# Patient Record
Sex: Female | Born: 1935 | Race: Black or African American | Hispanic: No | Smoking: Never smoker
Health system: Southern US, Community
[De-identification: ages and names within clinical notes are randomized; demographics above are authoritative.]

## PROBLEM LIST (undated history)

## (undated) DIAGNOSIS — H0011 Chalazion right upper eyelid: Secondary | ICD-10-CM

## (undated) DIAGNOSIS — I509 Heart failure, unspecified: Secondary | ICD-10-CM

## (undated) DIAGNOSIS — I2510 Atherosclerotic heart disease of native coronary artery without angina pectoris: Secondary | ICD-10-CM

## (undated) DIAGNOSIS — K3189 Other diseases of stomach and duodenum: Secondary | ICD-10-CM

## (undated) DIAGNOSIS — I10 Essential (primary) hypertension: Secondary | ICD-10-CM

## (undated) DIAGNOSIS — M1990 Unspecified osteoarthritis, unspecified site: Secondary | ICD-10-CM

## (undated) DIAGNOSIS — R5383 Other fatigue: Secondary | ICD-10-CM

## (undated) DIAGNOSIS — K219 Gastro-esophageal reflux disease without esophagitis: Secondary | ICD-10-CM

## (undated) DIAGNOSIS — E119 Type 2 diabetes mellitus without complications: Secondary | ICD-10-CM

## (undated) DIAGNOSIS — E785 Hyperlipidemia, unspecified: Secondary | ICD-10-CM

## (undated) DIAGNOSIS — D649 Anemia, unspecified: Secondary | ICD-10-CM

## (undated) DIAGNOSIS — E079 Disorder of thyroid, unspecified: Secondary | ICD-10-CM

## (undated) DIAGNOSIS — I350 Nonrheumatic aortic (valve) stenosis: Secondary | ICD-10-CM

## (undated) DIAGNOSIS — F419 Anxiety disorder, unspecified: Secondary | ICD-10-CM

## (undated) DIAGNOSIS — K469 Unspecified abdominal hernia without obstruction or gangrene: Secondary | ICD-10-CM

## (undated) DIAGNOSIS — F0390 Unspecified dementia without behavioral disturbance: Secondary | ICD-10-CM

## (undated) DIAGNOSIS — IMO0001 Reserved for inherently not codable concepts without codable children: Secondary | ICD-10-CM

## (undated) DIAGNOSIS — R0600 Dyspnea, unspecified: Secondary | ICD-10-CM

## (undated) DIAGNOSIS — R011 Cardiac murmur, unspecified: Secondary | ICD-10-CM

## (undated) HISTORY — PX: KNEE SURGERY: SHX244

## (undated) HISTORY — DX: Essential (primary) hypertension: I10

## (undated) HISTORY — DX: Gastro-esophageal reflux disease without esophagitis: K21.9

## (undated) HISTORY — DX: Unspecified osteoarthritis, unspecified site: M19.90

## (undated) HISTORY — PX: OTHER SURGICAL HISTORY: SHX169

## (undated) HISTORY — DX: Atherosclerotic heart disease of native coronary artery without angina pectoris: I25.10

## (undated) HISTORY — DX: Disorder of thyroid, unspecified: E07.9

## (undated) HISTORY — DX: Hyperlipidemia, unspecified: E78.5

## (undated) HISTORY — DX: Other diseases of stomach and duodenum: K31.89

## (undated) HISTORY — DX: Type 2 diabetes mellitus without complications: E11.9

## (undated) HISTORY — DX: Unspecified abdominal hernia without obstruction or gangrene: K46.9

## (undated) HISTORY — DX: Anxiety disorder, unspecified: F41.9

## (undated) HISTORY — PX: AORTIC VALVE SURGERY: SHX549

## (undated) HISTORY — PX: CATARACT EXTRACTION: SUR2

## (undated) HISTORY — PX: TUBAL LIGATION: SHX77

## (undated) HISTORY — DX: Nonrheumatic aortic (valve) stenosis: I35.0

## (undated) HISTORY — DX: Anemia, unspecified: D64.9

## (undated) HISTORY — PX: CORONARY ARTERY BYPASS GRAFT: SHX141

---

## 1992-01-29 ENCOUNTER — Encounter: Payer: Self-pay | Admitting: Internal Medicine

## 1992-02-28 ENCOUNTER — Encounter: Payer: Self-pay | Admitting: Internal Medicine

## 1995-12-26 ENCOUNTER — Encounter: Payer: Self-pay | Admitting: Internal Medicine

## 1996-03-15 ENCOUNTER — Encounter: Payer: Self-pay | Admitting: Internal Medicine

## 1996-05-31 ENCOUNTER — Encounter: Payer: Self-pay | Admitting: Internal Medicine

## 1996-08-15 ENCOUNTER — Encounter: Payer: Self-pay | Admitting: Internal Medicine

## 1998-07-09 ENCOUNTER — Other Ambulatory Visit: Admission: RE | Admit: 1998-07-09 | Discharge: 1998-07-09 | Payer: Self-pay | Admitting: *Deleted

## 1998-08-04 ENCOUNTER — Encounter: Payer: Self-pay | Admitting: Internal Medicine

## 1999-10-18 ENCOUNTER — Encounter: Payer: Self-pay | Admitting: Internal Medicine

## 1999-10-22 ENCOUNTER — Encounter: Payer: Self-pay | Admitting: Internal Medicine

## 2000-07-06 ENCOUNTER — Other Ambulatory Visit: Admission: RE | Admit: 2000-07-06 | Discharge: 2000-07-06 | Payer: Self-pay | Admitting: Obstetrics and Gynecology

## 2001-09-10 ENCOUNTER — Encounter: Payer: Self-pay | Admitting: Family Medicine

## 2001-09-10 ENCOUNTER — Ambulatory Visit (HOSPITAL_COMMUNITY): Admission: RE | Admit: 2001-09-10 | Discharge: 2001-09-10 | Payer: Self-pay | Admitting: Family Medicine

## 2001-10-17 ENCOUNTER — Ambulatory Visit (HOSPITAL_COMMUNITY): Admission: RE | Admit: 2001-10-17 | Discharge: 2001-10-17 | Payer: Self-pay | Admitting: Family Medicine

## 2001-10-17 ENCOUNTER — Encounter: Payer: Self-pay | Admitting: Family Medicine

## 2002-05-27 ENCOUNTER — Encounter: Payer: Self-pay | Admitting: Cardiothoracic Surgery

## 2002-05-29 ENCOUNTER — Encounter: Payer: Self-pay | Admitting: Cardiothoracic Surgery

## 2002-05-29 ENCOUNTER — Inpatient Hospital Stay (HOSPITAL_COMMUNITY): Admission: RE | Admit: 2002-05-29 | Discharge: 2002-06-05 | Payer: Self-pay | Admitting: Cardiothoracic Surgery

## 2002-05-30 ENCOUNTER — Encounter: Payer: Self-pay | Admitting: Cardiothoracic Surgery

## 2002-05-31 ENCOUNTER — Encounter: Payer: Self-pay | Admitting: Cardiothoracic Surgery

## 2002-06-20 ENCOUNTER — Encounter: Payer: Self-pay | Admitting: Cardiothoracic Surgery

## 2002-06-20 ENCOUNTER — Encounter: Admission: RE | Admit: 2002-06-20 | Discharge: 2002-06-20 | Payer: Self-pay | Admitting: Cardiothoracic Surgery

## 2002-08-13 ENCOUNTER — Encounter (HOSPITAL_COMMUNITY): Admission: RE | Admit: 2002-08-13 | Discharge: 2002-09-12 | Payer: Self-pay | Admitting: *Deleted

## 2002-09-16 ENCOUNTER — Encounter (HOSPITAL_COMMUNITY): Admission: RE | Admit: 2002-09-16 | Discharge: 2002-10-16 | Payer: Self-pay | Admitting: *Deleted

## 2002-10-21 ENCOUNTER — Encounter (HOSPITAL_COMMUNITY): Admission: RE | Admit: 2002-10-21 | Discharge: 2002-11-20 | Payer: Self-pay | Admitting: *Deleted

## 2002-11-07 ENCOUNTER — Ambulatory Visit (HOSPITAL_COMMUNITY): Admission: RE | Admit: 2002-11-07 | Discharge: 2002-11-07 | Payer: Self-pay | Admitting: Family Medicine

## 2002-11-07 ENCOUNTER — Encounter: Payer: Self-pay | Admitting: Family Medicine

## 2002-11-22 ENCOUNTER — Encounter (HOSPITAL_COMMUNITY): Admission: RE | Admit: 2002-11-22 | Discharge: 2002-12-22 | Payer: Self-pay | Admitting: *Deleted

## 2003-12-01 ENCOUNTER — Encounter: Payer: Self-pay | Admitting: Internal Medicine

## 2004-01-02 ENCOUNTER — Encounter: Payer: Self-pay | Admitting: Internal Medicine

## 2004-01-14 ENCOUNTER — Encounter (INDEPENDENT_AMBULATORY_CARE_PROVIDER_SITE_OTHER): Payer: Self-pay | Admitting: *Deleted

## 2004-01-14 ENCOUNTER — Encounter: Payer: Self-pay | Admitting: Internal Medicine

## 2004-02-04 ENCOUNTER — Encounter (INDEPENDENT_AMBULATORY_CARE_PROVIDER_SITE_OTHER): Payer: Self-pay | Admitting: *Deleted

## 2004-02-04 ENCOUNTER — Ambulatory Visit (HOSPITAL_COMMUNITY): Admission: RE | Admit: 2004-02-04 | Discharge: 2004-02-04 | Payer: Self-pay | Admitting: Internal Medicine

## 2004-03-26 ENCOUNTER — Other Ambulatory Visit: Admission: RE | Admit: 2004-03-26 | Discharge: 2004-03-26 | Payer: Self-pay | Admitting: Obstetrics and Gynecology

## 2004-05-05 ENCOUNTER — Encounter (INDEPENDENT_AMBULATORY_CARE_PROVIDER_SITE_OTHER): Payer: Self-pay | Admitting: *Deleted

## 2004-05-05 ENCOUNTER — Ambulatory Visit (HOSPITAL_COMMUNITY): Admission: RE | Admit: 2004-05-05 | Discharge: 2004-05-05 | Payer: Self-pay | Admitting: Internal Medicine

## 2004-09-14 ENCOUNTER — Emergency Department (HOSPITAL_COMMUNITY): Admission: EM | Admit: 2004-09-14 | Discharge: 2004-09-14 | Payer: Self-pay | Admitting: Emergency Medicine

## 2005-04-21 ENCOUNTER — Ambulatory Visit (HOSPITAL_COMMUNITY): Admission: RE | Admit: 2005-04-21 | Discharge: 2005-04-21 | Payer: Self-pay | Admitting: Family Medicine

## 2006-06-28 ENCOUNTER — Encounter: Payer: Self-pay | Admitting: Internal Medicine

## 2006-06-28 ENCOUNTER — Ambulatory Visit: Payer: Self-pay | Admitting: Internal Medicine

## 2006-12-10 ENCOUNTER — Emergency Department (HOSPITAL_COMMUNITY): Admission: EM | Admit: 2006-12-10 | Discharge: 2006-12-10 | Payer: Self-pay | Admitting: Emergency Medicine

## 2007-02-14 ENCOUNTER — Emergency Department (HOSPITAL_COMMUNITY): Admission: EM | Admit: 2007-02-14 | Discharge: 2007-02-14 | Payer: Self-pay | Admitting: Emergency Medicine

## 2007-05-15 ENCOUNTER — Emergency Department (HOSPITAL_COMMUNITY): Admission: EM | Admit: 2007-05-15 | Discharge: 2007-05-16 | Payer: Self-pay | Admitting: Emergency Medicine

## 2007-05-24 ENCOUNTER — Emergency Department (HOSPITAL_COMMUNITY): Admission: EM | Admit: 2007-05-24 | Discharge: 2007-05-24 | Payer: Self-pay | Admitting: Emergency Medicine

## 2007-05-26 ENCOUNTER — Ambulatory Visit (HOSPITAL_COMMUNITY): Admission: RE | Admit: 2007-05-26 | Discharge: 2007-05-26 | Payer: Self-pay | Admitting: Emergency Medicine

## 2007-11-15 ENCOUNTER — Encounter: Admission: RE | Admit: 2007-11-15 | Discharge: 2007-12-05 | Payer: Self-pay | Admitting: Neurology

## 2007-12-11 ENCOUNTER — Encounter: Admission: RE | Admit: 2007-12-11 | Discharge: 2007-12-27 | Payer: Self-pay | Admitting: Neurology

## 2007-12-11 ENCOUNTER — Ambulatory Visit (HOSPITAL_COMMUNITY): Admission: RE | Admit: 2007-12-11 | Discharge: 2007-12-11 | Payer: Self-pay | Admitting: Family Medicine

## 2008-07-14 ENCOUNTER — Ambulatory Visit (HOSPITAL_COMMUNITY): Admission: RE | Admit: 2008-07-14 | Discharge: 2008-07-14 | Payer: Self-pay | Admitting: *Deleted

## 2009-06-20 ENCOUNTER — Emergency Department (HOSPITAL_COMMUNITY): Admission: EM | Admit: 2009-06-20 | Discharge: 2009-06-20 | Payer: Self-pay | Admitting: Emergency Medicine

## 2009-12-22 ENCOUNTER — Encounter (INDEPENDENT_AMBULATORY_CARE_PROVIDER_SITE_OTHER): Payer: Self-pay | Admitting: *Deleted

## 2009-12-22 ENCOUNTER — Ambulatory Visit (HOSPITAL_COMMUNITY): Admission: RE | Admit: 2009-12-22 | Discharge: 2009-12-22 | Payer: Self-pay | Admitting: Cardiology

## 2010-01-08 ENCOUNTER — Encounter (INDEPENDENT_AMBULATORY_CARE_PROVIDER_SITE_OTHER): Payer: Self-pay | Admitting: *Deleted

## 2010-02-25 DIAGNOSIS — K449 Diaphragmatic hernia without obstruction or gangrene: Secondary | ICD-10-CM | POA: Insufficient documentation

## 2010-02-25 DIAGNOSIS — E049 Nontoxic goiter, unspecified: Secondary | ICD-10-CM | POA: Insufficient documentation

## 2010-02-25 DIAGNOSIS — D649 Anemia, unspecified: Secondary | ICD-10-CM

## 2010-02-25 DIAGNOSIS — I2510 Atherosclerotic heart disease of native coronary artery without angina pectoris: Secondary | ICD-10-CM

## 2010-02-25 DIAGNOSIS — M1990 Unspecified osteoarthritis, unspecified site: Secondary | ICD-10-CM | POA: Insufficient documentation

## 2010-02-25 DIAGNOSIS — E785 Hyperlipidemia, unspecified: Secondary | ICD-10-CM | POA: Insufficient documentation

## 2010-02-25 DIAGNOSIS — F411 Generalized anxiety disorder: Secondary | ICD-10-CM

## 2010-02-25 DIAGNOSIS — Z8711 Personal history of peptic ulcer disease: Secondary | ICD-10-CM

## 2010-02-25 DIAGNOSIS — I10 Essential (primary) hypertension: Secondary | ICD-10-CM | POA: Insufficient documentation

## 2010-02-25 DIAGNOSIS — E1165 Type 2 diabetes mellitus with hyperglycemia: Secondary | ICD-10-CM

## 2010-02-25 DIAGNOSIS — K210 Gastro-esophageal reflux disease with esophagitis: Secondary | ICD-10-CM

## 2010-03-04 ENCOUNTER — Ambulatory Visit: Payer: Self-pay | Admitting: Internal Medicine

## 2010-03-04 DIAGNOSIS — I509 Heart failure, unspecified: Secondary | ICD-10-CM

## 2010-03-04 DIAGNOSIS — Z860100 Personal history of colon polyps, unspecified: Secondary | ICD-10-CM | POA: Insufficient documentation

## 2010-03-04 HISTORY — DX: Heart failure, unspecified: I50.9

## 2010-03-11 ENCOUNTER — Ambulatory Visit: Payer: Self-pay | Admitting: Internal Medicine

## 2010-03-15 ENCOUNTER — Encounter: Payer: Self-pay | Admitting: Internal Medicine

## 2010-04-06 ENCOUNTER — Ambulatory Visit (HOSPITAL_COMMUNITY): Admission: RE | Admit: 2010-04-06 | Discharge: 2010-04-06 | Payer: Self-pay | Admitting: Cardiology

## 2010-04-19 ENCOUNTER — Ambulatory Visit: Payer: Self-pay | Admitting: Internal Medicine

## 2010-04-20 ENCOUNTER — Other Ambulatory Visit: Admission: RE | Admit: 2010-04-20 | Discharge: 2010-04-20 | Payer: Self-pay | Admitting: Interventional Radiology

## 2010-04-20 ENCOUNTER — Encounter: Admission: RE | Admit: 2010-04-20 | Discharge: 2010-04-20 | Payer: Self-pay | Admitting: Family Medicine

## 2010-09-06 ENCOUNTER — Encounter: Payer: Self-pay | Admitting: Internal Medicine

## 2011-01-06 NOTE — Letter (Signed)
Summary: Diabetic Instructions  [facility]  [place], [place] [address]   Phone: [phone]  Fax: [phone]    Katherine [name] November 23, 1928 MRN: [id number]   _x  _   ORAL DIABETIC MEDICATION INSTRUCTIONS  The day before your procedure:   Take your diabetic pill as you do normally  The day of your procedure:   Do not take your diabetic pill (metformin)   We will check your blood sugar levels during the admission process and again in Recovery before discharging you home  ________________________________________________________________________  _ x _   INSULIN (LONG ACTING) MEDICATION INSTRUCTIONS (Lantus, NPH, 70/30, Humulin, Novolin-N)   The day before your procedure:   Take  your regular evening dose    The day of your procedure:   Do not take your morning dose

## 2011-01-06 NOTE — Op Note (Signed)
Summary: EGD/COLON (Dr Laural Golden)   NAME:  Katherine [name], Katherine [name]                   ACCOUNT NO.:  [id number]   MEDICAL RECORD NO.:  [id number]                   PATIENT TYPE:  AMB   LOCATION:  DAY                                  FACILITY:  [facility]   PHYSICIAN:  Hildred Laser, M.D.                 DATE OF BIRTH:  [birth year]   DATE OF PROCEDURE:  02/04/2004  DATE OF DISCHARGE:                                 OPERATIVE REPORT   PROCEDURE:  Esophagogastroduodenoscopy followed by total colonoscopy.   INDICATIONS:  Katherine [name] is a 77 year old African American female who  recently developed melena.  Her hemoglobin was 9.3.  She had been __________  which was discontinued.  It is suspected she may have peptic ulcer disease  and, therefore, undergoing diagnostic EGD.  She is also undergoing  colonoscopy primarily for screening purposes.  The procedure and risks were  reviewed with the patient and informed consent was obtained.   PREMEDICATION:  Demerol 50 mg IV,  Versed 6 mg IV.   FINDINGS:  The procedures were performed in the endoscopy suite.  The  patient's vital signs and O2 saturation were monitored during the procedure  and remained stable.   ESOPHAGOGASTRODUODENOSCOPY:  The patient was placed in the left lateral  recumbent position and Olympus videoscope was passed d through the  oropharynx without any difficulty into the esophagus.   Esophagus:  Mucosa of the esophagus was normal throughout.  The GE junction  was unremarkable.  There was a 3 cm size sliding hiatal hernia.   Stomach:  It was empty and distended very well on insufflation.  Folds of  the proximal stomach were normal.  Examination of the mucosa revealed patchy  erythema.  In the antrum there was a deep prepyloric/antral ulcer along the  anterior wall measuring 6-7 mm.  It had a clean base.  Mucosa around it was  very swollen.  Biopsy was taken from its margin although approach was very  difficult.  Pyloric channel was  patent.  Angularis, fundus and cardia were  examined by retroflexing the scope.  There was a round 3 cm size submucosal  lesion at the fundus.  Mucosa, however, was normal.  Endoscopically this  suspicious for a leiomyoma.  I could not retrieve the pictures from August  1999.  My visual impression was this was slightly increased in size.   Duodenum:  Examination of the bulb and second part of the duodenum was  normal.   The endoscope was withdrawn and the patient was prepared for procedure #2.       TOTAL COLONOSCOPY:  Rectal examination performed.  No abnormality noted on  the external or digital exam.  Olympus videoscope was placed in the rectum  and advanced under vision into the sigmoid colon and beyond.  Preparation  was satisfactory.  Scope was passed into the cecum which was identified by  the appendiceal orifice and ileocecal valve.  Pictures were taken for the  record.  As the scope was withdrawn, colonic mucosa was once again carefully  examined and was normal throughout.  Rectal mucosa similarly was normal.  Scope was retroflexed, examined.  The anorectal junction was unremarkable.  Endoscope was straightened and withdrawn.  The patient tolerated the  procedure well.   FINAL DIAGNOSIS:  1. Gastric ulcer at antrum.  It was felt to be the source of recent     gastrointestinal bleed.  Biopsy taken from ulcer margin.  2. A 3 cm size submucosal lesion at gastric fundus consistent with a     leiomyoma.  Size may have slightly increased since previous EGD of August     1999.  Pictures/records have been requested.  3. Small sliding hiatal hernia.  4. Normal colonoscopy.   RECOMMENDATIONS:  1. She will resume her ferrous sulfate at 325 mg p.o. b.i.d.  2. Helicobacter pylori serology and hemoglobin and hematocrit will be     checked today.  She will continue Prevacid.  She can stay on her low-dose     aspirin but should not take any NSAIDs, at least for now.  3. She will  return for followup EGD in about 10 weeks, presuming biopsy is     benign.      ___________________________________________                                            Hildred Laser, M.D.   NR/MEDQ  D:  02/04/2004  T:  02/04/2004  Job:  [id number]   cc:   Angus G. Everette Rank, M.D.  [address]  [place]  [place] [address]  Fax: [phone]  This report was created from the original endoscopy report, which was reviewed and signed by the above listed endoscopist.   Appended Document: EGD/COLON (Dr Laural Golden) Although the prior note indicates that there was a biopsy taken from the ulcer margin, NO PATHOLOGY WAS ABLE TO BE LOCATED BY [facility] AS OF 02/25/10

## 2011-01-06 NOTE — Procedures (Signed)
Summary: EGD/[facility]  EGD/[facility]   Imported By: Phillis Knack 03/03/2010 10:43:42  _____________________________________________________________________  External Attachment:    Type:   Image     Comment:   External Document

## 2011-01-06 NOTE — Procedures (Signed)
Summary: Flexible Sigmoidoscopy/[facility]  Flexible Sigmoidoscopy/[facility]   Imported By: Phillis Knack 03/03/2010 10:40:14  _____________________________________________________________________  External Attachment:    Type:   Image     Comment:   External Document

## 2011-01-06 NOTE — Letter (Signed)
Summary: Rogene Houston MD  Rogene Houston MD   Imported By: Phillis Knack 03/03/2010 10:48:11  _____________________________________________________________________  External Attachment:    Type:   Image     Comment:   External Document

## 2011-01-06 NOTE — Letter (Signed)
Summary: Patient [facility] Biopsy Results  [facility]  [place], [place] [address]   Phone: [phone]  Fax: [phone]        March 15, 2010 MRN: [id number]    MILANA [address]IL[address] [place], [place]  [address]    Dear Ms. Tieszen,  I am pleased to inform you that the biopsies taken during your recent endoscopic examination did not show any evidence of cancer upon pathologic examination.The biopsies show chronic inflammation.  Additional information/recommendations:  __No further action is needed at this time.  Please follow-up with      your primary care physician for your other healthcare needs.  __ Please call [phone] to schedule a return visit to review      your condition.  _x_ Continue with the treatment plan as outlined on the day of your      exam.  __   Please call us if you are having persistent problems or have questions about your condition that have not been fully answered at this time.  Sincerely,  Lafayette Dragon MD  This letter has been electronically signed by your physician.  Appended Document: Patient Notice-Endo Biopsy Results letter mailed 04.13.11

## 2011-01-06 NOTE — Op Note (Signed)
Summary: EGD (Dr Laural Golden)   NAME:  Katherine [name], Katherine [name]                   ACCOUNT NO.:  [id number]   MEDICAL RECORD NO.:  [id number]                   PATIENT TYPE:  AMB   LOCATION:  DAY                                  FACILITY:  [facility]   PHYSICIAN:  Hildred Laser, M.D.                 DATE OF BIRTH:  January 05, 1928   DATE OF PROCEDURE:  05/05/2004  DATE OF DISCHARGE:                                 OPERATIVE REPORT   PROCEDURE PERFORMED:  Esophagogastroduodenoscopy.   INDICATIONS FOR PROCEDURE:  Katherine [name] is a 77 year old African-American  female who presented with gastrointestinal bleeding three months ago and was  found to have a gastric ulcer.  She has been treated for Helicobacter pylori  infection.  She is back on a low dose ASA and is on Prevacid.  She is  undergoing EGD to document complete healing of this ulcer.  She is presently  asymptomatic.  The procedure was reviewed with the patient and informed  consent was obtained.   PREOP MEDICATIONS:  Cetacaine spray for pharyngeal topical anesthesia.  Demerol 25 mg IV, Versed 4 mg in divided dose.   DESCRIPTION OF PROCEDURE:  Procedure performed in endoscopy suite.  Patient's vital signs and oxygen saturations were monitored during the  procedure and remained stable.  The patient was placed in left lateral  position and Olympus video scope was passed via oropharynx without any  difficulty into the esophagus.   Esophagus:  Mucosa of the esophagus normal.  Squamocolumnar junction was  unremarkable.  There was a 3 cm size sliding hiatal hernia.   Stomach:  It was empty and distended very well with insufflation.  There was  a large, i.e. 3 cm submucosal mass in the proximal gastric body which was  also seen on the previous study.  It had smooth mucosa over it.  Endoscopically this was felt to be a leiomyoma but may be a lipoma.  Examination of the antrum revealed some patchy erythema with two scars.  Ulcer was felt to be  completely healed.  Pyloric exam was patent.  Angularis, fundus and cardia were examined by retroflexion of scope and were  normal.   Duodenum:  Examination of the bulb and postbulbar mucosa was normal.  Endoscope was withdrawn.  The patient tolerated the procedure well.   FINAL DIAGNOSIS:  Gastric ulcer has completely healed.  Patchy antral  erythema.   A large submucosal lesion at the proximal gastric body which is unchanged  since the previous study of three months ago but was initially seen in 1999.  This is possibly a leiomyoma but could be a lipoma.   A small sliding hiatal hernia.   RECOMMENDATIONS:  She should continue proton pump inhibitors as long as she  is on ASA.  The patient is advised not to take any over-the-counter NSAIDs.      ___________________________________________  Hildred Laser, M.D.   NR/MEDQ  D:  05/05/2004  T:  05/06/2004  Job:  [id number]   cc:   Angus G. Everette Rank, M.D.  [address]  [place]  [place] [address]  Fax: [phone]

## 2011-01-06 NOTE — Letter (Signed)
Summary: New Patient letter  [name]  [address] [place], [place] [address]   Phone: [phone]  Fax: [phone]       01/08/2010 MRN: [id number]  Katherine [name] [place], [place]  [address]  Dear Katherine [name],  Welcome to the Gastroenterology Division at [facility].    You are scheduled to see Dr.  Olevia Perches on 03/04/2010 at 10:30AM on the 3rd floor at [facility], [place] [employer].  We ask that you try to arrive at our office 15 minutes prior to your appointment time to allow for check-in.  We would like you to complete the enclosed self-administered evaluation form prior to your visit and bring it with you on the day of your appointment.  We will review it with you.  Also, please bring a complete list of all your medications or, if you prefer, bring the medication bottles and we will list them.  Please bring your insurance card so that we may make a copy of it.  If your insurance requires a referral to see a specialist, please bring your referral form from your primary care physician.  Co-payments are due at the time of your visit and may be paid by cash, check or credit card.     Your office visit will consist of a consult with your physician (includes a physical exam), any laboratory testing he/she may order, scheduling of any necessary diagnostic testing (e.g. x-ray, ultrasound, CT-scan), and scheduling of a procedure (e.g. Endoscopy, Colonoscopy) if required.  Please allow enough time on your schedule to allow for any/all of these possibilities.    If you cannot keep your appointment, please call [phone] to cancel or reschedule prior to your appointment date.  This allows [place] the opportunity to schedule an appointment for another patient in need of care.  If you do not cancel or reschedule by 5 p.m. the business day prior to your appointment date, you will be charged a $50.00 late cancellation/no-show fee.    Thank you for  choosing [facility] for your medical needs.  We appreciate the opportunity to care for you.  Please visit [place] at our website  to learn more about our practice.                     Sincerely,                                                             The Gastroenterology Division

## 2011-01-06 NOTE — Procedures (Signed)
Summary: Upper Endoscopy  Patient: [name] Note: All result statuses are Final unless otherwise noted.  Tests: (1) Upper Endoscopy (EGD)   EGD Upper Endoscopy       [facility] [employer]     [place], [place]  [address]           ENDOSCOPY PROCEDURE REPORT           PATIENT:  Katherine [name], Katherine [name]  MR#:  [id number]     BIRTHDATE:  1928/05/16, 82 yrs. old  GENDER:  female           ENDOSCOPIST:  Lowella Bandy. Olevia Perches, MD     Referred by:  [name], M.D.           PROCEDURE DATE:  03/11/2010     PROCEDURE:  EGD with biopsy     ASA CLASS:  Class II     INDICATIONS:  abdominal pain mass in the stomach on CT scan, hx of     a large leiomyoma on EGD 1993, has grown in size fom 25mm to     3.1x2.9x3.2 cm.     hx of antral ulcer on EGD 2005, bleeding           MEDICATIONS:   Versed 5 mg, Fentanyl 50 mcg     TOPICAL ANESTHETIC:  Exactacain Spray           DESCRIPTION OF PROCEDURE:   After the risks benefits and     alternatives of the procedure were thoroughly explained, informed     consent was obtained.  The LB GIF-H180 [id number] endoscope was     introduced through the mouth and advanced to the second portion of     the duodenum, without limitations.  The instrument was slowly     withdrawn as the mucosa was fully examined.     <<PROCEDUREIMAGES>>           A mass was found. lrge submucosal mass 5 cm distal to the g-e     junction, c/w leiomyoma, about 3 cm in diam With standard forceps,     a biopsy was obtained and sent to pathology (see image2, image7,     image8, image9, and image10).  Moderate gastritis was found.     several antral erosions With standard forceps, a biopsy was     obtained and sent to pathology (see image5 and image3).  A hiatal     hernia was found (see image1 and image2). 2 cm slidinf hernia  The     duodenal bulb was normal in appearance, as was the postbulbar     duodenum (see image4).    Retroflexed views revealed no    abnormalities.    The scope was then withdrawn from the patient     and the procedure completed.           COMPLICATIONS:  None           ENDOSCOPIC IMPRESSION:     1) Mass     2) Moderate gastritis     3) Hiatal hernia     4) Normal duodenum     suspected leiomyoma of the proximal stomach is not caising     abdominal pain, although it may cotribute to GERD     RECOMMENDATIONS:     1) Await pathology results     continue Pantoprazole 40 mg qd     at her age, would  be hesitant to resect the mass unless it     bleeds acutely           REPEAT EXAM:  In 0 year(s) for.           ______________________________     Lowella Bandy. Olevia Perches, MD           CC:           n.     eSIGNED:   Lowella Bandy. Brodie at 03/11/2010 02:18 PM           Page 2 of 3   Shyra, [place], [id number]  Note: An exclamation mark (!) indicates a result that was not dispersed into the flowsheet. Document Creation Date: 03/11/2010 2:19 PM _______________________________________________________________________  (1) Order result status: Final Collection or observation date-time: 03/11/2010 13:53 Requested date-time:  Receipt date-time:  Reported date-time:  Referring Physician:   Ordering Physician: [name] [phone]) Specimen Source:  Source: Tawanna Cooler Order Number: [id number] Lab site:

## 2011-01-06 NOTE — Procedures (Signed)
Summary: EGD/[facility]  EGD/[facility]   Imported By: Phillis Knack 03/03/2010 10:55:05  _____________________________________________________________________  External Attachment:    Type:   Image     Comment:   External Document

## 2011-01-06 NOTE — Letter (Signed)
Summary: [facility] Instructions  [facility]  [place], [place] [address]   Phone: [phone]  Fax: [phone]       Katherine [name]    06-Jul-1928    MRN: [id number]       Procedure Day /Date: 03/11/10 Thursday     Arrival Time: 12:30 pm     Procedure Time: 1:30 pm     Location of Procedure:                    _x _  [facility] (4th Floor)  [place]  Starting 5 days prior to your procedure (03/06/10) do not eat nuts, seeds, popcorn, corn, beans, peas,  salads, or any raw vegetables.  Do not take any fiber supplements (e.g. Metamucil, Citrucel, and Benefiber). ____________________________________________________________________________________________________   THE DAY BEFORE YOUR PROCEDURE         DATE:  03/10/10 DAY: Wednesday  1   Drink clear liquids the entire day-NO SOLID FOOD  2   Do not drink anything colored red or purple.  Avoid juices with pulp.  No orange juice.  3   Drink at least 64 oz. (8 glasses) of fluid/clear liquids during the day to prevent dehydration and help the prep work efficiently.  CLEAR LIQUIDS INCLUDE: Water Jello Ice Popsicles Tea (sugar ok, no milk/cream) Powdered fruit flavored drinks Coffee (sugar ok, no milk/cream) Gatorade Juice: apple, white grape, white cranberry  Lemonade Clear bullion, consomm, broth Carbonated beverages (any kind) Strained chicken noodle soup Hard Candy  4   Mix the entire bottle of Miralax with 64 oz. of Gatorade/Powerade in the morning and put in the refrigerator to chill.  5   At 3:00 pm take 2 Dulcolax/Bisacodyl tablets.  6   At 4:30 pm take one Reglan/Metoclopramide tablet.  7  Starting at 5:00 pm drink one 8 oz glass of the Miralax mixture every 15-20 minutes until you have finished drinking the entire 64 oz.  You should finish drinking prep around 7:30 or 8:00 pm.  8   If you are nauseated, you may take the 2nd  Reglan/Metoclopramide tablet at 6:30 pm.        9    At 8:00 pm take 2 more DULCOLAX/Bisacodyl tablets.         THE DAY OF YOUR PROCEDURE      DATE:  03/11/10 DAY: Thursday  You may drink clear liquids until 11:30 am  (2 HOURS BEFORE PROCEDURE).   MEDICATION INSTRUCTIONS  Unless otherwise instructed, you should take regular prescription medications with a small sip of water as early as possible the morning of your procedure.  Diabetic patients - see separate instructions.         OTHER INSTRUCTIONS  You will need a responsible adult at least 75 years of age to accompany you and drive you home.   This person must remain in the waiting room during your procedure.  Wear loose fitting clothing that is easily removed.  Leave jewelry and other valuables at home.  However, you may wish to bring a book to read or an iPod/MP3 player to listen to music as you wait for your procedure to start.  Remove all body piercing jewelry and leave at home.  Total time from sign-in until discharge is approximately 2-3 hours.  You should go home directly after your procedure and rest.  You can resume normal activities the day after your procedure.  The day of your  procedure you should not:   Drive   Make legal decisions   Operate machinery   Drink alcohol   Return to work  You will receive specific instructions about eating, activities and medications before you leave.   The above instructions have been reviewed and explained to me by [place] [name])  March 04, 2010 11:36 AM     I fully understand and can verbalize these instructions _____________________________ Date 03/03/10

## 2011-01-06 NOTE — Assessment & Plan Note (Signed)
Summary: stomach mass...as.   History of Present Illness Visit Type: Follow-up Visit Primary GI MD: [name] MD Requesting Provider: n/a Chief Complaint: Stomach mass, acid reflux, heartburn, chest pain, nausea and vomiting, constipation and diarrhea   History of Present Illness:   This is an 77 year old white female with a submucosal lesion in the proximal stomach found in 1993 on an upper endoscopy byDr Rehman. She followed up over the years until 2005. Patient is here for another opinion because her daughter lives in [place] and Dr Laural Golden moved to [place], [place]. She is complaining of epigastric pain as well as left lower quadrant abdominal pain. She has diarrhea as well as constipation. An upper endoscopy in 1993 and again in 1997 showed a small sliding hiatal hernia and a 5 mm submucosal lesion at the lesser curvature of the stomach. A repeat endoscopy in 1999 showed lesion to be increased in size to 15-20 mm. Biopsies were negative for malignancy. Her lesion was consistent with leiomyoma. A barium swallow at that time confirmed the presence of submucosal lesions. She presented with an upper GI bleed in 2005 and was found to have a 6-7 mm antral ulcer. At that time, her submucosal lesion in the stomach was described as being 3 centimeters in diameter. A CT angiogram completed last week describes a larger lesion in the proximal stomach measuring 3.1x2.9 cm and 3.2 cm in length and patient has been on Protonix 40 mg daily. She is a diabetic on metformin which may explain her diarrhea. A colonoscopy in March 2005 was unremarkable. A flexible sigmoidoscopy in 1993 and again in 1997 were both negative.   GI Review of Systems    Reports acid reflux, chest pain, heartburn, nausea, and  vomiting.      Denies abdominal pain, belching, bloating, dysphagia with liquids, dysphagia with solids, loss of appetite, vomiting blood, weight loss, and  weight gain.      Reports change in bowel habits, constipation,  diarrhea, fecal incontinence, and  hemorrhoids.     Denies anal fissure, black tarry stools, diverticulosis, heme positive stool, irritable bowel syndrome, jaundice, light color stool, liver problems, rectal bleeding, and  rectal pain.    Current Medications (verified): 1)  Aspir-Low 81 Mg Tbec (Aspirin) .... One Tablet By Mouth Once Daily 2)  Levemir Flexpen 100 Unit/ml Soln (Insulin Detemir) .... 60 Units in The Morning 3)  Enalapril Maleate 20 Mg Tabs (Enalapril Maleate) .... One Tablet By Mouth Once Daily 4)  Potassium Chloride 20 Meq Pack (Potassium Chloride) .... One Tablet By Mouth Once Daily 5)  Amlodipine Besylate 5 Mg Tabs (Amlodipine Besylate) .... One Tablet By Mouth Once Daily 6)  Metformin Hcl 500 Mg Tabs (Metformin Hcl) .... One Tablet By Mouth Two Times A Day 7)  Metoprolol Tartrate 50 Mg Tabs (Metoprolol Tartrate) .... 1/2 Tablet By Mouth Once Daily 8)  Triamterene-Hctz 37.5-25 Mg Tabs (Triamterene-Hctz) .... One Tablet By Mouth Once Daily 9)  Tylenol Extra Strength 500 Mg Tabs (Acetaminophen) .... One Tablet By Mouth in The Morning and One Tablet By Mouth At Bedtime 10)  Vytorin 10-10 Mg Tabs (Ezetimibe-Simvastatin) .... One Tablet By Mouth Once Daily 11)  Meclizine Hcl 12.5 Mg Tabs (Meclizine Hcl) .... One Tablet By Mouth As Needed 12)  Promethazine Hcl 25 Mg Tabs (Promethazine Hcl) .... As Needed 13)  Pantoprazole Sodium 40 Mg Tbec (Pantoprazole Sodium) .... One Tablet By Mouth Once Daily 14)  Combivent 18-103 Mcg/act Aero (Ipratropium-Albuterol) .... As Directed  Allergies (verified): No Known  Drug Allergies  Past History:  Past Medical History: Reviewed history from 02/25/2010 and no changes required. Current Problems:  * GASTRIC MASS, UNKNOWN BEHAVIOR HIATAL HERNIA (ICD-553.3) GASTRIC ULCER, HX OF (ICD-V12.71) THYROID MASS (ICD-240.9) Hx of REFLUX ESOPHAGITIS (ICD-530.11) ANXIETY (ICD-300.00) CORONARY ARTERY DISEASE (ICD-414.00) OSTEOARTHRITIS  (ICD-715.90) Hx of UNSPECIFIED ANEMIA (ICD-285.9) HYPERTENSION (ICD-401.9) HYPERLIPIDEMIA (ICD-272.4) DIABETES MELLITUS, TYPE II (ICD-250.00)    Past Surgical History: Reviewed history from 02/25/2010 and no changes required. CABG x 4 in 2003 Tubal ligation Knee surgery Uterine polypectomy Internal hemorrhoidectomy  Family History: No FH of Colon Cancer: Family History of Diabetes: Mother, Father, 2 children  Social History: Occupation: Retired WIdowed 5 childern Alcohol Use - no Illicit Drug Use - no  Review of Systems       The patient complains of arthritis/joint pain, change in vision, fatigue, hearing problems, swelling of feet/legs, urine leakage, and vision changes.  The patient denies allergy/sinus, anemia, anxiety-new, back pain, blood in urine, breast changes/lumps, confusion, cough, coughing up blood, depression-new, fainting, fever, headaches-new, heart murmur, heart rhythm changes, itching, menstrual pain, muscle pains/cramps, night sweats, nosebleeds, pregnancy symptoms, shortness of breath, skin rash, sleeping problems, sore throat, swollen lymph glands, thirst - excessive , urination - excessive , urination changes/pain, and voice change.         Pertinent positive and negative review of systems were noted in the above HPI. All other ROS was otherwise negative.   Vital Signs:  Patient profile:   77 year old female Height:      62 inches Weight:      230 pounds BMI:     42.22 BSA:     2.03 Pulse rate:   76 / minute Pulse rhythm:   regular BP sitting:   132 / 84  (left arm) Cuff size:   regular  Vitals Entered By: Hope Pigeon CMA (March 04, 2010 10:48 AM)  Physical Exam  General:  alert, oriented, obese, moves very slowly due to arthritis. Eyes:  PERRLA, no icterus. Nose:  No deformity, discharge,  or lesions. Mouth:  No deformity or lesions, dentition normal. Neck:  Supple; no masses or thyromegaly. Lungs:  Clear throughout to auscultation. Heart:   Regular rate and rhythm; no murmurs, rubs,  or bruits. Abdomen:  protuberant and obese abdomen was soft bowel sounds and tenderness in the suprapubic area with a large fatty fold in the lower abdomen. No ascites. Epigastrium mildly uncomfortable. No palpable mass. Rectal:  Rectal exam showed soft Hemoccult negative. Skin:  Intact without significant lesions or rashes. Psych:  Alert and cooperative. Normal mood and affect.   Impression & Recommendations:  Problem # 1:  * GASTRIC MASS, UNKNOWN BEHAVIOR Patient has had a submucosal gastric mass since 1993 which has grown from her original size  of 0.5 mm on UGI  to a current 3.1 cm on her last CT scan. I still feel this is most likely a benign lesion but because of patient's symptoms and the fact that she had gastric ulcers in the past, we will go ahead with an upper endoscopy. Her last exam was completed 6 years ago. Orders: Colon/Endo (Colon/Endo)  Problem # 2:  COLONIC POLYPS, HX OF (ICD-V12.72) Patient was told that she had colon polyps in the past but my review of records from [facility] do not indicate any polyps. Orders: Colon/Endo (Colon/Endo)  Problem # 3:  GASTRIC ULCER, HX OF (ICD-V12.71) Patient has abdominal pain predominantly in the lower abdomen. She is Hemoccult-negative. She is due for a  colonoscopy at this time. Therefore, we will proceed with an upper and lower endoscopy. She continues on Protonix 40 mg daily. Orders: Colon/Endo (Colon/Endo)  Patient Instructions: 1)  upper endoscopy and biopsies for assessment of submucosal gastric mass. 2)  Consider endoscopic ultrasound to access the submucosal mass. 3)  Colonoscopy. 4)  Protonix 40 mg daily. 5)  Depending on the findings of the above, patient may have to decrease her metformin in orderto avoid concomitant diarrhea. 6)  Copy sent to :Dr Everette Rank  7)  The medication list was reviewed and reconciled.  All changed / newly prescribed medications were explained.  A complete  medication list was provided to the patient / caregiver. Prescriptions: DULCOLAX 5 MG  TBEC (BISACODYL) Day before procedure take 2 at 3pm and 2 at 8pm.  #4 x 0   Entered by:   Awilda Bill CMA ([place])   Authorized by:   Lafayette Dragon MD   Signed by:   Awilda Bill CMA ([place]) on 03/04/2010   Method used:   Electronically to        [facility]. [phone]* (retail)       [address], [place]  [address]       Ph: [phone]       Fax: [phone]   RxID:   [id number] REGLAN 10 MG  TABS (METOCLOPRAMIDE HCL) As per prep instructions.  #2 x 0   Entered by:   Awilda Bill CMA (AAMA)   Authorized by:   Lafayette Dragon MD   Signed by:   Awilda Bill CMA ([place]) on 03/04/2010   Method used:   Electronically to        [facility]. [phone]* (retail)       [address], [place]  [address]       Ph: [phone]       Fax: [phone]   RxID:   [id number] MIRALAX   POWD (POLYETHYLENE GLYCOL 3350) As per prep  instructions.  #255gm x 0   Entered by:   Awilda Bill CMA (AAMA)   Authorized by:   Lafayette Dragon MD   Signed by:   Awilda Bill CMA ([place]) on 03/04/2010   Method used:   Electronically to        [facility]. [phone]* (retail)       [address]   [address]       Ph: [phone]       Fax: [phone]   RxID:   [id number]

## 2011-01-06 NOTE — Letter (Signed)
Summary: Rogene Houston MD  Rogene Houston MD   Imported By: Phillis Knack 03/03/2010 10:46:54  _____________________________________________________________________  External Attachment:    Type:   Image     Comment:   External Document

## 2011-01-06 NOTE — Assessment & Plan Note (Signed)
Summary: follow up procedure/lk   History of Present Illness Visit Type: Follow-up Visit Primary GI MD: Katherine Edis MD Requesting Provider: n/a Chief Complaint: follow up after EGD and colonoscopy.  Pt states she is doing well, she will still have some left sided pain, that will go away after taking Levsin History of Present Illness:   This is an 77 year old African American female with abdominal pain. She is status post upper endoscopy and colonoscopy 4 weeks ago with findings of a 2 cm hiatal hernia and small submucosal gastric mass most likely asymptomatic which has been there for many years and appeared to be somewhat larger. She was put on Nexium 40 mg daily and Levsin sublingually with complete resolution of her symptoms. She was found to have internal hemorrhoids on a colonoscopy. There were no polyps. Patient is satisfied with her progress.   GI Review of Systems    Reports abdominal pain.     Location of  Abdominal pain: left side.    Denies acid reflux, belching, bloating, chest pain, dysphagia with liquids, dysphagia with solids, heartburn, loss of appetite, nausea, vomiting, vomiting blood, weight loss, and  weight gain.        Denies anal fissure, black tarry stools, change in bowel habit, constipation, diarrhea, diverticulosis, fecal incontinence, heme positive stool, hemorrhoids, irritable bowel syndrome, jaundice, light color stool, liver problems, rectal bleeding, and  rectal pain. Preventive Screening-Counseling & Management  Alcohol-Tobacco     Smoking Status: never    Current Medications (verified): 1)  Aspir-Low 81 Mg Tbec (Aspirin) .... One Tablet By Mouth Once Daily 2)  Levemir Flexpen 100 Unit/ml Soln (Insulin Detemir) .... 60 Units in The Morning 3)  Enalapril Maleate 20 Mg Tabs (Enalapril Maleate) .... One Tablet By Mouth Once Daily 4)  Potassium Chloride 20 Meq Pack (Potassium Chloride) .... One Tablet By Mouth Once Daily 5)  Amlodipine Besylate 5 Mg Tabs  (Amlodipine Besylate) .... One Tablet By Mouth Once Daily 6)  Metformin Hcl 500 Mg Tabs (Metformin Hcl) .... One Tablet By Mouth Two Times A Day 7)  Metoprolol Tartrate 50 Mg Tabs (Metoprolol Tartrate) .... 1/2 Tablet By Mouth Once Daily 8)  Triamterene-Hctz 37.5-25 Mg Tabs (Triamterene-Hctz) .... One Tablet By Mouth Once Daily 9)  Tylenol Extra Strength 500 Mg Tabs (Acetaminophen) .... One Tablet By Mouth in The Morning and One Tablet By Mouth At Bedtime 10)  Vytorin 10-10 Mg Tabs (Ezetimibe-Simvastatin) .... One Tablet By Mouth Once Daily 11)  Meclizine Hcl 12.5 Mg Tabs (Meclizine Hcl) .... One Tablet By Mouth As Needed 12)  Promethazine Hcl 25 Mg Tabs (Promethazine Hcl) .... As Needed 13)  Pantoprazole Sodium 40 Mg Tbec (Pantoprazole Sodium) .... One Tablet By Mouth Once Daily 14)  Combivent 18-103 Mcg/act Aero (Ipratropium-Albuterol) .... As Directed 15)  Levsin 0.125 Mg  Tabs (Hyoscyamine Sulfate) .Marland Kitchen.. 1 Sl Three Times A Day Prn  Allergies (verified): No Known Drug Allergies  Past History:  Past Medical History: Reviewed history from 02/25/2010 and no changes required. Current Problems:  * GASTRIC MASS, UNKNOWN BEHAVIOR HIATAL HERNIA (ICD-553.3) GASTRIC ULCER, HX OF (ICD-V12.71) THYROID MASS (ICD-240.9) Hx of REFLUX ESOPHAGITIS (ICD-530.11) ANXIETY (ICD-300.00) CORONARY ARTERY DISEASE (ICD-414.00) OSTEOARTHRITIS (ICD-715.90) Hx of UNSPECIFIED ANEMIA (ICD-285.9) HYPERTENSION (ICD-401.9) HYPERLIPIDEMIA (ICD-272.4) DIABETES MELLITUS, TYPE II (ICD-250.00)    Past Surgical History: Reviewed history from 02/25/2010 and no changes required. CABG x 4 in 2003 Tubal ligation Knee surgery Uterine polypectomy Internal hemorrhoidectomy  Family History: Reviewed history from 03/04/2010 and no changes required.  No FH of Colon Cancer: Family History of Diabetes: Mother, Father, 2 children  Social History: Occupation: Retired WIdowed 5 childern Alcohol Use - no Illicit Drug  Use - no Patient has never smoked.  Smoking Status:  never  Review of Systems  The patient denies allergy/sinus, anemia, anxiety-new, arthritis/joint pain, back pain, blood in urine, breast changes/lumps, confusion, cough, coughing up blood, depression-new, fainting, fatigue, fever, headaches-new, hearing problems, heart murmur, heart rhythm changes, itching, menstrual pain, muscle pains/cramps, night sweats, nosebleeds, pregnancy symptoms, shortness of breath, skin rash, sleeping problems, sore throat, swelling of feet/legs, swollen lymph glands, thirst - excessive, urination - excessive, urination changes/pain, urine leakage, vision changes, and voice change.         Pertinent positive and negative review of systems were noted in the above HPI. All other ROS was otherwise negative.   Vital Signs:  Patient profile:   77 year old female Height:      62 inches Weight:      226 pounds BMI:     41.49 Pulse rate:   80 / minute Pulse rhythm:   regular BP sitting:   130 / 74  (left arm) Cuff size:   large  Vitals Entered By: Abelino Derrick CMA [name]) (Apr 19, 2010 2:10 PM)  Physical Exam  General:  very pleasant, alert and oriented, obese, moves very slowly, difficult to up sit on the examining table. Eyes:  PERRLA, no icterus. Mouth:  No deformity or lesions, dentition normal. Neck:  Supple; no masses or thyromegaly. Lungs:  Clear throughout to auscultation. Heart:  Regular rate and rhythm; no murmurs, rubs,  or bruits. Abdomen:  obese abdomen soft nontender. Normoactive bowel sounds. No ascites no tympany. Extremities:  1+ edema. Skin:  Intact without significant lesions or rashes. Psych:  Alert and cooperative. Normal mood and affect.   Impression & Recommendations:  Problem # 1:  COLONIC POLYPS, HX OF (ICD-V12.72) There were no recurrent polyps on a recent colonoscopy in April 2011. There is no recall colonoscopy planned due to age.  Problem # 2:  * GASTRIC MASS, UNKNOWN  BEHAVIOR Patient has a benign-appearing submucosal antral mass, likely asymptomatic. There is no need for further evaluation. We will consider a followup CT scan of the abdomen in 2 years.  Problem # 3:  GASTRIC ULCER, HX OF (ICD-V12.71) Patient is status post recent upper endoscopy with findings of submucosal nausea with no evidence of gastric ulcer. Patient is symptomatically improved on Nexium 40 mg daily.  Patient Instructions: 1)  continue Nexium 40 mg daily. 2)  Continue Levsin sublingually 0.125 mg p.r.n. abdominal pain. 3)  Return office visit in 6 months. 4)  Copy sent to : Dr Everette Rank 5)  The medication list was reviewed and reconciled.  All changed / newly prescribed medications were explained.  A complete medication list was provided to the patient / caregiver.

## 2011-01-06 NOTE — Procedures (Signed)
Summary: EGD/[facility]  EGD/[facility]   Imported By: Phillis Knack 03/03/2010 10:41:13  _____________________________________________________________________  External Attachment:    Type:   Image     Comment:   External Document

## 2011-01-06 NOTE — Letter (Signed)
Summary: [facility]  [facility]   Imported By: Phillis Knack 03/03/2010 10:45:02  _____________________________________________________________________  External Attachment:    Type:   Image     Comment:   External Document

## 2011-01-06 NOTE — Consult Note (Signed)
Summary: Melena    NAME:  Katherine [name], Katherine [name] NO.:  [id number]   MEDICAL RECORD NO.:  [id number]                  PATIENT TYPE:   LOCATION:                                       FACILITY:   PHYSICIAN:  Hildred Laser, M.D.                 DATE OF BIRTH:   DATE OF CONSULTATION:  01/14/2004  DATE OF DISCHARGE:                                   CONSULTATION   REQUESTING PHYSICIAN:  Angus G. Everette Rank, M.D.   CHIEF COMPLAINT:  Melena.   HISTORY OF PRESENT ILLNESS:  Katherine [name] is a 77 year old  African-American female who presents to our office with a three-day episode  of large volume melena and diarrhea approximately 3 weeks ago.  She states  that she was very sick, nauseated, and went to the bathroom with the above  symptoms.  She denies any emesis.  She has had intermittent nausea off and  on since her bypass in 2003 for which she takes Phenergan on an as-needed  basis.  She also complains of occasional dyspepsia including heartburn.  She  is also today complaining of left upper quadrant pain as well.  Per notes  from Dr. Everette Rank' office she has returned hemoccult cards the end of January  and 1/3 were positive.  She also has normocytic anemia with hemoglobin of  9.3 and hematocrit of 27.1 on January 02, 2004.  She has been started on  [facility] since this episode.   CURRENT MEDICATIONS:  1. Actos 30 mg daily.  2. Lipitor 20 mg daily.  3. Novolin insulin 70/30 35 units in the morning.  4. Metamucil daily.  5. Ketac 400 mg daily.  6. Feosol b.i.d.  7. Aspirin 81 mg daily.  8. Tylenol daily.  9. Ativan 1 mg daily.  10.      Expect HC one q.12h.  11.      Ativan 1 mg q.6-8h.  12.      Promethazine 25 mg q.4h.  13.      Hydrochlorothiazide 37/25 mg b.i.d.  14.      Metoprolol 25 mg b.i.d.  15.      Enalapril 20 mg b.i.d.  16.      Potassium chloride 20 mEq daily.   ALLERGIES:  No known drug allergies.   PAST MEDICAL HISTORY:  1.  Reflux esophagitis as well as a stable 15-20 mm submucosal lesion found     at the gastric fundus and/or proximal gastric body with endoscopic     features of leiomyoma on last EGD from August 04, 1998.  She also     underwent flexible sigmoidoscopy on May 31, 1996 which revealed an     inflammatory rectal polyp.  2. Osteoarthritis.  3. Anxiety.  4. Diabetes mellitus, on insulin.  5. Anemia.  6. Hypertension.  7. Hyperlipidemia.  8. Edema.  9. Coronary artery disease.   PAST SURGICAL HISTORY:  1. CABG  in 2003.  2. Tubal ligation.  3. Knee surgery.  4. Uterine polyp.  5. Internal hemorrhoidectomy.   FAMILY HISTORY:  No known family history of colorectal carcinoma.  Both  mother (89) and father (70) are deceased with diabetes mellitus and CVA  respectively.  There is a strong family history of diabetes, hypertension,  and coronary artery disease.   SOCIAL HISTORY:  Katherine [name] is currently a widow.  She has five grown  children, two with diabetes as well.  She is retired from [employer].  She denies any tobacco, alcohol, or drug use.   REVIEW OF SYSTEMS:  CONSTITUTIONAL:  Weight is steadily increasing.  She is  complaining of some edema and fluid retention.  She does complain of  fatigue.  Denies any fever or chills.  CARDIOVASCULAR:  Occasional  palpitations.  Denies any chest pain.  PULMONARY:  Denies any shortness of  breath, dyspnea, or cough.  EXTREMITIES:  She is complaining of some  swelling to bilateral extremities.  ENDOCRINE:  Does have a history of  hyperthyroidism which was treated with iodine, she believes, when she was in  her 47s.  She has not had any problems with this since then.  She is  currently on insulin for about the last 27 years.   PHYSICAL EXAMINATION:  VITAL SIGNS:  Weight 253 pounds, up 26 pounds since  2000.  Height 62 inches.  Temperature 97.2, blood pressure 130/78, pulse 70.  GENERAL:  Katherine [name] is a 77 year old obese  African-American  female who is alert, oriented, pleasant, and cooperative.  HEENT:  Sclerae clear, nonicteric.  Conjunctivae pink.  Oropharynx pink and  moist without any lesions.  NECK:  Supple without any mass or thyromegaly.  CHEST:  Heart regular rate and rhythm with 2/6 murmur.  Lungs clear to  auscultation bilaterally.  ABDOMEN:  Positive bowel sounds x4, soft, nontender, nondistended.  She does  have multiple 1 to 1.5 cm round, firm nodules which may represent lipomas  versus lymph nodes.  No tenderness.  No discrete mass or hepatosplenomegaly.  RECTAL:  Deferred.  SKIN:  Brown, warm, and dry without any rash or jaundice.  EXTREMITIES:  She does have significant amount of upper and lower extremity  edema.  Bilateral lower extremities 2+.   LABORATORY STUDIES:  January 02, 2004:  Wbc is 9.6, hemoglobin 9.3,  hematocrit 27.1, MCV 85.9, platelets 372.  Liver function tests from  December 01, 2003:  Total bilirubin 0.7, direct 0.1, indirect 0.6, alkaline  phosphatase 76, SGOT 14, SGPT 13, total protein 7.9, albumin 4.6.   ASSESSMENT:  Katherine [name] is a 77 year old African-American female with a 3-  day episode of melena approximately 3 weeks ago along with anemia and  hemoccult positive stool.  It is questionable as to whether she has had a  recent gastroenteritis which may have possibly caused Mallory-Weiss tear.  However, she also has significant history of GERD and gastritis and may have  developed peptic ulcer disease as well which needs to be ruled out given  significant drop in hemoglobin.  Also would be concerned of diverticular  bleeding as well.  She does have a 20 mm submucosal lesion with is felt to  be a leiomyoma which was initially picked up on upper GI series in 1997 and  this may be source of bleeding as well.   RECOMMENDATIONS:  1. Will schedule EGD as well as colonoscopy with Dr. Laural Golden as soon as     possible.  I have discussed this procedure including risks  and benefits    which include but are not limited to bleeding, perforation, infection,     and drug reaction.  She agrees with this plan.  Consent will be obtained.     I have asked her to decrease her insulin to 22 units the day prior to and     of the procedure.  Also, take half of her normal dose of oral     hypoglycemics.  She is to hold her iron for a week and her aspirin for 3     days prior to the procedure.  SBE prophylaxis will be given, given the     fact that she has a murmur.  2. She is to follow up with her cardiologist, Dr. Tami Ribas, regarding the     edema.  Will also check TSH today.  3. H&H today.  4. Recommend PPI therapy.  Will give her samples of Prevacid 30 mg for 2     weeks, as well a Prevacid prescription 30 mg to be taken 30 minutes     before breakfast, #30 with two refills.  5. Pending colonoscopy and EGD, need to reevaluate left upper quadrant and     may need CT scan for further evaluation.  6. Further recommendations pending procedure.     ________________________________________  ___________________________________________  Les Pou, N.P.                  Hildred Laser, M.D.   KC/MEDQ  D:  01/14/2004  T:  01/14/2004  Job:  [id number]   cc:   Angus G. Everette Rank, M.D.  [address]  [place]  [place] [address]  Fax: [phone]

## 2011-01-06 NOTE — Letter (Signed)
Summary: [facility]  [facility]   Imported By: Phillis Knack 09/27/2010 15:29:34  _____________________________________________________________________  External Attachment:    Type:   Image     Comment:   External Document

## 2011-01-06 NOTE — Procedures (Signed)
Summary: Colonoscopy  Patient: [name] Note: All result statuses are Final unless otherwise noted.  Tests: (1) Colonoscopy (COL)   COL Colonoscopy           [facility] [employer].     [place], [place]  [id number]           COLONOSCOPY PROCEDURE REPORT           PATIENT:  [name], Katherine  MR#:  [id number]     BIRTHDATE:  06/14/1928, 82 yrs. old  GENDER:  female     ENDOSCOPIST:  Lowella Bandy. Olevia Perches, MD     REF. BY:  [name], M.D.     PROCEDURE DATE:  03/11/2010     PROCEDURE:  Colonoscopy [id number]     ASA CLASS:  Class II     INDICATIONS:  abdominal pain pt reports hx of colon polyps, last     colon 02/2004     MEDICATIONS:   none           DESCRIPTION OF PROCEDURE:   After the risks benefits and     alternatives of the procedure were thoroughly explained, informed     consent was obtained.  Digital rectal exam was performed and     revealed no rectal masses.   The LB [id number]-Q180AL [id number] endoscope     was introduced through the anus and advanced to the cecum, which     was identified by both the appendix and ileocecal valve, without     limitations.  The quality of the prep was good, using MoviPrep.     The instrument was then slowly withdrawn as the colon was fully     examined.     <<PROCEDUREIMAGES>>           FINDINGS:  Internal hemorrhoids were found (see image6 and     image7).  This was otherwise a normal examination of the colon     (see image5, image4, image3, image2, and image1). normal appering     sigmoid colon no spasm or thickening   Retroflexed views in the     rectum revealed no abnormalities.    The scope was then withdrawn     from the patient and the procedure completed.           COMPLICATIONS:  None     ENDOSCOPIC IMPRESSION:     1) Internal hemorrhoids     2) Otherwise normal examination     suspect IBS causing intermitteny LLQ abd. pain     RECOMMENDATIONS:     1) high fiber diet     LevsinSL.125mg , #30, 1 sl  tid prn, 2 refills     REPEAT EXAM:  In 0 year(s) for.  no recall due to age           ______________________________     Lowella Bandy. Olevia Perches, MD           CC:           n.     eSIGNED:   Lowella Bandy. Wyley Hack at 03/11/2010 02:24 PM           [name], [name], [id number]  Note: An exclamation mark (!) indicates a result that was not dispersed into the flowsheet. Document Creation Date: 03/11/2010 2:25 PM _______________________________________________________________________  (1) Order result status: Final Collection or observation date-time: 03/11/2010 14:05 Requested date-time:  Receipt date-time:  Reported date-time:  Referring Physician:  Ordering Physician: [name] [phone]) Specimen Source:  Source: Tawanna Cooler Order Number: [id number] Lab site:

## 2011-01-06 NOTE — Miscellaneous (Signed)
Summary: Levsin Rx/Recovery Room  Clinical Lists Changes  Medications: Added new medication of LEVSIN 0.125 MG  TABS (HYOSCYAMINE SULFATE) 1 sl three times a day prn - Signed Rx of LEVSIN 0.125 MG  TABS (HYOSCYAMINE SULFATE) 1 sl three times a day prn;  #30 x 2;  Signed;  Entered by: Thurston Pounds RN II;  Authorized by: Lafayette Dragon MD;  Method used: Electronically to [facility]. [phone]*, [address] [place], [place]  [address], Ph: [phone], Fax: [phone] Observations: Added new observation of ALLERGY REV: Done (03/11/2010 14:30) Added new observation of NKA: T (03/11/2010 14:30)    Prescriptions: LEVSIN 0.125 MG  TABS (HYOSCYAMINE SULFATE) 1 sl three times a day prn  #30 x 2   Entered by:   Thurston Pounds RN II   Authorized by:   Lafayette Dragon MD   Signed by:   Thurston Pounds RN II on 03/11/2010   Method used:   Electronically to        [facility]. [phone]* (retail)       [address], [place]  [address]       Ph: [phone]       Fax: [phone]   RxID:   [id number]

## 2011-03-13 LAB — POCT I-STAT, CHEM 8
BUN: 19 mg/dL (ref 6–23)
Creatinine, Ser: 1.1 mg/dL (ref 0.4–1.2)
Hemoglobin: 13.6 g/dL (ref 12.0–15.0)
Potassium: 3.8 mEq/L (ref 3.5–5.1)
TCO2: 23 mmol/L (ref 0–100)

## 2011-04-22 NOTE — Op Note (Signed)
[place]  Patient:    Katherine [name], Katherine [name] Visit Number: [id number] MRN: [id number]          Service Type: SUR Location: [id number] Attending Physician:  Montine Circle Dictated by:   Lilia Argue Servando Snare, M.D. Proc. Date: 05/29/02 Admit Date:  05/29/2002   CC:         Alla German, M.D.   Operative Report  PREOPERATIVE DIAGNOSIS: Coronary occlusive disease with progressive angina.  POSTOPERATIVE DIAGNOSIS: Same.  SURGICAL PROCEDURE: Coronary artery bypass grafting times four with left internal mammary artery to the left anterior descending coronary artery, reverse saphenous vein graft to the diagonal coronary artery, reverse saphenous vein graft to the circumflex coronary artery, reverse saphenous vein graft to the posterior descending artery coronary artery with endovein harvesting from the right thigh.  SURGEON:  Lilia Argue. Servando Snare, M.D.  FIRST ASSISTANT:  Suzzanne Cloud, PA.  BRIEF HISTORY:  The patient is a 77 year old female who is markedly overweight who presents with progressive anginal symptoms to the point where she is having episodes of angina with very minimal exertion.  Because of these symptoms, she had undergone cardiac catheterization at [facility] by Dr. Tami Ribas.  Review of these films revealed severe right coronary artery disease proximally of 90% and distally. In addition, she had an 80% stenosis of the left anterior descending, diagonal and circumflex.  Because of the patients significant three vessel coronary artery disease and progressive anginal symptoms, coronary artery bypass grafting was recommended in spite of the patients increased risk of surgery because of morbid obesity.  She agreed to surgery and signed informed consent.  DESCRIPTION OF PROCEDURE: With Swan-Ganz and arterial line monitors in place, the patient underwent general endotracheal anesthesia without incidence.   The skin of the chest and legs was prepped with Betadine and draped in the usual sterile manner.  Using a Guidant endovein harvesting system, vein was harvested from the right thigh and vein was harvested successfully and was of adequate quality and caliber.  The incision was carried down onto the lower leg to obtain sufficient length of vein for the number of bypasses needed.  A median sternotomy was performed and the left internal mammary artery was dissected down as a pedicle graft.  This vessel was of good quality and caliber.  The pericardium was opened and overall ventricular function appeared preserved.  The patient was systemically heparinized.  The ascending aorta and the right atrium were cannulated and the cardioplegia needle was introduced into the ascending aorta.  The patient was placed on cardiopulmonary bypass 2.4 liters per minute per meter square.  The sites of anastomosis were selected and dissected at the epicardium.  The patients body temperature was cooled to 30 degrees and the aortic cross clamp was applied.  Five hundred cc of cold blood potassium cardioplegia was administered with rapid diastolic arrest of the heart. Attention was turned first to the posterior descending artery which was opened and admitted a 1.5 mm probe.  Using a running 7-0 Prolene distal anastomosis was performed. Attention was then turned to the distal circumflex and this vessel was diffusely diseased. It was opened and admitted a 1.5 mm probe.  Using running 7-0 Prolene distal anastomosis was performed with a segment of reverse saphenous vein graft.  The diagonal coronary artery jeopardized this vessel and was opened and admitted a 1 mm probe.  Using a running 7-0 Prolene, the distal anastomosis was performed. Attention was then turned to the left  anterior descending coronary artery which was opened mid to distal portion of the vessel.  The left anterior descending was a small vessel and  admitted a 1 mm probe distally and a 1.5 mm probe proximally.  Using a running 8-0 Prolene, the left internal mammary artery was anastomosed the left anterior descending coronary artery.  With release of the bulldog on the mammary artery there was appropriate rise in myocardial septal temperature.  The aortic cross clamp was removed.  Total cross clamp time was 59 minutes.  The patient required electrical defibrillation to return to a sinus rhythm.  The partial occluding clamp was placed on the ascending aorta.  Three punch aortotomies were performed and each of the three vein grafts were anastomosed to the ascending aorta.  Air was evacuated from the grafts and the partial occluding clamp was removed. The sites of the anastomoses were inspected for any bleeding.  The patient was then ventilated and weaned from cardiopulmonary bypass without difficulty. She remained hemodynamically stable and was decannulated in the usual fashion. Protamine and sulfate were administered with the operative field hemostatic. Two atrial tubes and ventricular pacing wires were applied and left in place. A left pleural tube and two mediastinal tubes were left in place.  The sternum was closed with #6 stainless steel wires.  The fascia was closed with interrupted 0 Vicryl and running 3-0 Vicryl to the subcutaneous tissue and 4-0 subcuticular stitch in the skin edges.  Dry dressings were applied.  Sponge and needle counts were reported as correct at the completion of the procedure. The patient tolerated the procedure without obvious complications and was transferred to the surgical intensive care unit for further postoperative care. Dictated by:   Lilia Argue Servando Snare, M.D. Attending Physician:  Montine Circle DD:  05/30/02 TD:  06/02/02 Job: [id number]

## 2011-04-22 NOTE — Op Note (Signed)
Katherine [name], Katherine [name]                   ACCOUNT NO.:  [id number]   MEDICAL RECORD NO.:  [id number]                   PATIENT TYPE:  AMB   LOCATION:  DAY                                  FACILITY:  [facility]   PHYSICIAN:  Hildred Laser, M.D.                 DATE OF BIRTH:  10-04-1928   DATE OF PROCEDURE:  05/05/2004  DATE OF DISCHARGE:                                 OPERATIVE REPORT   PROCEDURE PERFORMED:  Esophagogastroduodenoscopy.   INDICATIONS FOR PROCEDURE:  [name] is a 77 year old African-American  female who presented with gastrointestinal bleeding three months ago and was  found to have a gastric ulcer.  She has been treated for Helicobacter pylori  infection.  She is back on a low dose ASA and is on Prevacid.  She is  undergoing EGD to document complete healing of this ulcer.  She is presently  asymptomatic.  The procedure was reviewed with the patient and informed  consent was obtained.   PREOP MEDICATIONS:  Cetacaine spray for pharyngeal topical anesthesia.  Demerol 25 mg IV, Versed 4 mg in divided dose.   DESCRIPTION OF PROCEDURE:  Procedure performed in endoscopy suite.  Patient's vital signs and oxygen saturations were monitored during the  procedure and remained stable.  The patient was placed in left lateral  position and Olympus video scope was passed via oropharynx without any  difficulty into the esophagus.   Esophagus:  Mucosa of the esophagus normal.  Squamocolumnar junction was  unremarkable.  There was a 3 cm size sliding hiatal hernia.   Stomach:  It was empty and distended very well with insufflation.  There was  a large, i.e. 3 cm submucosal mass in the proximal gastric body which was  also seen on the previous study.  It had smooth mucosa over it.  Endoscopically this was felt to be a leiomyoma but may be a lipoma.  Examination of the antrum revealed some patchy erythema with two scars.  Ulcer was felt to be completely healed.  Pyloric exam was  patent.  Angularis, fundus and cardia were examined by retroflexion of scope and were  normal.   Duodenum:  Examination of the bulb and postbulbar mucosa was normal.  Endoscope was withdrawn.  The patient tolerated the procedure well.   FINAL DIAGNOSIS:  Gastric ulcer has completely healed.  Patchy antral  erythema.   A large submucosal lesion at the proximal gastric body which is unchanged  since the previous study of three months ago but was initially seen in 1999.  This is possibly a leiomyoma but could be a lipoma.   A small sliding hiatal hernia.   RECOMMENDATIONS:  She should continue proton pump inhibitors as long as she  is on ASA.  The patient is advised not to take any over-the-counter NSAIDs.      ___________________________________________  Hildred Laser, M.D.   NR/MEDQ  D:  05/05/2004  T:  05/06/2004  Job:  [id number]   cc:   Angus G. Everette Rank, M.D.  [address]  [place]  [place] [address]  Fax: [phone]

## 2011-04-22 NOTE — Discharge Summary (Signed)
[place]  Patient:    Katherine [name], Katherine [name] Visit Number: [id number] MRN: [id number]          Service Type: SUR Location: [id number] Attending Physician:  Montine Circle Dictated by:   Rosealee Albee Collins, P.A.-C. Admit Date:  05/29/2002 Discharge Date: 06/05/2002   CC:         Alla German, M.D.  [name], M.D.   Discharge Summary  PRIMARY ADMITTING DIAGNOSIS:  Severe three-vessel coronary artery disease.  ADDITIONAL DIAGNOSES: 1. Type 2 insulin-dependent diabetes mellitus. 2. Hypertension. 3. Hyperlipidemia. 4. Obesity. 5. Postoperative anemia.  PROCEDURES PERFORMED: 1. Coronary artery bypass grafting x4 (left internal mammary artery to the    left anterior descending, saphenous vein graft to the diagonal, saphenous    vein graft to the circumflex coronary artery, saphenous vein graft to the    posterior descending artery). 2. Endoscopic vein harvest, right thigh, to mid-calf.  HISTORY:  The patient is a 77 year old black female who is regularly followed by Dr. Alla German for a history of tachycardia as well as for her hypertension.  She relates a history of intermittent chest pain recently which has increased in frequency and occurs mostly with exertion.  She had a Cardiolite stress test which was positive for ischemia.  She underwent cardiac catheterization on May 08, 2002 and was found to have significant three-vessel coronary artery disease.  It was recommended that she pursue surgical revascularization and she was seen in consultation as an outpatient by Dr. Percell Miller B. Gerhardt.  It was agreed that she should proceed with surgery at this time.  HOSPITAL COURSE:  She was admitted on May 29, 2002 and taken to the operating room where she underwent CABG x4 with the above-noted grafts.  She tolerated the procedure well and was transferred to the [facility] in stable condition.  She was extubated shortly after  surgery.  She was hemodynamically stable on postop day 1 and at the time was deemed ready for transfer to the step-down unit. She was noted to be somewhat tachycardic in the immediate postoperative period, therefore, she was started on beta blocker therapy and her dose was gradually titrated up until better rate control was achieved.  Her chest tubes were removed and she was mobilized.  By postop day 3, she was ready for transfer to the floor.  She was noted to be anemic with a hemoglobin of 7.3 and hematocrit of 21.1 and was symptomatic from this problem, therefore she was transfused one unit of packed red blood cells.  Following her transfusion, her hemoglobin and hematocrit rose to 8.8 and 25.9, respectively.  At the present, she has remained afebrile and all vital signs have been stable.  She is maintaining normal sinus rhythm.  Her wounds are healing well.  Her blood sugars have been fairly well-controlled on her home medication regimen, running from between 100 to 200.  She has been ambulating with cardiac rehab phase 1 and using a rolling walker.  She is tolerating a regular diet and having normal bowel and bladder function.  It is felt that if she continues to remain stable, she will be ready for discharge home on June 05, 2002.  DISCHARGE MEDICATIONS: 1. Lopressor 25 mg b.i.d. 2. Actos 30 mg q.d. 3. Novolin 70/30 -- 35 units q.d. 4. Lipitor 20 mg q.d. 5. Tylox one to two q.4h. p.r.n. for pain. 6. She has an intolerance to aspirin which causes significant tachycardia,    therefore, she  has not been started on aspirin, however, a decision will be    made prior to discharge about whether to send her home on Plavix 75 mg    q.d.; this will be indicated on the pink discharge instruction sheet.  ACTIVITY:  She is to refrain from driving, heavy lifting or strenuous activity.  DIET:  She may continue a low-fat, low-sodium diet.  WOUND CARE:  She is asked to shower daily and clean  incisions with soap and water.  Home health nursing will be arranged to remove her staples in one week.  DISCHARGE FOLLOWUP:  She is asked to make an appointment to see Dr. Tami Ribas in two weeks.  She has an appointment scheduled to see Dr. Servando Snare on June 20, 2002 at 12 p.m.  She will have a chest x-ray at [facility] one hour prior to this appointment and bring her films to the [facility] office with her. Dictated by:   Rosealee Albee Collins, P.A.-C. Attending Physician:  Montine Circle DD:  06/04/02 TD:  06/06/02 Job: [id number]

## 2011-04-22 NOTE — Consult Note (Signed)
NAME:  Katherine, [name] NO.:  [id number]   MEDICAL RECORD NO.:  [id number]                  PATIENT TYPE:   LOCATION:                                       FACILITY:   PHYSICIAN:  Katherine [name], M.D.                 DATE OF BIRTH:   DATE OF CONSULTATION:  01/14/2004  DATE OF DISCHARGE:                                   CONSULTATION   REQUESTING PHYSICIAN:  Katherine [name], M.D.   CHIEF COMPLAINT:  Melena.   HISTORY OF PRESENT ILLNESS:  [name] is a 77 year old  African-American female who presents to our office with a three-day episode  of large volume melena and diarrhea approximately 3 weeks ago.  She states  that she was very sick, nauseated, and went to the bathroom with the above  symptoms.  She denies any emesis.  She has had intermittent nausea off and  on since her bypass in 2003 for which she takes Phenergan on an as-needed  basis.  She also complains of occasional dyspepsia including heartburn.  She  is also today complaining of left upper quadrant pain as well.  Per notes  from Dr. Everette [name]' office she has returned hemoccult cards the end of January  and 1/3 were positive.  She also has normocytic anemia with hemoglobin of  9.3 and hematocrit of 27.1 on January 02, 2004.  She has been started on  [facility] since this episode.   CURRENT MEDICATIONS:  1. Actos 30 mg daily.  2. Lipitor 20 mg daily.  3. Novolin insulin 70/30 35 units in the morning.  4. Metamucil daily.  5. Ketac 400 mg daily.  6. Feosol b.i.d.  7. Aspirin 81 mg daily.  8. Tylenol daily.  9. Ativan 1 mg daily.  10.      Expect HC one q.12h.  11.      Ativan 1 mg q.6-8h.  12.      Promethazine 25 mg q.4h.  13.      Hydrochlorothiazide 37/25 mg b.i.d.  14.      Metoprolol 25 mg b.i.d.  15.      Enalapril 20 mg b.i.d.  16.      Potassium chloride 20 mEq daily.   ALLERGIES:  No known drug allergies.   PAST MEDICAL HISTORY:  1. Reflux esophagitis as  well as a stable 15-20 mm submucosal lesion found     at the gastric fundus and/or proximal gastric body with endoscopic     features of leiomyoma on last EGD from August 04, 1998.  She also     underwent flexible sigmoidoscopy on May 31, 1996 which revealed an     inflammatory rectal polyp.  2. Osteoarthritis.  3. Anxiety.  4. Diabetes mellitus, on insulin.  5. Anemia.  6. Hypertension.  7. Hyperlipidemia.  8. Edema.  9. Coronary artery disease.   PAST SURGICAL HISTORY:  1. CABG in 2003.  2. Tubal  ligation.  3. Knee surgery.  4. Uterine polyp.  5. Internal hemorrhoidectomy.   FAMILY HISTORY:  No known family history of colorectal carcinoma.  Both  mother (36) and father (24) are deceased with diabetes mellitus and CVA  respectively.  There is a strong family history of diabetes, hypertension,  and coronary artery disease.   SOCIAL HISTORY:  Katherine [name] is currently a widow.  She has five grown  children, two with diabetes as well.  She is retired from [employer].  She denies any tobacco, alcohol, or drug use.   REVIEW OF SYSTEMS:  CONSTITUTIONAL:  Weight is steadily increasing.  She is  complaining of some edema and fluid retention.  She does complain of  fatigue.  Denies any fever or chills.  CARDIOVASCULAR:  Occasional  palpitations.  Denies any chest pain.  PULMONARY:  Denies any shortness of  breath, dyspnea, or cough.  EXTREMITIES:  She is complaining of some  swelling to bilateral extremities.  ENDOCRINE:  Does have a history of  hyperthyroidism which was treated with iodine, she believes, when she was in  her 84s.  She has not had any problems with this since then.  She is  currently on insulin for about the last 27 years.   PHYSICAL EXAMINATION:  VITAL SIGNS:  Weight 253 pounds, up 26 pounds since  2000.  Height 62 inches.  Temperature 97.2, blood pressure 130/78, pulse 70.  GENERAL:  [name] is a 77 year old obese African-American  female  who is alert, oriented, pleasant, and cooperative.  HEENT:  Sclerae clear, nonicteric.  Conjunctivae pink.  Oropharynx pink and  moist without any lesions.  NECK:  Supple without any mass or thyromegaly.  CHEST:  Heart regular rate and rhythm with 2/6 murmur.  Lungs clear to  auscultation bilaterally.  ABDOMEN:  Positive bowel sounds x4, soft, nontender, nondistended.  She does  have multiple 1 to 1.5 cm round, firm nodules which may represent lipomas  versus lymph nodes.  No tenderness.  No discrete mass or hepatosplenomegaly.  RECTAL:  Deferred.  SKIN:  Brown, warm, and dry without any rash or jaundice.  EXTREMITIES:  She does have significant amount of upper and lower extremity  edema.  Bilateral lower extremities 2+.   LABORATORY STUDIES:  January 02, 2004:  Wbc is 9.6, hemoglobin 9.3,  hematocrit 27.1, MCV 85.9, platelets 372.  Liver function tests from  December 01, 2003:  Total bilirubin 0.7, direct 0.1, indirect 0.6, alkaline  phosphatase 76, SGOT 14, SGPT 13, total protein 7.9, albumin 4.6.   ASSESSMENT:  [name] is a 77 year old African-American female with a 3-  day episode of melena approximately 3 weeks ago along with anemia and  hemoccult positive stool.  It is questionable as to whether she has had a  recent gastroenteritis which may have possibly caused Mallory-Weiss tear.  However, she also has significant history of GERD and gastritis and may have  developed peptic ulcer disease as well which needs to be ruled out given  significant drop in hemoglobin.  Also would be concerned of diverticular  bleeding as well.  She does have a 20 mm submucosal lesion with is felt to  be a leiomyoma which was initially picked up on upper GI series in 1997 and  this may be source of bleeding as well.   RECOMMENDATIONS:  1. Will schedule EGD as well as colonoscopy with Dr. Laural [name] as soon as     possible.  I have discussed this  procedure including risks and benefits    which  include but are not limited to bleeding, perforation, infection,     and drug reaction.  She agrees with this plan.  Consent will be obtained.     I have asked her to decrease her insulin to 22 units the day prior to and     of the procedure.  Also, take half of her normal dose of oral     hypoglycemics.  She is to hold her iron for a week and her aspirin for 3     days prior to the procedure.  SBE prophylaxis will be given, given the     fact that she has a murmur.  2. She is to follow up with her cardiologist, Dr. Tami Ribas, regarding the     edema.  Will also check TSH today.  3. H&H today.  4. Recommend PPI therapy.  Will give her samples of Prevacid 30 mg for 2     weeks, as well a Prevacid prescription 30 mg to be taken 30 minutes     before breakfast, #30 with two refills.  5. Pending colonoscopy and EGD, need to reevaluate left upper quadrant and     may need CT scan for further evaluation.  6. Further recommendations pending procedure.     ________________________________________  ___________________________________________  Les Pou, N.P.                  Katherine [name], M.D.   KC/MEDQ  D:  01/14/2004  T:  01/14/2004  Job:  [id number]   cc:   Katherine [name], M.D.  [address]  [place]  [place] [address]  Fax: [phone]

## 2011-04-22 NOTE — Op Note (Signed)
NAME:  Katherine [name], Katherine [name]                   ACCOUNT NO.:  [id number]   MEDICAL RECORD NO.:  [id number]                   PATIENT TYPE:  AMB   LOCATION:  DAY                                  FACILITY:  [facility]   PHYSICIAN:  Hildred Laser, M.D.                 DATE OF BIRTH:  [birth year]   DATE OF PROCEDURE:  02/04/2004  DATE OF DISCHARGE:                                 OPERATIVE REPORT   PROCEDURE:  Esophagogastroduodenoscopy followed by total colonoscopy.   INDICATIONS:  Mrs. Werking is a 77 year old African American female who  recently developed melena.  Her hemoglobin was 9.3.  She had been __________  which was discontinued.  It is suspected she may have peptic ulcer disease  and, therefore, undergoing diagnostic EGD.  She is also undergoing  colonoscopy primarily for screening purposes.  The procedure and risks were  reviewed with the patient and informed consent was obtained.   PREMEDICATION:  Demerol 50 mg IV,  Versed 6 mg IV.   FINDINGS:  The procedures were performed in the endoscopy suite.  The  patient's vital signs and O2 saturation were monitored during the procedure  and remained stable.   ESOPHAGOGASTRODUODENOSCOPY:  The patient was placed in the left lateral  recumbent position and Olympus videoscope was passed d through the  oropharynx without any difficulty into the esophagus.   Esophagus:  Mucosa of the esophagus was normal throughout.  The GE junction  was unremarkable.  There was a 3 cm size sliding hiatal hernia.   Stomach:  It was empty and distended very well on insufflation.  Folds of  the proximal stomach were normal.  Examination of the mucosa revealed patchy  erythema.  In the antrum there was a deep prepyloric/antral ulcer along the  anterior wall measuring 6-7 mm.  It had a clean base.  Mucosa around it was  very swollen.  Biopsy was taken from its margin although approach was very  difficult.  Pyloric channel was patent.  Angularis, fundus and  cardia were  examined by retroflexing the scope.  There was a round 3 cm size submucosal  lesion at the fundus.  Mucosa, however, was normal.  Endoscopically this  suspicious for a leiomyoma.  I could not retrieve the pictures from August  1999.  My visual impression was this was slightly increased in size.   Duodenum:  Examination of the bulb and second part of the duodenum was  normal.   The endoscope was withdrawn and the patient was prepared for procedure #2.   TOTAL COLONOSCOPY:  Rectal examination performed.  No abnormality noted on  the external or digital exam.  Olympus videoscope was placed in the rectum  and advanced under vision into the sigmoid colon and beyond.  Preparation  was satisfactory.  Scope was passed into the cecum which was identified by  the appendiceal orifice and ileocecal valve.  Pictures were taken for the  record.  As  the scope was withdrawn, colonic mucosa was once again carefully  examined and was normal throughout.  Rectal mucosa similarly was normal.  Scope was retroflexed, examined.  The anorectal junction was unremarkable.  Endoscope was straightened and withdrawn.  The patient tolerated the  procedure well.   FINAL DIAGNOSIS:  1. Gastric ulcer at antrum.  It was felt to be the source of recent     gastrointestinal bleed.  Biopsy taken from ulcer margin.  2. A 3 cm size submucosal lesion at gastric fundus consistent with a     leiomyoma.  Size may have slightly increased since previous EGD of August     1999.  Pictures/records have been requested.  3. Small sliding hiatal hernia.  4. Normal colonoscopy.   RECOMMENDATIONS:  1. She will resume her ferrous sulfate at 325 mg p.o. b.i.d.  2. Helicobacter pylori serology and hemoglobin and hematocrit will be     checked today.  She will continue Prevacid.  She can stay on her low-dose     aspirin but should not take any NSAIDs, at least for now.  3. She will return for followup EGD in about 10 weeks,  presuming biopsy is     benign.      ___________________________________________                                            Hildred Laser, M.D.   NR/MEDQ  D:  02/04/2004  T:  02/04/2004  Job:  [id number]   cc:   Angus G. Everette Rank, M.D.  [address]  [place]  [place] [address]  Fax: [phone]

## 2011-08-17 ENCOUNTER — Telehealth: Payer: Self-pay | Admitting: Internal Medicine

## 2011-08-17 ENCOUNTER — Encounter: Payer: Self-pay | Admitting: *Deleted

## 2011-08-17 NOTE — Telephone Encounter (Signed)
Scheduled patient with Nicoletta Ba, PA on 08/18/11 at 10:00 AM. Vinnie Level to fax records on patient.

## 2011-08-18 ENCOUNTER — Encounter: Payer: Self-pay | Admitting: Physician Assistant

## 2011-08-18 ENCOUNTER — Encounter: Payer: Self-pay | Admitting: *Deleted

## 2011-08-18 ENCOUNTER — Ambulatory Visit (INDEPENDENT_AMBULATORY_CARE_PROVIDER_SITE_OTHER): Payer: Medicare Other | Admitting: Physician Assistant

## 2011-08-18 VITALS — BP 144/60 | HR 72 | Ht 62.0 in | Wt 223.0 lb

## 2011-08-18 DIAGNOSIS — E119 Type 2 diabetes mellitus without complications: Secondary | ICD-10-CM | POA: Insufficient documentation

## 2011-08-18 DIAGNOSIS — R195 Other fecal abnormalities: Secondary | ICD-10-CM

## 2011-08-18 DIAGNOSIS — K3189 Other diseases of stomach and duodenum: Secondary | ICD-10-CM | POA: Insufficient documentation

## 2011-08-18 DIAGNOSIS — K319 Disease of stomach and duodenum, unspecified: Secondary | ICD-10-CM

## 2011-08-18 MED ORDER — ESOMEPRAZOLE MAGNESIUM 40 MG PO CPDR: DELAYED_RELEASE_CAPSULE | ORAL | Status: DC

## 2011-08-18 NOTE — Progress Notes (Signed)
Agree with initial assessment and plans for PPI and EGD

## 2011-08-18 NOTE — Patient Instructions (Signed)
We have scheduled the Endoscopy with Dr. [name]. Directions and brochure provided. We have given you samples of Nexium, take 1 capsule 30 min before breakfast. You will not take the Metformin or the Levemir pen the morning before the procedure.

## 2011-08-18 NOTE — Progress Notes (Signed)
Subjective:    Patient ID: Katherine [name], female    DOB: 1928/07/05, 77 y.o.   MRN: [id number]  HPI Katherine [name] is a very nice 77 year old African American female known to Dr. [name]  who was last seen about a year ago. She does have history of colon polyps there were no polyps found on most recent colonoscopy in April of 2011. She did have a submucosal mass lesion found on endoscopy in May of 2011 as well as moderate gastritis. This lesion was felt to be about 3 cm in diameter and was biopsied. Biopsies came back showing just chronic gastritis. Clinically it was felt to be consistent with a leiomyoma.  At this time she is a referred back through Dr. [name] and complains of dark stools over the past week. Patient has not been on any iron supplements and denies any Pepto-Bismol use. She says she's been having 1-2 dark stools per day. She also has been having some intermittent burning type of abdominal pain which he describes as just undergoing". She is also having some intermittent epigastric pain. She has occasional nausea without vomiting and her appetite is fair. She has had a gradual weight loss from 260-223 over the past year or so and attributes this to decrease in appetite. She has not been on any NSAIDs, had been taking one baby aspirin per day which was stopped yesterday.  Her stool is documented Hemoccult-positive yesterday per Dr. [name] and CBC showed a hemoglobin above 15, hematocrit of 40.    Review of Systems  Constitutional: Positive for unexpected weight change.  HENT: Negative.   Eyes: Negative.   Respiratory: Negative.   Cardiovascular: Negative.   Gastrointestinal: Positive for nausea, abdominal pain and blood in stool.  Genitourinary: Negative.   Musculoskeletal: Negative.   Skin: Negative.   Neurological: Negative.   Hematological: Negative.   Psychiatric/Behavioral: Negative.    Outpatient Prescriptions Prior to Visit  Medication Sig Dispense  Refill  . albuterol-ipratropium (COMBIVENT) 18-103 MCG/ACT inhaler Inhale 2 puffs into the lungs every 6 (six) hours as needed.        Marland Kitchen amLODipine (NORVASC) 5 MG tablet Take 10 mg by mouth daily.       Marland Kitchen aspirin 81 MG tablet Take 81 mg by mouth daily.            No Known Allergies Patient Active Problem List  Diagnoses  . THYROID MASS  . DIABETES MELLITUS, TYPE II  . HYPERLIPIDEMIA  . UNSPECIFIED ANEMIA  . ANXIETY  . HYPERTENSION  . CORONARY ARTERY DISEASE  . CHF  . REFLUX ESOPHAGITIS  . HIATAL HERNIA  . OSTEOARTHRITIS  . GASTRIC ULCER, HX OF  . COLONIC POLYPS, HX OF  . Diabetes mellitus, type 2  . Gastric mass     Objective:   Physical Exam Well-developed elderly African American female in no acute distress, pleasant, ambulating with a cane accompanied by her daughter ,HEENT; nonicteric normocephalic EOMI PERRLA, sclera anicteric, Neck; supple no JVD, Cardiovascular ;regular rate and rhythm with S1 and S2 soft systolic murmur, Pulmonary; clear bilaterally, Abdomen ;soft nondistended bowel sounds active, no palpable mass or hepatosplenomegaly, she is very mildly tender in the epigastrium no guarding no rebound, Rectal; not repeated,, Hemoccult positive yesterday. Extremities; trace pedal edema skin warm and dry benign Psych; mood and affect normal an appropriate.        Assessment & Plan:  #72 77 year old female with previously documented small submucosal gastric mass, felt clinically consistent with a leiomyoma  however biopsies were negative in May 2011. She now presents with dark stools documented Hemoccult-positive x1 week. Rule out low-grade upper GI bleeding secondary to enlargement and/or ulceration of the submucosal lesion.  Plan; Schedule for upper endoscopy with Dr. [name] as soon as possible Patient advised to stay off of aspirin Start Nexium 40 mg by mouth every morning empirically until endoscopy is done, samples given Outpatient and daughter advised to call  should she have any sense of increase in dark stools and/or bleeding in the interim.  #2 Remote history of colon polyps on negative colonoscopy 2011, no followup plan do to age.  #3 Adult-onset diabetes mellitus, insulin-dependent #4 Coronary artery disease, and history of CHF.

## 2011-08-23 ENCOUNTER — Encounter: Payer: Self-pay | Admitting: Internal Medicine

## 2011-08-23 ENCOUNTER — Ambulatory Visit (AMBULATORY_SURGERY_CENTER): Payer: Medicare Other | Admitting: Internal Medicine

## 2011-08-23 VITALS — BP 186/82 | HR 74 | Temp 98.0°F | Resp 18 | Ht 62.0 in | Wt 223.0 lb

## 2011-08-23 DIAGNOSIS — D375 Neoplasm of uncertain behavior of rectum: Secondary | ICD-10-CM

## 2011-08-23 DIAGNOSIS — D371 Neoplasm of uncertain behavior of stomach: Secondary | ICD-10-CM

## 2011-08-23 DIAGNOSIS — R195 Other fecal abnormalities: Secondary | ICD-10-CM

## 2011-08-23 LAB — GLUCOSE, CAPILLARY: Glucose-Capillary: 164 mg/dL — ABNORMAL HIGH (ref 70–99)

## 2011-08-23 MED ORDER — SODIUM CHLORIDE 0.9 % IV SOLN: 500.0000 mL | INTRAVENOUS | Status: DC

## 2011-08-24 ENCOUNTER — Telehealth: Payer: Self-pay

## 2011-08-24 ENCOUNTER — Encounter: Payer: Self-pay | Admitting: Internal Medicine

## 2011-08-24 NOTE — Progress Notes (Signed)
From 8:10 until 11:00 [employer] computer system wide down, after 11:00 wireless network still not working. Downtime procedure policy followed.  Information entered by Joylene John RN, Procedure room nurse was Everlean Patterson RN and Recovery room nurse was Salome Arnt RN

## 2011-08-24 NOTE — Telephone Encounter (Signed)
Number does not take unidentified calls.

## 2011-09-01 ENCOUNTER — Other Ambulatory Visit: Payer: Medicare Other | Admitting: Internal Medicine

## 2011-09-21 LAB — I-STAT 8, (EC8 V) (CONVERTED LAB)
Acid-Base Excess: 3 — ABNORMAL HIGH
Bicarbonate: 27.9 — ABNORMAL HIGH
Glucose, Bld: 234 — ABNORMAL HIGH
HCT: 44
Hemoglobin: 15
Operator id: 279831
Potassium: 3.9
Sodium: 133 — ABNORMAL LOW
TCO2: 29

## 2011-09-21 LAB — BASIC METABOLIC PANEL WITH GFR
BUN: 28 — ABNORMAL HIGH
CO2: 27
GFR calc non Af Amer: 36 — ABNORMAL LOW
Glucose, Bld: 227 — ABNORMAL HIGH
Potassium: 3.7
Sodium: 135

## 2011-09-21 LAB — BASIC METABOLIC PANEL
Calcium: 10.2
Chloride: 95 — ABNORMAL LOW
Creatinine, Ser: 1.4 — ABNORMAL HIGH
GFR calc Af Amer: 44 — ABNORMAL LOW

## 2011-09-21 LAB — URINALYSIS, ROUTINE W REFLEX MICROSCOPIC
Bilirubin Urine: NEGATIVE
Glucose, UA: NEGATIVE
Hgb urine dipstick: NEGATIVE
Ketones, ur: NEGATIVE
Nitrite: NEGATIVE
Protein, ur: NEGATIVE
Specific Gravity, Urine: 1.008
Urobilinogen, UA: 0.2
pH: 5.5

## 2011-09-21 LAB — URINE MICROSCOPIC-ADD ON

## 2011-09-21 LAB — POCT I-STAT CREATININE: Operator id: 279831

## 2011-09-22 LAB — URINALYSIS, ROUTINE W REFLEX MICROSCOPIC
Glucose, UA: NEGATIVE
Nitrite: NEGATIVE
Protein, ur: NEGATIVE
Urobilinogen, UA: 1

## 2011-09-22 LAB — DIFFERENTIAL
Basophils Absolute: 0
Eosinophils Absolute: 0
Eosinophils Relative: 0

## 2011-09-22 LAB — CBC
HCT: 35.3 — ABNORMAL LOW
MCHC: 33.7
MCV: 85.2
Platelets: 257
RDW: 15 — ABNORMAL HIGH

## 2011-09-22 LAB — I-STAT 8, (EC8 V) (CONVERTED LAB)
BUN: 24 — ABNORMAL HIGH
Glucose, Bld: 178 — ABNORMAL HIGH
HCT: 36
Hemoglobin: 12.2
Potassium: 3.7
Sodium: 140
TCO2: 27

## 2011-09-22 LAB — URINE MICROSCOPIC-ADD ON

## 2011-09-22 LAB — POCT CARDIAC MARKERS: Operator id: 270651

## 2011-11-04 ENCOUNTER — Other Ambulatory Visit (HOSPITAL_COMMUNITY): Payer: Self-pay | Admitting: Family Medicine

## 2011-11-04 ENCOUNTER — Ambulatory Visit (HOSPITAL_COMMUNITY)
Admission: RE | Admit: 2011-11-04 | Discharge: 2011-11-04 | Disposition: A | Discharge status: Home / Self Care | Payer: Medicare Other | Source: Ambulatory Visit | Attending: Family Medicine | Admitting: Family Medicine

## 2011-11-04 DIAGNOSIS — R52 Pain, unspecified: Secondary | ICD-10-CM

## 2011-11-04 DIAGNOSIS — M545 Low back pain, unspecified: Secondary | ICD-10-CM | POA: Insufficient documentation

## 2011-11-04 DIAGNOSIS — M47817 Spondylosis without myelopathy or radiculopathy, lumbosacral region: Secondary | ICD-10-CM | POA: Insufficient documentation

## 2011-12-19 DIAGNOSIS — M533 Sacrococcygeal disorders, not elsewhere classified: Secondary | ICD-10-CM | POA: Diagnosis not present

## 2011-12-27 ENCOUNTER — Ambulatory Visit: Payer: Medicare Other | Admitting: Physical Therapy

## 2011-12-27 ENCOUNTER — Ambulatory Visit: Payer: Medicare Other | Attending: Physical Medicine and Rehabilitation | Admitting: Physical Therapy

## 2011-12-27 DIAGNOSIS — R293 Abnormal posture: Secondary | ICD-10-CM | POA: Insufficient documentation

## 2011-12-27 DIAGNOSIS — IMO0001 Reserved for inherently not codable concepts without codable children: Secondary | ICD-10-CM | POA: Diagnosis not present

## 2011-12-27 DIAGNOSIS — M25559 Pain in unspecified hip: Secondary | ICD-10-CM | POA: Insufficient documentation

## 2011-12-27 DIAGNOSIS — M25659 Stiffness of unspecified hip, not elsewhere classified: Secondary | ICD-10-CM | POA: Insufficient documentation

## 2011-12-29 ENCOUNTER — Ambulatory Visit: Payer: Medicare Other | Admitting: Physical Therapy

## 2012-01-04 ENCOUNTER — Ambulatory Visit: Payer: Medicare Other | Admitting: Physical Therapy

## 2012-01-05 ENCOUNTER — Ambulatory Visit: Payer: Medicare Other | Admitting: Physical Therapy

## 2012-01-09 ENCOUNTER — Ambulatory Visit: Payer: Medicare Other | Attending: Physical Medicine and Rehabilitation | Admitting: Physical Therapy

## 2012-01-09 DIAGNOSIS — M25659 Stiffness of unspecified hip, not elsewhere classified: Secondary | ICD-10-CM | POA: Insufficient documentation

## 2012-01-09 DIAGNOSIS — M25559 Pain in unspecified hip: Secondary | ICD-10-CM | POA: Insufficient documentation

## 2012-01-09 DIAGNOSIS — IMO0001 Reserved for inherently not codable concepts without codable children: Secondary | ICD-10-CM | POA: Diagnosis not present

## 2012-01-09 DIAGNOSIS — R293 Abnormal posture: Secondary | ICD-10-CM | POA: Insufficient documentation

## 2012-01-11 ENCOUNTER — Ambulatory Visit: Payer: Medicare Other | Admitting: Physical Therapy

## 2012-01-16 ENCOUNTER — Encounter: Payer: Medicare Other | Admitting: Physical Therapy

## 2012-01-17 ENCOUNTER — Ambulatory Visit: Payer: Medicare Other | Admitting: Rehabilitation

## 2012-01-18 ENCOUNTER — Ambulatory Visit: Payer: Medicare Other | Admitting: Physical Therapy

## 2012-01-25 ENCOUNTER — Ambulatory Visit: Payer: Medicare Other | Admitting: Physical Therapy

## 2012-01-30 DIAGNOSIS — M533 Sacrococcygeal disorders, not elsewhere classified: Secondary | ICD-10-CM | POA: Diagnosis not present

## 2012-02-01 ENCOUNTER — Ambulatory Visit: Payer: Medicare Other | Admitting: Physical Therapy

## 2012-02-02 ENCOUNTER — Ambulatory Visit: Payer: Medicare Other | Admitting: Physical Therapy

## 2012-03-13 DIAGNOSIS — Z961 Presence of intraocular lens: Secondary | ICD-10-CM | POA: Diagnosis not present

## 2012-03-13 DIAGNOSIS — H25099 Other age-related incipient cataract, unspecified eye: Secondary | ICD-10-CM | POA: Diagnosis not present

## 2012-03-15 DIAGNOSIS — M76899 Other specified enthesopathies of unspecified lower limb, excluding foot: Secondary | ICD-10-CM | POA: Diagnosis not present

## 2012-04-17 ENCOUNTER — Emergency Department (HOSPITAL_COMMUNITY): Payer: Medicare Other

## 2012-04-17 ENCOUNTER — Encounter (HOSPITAL_COMMUNITY): Payer: Self-pay | Admitting: *Deleted

## 2012-04-17 ENCOUNTER — Emergency Department (HOSPITAL_COMMUNITY)
Admission: EM | Admit: 2012-04-17 | Discharge: 2012-04-17 | Disposition: A | Discharge status: Home / Self Care | Payer: Medicare Other | Attending: Emergency Medicine | Admitting: Emergency Medicine

## 2012-04-17 DIAGNOSIS — I10 Essential (primary) hypertension: Secondary | ICD-10-CM | POA: Insufficient documentation

## 2012-04-17 DIAGNOSIS — K5900 Constipation, unspecified: Secondary | ICD-10-CM | POA: Insufficient documentation

## 2012-04-17 DIAGNOSIS — M549 Dorsalgia, unspecified: Secondary | ICD-10-CM | POA: Insufficient documentation

## 2012-04-17 DIAGNOSIS — I2510 Atherosclerotic heart disease of native coronary artery without angina pectoris: Secondary | ICD-10-CM | POA: Insufficient documentation

## 2012-04-17 DIAGNOSIS — Z794 Long term (current) use of insulin: Secondary | ICD-10-CM | POA: Diagnosis not present

## 2012-04-17 DIAGNOSIS — E119 Type 2 diabetes mellitus without complications: Secondary | ICD-10-CM | POA: Diagnosis not present

## 2012-04-17 DIAGNOSIS — E785 Hyperlipidemia, unspecified: Secondary | ICD-10-CM | POA: Insufficient documentation

## 2012-04-17 DIAGNOSIS — R109 Unspecified abdominal pain: Secondary | ICD-10-CM | POA: Diagnosis not present

## 2012-04-17 NOTE — ED Provider Notes (Signed)
History  Scribed for Veryl Speak, MD, the patient was seen in room APA06/APA06. This chart was scribed by Truddie Coco. The patient's care started at 6:43 PM    CSN: [id number]  Arrival date & time 04/17/12  1634   First MD Initiated Contact with Patient 04/17/12 1837      Chief Complaint  Patient presents with  . Constipation    The history is provided by the patient.   Katherine [name] is a 78 y.o. female who presents to the Emergency Department complaining of constipation that started about five days ago.  She is also experiencing nausea and back pain.  Pt has had these sx before.  She has taken mag citrate with no relief.  Pt has h/o open heart surgery.    Past Medical History  Diagnosis Date  . Gastric mass   . Hernia of unspecified site of abdominal cavity without mention of obstruction or gangrene     hiatal  . Gastric ulcer   . Thyroid mass   . GERD (gastroesophageal reflux disease)   . Anxiety   . Coronary artery disease   . Osteoarthritis   . Anemia   . Hypertension   . Hyperlipidemia   . Diabetes mellitus, type 2     Past Surgical History  Procedure Date  . Coronary artery bypass graft     x4 in 2003  . Tubal ligation   . Knee surgery   . Uterine polypectomy   . Internal hemorrhoidectomy     Family History  Problem Relation Age of Onset  . Colon cancer Neg Hx   . Diabetes Mother   . Diabetes Father   . Heart disease Mother   . Stroke Father     History  Substance Use Topics  . Smoking status: Never Smoker   . Smokeless tobacco: Not on file  . Alcohol Use: No    OB History    Grav Para Term Preterm Abortions TAB SAB Ect Mult Living                  Review of Systems  Gastrointestinal: Positive for nausea, constipation and blood in stool. Negative for vomiting and abdominal pain.  Musculoskeletal: Positive for back pain.  All other systems reviewed and are negative.    Allergies  Review of patient's allergies indicates no  known allergies.  Home Medications   Current Outpatient Rx  Name Route Sig Dispense Refill  . ACETAMINOPHEN 500 MG PO TABS Oral Take 500 mg by mouth. Take one by mouth in morning and one at bedtime     . IPRATROPIUM-ALBUTEROL 18-103 MCG/ACT IN AERO Inhalation Inhale 2 puffs into the lungs every 6 (six) hours as needed.      Marland Kitchen AMLODIPINE BESYLATE 5 MG PO TABS Oral Take 10 mg by mouth daily.     . ASPIRIN 81 MG PO TABS Oral Take 81 mg by mouth daily.      . ENALAPRIL MALEATE 20 MG PO TABS Oral Take 20 mg by mouth daily.      Marland Kitchen ESOMEPRAZOLE MAGNESIUM 40 MG PO CPDR  Take 1 capsule 30 min before breakfast. Lot # [id number] Exp date: 05/2014  30 capsule 0  . EZETIMIBE-SIMVASTATIN 10-10 MG PO TABS Oral Take 1 tablet by mouth at bedtime.      Marland Kitchen HYOSCYAMINE SULFATE 0.125 MG PO TABS Oral Take 0.125 mg by mouth. Take 1 sublingual three times a day prn     . INSULIN DETEMIR 100  UNIT/ML [place] SOLN Subcutaneous Inject 60 Units into the skin daily.      Marland Kitchen MECLIZINE HCL 12.5 MG PO TABS Oral Take 12.5 mg by mouth. One tablet by mouth as needed     . METFORMIN HCL 500 MG PO TABS Oral Take 500 mg by mouth 2 (two) times daily with a meal.      . METOPROLOL SUCCINATE ER 50 MG PO TB24 Oral Take 50 mg by mouth daily. Take 1/2 tab daily     . PANTOPRAZOLE SODIUM 40 MG PO TBEC Oral Take 40 mg by mouth daily.      Marland Kitchen POTASSIUM CHLORIDE 20 MEQ PO PACK Oral Take 20 mEq by mouth daily.      Marland Kitchen PROMETHAZINE HCL 25 MG PO TABS Oral Take 25 mg by mouth every 6 (six) hours as needed.      Marland Kitchen ROSUVASTATIN CALCIUM 20 MG PO TABS Oral Take 20 mg by mouth daily.      . TRIAMTERENE-HCTZ 37.5-25 MG PO CAPS Oral Take 1 capsule by mouth every morning.        BP 120/53  Pulse 73  Temp(Src) 97.6 F (36.4 C) (Oral)  Resp 20  Ht 5\' 2"  (1.575 m)  Wt 219 lb (99.338 kg)  BMI 40.06 kg/m2  SpO2 98%  Physical Exam  Nursing note and vitals reviewed. Constitutional: She is oriented to person, place, and time. She appears well-developed and  well-nourished. No distress.  HENT:  Head: Normocephalic and atraumatic.  Eyes: Conjunctivae are normal. Pupils are equal, round, and reactive to light.  Neck: Normal range of motion. Neck supple.  Cardiovascular:       3/6 ejection murmur hear best at the sternal border.   Pulmonary/Chest: Effort normal.  Abdominal: Soft. There is no tenderness.  Musculoskeletal: Normal range of motion. She exhibits no edema.  Neurological: She is alert and oriented to person, place, and time.  Skin: Skin is warm and dry. She is not diaphoretic.  Psychiatric: She has a normal mood and affect. Her behavior is normal.    ED Course  Procedures  DIAGNOSTIC STUDIES: Oxygen Saturation is 98% on room air, normal by my interpretation.    COORDINATION OF CARE:  7:00PM Ordered: DG Abd 1 View  8:29 PM Discussed image results and course of care with pt.    Labs Reviewed - No data to display Dg Abd 1 View  04/17/2012  *RADIOLOGY REPORT*  Clinical Data: Abdominal pain, constipation  ABDOMEN - 1 VIEW  Comparison: [facility] abdomen pelvis dated 11/13/2008  Findings: Nonobstructive bowel gas pattern.  Moderate stool in the distal colon/rectum.  Calcified uterine fibroids.  Degenerative changes of the lumbar spine.  Stable compression deformity at L1.  IMPRESSION: No evidence of bowel obstruction.  Moderate stool in the distal colon/rectum.  Original Report Authenticated By: Julian Hy, M.D.     No diagnosis found.    MDM  The xrays show stool in the colon but no evidence for obstruction.  She will be discharged and recommended to try enemas at home.  Return prn if worsens.   I personally performed the services described in this documentation, which was scribed in my presence. The recorded information has been reviewed and considered.          Veryl Speak, MD 04/17/12 2033

## 2012-04-17 NOTE — Discharge Instructions (Signed)
  Fleets enema as per package directions. Metamucil:  One heaping teaspoon in a glass of water three times daily. Colace 100 mg twice daily while taking your pain medications.    Constipation in Adults Constipation is having fewer than 2 bowel movements per week. Usually, the stools are hard. As we grow older, constipation is more common. If you try to fix constipation with laxatives, the problem may get worse. This is because laxatives taken over a long period of time make the colon muscles weaker. A low-fiber diet, not taking in enough fluids, and taking some medicines may make these problems worse. MEDICATIONS THAT MAY CAUSE CONSTIPATION  Water pills (diuretics).   Calcium channel blockers (used to control blood pressure and for the heart).   Certain pain medicines (narcotics).   Anticholinergics.   Anti-inflammatory agents.   Antacids that contain aluminum.  DISEASES THAT CONTRIBUTE TO CONSTIPATION  Diabetes.   Parkinson's disease.   Dementia.   Stroke.   Depression.   Illnesses that cause problems with salt and water metabolism.  HOME CARE INSTRUCTIONS   Constipation is usually best cared for without medicines. Increasing dietary fiber and eating more fruits and vegetables is the best way to manage constipation.   Slowly increase fiber intake to 25 to 38 grams per day. Whole grains, fruits, vegetables, and legumes are good sources of fiber. A dietitian can further help you incorporate high-fiber foods into your diet.   Drink enough water and fluids to keep your urine clear or pale yellow.   A fiber supplement may be added to your diet if you cannot get enough fiber from foods.   Increasing your activities also helps improve regularity.   Suppositories, as suggested by your caregiver, will also help. If you are using antacids, such as aluminum or calcium containing products, it will be helpful to switch to products containing magnesium if your caregiver says it is  okay.   If you have been given a liquid injection (enema) today, this is only a temporary measure. It should not be relied on for treatment of longstanding (chronic) constipation.   Stronger measures, such as magnesium sulfate, should be avoided if possible. This may cause uncontrollable diarrhea. Using magnesium sulfate may not allow you time to make it to the bathroom.  SEEK IMMEDIATE MEDICAL CARE IF:   There is bright red blood in the stool.   The constipation stays for more than 4 days.   There is belly (abdominal) or rectal pain.   You do not seem to be getting better.   You have any questions or concerns.  MAKE SURE YOU:   Understand these instructions.   Will watch your condition.   Will get help right away if you are not doing well or get worse.  Document Released: 08/19/2004 Document Revised: 11/10/2011 Document Reviewed: 10/25/2011 [facility] Patient Information 2012 [place], [place].

## 2012-04-17 NOTE — ED Notes (Signed)
Constipation since Friday, took laxative,without relief.  Nausea.  Back pain

## 2012-04-26 DIAGNOSIS — E782 Mixed hyperlipidemia: Secondary | ICD-10-CM | POA: Diagnosis not present

## 2012-04-26 DIAGNOSIS — M1990 Unspecified osteoarthritis, unspecified site: Secondary | ICD-10-CM | POA: Diagnosis not present

## 2012-04-26 DIAGNOSIS — E119 Type 2 diabetes mellitus without complications: Secondary | ICD-10-CM | POA: Diagnosis not present

## 2012-04-26 DIAGNOSIS — I359 Nonrheumatic aortic valve disorder, unspecified: Secondary | ICD-10-CM | POA: Diagnosis not present

## 2012-04-26 DIAGNOSIS — I10 Essential (primary) hypertension: Secondary | ICD-10-CM | POA: Diagnosis not present

## 2012-05-09 DIAGNOSIS — M19079 Primary osteoarthritis, unspecified ankle and foot: Secondary | ICD-10-CM | POA: Diagnosis not present

## 2012-05-09 DIAGNOSIS — B353 Tinea pedis: Secondary | ICD-10-CM | POA: Diagnosis not present

## 2012-05-09 DIAGNOSIS — R609 Edema, unspecified: Secondary | ICD-10-CM | POA: Diagnosis not present

## 2012-05-09 DIAGNOSIS — E119 Type 2 diabetes mellitus without complications: Secondary | ICD-10-CM | POA: Diagnosis not present

## 2012-07-09 DIAGNOSIS — M533 Sacrococcygeal disorders, not elsewhere classified: Secondary | ICD-10-CM | POA: Diagnosis not present

## 2012-07-09 DIAGNOSIS — M47817 Spondylosis without myelopathy or radiculopathy, lumbosacral region: Secondary | ICD-10-CM | POA: Diagnosis not present

## 2012-07-09 DIAGNOSIS — M76899 Other specified enthesopathies of unspecified lower limb, excluding foot: Secondary | ICD-10-CM | POA: Diagnosis not present

## 2012-07-11 DIAGNOSIS — R32 Unspecified urinary incontinence: Secondary | ICD-10-CM | POA: Diagnosis not present

## 2012-07-11 DIAGNOSIS — N390 Urinary tract infection, site not specified: Secondary | ICD-10-CM | POA: Diagnosis not present

## 2012-07-11 DIAGNOSIS — F411 Generalized anxiety disorder: Secondary | ICD-10-CM | POA: Diagnosis not present

## 2012-07-30 DIAGNOSIS — E119 Type 2 diabetes mellitus without complications: Secondary | ICD-10-CM | POA: Diagnosis not present

## 2012-07-30 DIAGNOSIS — H1045 Other chronic allergic conjunctivitis: Secondary | ICD-10-CM | POA: Diagnosis not present

## 2012-07-30 DIAGNOSIS — I10 Essential (primary) hypertension: Secondary | ICD-10-CM | POA: Diagnosis not present

## 2012-07-30 DIAGNOSIS — E782 Mixed hyperlipidemia: Secondary | ICD-10-CM | POA: Diagnosis not present

## 2012-07-30 DIAGNOSIS — E785 Hyperlipidemia, unspecified: Secondary | ICD-10-CM | POA: Diagnosis not present

## 2012-08-31 DIAGNOSIS — I10 Essential (primary) hypertension: Secondary | ICD-10-CM | POA: Diagnosis not present

## 2012-08-31 DIAGNOSIS — Z23 Encounter for immunization: Secondary | ICD-10-CM | POA: Diagnosis not present

## 2012-08-31 DIAGNOSIS — R252 Cramp and spasm: Secondary | ICD-10-CM | POA: Diagnosis not present

## 2012-09-04 DIAGNOSIS — M47817 Spondylosis without myelopathy or radiculopathy, lumbosacral region: Secondary | ICD-10-CM | POA: Diagnosis not present

## 2012-09-04 DIAGNOSIS — M533 Sacrococcygeal disorders, not elsewhere classified: Secondary | ICD-10-CM | POA: Diagnosis not present

## 2012-09-21 DIAGNOSIS — I10 Essential (primary) hypertension: Secondary | ICD-10-CM | POA: Diagnosis not present

## 2012-09-21 DIAGNOSIS — R252 Cramp and spasm: Secondary | ICD-10-CM | POA: Diagnosis not present

## 2012-10-16 DIAGNOSIS — J069 Acute upper respiratory infection, unspecified: Secondary | ICD-10-CM | POA: Diagnosis not present

## 2012-10-22 ENCOUNTER — Other Ambulatory Visit (HOSPITAL_COMMUNITY): Payer: Self-pay | Admitting: Internal Medicine

## 2012-10-22 ENCOUNTER — Ambulatory Visit (HOSPITAL_COMMUNITY)
Admission: RE | Admit: 2012-10-22 | Discharge: 2012-10-22 | Disposition: A | Discharge status: Home / Self Care | Payer: Medicare Other | Source: Ambulatory Visit | Attending: Internal Medicine | Admitting: Internal Medicine

## 2012-10-22 DIAGNOSIS — I359 Nonrheumatic aortic valve disorder, unspecified: Secondary | ICD-10-CM

## 2012-10-22 DIAGNOSIS — I2510 Atherosclerotic heart disease of native coronary artery without angina pectoris: Secondary | ICD-10-CM | POA: Diagnosis not present

## 2012-10-22 NOTE — Progress Notes (Signed)
2D Echo Performed 10/22/2012     Marygrace Drought, RCS

## 2012-10-25 DIAGNOSIS — I359 Nonrheumatic aortic valve disorder, unspecified: Secondary | ICD-10-CM | POA: Diagnosis not present

## 2012-10-25 DIAGNOSIS — E669 Obesity, unspecified: Secondary | ICD-10-CM | POA: Diagnosis not present

## 2012-10-25 DIAGNOSIS — I10 Essential (primary) hypertension: Secondary | ICD-10-CM | POA: Diagnosis not present

## 2012-10-25 DIAGNOSIS — E119 Type 2 diabetes mellitus without complications: Secondary | ICD-10-CM | POA: Diagnosis not present

## 2012-11-12 DIAGNOSIS — M47817 Spondylosis without myelopathy or radiculopathy, lumbosacral region: Secondary | ICD-10-CM | POA: Diagnosis not present

## 2012-11-12 DIAGNOSIS — M533 Sacrococcygeal disorders, not elsewhere classified: Secondary | ICD-10-CM | POA: Diagnosis not present

## 2012-11-12 DIAGNOSIS — M76899 Other specified enthesopathies of unspecified lower limb, excluding foot: Secondary | ICD-10-CM | POA: Diagnosis not present

## 2012-11-16 DIAGNOSIS — I10 Essential (primary) hypertension: Secondary | ICD-10-CM | POA: Diagnosis not present

## 2012-11-16 DIAGNOSIS — E119 Type 2 diabetes mellitus without complications: Secondary | ICD-10-CM | POA: Diagnosis not present

## 2012-11-16 DIAGNOSIS — K5900 Constipation, unspecified: Secondary | ICD-10-CM | POA: Diagnosis not present

## 2012-12-24 DIAGNOSIS — K2970 Gastritis, unspecified, without bleeding: Secondary | ICD-10-CM | POA: Diagnosis not present

## 2012-12-24 DIAGNOSIS — B353 Tinea pedis: Secondary | ICD-10-CM | POA: Diagnosis not present

## 2012-12-24 DIAGNOSIS — K2990 Gastroduodenitis, unspecified, without bleeding: Secondary | ICD-10-CM | POA: Diagnosis not present

## 2013-02-12 DIAGNOSIS — I10 Essential (primary) hypertension: Secondary | ICD-10-CM | POA: Diagnosis not present

## 2013-03-25 DIAGNOSIS — M533 Sacrococcygeal disorders, not elsewhere classified: Secondary | ICD-10-CM | POA: Diagnosis not present

## 2013-03-25 DIAGNOSIS — M47817 Spondylosis without myelopathy or radiculopathy, lumbosacral region: Secondary | ICD-10-CM | POA: Diagnosis not present

## 2013-04-12 ENCOUNTER — Encounter: Payer: Self-pay | Admitting: Internal Medicine

## 2013-04-22 ENCOUNTER — Encounter: Payer: Self-pay | Admitting: Internal Medicine

## 2013-04-22 ENCOUNTER — Ambulatory Visit (INDEPENDENT_AMBULATORY_CARE_PROVIDER_SITE_OTHER): Payer: Medicare Other | Admitting: Internal Medicine

## 2013-04-22 VITALS — BP 122/68 | HR 62 | Ht 62.0 in | Wt 228.0 lb

## 2013-04-22 DIAGNOSIS — I509 Heart failure, unspecified: Secondary | ICD-10-CM | POA: Diagnosis not present

## 2013-04-22 DIAGNOSIS — E119 Type 2 diabetes mellitus without complications: Secondary | ICD-10-CM

## 2013-04-22 DIAGNOSIS — I350 Nonrheumatic aortic (valve) stenosis: Secondary | ICD-10-CM | POA: Insufficient documentation

## 2013-04-22 DIAGNOSIS — Z951 Presence of aortocoronary bypass graft: Secondary | ICD-10-CM | POA: Diagnosis not present

## 2013-04-22 DIAGNOSIS — I359 Nonrheumatic aortic valve disorder, unspecified: Secondary | ICD-10-CM

## 2013-04-22 DIAGNOSIS — Z8711 Personal history of peptic ulcer disease: Secondary | ICD-10-CM | POA: Diagnosis not present

## 2013-04-22 DIAGNOSIS — I2510 Atherosclerotic heart disease of native coronary artery without angina pectoris: Secondary | ICD-10-CM | POA: Diagnosis not present

## 2013-04-22 NOTE — Patient Instructions (Addendum)
Your physician wants you to follow-up in: 6 months You will receive a reminder letter in the mail two months in advance. If you don't receive a letter, please call our office to schedule the follow-up appointment. Your physician has requested that you have an echocardiogram. Echocardiography is a painless test that uses sound waves to create images of your heart. It provides your doctor with information about the size and shape of your heart and how well your heart's chambers and valves are working. This procedure takes approximately one hour. There are no restrictions for this procedure. This should be done in 6 month

## 2013-04-22 NOTE — Progress Notes (Signed)
[facility]          OFFICE NOTE   Chief Complaint:  Routine follow-up visit  Primary Care Physician: Lanette Hampshire, MD  HPI:  Katherine [name] is an 79 year old female with a history of coronary disease status post CABG in 2003, morbid obesity, hypertension, dyslipidemia, and diabetes type 2. She had valvular aortic stenosis with a sign of increased gradient on her last echo in November. We repeated that echo on October 22, 2012 which showed actually no significant change in her aortic valve gradient with a valve area of about 1.2 cm, and peak and mean gradients of 45 and 24 mmHg. This is similar to findings in 2012. I suspect that we may have had an incomplete envelop and that her true gradients are closer to 50 and 30 mmHg. However, this would still yield moderate to severe aortic stenosis. Overall though she is asymptomatic at this point and there is no clear recommendation for surgery or procedures at this time.  She has had a few pounds of weight gain over the winter, but is generally asymptomatic. No complaints of chest pain, dyspnea, presyncope or syncope.  PMHx:  Past Medical History  Diagnosis Date  . Gastric mass   . Hernia of unspecified site of abdominal cavity without mention of obstruction or gangrene     hiatal  . Gastric ulcer   . Thyroid mass   . GERD (gastroesophageal reflux disease)   . Anxiety   . Coronary artery disease   . Osteoarthritis   . Anemia   . Hypertension   . Hyperlipidemia   . Diabetes mellitus, type 2   . Aortic valvular stenosis     moderate to severe  EF on 2D Echo 10/22/2012 65-70%  valve area of 1.2cm, and peak and mean gradients of 45 and 83mmHg    Past Surgical History  Procedure Laterality Date  . Coronary artery bypass graft      x4 in 2003  . Tubal ligation    . Knee surgery    . Uterine polypectomy    . Internal hemorrhoidectomy      FAMHx:  Family History  Problem Relation Age of Onset   . Colon cancer Neg Hx   . Diabetes Mother   . Diabetes Father   . Heart disease Mother   . Stroke Father   . Stroke Mother   . Heart attack Brother     SOCHx:   reports that she has never smoked. She does not have any smokeless tobacco history on file. She reports that she does not drink alcohol or use illicit drugs.  ALLERGIES:  No Known Allergies  ROS: A comprehensive review of systems was negative except for: Constitutional: positive for weight gain  HOME MEDS: Current Outpatient Prescriptions  Medication Sig Dispense Refill  . acetaminophen (TYLENOL) 500 MG tablet Take 500 mg by mouth. Take one by mouth in morning and one at bedtime       . albuterol-ipratropium (COMBIVENT) 18-103 MCG/ACT inhaler Inhale 2 puffs into the lungs every 6 (six) hours as needed. Shortness of breath      . amLODipine (NORVASC) 10 MG tablet Take 10 mg by mouth daily.      Marland Kitchen BYSTOLIC 5 MG tablet Take 5 mg by mouth daily.      . Calcium Carb-Cholecalciferol (CALTRATE 600+D) 600-800 MG-UNIT TABS Take 1 tablet by mouth 2 (two) times daily.      . enalapril (VASOTEC) 20 MG  tablet Take 20 mg by mouth daily.        Marland Kitchen esomeprazole (NEXIUM) 40 MG capsule Take 1 capsule 30 min before breakfast. Lot # [id number] Exp date: 05/2014   30 capsule  0  . fluocinonide cream (LIDEX) AB-123456789 % Apply 1 application topically 2 (two) times daily.      Marland Kitchen HYDROcodone-acetaminophen (NORCO) 5-325 MG per tablet Take 1 tablet by mouth every 8 (eight) hours as needed. pain      . insulin detemir (LEVEMIR FLEXPEN) 100 UNIT/ML injection Inject 60 Units into the skin daily.        . metFORMIN (GLUCOPHAGE) 500 MG tablet Take 500 mg by mouth daily.       . metoprolol (TOPROL-XL) 50 MG 24 hr tablet Take 25 mg by mouth 2 (two) times daily. Take 1/2 tab daily      . Omega-3 Fatty Acids (FISH OIL) 1000 MG CAPS Take 1 capsule by mouth daily.      . potassium chloride SA (K-DUR,KLOR-CON) 20 MEQ tablet Take 20 mEq by mouth daily.      .  rosuvastatin (CRESTOR) 20 MG tablet Take 20 mg by mouth daily.        Marland Kitchen triamterene-hydrochlorothiazide (DYAZIDE) 37.5-25 MG per capsule Take 1 capsule by mouth every morning.        Marland Kitchen VOLTAREN 1 % GEL Apply 1 application topically once.       Current Facility-Administered Medications  Medication Dose Route Frequency Provider Last Rate Last Dose  . 0.9 %  sodium chloride infusion  500 mL Intravenous Continuous Lafayette Dragon, MD        LABS/IMAGING: No results found for this or any previous visit (from the past 48 hour(s)). No results found.  VITALS: BP 122/68  Pulse 62  Ht 5\' 2"  (1.575 m)  Wt 228 lb (103.42 kg)  BMI 41.69 kg/m2  EXAM: General appearance: alert and no distress Neck: no adenopathy, no carotid bruit, no JVD, supple, symmetrical, trachea midline and thyroid not enlarged, symmetric, no tenderness/mass/nodules Lungs: clear to auscultation bilaterally Heart: regular rate and rhythm, S1, S2 normal and systolic murmur: late systolic 3/6, crescendo at 2nd right intercostal space Abdomen: soft, non-tender; bowel sounds normal; no masses,  no organomegaly Extremities: extremities normal, atraumatic, no cyanosis or edema Pulses: 2+ and symmetric Skin: Skin color, texture, turgor normal. No rashes or lesions Neurologic: Grossly normal  EKG: Normal sinus rhythm with sinus arrythmia at 62  ASSESSMENT: 1. CAD s/p CABG in 2003 (4 vessel) 2. Moderate aortic stenosis 3. Morbid obesity 4. History of GI ulcer/bleeding - not on aspirin due to this 5. HTN - controlled 6. DM2  PLAN: 1.   [name] continues to do fairly well, however, she is gaining weight. I have asked her to watch her diet and get more exercise. There are no signs of heart failure. Her aortic stenosis appears stable, however, annual echocardiograms are in order as the valve is on the severe-end of moderate (30 mmHg mean gradient).  Plan follow-up in 6 months with a repeat echocardiogram which I will review  with her.  [name], MD, [facility] Attending Cardiologist [facility] 04/22/2013, 4:03 PM

## 2013-05-06 DIAGNOSIS — R197 Diarrhea, unspecified: Secondary | ICD-10-CM | POA: Diagnosis not present

## 2013-06-11 DIAGNOSIS — I10 Essential (primary) hypertension: Secondary | ICD-10-CM | POA: Diagnosis not present

## 2013-06-11 DIAGNOSIS — E782 Mixed hyperlipidemia: Secondary | ICD-10-CM | POA: Diagnosis not present

## 2013-06-11 DIAGNOSIS — E119 Type 2 diabetes mellitus without complications: Secondary | ICD-10-CM | POA: Diagnosis not present

## 2013-06-18 DIAGNOSIS — M25569 Pain in unspecified knee: Secondary | ICD-10-CM | POA: Diagnosis not present

## 2013-06-18 DIAGNOSIS — E782 Mixed hyperlipidemia: Secondary | ICD-10-CM | POA: Diagnosis not present

## 2013-06-18 DIAGNOSIS — I10 Essential (primary) hypertension: Secondary | ICD-10-CM | POA: Diagnosis not present

## 2013-07-16 DIAGNOSIS — E119 Type 2 diabetes mellitus without complications: Secondary | ICD-10-CM | POA: Diagnosis not present

## 2013-07-16 DIAGNOSIS — I10 Essential (primary) hypertension: Secondary | ICD-10-CM | POA: Diagnosis not present

## 2013-08-08 DIAGNOSIS — M47817 Spondylosis without myelopathy or radiculopathy, lumbosacral region: Secondary | ICD-10-CM | POA: Diagnosis not present

## 2013-08-08 DIAGNOSIS — M533 Sacrococcygeal disorders, not elsewhere classified: Secondary | ICD-10-CM | POA: Diagnosis not present

## 2013-08-29 DIAGNOSIS — K5900 Constipation, unspecified: Secondary | ICD-10-CM | POA: Diagnosis not present

## 2013-09-27 DIAGNOSIS — E782 Mixed hyperlipidemia: Secondary | ICD-10-CM | POA: Diagnosis not present

## 2013-09-27 DIAGNOSIS — E119 Type 2 diabetes mellitus without complications: Secondary | ICD-10-CM | POA: Diagnosis not present

## 2013-09-27 DIAGNOSIS — I10 Essential (primary) hypertension: Secondary | ICD-10-CM | POA: Diagnosis not present

## 2013-10-01 DIAGNOSIS — E782 Mixed hyperlipidemia: Secondary | ICD-10-CM | POA: Diagnosis not present

## 2013-10-01 DIAGNOSIS — F411 Generalized anxiety disorder: Secondary | ICD-10-CM | POA: Diagnosis not present

## 2013-10-01 DIAGNOSIS — I10 Essential (primary) hypertension: Secondary | ICD-10-CM | POA: Diagnosis not present

## 2013-10-14 ENCOUNTER — Ambulatory Visit: Payer: Medicare Other | Admitting: Internal Medicine

## 2013-10-15 DIAGNOSIS — M25569 Pain in unspecified knee: Secondary | ICD-10-CM | POA: Diagnosis not present

## 2013-10-15 DIAGNOSIS — M6281 Muscle weakness (generalized): Secondary | ICD-10-CM | POA: Diagnosis not present

## 2013-10-15 DIAGNOSIS — R32 Unspecified urinary incontinence: Secondary | ICD-10-CM | POA: Diagnosis not present

## 2013-10-15 DIAGNOSIS — G3184 Mild cognitive impairment, so stated: Secondary | ICD-10-CM | POA: Diagnosis not present

## 2013-10-16 ENCOUNTER — Other Ambulatory Visit (HOSPITAL_COMMUNITY): Payer: Self-pay | Admitting: Internal Medicine

## 2013-10-16 DIAGNOSIS — R29898 Other symptoms and signs involving the musculoskeletal system: Secondary | ICD-10-CM

## 2013-10-16 DIAGNOSIS — G3184 Mild cognitive impairment, so stated: Secondary | ICD-10-CM

## 2013-10-17 ENCOUNTER — Ambulatory Visit: Payer: Medicare Other | Admitting: Internal Medicine

## 2013-10-17 DIAGNOSIS — R32 Unspecified urinary incontinence: Secondary | ICD-10-CM | POA: Diagnosis not present

## 2013-10-18 ENCOUNTER — Ambulatory Visit (HOSPITAL_COMMUNITY)
Admission: RE | Admit: 2013-10-18 | Discharge: 2013-10-18 | Disposition: A | Discharge status: Home / Self Care | Payer: Medicare Other | Source: Ambulatory Visit | Attending: Internal Medicine | Admitting: Internal Medicine

## 2013-10-18 DIAGNOSIS — G3184 Mild cognitive impairment, so stated: Secondary | ICD-10-CM | POA: Insufficient documentation

## 2013-10-18 DIAGNOSIS — E119 Type 2 diabetes mellitus without complications: Secondary | ICD-10-CM | POA: Insufficient documentation

## 2013-10-18 DIAGNOSIS — G319 Degenerative disease of nervous system, unspecified: Secondary | ICD-10-CM | POA: Insufficient documentation

## 2013-10-18 DIAGNOSIS — E785 Hyperlipidemia, unspecified: Secondary | ICD-10-CM | POA: Insufficient documentation

## 2013-10-18 DIAGNOSIS — R030 Elevated blood-pressure reading, without diagnosis of hypertension: Secondary | ICD-10-CM | POA: Diagnosis not present

## 2013-10-18 DIAGNOSIS — R29818 Other symptoms and signs involving the nervous system: Secondary | ICD-10-CM | POA: Diagnosis not present

## 2013-10-18 DIAGNOSIS — R29898 Other symptoms and signs involving the musculoskeletal system: Secondary | ICD-10-CM | POA: Insufficient documentation

## 2013-10-24 ENCOUNTER — Ambulatory Visit (INDEPENDENT_AMBULATORY_CARE_PROVIDER_SITE_OTHER): Payer: Medicare Other | Admitting: Internal Medicine

## 2013-10-24 ENCOUNTER — Encounter: Payer: Self-pay | Admitting: Internal Medicine

## 2013-10-24 VITALS — BP 162/80 | HR 68 | Ht 62.0 in | Wt 221.2 lb

## 2013-10-24 DIAGNOSIS — E119 Type 2 diabetes mellitus without complications: Secondary | ICD-10-CM

## 2013-10-24 DIAGNOSIS — I509 Heart failure, unspecified: Secondary | ICD-10-CM

## 2013-10-24 DIAGNOSIS — I2510 Atherosclerotic heart disease of native coronary artery without angina pectoris: Secondary | ICD-10-CM

## 2013-10-24 DIAGNOSIS — I350 Nonrheumatic aortic (valve) stenosis: Secondary | ICD-10-CM

## 2013-10-24 DIAGNOSIS — I10 Essential (primary) hypertension: Secondary | ICD-10-CM

## 2013-10-24 DIAGNOSIS — Z951 Presence of aortocoronary bypass graft: Secondary | ICD-10-CM | POA: Diagnosis not present

## 2013-10-24 DIAGNOSIS — I359 Nonrheumatic aortic valve disorder, unspecified: Secondary | ICD-10-CM | POA: Diagnosis not present

## 2013-10-24 NOTE — Progress Notes (Signed)
OFFICE NOTE  Chief Complaint:  Routine follow-up, recent UTI, confusion  Primary Care Physician: Delphina Cahill, MD  HPI:  Katherine [name] is an 79 year old female with a history of coronary disease status post CABG in 2003, morbid obesity, hypertension, dyslipidemia, and diabetes type 2. She had valvular aortic stenosis with a sign of increased gradient on her last echo in November. We repeated that echo on October 22, 2012 which showed actually no significant change in her aortic valve gradient with a valve area of about 1.2 cm, and peak and mean gradients of 45 and 24 mmHg. This is similar to findings in 2012. I suspect that we may have had an incomplete envelop and that her true gradients are closer to 50 and 30 mmHg. However, this would still yield moderate to severe aortic stenosis. Overall though she is asymptomatic at this point and there is no clear recommendation for surgery or procedures at this time. He reports that recently she had a urinary tract infection which is associated with some confusion. Ultimately this is treated and she rebounded quite quickly. She denies any chest pain or worsening shortness of breath. She has had no syncopal episodes. She is stated in the past but she is not interested in a repeat open heart surgical procedure, nor do I think she is a good candidate for that.  PMHx:  Past Medical History  Diagnosis Date  . Gastric mass   . Hernia of unspecified site of abdominal cavity without mention of obstruction or gangrene     hiatal  . Gastric ulcer   . Thyroid mass   . GERD (gastroesophageal reflux disease)   . Anxiety   . Coronary artery disease   . Osteoarthritis   . Anemia   . Hypertension   . Hyperlipidemia   . Diabetes mellitus, type 2   . Aortic valvular stenosis     moderate to severe  EF on 2D Echo 10/22/2012 65-70%  valve area of 1.2cm, and peak and mean gradients of 45 and 46mmHg    Past Surgical History  Procedure Laterality Date    . Coronary artery bypass graft      x4 in 2003  . Tubal ligation    . Knee surgery    . Uterine polypectomy    . Internal hemorrhoidectomy      FAMHx:  Family History  Problem Relation Age of Onset  . Colon cancer Neg Hx   . Diabetes Mother   . Diabetes Father   . Heart disease Mother   . Stroke Father   . Stroke Mother   . Heart attack Brother     SOCHx:   reports that she has never smoked. She has never used smokeless tobacco. She reports that she does not drink alcohol or use illicit drugs.  ALLERGIES:  No Known Allergies  ROS: A comprehensive review of systems was negative except for: Constitutional: positive for confusion Respiratory: positive for dyspnea on exertion Cardiovascular: positive for lower extremity edema Genitourinary: positive for recent UTI  HOME MEDS: Current Outpatient Prescriptions  Medication Sig Dispense Refill  . acetaminophen (TYLENOL) 500 MG tablet Take 500 mg by mouth. Take one by mouth in morning and one at bedtime       . albuterol-ipratropium (COMBIVENT) 18-103 MCG/ACT inhaler Inhale 2 puffs into the lungs every 6 (six) hours as needed. Shortness of breath      . ALPRAZolam (XANAX) 0.25 MG tablet Take 0.5-1 tablets by mouth 2 (two) times daily  as needed.      Marland Kitchen BYSTOLIC 5 MG tablet Take 10 mg by mouth daily.       . Calcium Carb-Cholecalciferol (CALTRATE 600+D) 600-800 MG-UNIT TABS Take 1 tablet by mouth 2 (two) times daily.      Marland Kitchen docusate sodium (COLACE) 100 MG capsule Take 100 mg by mouth daily as needed for mild constipation.      Marland Kitchen esomeprazole (NEXIUM) 20 MG capsule Take 20 mg by mouth daily at 12 noon.      . fluocinonide cream (LIDEX) [id number] % Apply 1 application topically 2 (two) times daily.      Marland Kitchen HYDROcodone-acetaminophen (NORCO) 5-325 MG per tablet Take 1 tablet by mouth every 8 (eight) hours as needed. pain      . insulin detemir (LEVEMIR FLEXPEN) 100 UNIT/ML injection Inject 40 Units into the skin 2 (two) times daily.       .  meloxicam (MOBIC) 7.5 MG tablet Take 1 tablet by mouth daily.      . metFORMIN (GLUCOPHAGE) 500 MG tablet Take 500 mg by mouth 2 (two) times daily with a meal.       . Multiple Vitamins-Minerals (MULTIVITAL) CHEW Chew 1 each by mouth daily.      . nitrofurantoin (MACRODANTIN) 100 MG capsule Take 1 capsule by mouth 2 (two) times daily.      . Omega-3 Fatty Acids (FISH OIL) 1000 MG CAPS Take 1 capsule by mouth daily.      . potassium chloride SA (K-DUR,KLOR-CON) 20 MEQ tablet Take 20 mEq by mouth daily.      . rosuvastatin (CRESTOR) 20 MG tablet Take 20 mg by mouth daily.        Marland Kitchen triamterene-hydrochlorothiazide (DYAZIDE) 37.5-25 MG per capsule Take 1 capsule by mouth every morning.        Marland Kitchen VOLTAREN 1 % GEL Apply 1 application topically once.       Current Facility-Administered Medications  Medication Dose Route Frequency Provider Last Rate Last Dose  . 0.9 %  sodium chloride infusion  500 mL Intravenous Continuous Lafayette Dragon, MD        LABS/IMAGING: No results found for this or any previous visit (from the past 48 hour(s)). No results found.  VITALS: BP 201/92  Pulse 68  Ht 5\' 2"  (1.575 m)  Wt 221 lb 3.2 oz (100.336 kg)  BMI 40.45 kg/m2  EXAM: General appearance: alert and no distress Neck: no carotid bruit and no JVD Lungs: clear to auscultation bilaterally Heart: regular rate and rhythm, S1, soft S2, late-peaking 3/6  Musical SEM at the RUSB, radiates to neck base Abdomen: soft, non-tender; bowel sounds normal; no masses,  no organomegaly and morbidly obese Extremities: edema trace bilateral Pulses: 2+ and symmetric Skin: Skin color, texture, turgor normal. No rashes or lesions Neurologic: Grossly normal Psych: Normal mood, affect  EKG: Normal sinus rhythm at 68  ASSESSMENT: 1. Moderate to more likely severe aortic stenosis 2. Morbid obesity 3. History of coronary artery disease status post CABG in 2003 4. Hypertension - repeat BP was  162/80 5. Dyslipidemia 6. Diabetes type 2  PLAN: 1.   Katherine [name] has a worsening aortic murmur concerning for now severe aortic stenosis. Her last echocardiogram was in 2013 which showed peak and mean gradients that were in the moderate range. I would recommend repeating that study as I suspect she is now in the severe range. She apparently remains asymptomatic, however she does not do much exertion, so we may need  to see if she is provokable. Nevertheless, she is not interested in any further open heart surgery procedures. I briefly discussed TAVR with her today and her daughter, and they may consider this alternative if she were candidate.  I plan to see her back in 6 months for close followup and will contact her with the results of her echocardiogram.  [name], MD, [facility] Attending Cardiologist [facility]  HILTY,Kenneth C 10/24/2013, 4:30 PM

## 2013-10-24 NOTE — Patient Instructions (Addendum)
Please schedule an echocardiogram.  Echocardiography is a painless test that uses sound waves to create images of your heart. It provides your doctor with information about the size and shape of your heart and how well your heart's chambers and valves are working. This procedure takes approximately one hour. There are no restrictions for this procedure.  We will call you with the results. Call [facility]) with results.  Your physician wants you to follow-up in: 6 months. You will receive a reminder letter in the mail two months in advance. If you don't receive a letter, please call our office to schedule the follow-up appointment.

## 2013-10-28 ENCOUNTER — Ambulatory Visit (HOSPITAL_COMMUNITY)
Admission: RE | Admit: 2013-10-28 | Discharge: 2013-10-28 | Disposition: A | Discharge status: Home / Self Care | Payer: Medicare Other | Source: Ambulatory Visit | Attending: Internal Medicine | Admitting: Internal Medicine

## 2013-10-28 DIAGNOSIS — R262 Difficulty in walking, not elsewhere classified: Secondary | ICD-10-CM

## 2013-10-28 DIAGNOSIS — M6281 Muscle weakness (generalized): Secondary | ICD-10-CM | POA: Diagnosis not present

## 2013-10-28 DIAGNOSIS — IMO0001 Reserved for inherently not codable concepts without codable children: Secondary | ICD-10-CM | POA: Diagnosis not present

## 2013-10-28 DIAGNOSIS — G3184 Mild cognitive impairment, so stated: Secondary | ICD-10-CM | POA: Diagnosis not present

## 2013-10-28 DIAGNOSIS — R269 Unspecified abnormalities of gait and mobility: Secondary | ICD-10-CM | POA: Diagnosis not present

## 2013-10-28 NOTE — Evaluation (Signed)
Physical Therapy Evaluation  Patient Details  Name: Katherine [name] MRN: [id number] Date of Birth: 11/07/1928  Today's Date: 10/28/2013 Time: [id number] PT Time Calculation (min): 60 min Charges: 1 evaluation             Visit#: 1 of 8  Re-eval: 11/27/13 Assessment Diagnosis: BLE generalized weakness Next MD Visit: Dr. Nevada Crane - unscheduled   Authorization: Medicare    Authorization Time Period:    Authorization Visit#: 1 of 10   Past Medical History:  Past Medical History  Diagnosis Date  . Gastric mass   . Hernia of unspecified site of abdominal cavity without mention of obstruction or gangrene     hiatal  . Gastric ulcer   . Thyroid mass   . GERD (gastroesophageal reflux disease)   . Anxiety   . Coronary artery disease   . Osteoarthritis   . Anemia   . Hypertension   . Hyperlipidemia   . Diabetes mellitus, type 2   . Aortic valvular stenosis     moderate to severe  EF on 2D Echo 10/22/2012 65-70%  valve area of 1.2cm, and peak and mean gradients of 45 and 67mmHg   Past Surgical History:  Past Surgical History  Procedure Laterality Date  . Coronary artery bypass graft      x4 in 2003  . Tubal ligation    . Knee surgery    . Uterine polypectomy    . Internal hemorrhoidectomy     Subjective Symptoms/Limitations Symptoms: Pt is an 79 year old female referred to PT for BLE generalized weakness and is present today with her daughter (her primary caregiver, Minette Brine).  Minette Brine reports most of history secondary to mild cognitive impairments. She reports they typically live in [place], [place]. Around November 8th they were down in [place] visiting her sister (pt's daughter) and she was able to walk into the house and after awhile she became very weak and was not able to walk out with her cane and required significant assistance.  Since then pt and her daughter have been living in [place] and were able to f/u with Dr. Delphina Cahill in [place], about her current condition  and has been treating her for a kidney infection.  Minette Brine reports she has become a little stronger over the past week and she was able to actually able to walk without her RW.  At this time they have decided to stay in [place] due to pt requires 24/7 supervision and assistance and both daughters work at the same job in [place].  At this time would like information about receiving therapy closer to the current locations in order to decrease the time of both caretakers missing work.  Precautions/Restrictions  Precautions Precautions: Fall  Balance Screening Balance Screen Has the patient fallen in the past 6 months: Yes How many times?:  ("slides down") Has the patient had a decrease in activity level because of a fear of falling? : Yes Is the patient reluctant to leave their home because of a fear of falling? : No  Prior [place] expects to be discharged to:: Private residence Prior Function Level of Independence: Needs assistance with gait;Requires assistive device for independence;Needs assistance with homemaking  Able to Take Stairs?: Yes Driving: No Comments: Enjoys being with his family.   Cognition/Observation Observation/Other Assessments Observations: Significant pitting edema to RLE  Sensation/Coordination/Flexibility/Functional Tests Functional Tests Functional Tests: FOTO: Status: 28%/Limiations: 72%  Assessment RLE Strength RLE Overall Strength Comments: taken in seated position Right  Hip Flexion: 3+/5 Right Hip Extension: 2/5 Right Hip ABduction: 3+/5 Right Hip ADduction: 3+/5 Right Knee Flexion: 3+/5 Right Knee Extension: 3+/5 LLE Strength LLE Overall Strength Comments: taken in seated position Left Hip Flexion: 3+/5 Left Hip Extension: 2/5 Left Hip ABduction: 3+/5 Left Hip ADduction: 3+/5 Left Knee Flexion: 3+/5 Left Knee Extension: 3+/5  Mobility/Balance  Transfers Transfers: Sit to Stand;Stand to Sit Sit  to Stand: 3: Mod assist;With upper extremity assist;From chair/3-in-1 Sit to Stand Details (indicate cue type and reason): cueing for technique Stand to Sit: 3: Mod assist;With upper extremity assist;To chair/3-in-1 Stand to Sit Details: cueing for handplacement Ambulation/Gait Ambulation/Gait: Yes Assistive device: Rolling walker Gait Pattern: Trunk flexed;Shuffle (picking up RW inconsitently, especially with turns) Gait velocity: 0.63 ft/sec Static Standing Balance Static Standing - Level of Assistance: 4: Min assist Single Leg Stance - Right Leg: 0 Single Leg Stance - Left Leg: 0 Tandem Stance - Right Leg: 0 Tandem Stance - Left Leg: 0 Rhomberg - Eyes Opened: 60 Rhomberg - Eyes Closed: 60 Berg Balance Test Sit to Stand: Needs moderate or maximal assist to stand Standing Unsupported: Able to stand 2 minutes with supervision Sitting with Back Unsupported but Feet Supported on Floor or Stool: Able to sit safely and securely 2 minutes Stand to Sit: Needs assistance to sit Transfers: Needs one person to assist Standing Unsupported with Eyes Closed: Able to stand 10 seconds with supervision Standing Ubsupported with Feet Together: Needs help to attain position but able to stand for 30 seconds with feet together From Standing, Reach Forward with Outstretched Arm: Reaches forward but needs supervision From Standing Position, Pick up Object from Floor: Unable to pick up and needs supervision From Standing Position, Turn to Look Behind Over each Shoulder: Needs supervision when turning Turn 360 Degrees: Needs assistance while turning Standing Unsupported, Alternately Place Feet on Step/Stool: Needs assistance to keep from falling or unable to try Standing Unsupported, One Foot in Front: Loses balance while stepping or standing Standing on One Leg: Unable to try or needs assist to prevent fall Total Score: 15 Timed Up and Go Test TUG: Normal TUG Normal TUG (seconds): 65 (w/RW and mod A)      Physical Therapy Assessment and Plan PT Assessment and Plan Clinical Impression Statement: Pt is an 79 year old female referred to PT for BLE weakness with impairments listed below.  At this time pt is being treated by Dr. Delphina Cahill in [place] for UTI and has improvement.  She has a significant hx of balance disorders and cognitive impairments and has been cleared for stroke.  Provided pt with an HEP that her daugthers are able to assit her with at home. Discussed in detail about looking into an assisted living facility due to pt's rapid decline, cognitive impairments and need for mod A for most mobility and patients daugthers work and are unable to quit work to provided supervision.  Pt daugther Meriel Pica) reports they may be able to have a caretaker come in soon.   At this time pt wishes to transfer to [place] location due to distance from her current living situation in [place] with hopes to return to independent living in [place] will call to schedul appointments.  Pt will benefit from skilled therapeutic intervention in order to improve on the following deficits: Abnormal gait;Decreased activity tolerance;Cardiopulmonary status limiting activity;Decreased balance;Decreased strength;Impaired perceived functional ability;Decreased mobility;Difficulty walking;Pain Rehab Potential: Fair Clinical Impairments Affecting Rehab Potential: due to cognitive impairments PT Frequency: Min  3X/week (2-3x/week) PT Duration: 6 weeks PT Treatment/Interventions: DME instruction;Gait training;Stair training;Functional mobility training;Therapeutic activities;Therapeutic exercise;Balance training;Neuromuscular re-education;Patient/family education PT Plan: Continue with general LE strengthening activities (LAQ's, seated hip iso abd and add, standing mini squats, heel/toe raises, hip extension and abduction) Progress towards balance activities as able to reach functional goals. Please  call Geoffery Lyons, MPT, ATC at [phone] or email at [email or web address]  at Outpatient Rehab at [facility] if you have any questions.     Goals Home Exercise Program Pt/caregiver will Perform Home Exercise Program: With Supervision, verbal cues required/provided PT Goal: Perform Home Exercise Program - Progress: Goal set today PT Short Term Goals Time to Complete Short Term Goals: 3 weeks PT Short Term Goal 1: Pt will improve her BLE strength by 1 muscle grade to go from sit <> stand with min A and BUE support.  PT Short Term Goal 2: Pt will improve her activity tolerance and ambulate mod I with RW x5 minutes for greater ease with walking in her home.  PT Short Term Goal 3: Pt will improve her gait speed to 1.0 ft/sec with RW and mod I for greater safety when beginning to ambulate in the community.  PT Long Term Goals Time to Complete Long Term Goals:  (6 weeks) PT Long Term Goal 1: Pt will improve her BLE strength to [facility] in order to go from sit to stand w/mod I in order to progress towards an independent lifestyle at her home in [place].  PT Long Term Goal 2: Pt will improve her balance and improve her berg balance score to 35/56 in order to decrease risk of falls.  Long Term Goal 3: Pt will improve her TUG time to less than 30 seconds with LRAD  mod I for improved safetly when walking in the community.  Long Term Goal 4: Pt will imporve her FOTO to status greater than 54% and limaitions less than 46% for improved percieved functional ability.   Problem List Patient Active Problem List   Diagnosis Date Noted  . Difficulty walking 10/28/2013  . Muscle weakness (generalized) 10/28/2013  . Morbid obesity 10/24/2013  . Aortic stenosis, moderate 04/22/2013  . S/P CABG x 4 04/22/2013  . Gastric mass 08/18/2011  . Diabetes mellitus, type 2   . CHF 03/04/2010  . COLONIC POLYPS, HX OF 03/04/2010  . THYROID MASS 02/25/2010  . DIABETES MELLITUS, TYPE II 02/25/2010  . HYPERLIPIDEMIA  02/25/2010  . UNSPECIFIED ANEMIA 02/25/2010  . ANXIETY 02/25/2010  . HYPERTENSION 02/25/2010  . CORONARY ARTERY DISEASE 02/25/2010  . REFLUX ESOPHAGITIS 02/25/2010  . HIATAL HERNIA 02/25/2010  . OSTEOARTHRITIS 02/25/2010  . GASTRIC ULCER, HX OF 02/25/2010    PT - End of Session Equipment Utilized During Treatment: Gait belt Activity Tolerance: Patient limited by fatigue General Behavior During Therapy: [facility] for tasks assessed/performed PT Plan of Care PT Home Exercise Plan: given PT Patient Instructions: discussed assitsted living, importance of HEP, PT role and POC at [facility] location.  P Consulted and Agree with Plan of Care: Patient;Family member/caregiver Family Member Consulted: daugther Minette Brine)  GP Functional Assessment Tool Used: FOTO: Status: 28%, Limiations: 72% Functional Limitation: Other PT primary Other PT Primary Current Status [id number]): At least 60 percent but less than 80 percent impaired, limited or restricted Other PT Primary Goal Status [id number]): At least 40 percent but less than 60 percent impaired, limited or restricted  Santrice Muzio, MPT, ATC 10/28/2013, 2:17 PM  Physician Documentation Your signature is required to indicate approval of  the treatment plan as stated above.  Please sign and either send electronically or make a copy of this report for your files and return this physician signed original.   Please mark one 1.__approve of plan  2. ___approve of plan with the following conditions.   ______________________________                                                          _____________________ Physician Signature                                                                                                             Date

## 2013-11-04 ENCOUNTER — Ambulatory Visit: Payer: Medicare Other | Attending: Internal Medicine | Admitting: Physical Therapy

## 2013-11-04 DIAGNOSIS — M25559 Pain in unspecified hip: Secondary | ICD-10-CM | POA: Insufficient documentation

## 2013-11-04 DIAGNOSIS — R293 Abnormal posture: Secondary | ICD-10-CM | POA: Diagnosis not present

## 2013-11-04 DIAGNOSIS — IMO0001 Reserved for inherently not codable concepts without codable children: Secondary | ICD-10-CM | POA: Diagnosis not present

## 2013-11-04 DIAGNOSIS — M25659 Stiffness of unspecified hip, not elsewhere classified: Secondary | ICD-10-CM | POA: Diagnosis not present

## 2013-11-07 ENCOUNTER — Ambulatory Visit: Payer: Medicare Other | Admitting: Physical Therapy

## 2013-11-11 ENCOUNTER — Ambulatory Visit: Payer: Medicare Other | Admitting: Physical Therapy

## 2013-11-12 ENCOUNTER — Ambulatory Visit (HOSPITAL_COMMUNITY)
Admission: RE | Admit: 2013-11-12 | Discharge: 2013-11-12 | Disposition: A | Discharge status: Home / Self Care | Payer: Medicare Other | Source: Ambulatory Visit | Attending: Cardiovascular Disease | Admitting: Cardiovascular Disease

## 2013-11-12 DIAGNOSIS — I509 Heart failure, unspecified: Secondary | ICD-10-CM | POA: Insufficient documentation

## 2013-11-12 DIAGNOSIS — I10 Essential (primary) hypertension: Secondary | ICD-10-CM | POA: Insufficient documentation

## 2013-11-12 DIAGNOSIS — E119 Type 2 diabetes mellitus without complications: Secondary | ICD-10-CM | POA: Insufficient documentation

## 2013-11-12 DIAGNOSIS — I350 Nonrheumatic aortic (valve) stenosis: Secondary | ICD-10-CM

## 2013-11-12 DIAGNOSIS — E785 Hyperlipidemia, unspecified: Secondary | ICD-10-CM | POA: Insufficient documentation

## 2013-11-12 DIAGNOSIS — I2510 Atherosclerotic heart disease of native coronary artery without angina pectoris: Secondary | ICD-10-CM | POA: Diagnosis not present

## 2013-11-12 DIAGNOSIS — I359 Nonrheumatic aortic valve disorder, unspecified: Secondary | ICD-10-CM | POA: Insufficient documentation

## 2013-11-12 DIAGNOSIS — Z951 Presence of aortocoronary bypass graft: Secondary | ICD-10-CM

## 2013-11-12 NOTE — Progress Notes (Signed)
2D Echo Performed 11/12/2013    Marygrace Drought, RCS

## 2013-11-13 ENCOUNTER — Ambulatory Visit: Payer: Medicare Other | Admitting: Physical Therapy

## 2013-11-14 ENCOUNTER — Encounter: Payer: Self-pay | Admitting: *Deleted

## 2013-11-15 NOTE — Progress Notes (Signed)
Attempted to call patient at home number - voicemail box is full

## 2013-11-18 DIAGNOSIS — N390 Urinary tract infection, site not specified: Secondary | ICD-10-CM | POA: Diagnosis not present

## 2013-11-18 DIAGNOSIS — R42 Dizziness and giddiness: Secondary | ICD-10-CM | POA: Diagnosis not present

## 2013-11-18 NOTE — Progress Notes (Signed)
LMTCB

## 2013-11-19 ENCOUNTER — Ambulatory Visit: Payer: Medicare Other | Admitting: Physical Therapy

## 2013-11-21 ENCOUNTER — Ambulatory Visit: Payer: Medicare Other | Admitting: Physical Therapy

## 2013-11-25 NOTE — Progress Notes (Signed)
LMTCB

## 2013-11-26 ENCOUNTER — Ambulatory Visit: Payer: Medicare Other | Admitting: Physical Therapy

## 2013-11-26 DIAGNOSIS — M533 Sacrococcygeal disorders, not elsewhere classified: Secondary | ICD-10-CM | POA: Diagnosis not present

## 2013-11-26 DIAGNOSIS — M47817 Spondylosis without myelopathy or radiculopathy, lumbosacral region: Secondary | ICD-10-CM | POA: Diagnosis not present

## 2013-11-27 ENCOUNTER — Encounter: Payer: Self-pay | Admitting: *Deleted

## 2013-12-03 ENCOUNTER — Ambulatory Visit: Payer: Medicare Other | Admitting: Physical Therapy

## 2013-12-04 ENCOUNTER — Ambulatory Visit (INDEPENDENT_AMBULATORY_CARE_PROVIDER_SITE_OTHER): Payer: Medicare Other | Admitting: Internal Medicine

## 2013-12-04 ENCOUNTER — Encounter: Payer: Self-pay | Admitting: Internal Medicine

## 2013-12-04 VITALS — BP 160/80 | HR 73 | Ht 62.0 in | Wt 219.4 lb

## 2013-12-04 DIAGNOSIS — Z951 Presence of aortocoronary bypass graft: Secondary | ICD-10-CM

## 2013-12-04 DIAGNOSIS — R079 Chest pain, unspecified: Secondary | ICD-10-CM | POA: Diagnosis not present

## 2013-12-04 DIAGNOSIS — I2510 Atherosclerotic heart disease of native coronary artery without angina pectoris: Secondary | ICD-10-CM

## 2013-12-04 DIAGNOSIS — I359 Nonrheumatic aortic valve disorder, unspecified: Secondary | ICD-10-CM

## 2013-12-04 DIAGNOSIS — I10 Essential (primary) hypertension: Secondary | ICD-10-CM

## 2013-12-04 DIAGNOSIS — K3189 Other diseases of stomach and duodenum: Secondary | ICD-10-CM

## 2013-12-04 DIAGNOSIS — K319 Disease of stomach and duodenum, unspecified: Secondary | ICD-10-CM | POA: Diagnosis not present

## 2013-12-04 DIAGNOSIS — I350 Nonrheumatic aortic (valve) stenosis: Secondary | ICD-10-CM

## 2013-12-04 DIAGNOSIS — E785 Hyperlipidemia, unspecified: Secondary | ICD-10-CM

## 2013-12-04 NOTE — Progress Notes (Signed)
OFFICE NOTE  Chief Complaint:  Routine follow-up, recent UTI, confusion  Primary Care Physician: Delphina Cahill, MD  HPI:  Katherine [name] is an 79 year old female with a history of coronary disease status post CABG in 2003, morbid obesity, hypertension, dyslipidemia, and diabetes type 2. She had valvular aortic stenosis with a sign of increased gradient on her last echo in November. We repeated that echo on October 22, 2012 which showed actually no significant change in her aortic valve gradient with a valve area of about 1.2 cm, and peak and mean gradients of 45 and 24 mmHg. This is similar to findings in 2012. I suspect that we may have had an incomplete envelop and that her true gradients are closer to 50 and 30 mmHg. However, this would still yield moderate to severe aortic stenosis. Overall though she is asymptomatic at this point and there is no clear recommendation for surgery or procedures at this time. He reports that recently she had a urinary tract infection which is associated with some confusion. Ultimately this is treated and she rebounded quite quickly. She denies any chest pain or worsening shortness of breath. She has had no syncopal episodes. She is stated in the past but she is not interested in a repeat open heart surgical procedure, nor do I think she is a good candidate for that.  She returns today for followup with a repeat echo. This shows fairly stable and gradients and again moderate to severe aortic stenosis with a valve area of about 1-1.1 cm. She has reported a slight increase in lower extremity swelling but also for the past 2 weeks has awakened in the morning with chest pain. The chest discomfort is located over the left anterior chest wall and is somewhat worse with movement and position. It is not necessarily associated with exertion, however she no longer has to walk up stairs as she moved her washing machine and dryer up to the first floor. Her last stress  test was in 2011 and was nonischemic.  PMHx:  Past Medical History  Diagnosis Date  . Gastric mass   . Hernia of unspecified site of abdominal cavity without mention of obstruction or gangrene     hiatal  . Gastric ulcer   . Thyroid mass   . GERD (gastroesophageal reflux disease)   . Anxiety   . Coronary artery disease   . Osteoarthritis   . Anemia   . Hypertension   . Hyperlipidemia   . Diabetes mellitus, type 2   . Aortic valvular stenosis     moderate to severe  EF on 2D Echo 10/22/2012 65-70%  valve area of 1.2cm, and peak and mean gradients of 45 and 72mmHg    Past Surgical History  Procedure Laterality Date  . Coronary artery bypass graft      x4 in 2003  . Tubal ligation    . Knee surgery    . Uterine polypectomy    . Internal hemorrhoidectomy      FAMHx:  Family History  Problem Relation Age of Onset  . Colon cancer Neg Hx   . Diabetes Mother   . Diabetes Father   . Heart disease Mother   . Stroke Father   . Stroke Mother   . Heart attack Brother     SOCHx:   reports that she has never smoked. She has never used smokeless tobacco. She reports that she does not drink alcohol or use illicit drugs.  ALLERGIES:  No  Known Allergies  ROS: A comprehensive review of systems was negative except for: Constitutional: positive for weight gain Respiratory: positive for dyspnea on exertion Cardiovascular: positive for chest pain and lower extremity edema Genitourinary: positive for recent UTI  HOME MEDS: Current Outpatient Prescriptions  Medication Sig Dispense Refill  . acetaminophen (TYLENOL) 500 MG tablet Take 500 mg by mouth. Take one by mouth in morning and one at bedtime       . albuterol-ipratropium (COMBIVENT) 18-103 MCG/ACT inhaler Inhale 2 puffs into the lungs every 6 (six) hours as needed. Shortness of breath      . ALPRAZolam (XANAX) 0.25 MG tablet Take 0.5-1 tablets by mouth 2 (two) times daily as needed.      Marland Kitchen BYSTOLIC 5 MG tablet Take 10 mg by  mouth daily.       . Calcium Carb-Cholecalciferol (CALTRATE 600+D) 600-800 MG-UNIT TABS Take 1 tablet by mouth 2 (two) times daily.      Marland Kitchen docusate sodium (COLACE) 100 MG capsule Take 100 mg by mouth daily as needed for mild constipation.      Marland Kitchen esomeprazole (NEXIUM) 20 MG capsule Take 20 mg by mouth daily at 12 noon.      . fluocinonide cream (LIDEX) [id number] % Apply 1 application topically 2 (two) times daily.      Marland Kitchen HYDROcodone-acetaminophen (NORCO) 5-325 MG per tablet Take 1 tablet by mouth every 8 (eight) hours as needed. pain      . insulin detemir (LEVEMIR FLEXPEN) 100 UNIT/ML injection Inject 40 Units into the skin 2 (two) times daily.       . meloxicam (MOBIC) 7.5 MG tablet Take 1 tablet by mouth daily.      . metFORMIN (GLUCOPHAGE) 500 MG tablet Take 500 mg by mouth 2 (two) times daily with a meal.       . Multiple Vitamins-Minerals (MULTIVITAL) CHEW Chew 1 each by mouth daily.      . Omega-3 Fatty Acids (FISH OIL) 1000 MG CAPS Take 1 capsule by mouth daily.      . potassium chloride SA (K-DUR,KLOR-CON) 20 MEQ tablet Take 20 mEq by mouth daily.      . rosuvastatin (CRESTOR) 20 MG tablet Take 20 mg by mouth daily.        Marland Kitchen triamterene-hydrochlorothiazide (DYAZIDE) 37.5-25 MG per capsule Take 1 capsule by mouth every morning.        Marland Kitchen VOLTAREN 1 % GEL Apply 1 application topically once.       Current Facility-Administered Medications  Medication Dose Route Frequency Provider Last Rate Last Dose  . 0.9 %  sodium chloride infusion  500 mL Intravenous Continuous Lafayette Dragon, MD        LABS/IMAGING: No results found for this or any previous visit (from the past 48 hour(s)). No results found.  VITALS: BP 160/80  Pulse 73  Ht 5\' 2"  (1.575 m)  Wt 219 lb 6.4 oz (99.519 kg)  BMI 40.12 kg/m2  EXAM: General appearance: alert and no distress Neck: no carotid bruit and no JVD Lungs: clear to auscultation bilaterally Heart: regular rate and rhythm, S1, soft S2, late-peaking 3/6  Musical  SEM at the RUSB, radiates to neck base Abdomen: soft, non-tender; bowel sounds normal; no masses,  no organomegaly and morbidly obese Extremities: edema trace bilateral Pulses: 2+ and symmetric Skin: Skin color, texture, turgor normal. No rashes or lesions Neurologic: Grossly normal Psych: Normal mood, affect  EKG: Normal sinus rhythm at 73, PAC's  ASSESSMENT: 1. Chest pain  2. Moderate to more likely severe aortic stenosis - Mean gradient about 30 mmHg 3. Morbid obesity 4. History of coronary artery disease status post CABG in 2003 5. Hypertension - repeat BP was 162/80 6. Dyslipidemia 7. Diabetes type 2  PLAN: 1.   Ms. Gaba has moderate to severe aortic stenosis, but I do not think this is yet contributing to her symptoms. She is describing chest pain which she's been having almost every morning she wakes up. This could possibly be due to hypertension, arthritis, or potentially ischemia given her fairly old bypass grafts. Her last stress test was in 2011 and I would like to repeat a nuclear stress test to evaluate for ischemia. If there is an abnormality, we need to consider catheterization and our approach should also include potential procedures to deal with her aortic stenosis. As previously mentioned, she may be a better TAVR candidate.  Plan to see her back in a few weeks to discuss results of her stress test.  [name], MD, [facility] Attending Cardiologist [facility]  HILTY,Kenneth C 12/04/2013, 12:20 PM

## 2013-12-04 NOTE — Patient Instructions (Signed)
Your physician has requested that you have a lexiscan myoview. For further information please visit [email or web address]. Please follow instruction sheet, as given.  Please schedule a follow up visit after your test to review the results with Dr. Debara Pickett - 2-3 weeks

## 2013-12-05 DIAGNOSIS — IMO0001 Reserved for inherently not codable concepts without codable children: Secondary | ICD-10-CM

## 2013-12-05 HISTORY — DX: Reserved for inherently not codable concepts without codable children: IMO0001

## 2013-12-06 ENCOUNTER — Encounter: Payer: Self-pay | Admitting: Internal Medicine

## 2013-12-10 ENCOUNTER — Ambulatory Visit: Payer: Medicare Other | Attending: Internal Medicine | Admitting: Physical Therapy

## 2013-12-10 DIAGNOSIS — M25659 Stiffness of unspecified hip, not elsewhere classified: Secondary | ICD-10-CM | POA: Insufficient documentation

## 2013-12-10 DIAGNOSIS — R293 Abnormal posture: Secondary | ICD-10-CM | POA: Insufficient documentation

## 2013-12-10 DIAGNOSIS — IMO0001 Reserved for inherently not codable concepts without codable children: Secondary | ICD-10-CM | POA: Insufficient documentation

## 2013-12-10 DIAGNOSIS — M25559 Pain in unspecified hip: Secondary | ICD-10-CM | POA: Diagnosis not present

## 2013-12-11 ENCOUNTER — Encounter (HOSPITAL_COMMUNITY): Payer: Medicare Other

## 2013-12-12 ENCOUNTER — Ambulatory Visit: Payer: Medicare Other | Admitting: Physical Therapy

## 2013-12-13 ENCOUNTER — Ambulatory Visit: Payer: Medicare Other | Admitting: Physical Therapy

## 2013-12-16 ENCOUNTER — Telehealth (HOSPITAL_COMMUNITY): Payer: Self-pay | Admitting: *Deleted

## 2013-12-17 ENCOUNTER — Ambulatory Visit: Payer: Medicare Other | Admitting: Physical Therapy

## 2013-12-19 ENCOUNTER — Ambulatory Visit: Payer: Medicare Other | Admitting: Physical Therapy

## 2013-12-20 ENCOUNTER — Ambulatory Visit: Payer: Medicare Other | Admitting: Internal Medicine

## 2013-12-20 ENCOUNTER — Encounter (HOSPITAL_COMMUNITY): Payer: Self-pay | Admitting: *Deleted

## 2013-12-20 ENCOUNTER — Telehealth (HOSPITAL_COMMUNITY): Payer: Self-pay | Admitting: *Deleted

## 2013-12-23 DIAGNOSIS — N390 Urinary tract infection, site not specified: Secondary | ICD-10-CM | POA: Diagnosis not present

## 2013-12-23 DIAGNOSIS — R300 Dysuria: Secondary | ICD-10-CM | POA: Diagnosis not present

## 2013-12-23 DIAGNOSIS — G3184 Mild cognitive impairment, so stated: Secondary | ICD-10-CM | POA: Diagnosis not present

## 2013-12-24 ENCOUNTER — Ambulatory Visit: Payer: Medicare Other | Admitting: Physical Therapy

## 2013-12-26 ENCOUNTER — Ambulatory Visit: Payer: Medicare Other | Admitting: Physical Therapy

## 2013-12-27 ENCOUNTER — Ambulatory Visit (HOSPITAL_COMMUNITY)
Admission: RE | Admit: 2013-12-27 | Discharge: 2013-12-27 | Disposition: A | Discharge status: Home / Self Care | Payer: Medicare Other | Source: Ambulatory Visit | Attending: Internal Medicine | Admitting: Internal Medicine

## 2013-12-27 DIAGNOSIS — R079 Chest pain, unspecified: Secondary | ICD-10-CM | POA: Diagnosis not present

## 2013-12-27 DIAGNOSIS — I2510 Atherosclerotic heart disease of native coronary artery without angina pectoris: Secondary | ICD-10-CM

## 2013-12-27 MED ORDER — REGADENOSON 0.4 MG/5ML IV SOLN
0.4000 mg | Freq: Once | INTRAVENOUS | Status: AC
  Administered 2013-12-27: 0.4 mg via INTRAVENOUS

## 2013-12-27 MED ORDER — TECHNETIUM TC 99M SESTAMIBI GENERIC - CARDIOLITE
10.0000 | Freq: Once | INTRAVENOUS | Status: AC | PRN
  Administered 2013-12-27: 10 via INTRAVENOUS

## 2013-12-27 MED ORDER — TECHNETIUM TC 99M SESTAMIBI GENERIC - CARDIOLITE
30.0000 | Freq: Once | INTRAVENOUS | Status: AC | PRN
  Administered 2013-12-27: 30 via INTRAVENOUS

## 2013-12-27 NOTE — Procedures (Addendum)
[place] [address] [place] [address] [phone]  Cardiology Nuclear Med Study  Katherine [name] is a 80 y.o. female     MRN : [id number]     DOB: 22-Feb-1928  Procedure Date: 12/27/2013  Nuclear Med Background Indication for Stress Test:  Evaluation for Ischemia and Graft Patency History:  CHF Cardiac Risk Factors: Family History - CAD, Hypertension, IDDM Type 2, Lipids and Obesity  Symptoms:  Chest Pain, Dizziness, Fatigue, Light-Headedness, Palpitations and SOB   Nuclear Pre-Procedure Caffeine/Decaff Intake:  7:00pm NPO After: 5:00am   IV Site: R Antecubital  IV 0.9% NS with Angio Cath:  22g  Chest Size (in):  n/a IV Started by: Otho Perl, CNMT  Height: 5\' 2"  (1.575 m)  Cup Size: 46D  BMI:  Body mass index is 40.05 kg/(m^2). Weight:  219 lb (99.338 kg)   Tech Comments:  n/a    Nuclear Med Study 1 or 2 day study: 1 day  Stress Test Type:  [place]  Order Authorizing Provider:  Lyman Bishop, MD   Resting Radionuclide: Technetium 69m Sestamibi  Resting Radionuclide Dose: 10.8 mCi   Stress Radionuclide:  Technetium 108m Sestamibi  Stress Radionuclide Dose: 30.1 mCi           Stress Protocol Rest HR: 67 Stress HR: 84  Rest BP: 146/105 Stress BP: 176/95  Exercise Time (min): n/a METS: n/a   Predicted Max HR: 137 bpm % Max HR: 66.42 bpm Rate Pressure Product: 16471  Dose of Adenosine (mg):  n/a Dose of Lexiscan: 0.4 mg  Dose of Atropine (mg): n/a Dose of Dobutamine: n/a mcg/kg/min (at max HR)  Stress Test Technologist: Leane Para, CCT Nuclear Technologist: Otho Perl, CNMT   Rest Procedure:  Myocardial perfusion imaging was performed at rest 45 minutes following the intravenous administration of Technetium 75m Sestamibi. Stress Procedure:  The patient received IV Lexiscan 0.4 mg over 15-seconds.  Technetium 57m Sestamibi injected at 30-seconds.  There were no significant changes with  Lexiscan.  Quantitative spect images were obtained after a 45 minute delay.  Transient Ischemic Dilatation (Normal <1.22):  0.78 Lung/Heart Ratio (Normal <0.45):  0.26 QGS EDV:  n/a ml QGS ESV:  n/a ml LV Ejection Fraction: not gated  Rest ECG: NSR - Normal EKG  Stress ECG: No significant change from baseline ECG  QPS Raw Data Images:  Normal; no motion artifact; normal heart/lung ratio. Stress Images:  Normal homogeneous uptake in all areas of the myocardium. Rest Images:  Normal homogeneous uptake in all areas of the myocardium. Subtraction (SDS):  Normal  Impression Exercise Capacity:  Lexiscan with no exercise. BP Response:  Normal blood pressure response. Clinical Symptoms:  No significant symptoms noted. ECG Impression:  No significant ECG changes with Lexiscan. Comparison with Prior Nuclear Study: No significant change from previous study  Overall Impression:  Normal stress nuclear study.  LV Wall Motion:  Non-gated due to ectopy.  [name], MD, [facility] Board Certified in Nuclear Cardiology Attending Cardiologist [place], MD  12/27/2013 12:27 PM

## 2013-12-31 ENCOUNTER — Ambulatory Visit: Payer: Medicare Other | Admitting: Physical Therapy

## 2014-01-02 ENCOUNTER — Ambulatory Visit: Payer: Medicare Other | Admitting: Physical Therapy

## 2014-01-07 ENCOUNTER — Ambulatory Visit: Payer: Medicare Other | Attending: Internal Medicine | Admitting: Physical Therapy

## 2014-01-07 DIAGNOSIS — M25559 Pain in unspecified hip: Secondary | ICD-10-CM | POA: Insufficient documentation

## 2014-01-07 DIAGNOSIS — M25659 Stiffness of unspecified hip, not elsewhere classified: Secondary | ICD-10-CM | POA: Diagnosis not present

## 2014-01-07 DIAGNOSIS — R293 Abnormal posture: Secondary | ICD-10-CM | POA: Insufficient documentation

## 2014-01-07 DIAGNOSIS — IMO0001 Reserved for inherently not codable concepts without codable children: Secondary | ICD-10-CM | POA: Insufficient documentation

## 2014-01-08 DIAGNOSIS — E119 Type 2 diabetes mellitus without complications: Secondary | ICD-10-CM | POA: Diagnosis not present

## 2014-01-08 DIAGNOSIS — K5900 Constipation, unspecified: Secondary | ICD-10-CM | POA: Diagnosis not present

## 2014-01-08 DIAGNOSIS — R109 Unspecified abdominal pain: Secondary | ICD-10-CM | POA: Diagnosis not present

## 2014-01-08 DIAGNOSIS — I10 Essential (primary) hypertension: Secondary | ICD-10-CM | POA: Diagnosis not present

## 2014-01-08 DIAGNOSIS — E785 Hyperlipidemia, unspecified: Secondary | ICD-10-CM | POA: Diagnosis not present

## 2014-01-08 DIAGNOSIS — E782 Mixed hyperlipidemia: Secondary | ICD-10-CM | POA: Diagnosis not present

## 2014-01-09 ENCOUNTER — Ambulatory Visit: Payer: Medicare Other | Admitting: Physical Therapy

## 2014-01-10 ENCOUNTER — Ambulatory Visit: Payer: Medicare Other | Admitting: Internal Medicine

## 2014-01-13 ENCOUNTER — Encounter: Payer: Self-pay | Admitting: Internal Medicine

## 2014-01-13 ENCOUNTER — Ambulatory Visit (INDEPENDENT_AMBULATORY_CARE_PROVIDER_SITE_OTHER): Payer: Medicare Other | Admitting: Internal Medicine

## 2014-01-13 VITALS — BP 141/76 | HR 83 | Ht 62.0 in | Wt 215.8 lb

## 2014-01-13 DIAGNOSIS — I2510 Atherosclerotic heart disease of native coronary artery without angina pectoris: Secondary | ICD-10-CM

## 2014-01-13 DIAGNOSIS — R079 Chest pain, unspecified: Secondary | ICD-10-CM | POA: Diagnosis not present

## 2014-01-13 DIAGNOSIS — Z951 Presence of aortocoronary bypass graft: Secondary | ICD-10-CM | POA: Diagnosis not present

## 2014-01-13 DIAGNOSIS — E119 Type 2 diabetes mellitus without complications: Secondary | ICD-10-CM | POA: Diagnosis not present

## 2014-01-13 NOTE — Progress Notes (Signed)
OFFICE NOTE  Chief Complaint:  Routine follow-up, recent UTI, confusion  Primary Care Physician: Delphina Cahill, MD  HPI:  Katherine [name] is an 80 year old female with a history of coronary disease status post CABG in 2003, morbid obesity, hypertension, dyslipidemia, and diabetes type 2. She had valvular aortic stenosis with a sign of increased gradient on her last echo in November. We repeated that echo on October 22, 2012 which showed actually no significant change in her aortic valve gradient with a valve area of about 1.2 cm, and peak and mean gradients of 45 and 24 mmHg. This is similar to findings in 2012. I suspect that we may have had an incomplete envelop and that her true gradients are closer to 50 and 30 mmHg. However, this would still yield moderate to severe aortic stenosis. Overall though she is asymptomatic at this point and there is no clear recommendation for surgery or procedures at this time. He reports that recently she had a urinary tract infection which is associated with some confusion. Ultimately this is treated and she rebounded quite quickly. She denies any chest pain or worsening shortness of breath. She has had no syncopal episodes. She is stated in the past but she is not interested in a repeat open heart surgical procedure, nor do I think she is a good candidate for that.  She returns today for followup with a repeat echo. This shows fairly stable and gradients and again moderate to severe aortic stenosis with a valve area of about 1-1.1 cm. She has reported a slight increase in lower extremity swelling but also for the past 2 weeks has awakened in the morning with chest pain. The chest discomfort is located over the left anterior chest wall and is somewhat worse with movement and position. It is not necessarily associated with exertion, however she no longer has to walk up stairs as she moved her washing machine and dryer up to the first floor. Her last stress  test was in 2011 and was nonischemic.  Katherine [name] returns today for followup of her stress test. This was nonischemic, but not gated secondary to ectopy.  She reports her morning chest pain has resolved in fact is improved sometimes with ginger ale, suggesting it is of GI etiology.  PMHx:  Past Medical History  Diagnosis Date  . Gastric mass   . Hernia of unspecified site of abdominal cavity without mention of obstruction or gangrene     hiatal  . Gastric ulcer   . Thyroid mass   . GERD (gastroesophageal reflux disease)   . Anxiety   . Coronary artery disease   . Osteoarthritis   . Anemia   . Hypertension   . Hyperlipidemia   . Diabetes mellitus, type 2   . Aortic valvular stenosis     moderate to severe  EF on 2D Echo 10/22/2012 65-70%  valve area of 1.2cm, and peak and mean gradients of 45 and 10mmHg    Past Surgical History  Procedure Laterality Date  . Coronary artery bypass graft      x4 in 2003  . Tubal ligation    . Knee surgery    . Uterine polypectomy    . Internal hemorrhoidectomy      FAMHx:  Family History  Problem Relation Age of Onset  . Colon cancer Neg Hx   . Diabetes Mother   . Diabetes Father   . Heart disease Mother   . Stroke Father   .  Stroke Mother   . Heart attack Brother     SOCHx:   reports that she has never smoked. She has never used smokeless tobacco. She reports that she does not drink alcohol or use illicit drugs.  ALLERGIES:  No Known Allergies  ROS: A comprehensive review of systems was negative.  HOME MEDS: Current Outpatient Prescriptions  Medication Sig Dispense Refill  . acetaminophen (TYLENOL) 500 MG tablet Take 500 mg by mouth. Take one by mouth in morning and one at bedtime       . albuterol-ipratropium (COMBIVENT) 18-103 MCG/ACT inhaler Inhale 2 puffs into the lungs every 6 (six) hours as needed. Shortness of breath      . ALPRAZolam (XANAX) 0.25 MG tablet Take 0.5-1 tablets by mouth 2 (two) times daily as needed.       Marland Kitchen BYSTOLIC 5 MG tablet Take 10 mg by mouth daily.       . Calcium Carb-Cholecalciferol (CALTRATE 600+D) 600-800 MG-UNIT TABS Take 1 tablet by mouth 2 (two) times daily.      Marland Kitchen docusate sodium (COLACE) 100 MG capsule Take 100 mg by mouth daily as needed for mild constipation.      Marland Kitchen esomeprazole (NEXIUM) 20 MG capsule Take 20 mg by mouth daily at 12 noon.      . fluocinonide cream (LIDEX) [id number] % Apply 1 application topically 2 (two) times daily.      Marland Kitchen HYDROcodone-acetaminophen (NORCO) 5-325 MG per tablet Take 1 tablet by mouth every 8 (eight) hours as needed. pain      . insulin detemir (LEVEMIR FLEXPEN) 100 UNIT/ML injection Inject 40 Units into the skin 2 (two) times daily.       . meloxicam (MOBIC) 7.5 MG tablet Take 1 tablet by mouth daily.      . memantine (NAMENDA TITRATION PACK) tablet pack Take by mouth See admin instructions. 5 mg/day for =1 week; 5 mg twice daily for =1 week; 15 mg/day given in 5 mg and 10 mg separated doses for =1 week; then 10 mg twice daily      . metFORMIN (GLUCOPHAGE) 500 MG tablet Take 500 mg by mouth 2 (two) times daily with a meal.       . Multiple Vitamins-Minerals (MULTIVITAL) CHEW Chew 1 each by mouth daily.      . Omega-3 Fatty Acids (FISH OIL) 1000 MG CAPS Take 1 capsule by mouth daily.      . potassium chloride SA (K-DUR,KLOR-CON) 20 MEQ tablet Take 20 mEq by mouth daily.      . rosuvastatin (CRESTOR) 20 MG tablet Take 20 mg by mouth daily.        Marland Kitchen triamterene-hydrochlorothiazide (DYAZIDE) 37.5-25 MG per capsule Take 1 capsule by mouth every morning.        Marland Kitchen VOLTAREN 1 % GEL Apply 1 application topically once.       Current Facility-Administered Medications  Medication Dose Route Frequency Provider Last Rate Last Dose  . 0.9 %  sodium chloride infusion  500 mL Intravenous Continuous Lafayette Dragon, MD        LABS/IMAGING: No results found for this or any previous visit (from the past 48 hour(s)). No results found.  VITALS: BP 141/76  Pulse 83   Ht 5\' 2"  (1.575 m)  Wt 215 lb 12.8 oz (97.886 kg)  BMI 39.46 kg/m2  EXAM: deferred  EKG: deferred  ASSESSMENT: 1. Chest pain - resolved, low risk NST 01/2014 2. Moderate to more likely severe aortic stenosis - Mean  gradient about 30 mmHg 3. Morbid obesity 4. History of coronary artery disease status post CABG in 2003 5. Hypertension - repeat BP was 162/80 6. Dyslipidemia 7. Diabetes type 2  PLAN: 1.   Ms. Lean had a low risk nuclear stress test. Her chest pain is resolved and improved with drinking ginger ale in the morning which I suspect suggest that she has reflux. She still has at least moderate aortic stenosis and this will be to be followed closely. We'll plan to see her back in 6 months or sooner as necessary.  [name], MD, [facility] Attending Cardiologist [facility]  Mandalyn Pasqua C 01/13/2014, 10:55 AM

## 2014-01-13 NOTE — Patient Instructions (Signed)
Your physician wants you to follow-up in: 6 months with Dr. Hilty. You will receive a reminder letter in the mail two months in advance. If you don't receive a letter, please call our office to schedule the follow-up appointment.    

## 2014-01-14 ENCOUNTER — Ambulatory Visit: Payer: Medicare Other | Admitting: Physical Therapy

## 2014-01-16 ENCOUNTER — Ambulatory Visit: Payer: Medicare Other | Admitting: Physical Therapy

## 2014-01-21 ENCOUNTER — Ambulatory Visit: Payer: Medicare Other | Admitting: Physical Therapy

## 2014-01-23 ENCOUNTER — Ambulatory Visit: Payer: Medicare Other | Admitting: Physical Therapy

## 2014-01-28 ENCOUNTER — Ambulatory Visit: Payer: Medicare Other | Admitting: Physical Therapy

## 2014-01-30 ENCOUNTER — Ambulatory Visit: Payer: Medicare Other | Admitting: Physical Therapy

## 2014-02-04 ENCOUNTER — Ambulatory Visit: Payer: Medicare Other | Attending: Internal Medicine | Admitting: Physical Therapy

## 2014-02-04 DIAGNOSIS — M25659 Stiffness of unspecified hip, not elsewhere classified: Secondary | ICD-10-CM | POA: Insufficient documentation

## 2014-02-04 DIAGNOSIS — IMO0001 Reserved for inherently not codable concepts without codable children: Secondary | ICD-10-CM | POA: Insufficient documentation

## 2014-02-04 DIAGNOSIS — R293 Abnormal posture: Secondary | ICD-10-CM | POA: Insufficient documentation

## 2014-02-04 DIAGNOSIS — M25559 Pain in unspecified hip: Secondary | ICD-10-CM | POA: Insufficient documentation

## 2014-02-05 DIAGNOSIS — F068 Other specified mental disorders due to known physiological condition: Secondary | ICD-10-CM | POA: Diagnosis not present

## 2014-02-05 DIAGNOSIS — IMO0001 Reserved for inherently not codable concepts without codable children: Secondary | ICD-10-CM | POA: Diagnosis not present

## 2014-02-06 ENCOUNTER — Ambulatory Visit: Payer: Medicare Other | Admitting: Physical Therapy

## 2014-02-06 DIAGNOSIS — M25659 Stiffness of unspecified hip, not elsewhere classified: Secondary | ICD-10-CM | POA: Diagnosis not present

## 2014-02-06 DIAGNOSIS — M25559 Pain in unspecified hip: Secondary | ICD-10-CM | POA: Diagnosis not present

## 2014-02-06 DIAGNOSIS — R293 Abnormal posture: Secondary | ICD-10-CM | POA: Diagnosis not present

## 2014-02-06 DIAGNOSIS — IMO0001 Reserved for inherently not codable concepts without codable children: Secondary | ICD-10-CM | POA: Diagnosis not present

## 2014-02-11 ENCOUNTER — Ambulatory Visit: Payer: Medicare Other | Admitting: Physical Therapy

## 2014-02-11 DIAGNOSIS — M25559 Pain in unspecified hip: Secondary | ICD-10-CM | POA: Diagnosis not present

## 2014-02-11 DIAGNOSIS — IMO0001 Reserved for inherently not codable concepts without codable children: Secondary | ICD-10-CM | POA: Diagnosis not present

## 2014-02-11 DIAGNOSIS — M25659 Stiffness of unspecified hip, not elsewhere classified: Secondary | ICD-10-CM | POA: Diagnosis not present

## 2014-02-11 DIAGNOSIS — R293 Abnormal posture: Secondary | ICD-10-CM | POA: Diagnosis not present

## 2014-02-13 ENCOUNTER — Ambulatory Visit: Payer: Medicare Other | Admitting: Physical Therapy

## 2014-02-13 DIAGNOSIS — M25559 Pain in unspecified hip: Secondary | ICD-10-CM | POA: Diagnosis not present

## 2014-02-13 DIAGNOSIS — IMO0001 Reserved for inherently not codable concepts without codable children: Secondary | ICD-10-CM | POA: Diagnosis not present

## 2014-02-13 DIAGNOSIS — M25659 Stiffness of unspecified hip, not elsewhere classified: Secondary | ICD-10-CM | POA: Diagnosis not present

## 2014-02-13 DIAGNOSIS — R293 Abnormal posture: Secondary | ICD-10-CM | POA: Diagnosis not present

## 2014-02-15 ENCOUNTER — Encounter (HOSPITAL_COMMUNITY): Payer: Self-pay | Admitting: Emergency Medicine

## 2014-02-15 ENCOUNTER — Emergency Department (HOSPITAL_COMMUNITY)
Admission: EM | Admit: 2014-02-15 | Discharge: 2014-02-15 | Disposition: A | Discharge status: Home / Self Care | Payer: Medicare Other | Attending: Emergency Medicine | Admitting: Emergency Medicine

## 2014-02-15 DIAGNOSIS — Z951 Presence of aortocoronary bypass graft: Secondary | ICD-10-CM | POA: Insufficient documentation

## 2014-02-15 DIAGNOSIS — R739 Hyperglycemia, unspecified: Secondary | ICD-10-CM

## 2014-02-15 DIAGNOSIS — M1990 Unspecified osteoarthritis, unspecified site: Secondary | ICD-10-CM | POA: Insufficient documentation

## 2014-02-15 DIAGNOSIS — E785 Hyperlipidemia, unspecified: Secondary | ICD-10-CM | POA: Diagnosis not present

## 2014-02-15 DIAGNOSIS — E119 Type 2 diabetes mellitus without complications: Secondary | ICD-10-CM | POA: Insufficient documentation

## 2014-02-15 DIAGNOSIS — Z8711 Personal history of peptic ulcer disease: Secondary | ICD-10-CM | POA: Diagnosis not present

## 2014-02-15 DIAGNOSIS — I10 Essential (primary) hypertension: Secondary | ICD-10-CM | POA: Diagnosis not present

## 2014-02-15 DIAGNOSIS — Z794 Long term (current) use of insulin: Secondary | ICD-10-CM | POA: Insufficient documentation

## 2014-02-15 DIAGNOSIS — Z791 Long term (current) use of non-steroidal anti-inflammatories (NSAID): Secondary | ICD-10-CM | POA: Insufficient documentation

## 2014-02-15 DIAGNOSIS — Z862 Personal history of diseases of the blood and blood-forming organs and certain disorders involving the immune mechanism: Secondary | ICD-10-CM | POA: Insufficient documentation

## 2014-02-15 DIAGNOSIS — R358 Other polyuria: Secondary | ICD-10-CM | POA: Diagnosis not present

## 2014-02-15 DIAGNOSIS — K219 Gastro-esophageal reflux disease without esophagitis: Secondary | ICD-10-CM | POA: Diagnosis not present

## 2014-02-15 DIAGNOSIS — I2510 Atherosclerotic heart disease of native coronary artery without angina pectoris: Secondary | ICD-10-CM | POA: Diagnosis not present

## 2014-02-15 DIAGNOSIS — F411 Generalized anxiety disorder: Secondary | ICD-10-CM | POA: Diagnosis not present

## 2014-02-15 DIAGNOSIS — R3589 Other polyuria: Secondary | ICD-10-CM | POA: Diagnosis not present

## 2014-02-15 DIAGNOSIS — Z79899 Other long term (current) drug therapy: Secondary | ICD-10-CM | POA: Insufficient documentation

## 2014-02-15 LAB — COMPREHENSIVE METABOLIC PANEL
ALK PHOS: 73 U/L (ref 39–117)
ALT: 18 U/L (ref 0–35)
AST: 22 U/L (ref 0–37)
Albumin: 3.4 g/dL — ABNORMAL LOW (ref 3.5–5.2)
BUN: 21 mg/dL (ref 6–23)
CO2: 27 meq/L (ref 19–32)
Calcium: 10.5 mg/dL (ref 8.4–10.5)
Chloride: 94 mEq/L — ABNORMAL LOW (ref 96–112)
Creatinine, Ser: 0.76 mg/dL (ref 0.50–1.10)
GFR calc non Af Amer: 75 mL/min — ABNORMAL LOW (ref >90)
GFR, EST AFRICAN AMERICAN: 87 mL/min — AB (ref >90)
GLUCOSE: 231 mg/dL — AB (ref 70–99)
Potassium: 3.8 mEq/L (ref 3.7–5.3)
SODIUM: 135 meq/L — AB (ref 137–147)
Total Bilirubin: <0.2 mg/dL — ABNORMAL LOW (ref 0.3–1.2)
Total Protein: 6.9 g/dL (ref 6.0–8.3)

## 2014-02-15 LAB — CBC WITH DIFFERENTIAL/PLATELET
Basophils Absolute: 0 10*3/uL (ref 0.0–0.1)
Basophils Relative: 0 % (ref 0–1)
Eosinophils Absolute: 0.1 10*3/uL (ref 0.0–0.7)
Eosinophils Relative: 1 % (ref 0–5)
HCT: 37.5 % (ref 36.0–46.0)
Hemoglobin: 14.1 g/dL (ref 12.0–15.0)
Lymphocytes Relative: 34 % (ref 12–46)
Lymphs Abs: 2.4 10*3/uL (ref 0.7–4.0)
MCH: 30 pg (ref 26.0–34.0)
MCHC: 37.6 g/dL — ABNORMAL HIGH (ref 30.0–36.0)
MCV: 79.8 fL (ref 78.0–100.0)
Monocytes Absolute: 0.7 10*3/uL (ref 0.1–1.0)
Monocytes Relative: 10 % (ref 3–12)
NEUTROS ABS: 3.8 10*3/uL (ref 1.7–7.7)
Neutrophils Relative %: 54 % (ref 43–77)
PLATELETS: 314 10*3/uL (ref 150–400)
RBC: 4.7 MIL/uL (ref 3.87–5.11)
RDW: 13.4 % (ref 11.5–15.5)
WBC: 7 10*3/uL (ref 4.0–10.5)

## 2014-02-15 LAB — URINE MICROSCOPIC-ADD ON

## 2014-02-15 LAB — URINALYSIS, ROUTINE W REFLEX MICROSCOPIC
Bilirubin Urine: NEGATIVE
Glucose, UA: >1000 mg/dL — AB
Hgb urine dipstick: NEGATIVE
Ketones, ur: NEGATIVE mg/dL
Leukocytes, UA: NEGATIVE
Nitrite: NEGATIVE
Protein, ur: NEGATIVE mg/dL
SPECIFIC GRAVITY, URINE: 1.018 (ref 1.005–1.030)
UROBILINOGEN UA: 0.2 mg/dL (ref 0.0–1.0)
pH: 5.5 (ref 5.0–8.0)

## 2014-02-15 LAB — CBG MONITORING, ED: GLUCOSE-CAPILLARY: 235 mg/dL — AB (ref 70–99)

## 2014-02-15 NOTE — Discharge Instructions (Signed)
I suspect the reason for your frequent urination is because of your hyperglycemia (high blood sugar). This will likely improve if we can get your blood sugars in a better range. Medication adjustments need to be discussed with your PCP though and you need to make an appointment.  Hyperglycemia Hyperglycemia occurs when the glucose (sugar) in your blood is too high. Hyperglycemia can happen for many reasons, but it most often happens to people who do not know they have diabetes or are not managing their diabetes properly.  CAUSES  Whether you have diabetes or not, there are other causes of hyperglycemia. Hyperglycemia can occur when you have diabetes, but it can also occur in other situations that you might not be as aware of, such as: Diabetes  If you have diabetes and are having problems controlling your blood glucose, hyperglycemia could occur because of some of the following reasons:  Not following your meal plan.  Not taking your diabetes medications or not taking it properly.  Exercising less or doing less activity than you normally do.  Being sick. Pre-diabetes  This cannot be ignored. Before people develop Type 2 diabetes, they almost always have "pre-diabetes." This is when your blood glucose levels are higher than normal, but not yet high enough to be diagnosed as diabetes. Research has shown that some long-term damage to the body, especially the heart and circulatory system, may already be occurring during pre-diabetes. If you take action to manage your blood glucose when you have pre-diabetes, you may delay or prevent Type 2 diabetes from developing. Stress  If you have diabetes, you may be "diet" controlled or on oral medications or insulin to control your diabetes. However, you may find that your blood glucose is higher than usual in the hospital whether you have diabetes or not. This is often referred to as "stress hyperglycemia." Stress can elevate your blood glucose. This happens  because of hormones put out by the body during times of stress. If stress has been the cause of your high blood glucose, it can be followed regularly by your caregiver. That way he/she can make sure your hyperglycemia does not continue to get worse or progress to diabetes. Steroids  Steroids are medications that act on the infection fighting system (immune system) to block inflammation or infection. One side effect can be a rise in blood glucose. Most people can produce enough extra insulin to allow for this rise, but for those who cannot, steroids make blood glucose levels go even higher. It is not unusual for steroid treatments to "uncover" diabetes that is developing. It is not always possible to determine if the hyperglycemia will go away after the steroids are stopped. A special blood test called an A1c is sometimes done to determine if your blood glucose was elevated before the steroids were started. SYMPTOMS  Thirsty.  Frequent urination.  Dry mouth.  Blurred vision.  Tired or fatigue.  Weakness.  Sleepy.  Tingling in feet or leg. DIAGNOSIS  Diagnosis is made by monitoring blood glucose in one or all of the following ways:  A1c test. This is a chemical found in your blood.  Fingerstick blood glucose monitoring.  Laboratory results. TREATMENT  First, knowing the cause of the hyperglycemia is important before the hyperglycemia can be treated. Treatment may include, but is not be limited to:  Education.  Change or adjustment in medications.  Change or adjustment in meal plan.  Treatment for an illness, infection, etc.  More frequent blood glucose monitoring.  Change in exercise plan.  Decreasing or stopping steroids.  Lifestyle changes. HOME CARE INSTRUCTIONS   Test your blood glucose as directed.  Exercise regularly. Your caregiver will give you instructions about exercise. Pre-diabetes or diabetes which comes on with stress is helped by exercising.  Eat  wholesome, balanced meals. Eat often and at regular, fixed times. Your caregiver or nutritionist will give you a meal plan to guide your sugar intake.  Being at an ideal weight is important. If needed, losing as little as 10 to 15 pounds may help improve blood glucose levels. SEEK MEDICAL CARE IF:   You have questions about medicine, activity, or diet.  You continue to have symptoms (problems such as increased thirst, urination, or weight gain). SEEK IMMEDIATE MEDICAL CARE IF:   You are vomiting or have diarrhea.  Your breath smells fruity.  You are breathing faster or slower.  You are very sleepy or incoherent.  You have numbness, tingling, or pain in your feet or hands.  You have chest pain.  Your symptoms get worse even though you have been following your caregiver's orders.  If you have any other questions or concerns. Document Released: 05/17/2001 Document Revised: 02/13/2012 Document Reviewed: 03/19/2012 [facility] Patient Information 2014 [place], [place].  Urinary Frequency The number of times a normal person urinates depends upon how much liquid they take in and how much liquid they are losing. If the temperature is hot and there is high humidity then the person will sweat more and usually breathe a little more frequently. These factors decrease the amount of frequency of urination that would be considered normal. The amount you drink is easily determined, but the amount of fluid lost is sometimes more difficult to calculate.  Fluid is lost in two ways:  Sensible fluid loss is usually measured by the amount of urine that you get rid of. Losses of fluid can also occur with diarrhea.  Insensible fluid loss is more difficult to measure. It is caused by evaporation. Insensible loss of fluid occurs through breathing and sweating. It usually ranges from a little less than a quart to a little more than a quart of fluid a day. In normal temperatures and activity levels the average  person may urinate 4 to 7 times in a 24-hour period. Needing to urinate more often than that could indicate a problem. If one urinates 4 to 7 times in 24 hours and has large volumes each time, that could indicate a different problem from one who urinates 4 to 7 times a day and has small volumes. The time of urinating is also an important. Most urinating should be done during the waking hours. Getting up at night to urinate frequently can indicate some problems. CAUSES  The bladder is the organ in your lower abdomen that holds urine. Like a balloon, it swells some as it fills up. Your nerves sense this and tell you it is time to head for the bathroom. There are a number of reasons that you might feel the need to urinate more often than usual. They include:  Urinary tract infection. This is usually associated with other signs such as burning when you urinate.  In men, problems with the prostate (a walnut-size gland that is located near the tube that carries urine out of your body). There are two reasons why the prostate can cause an increased frequency of urination:  An enlarged prostate that does not let the bladder empty well. If the bladder only half empties when you  urinate then it only has half the capacity to fill before you have to urinate again.  The nerves in the bladder become more hypersensitive with an increased size of the prostate even if the bladder empties completely.  Pregnancy.  Obesity. Excess weight is more likely to cause a problem for women more than for men.  Bladder stones or other bladder problems.  Caffeine.  Alcohol.  Medications. For example, drugs that help the body get rid of extra fluid (diuretics) increase urine production. Some other medicines must be taken with lots of fluids.  Muscle or nerve weakness. This might be the result of a spinal cord injury, a stroke, multiple sclerosis or Parkinson's disease.  Long-standing diabetes can decrease the sensation of  the bladder. This loss of sensation makes it harder to sense the bladder needs to be emptied. Over a period of years the bladder is stretched out by constant overfilling. This weakens the bladder muscles so that the bladder does not empty well and has less capacity to fill with new urine.  Interstitial cystitis (also called painful bladder syndrome). This condition develops because the tissues that line the insider of the bladder are inflamed (inflammation is the body's way of reacting to injury or infection). It causes pain and frequent urination. It occurs in women more often than in men. DIAGNOSIS   To decide what might be causing your urinary frequency, your healthcare provider will probably:  Ask about symptoms you have noticed.  Ask about your overall health. This will include questions about any medications you are taking.  Do a physical examination.  Order some tests. These might include:  A blood test to check for diabetes or other health issues that could be contributing to the problem.  Urine testing. This could measure the flow of urine and the pressure on the bladder.  A test of your neurological system (the brain, spinal cord and nerves). This is the system that senses the need to urinate.  A bladder test to check whether it is emptying completely when you urinate.  Cytoscopy. This test uses a thin tube with a tiny camera on it. It offers a look inside your urethra and bladder to see if there are problems.  Imaging tests. You might be given a contrast dye and then asked to urinate. X-rays are taken to see how your bladder is working. TREATMENT  It is important for you to be evaluated to determine if the amount or frequency that you have is unusual or abnormal. If it is found to be abnormal the cause should be determined and this can usually be found out easily. Depending upon the cause treatment could include medication, stimulation of the nerves, or surgery. There are not  too many things that you can do as an individual to change your urinary frequency. It is important that you balance the amount of fluid intake needed to compensate for your activity and the temperature. Medical problems will be diagnosed and taken care of by your physician. There is no particular bladder training such as Kegel's exercises that you can do to help urinary frequency. This is an exercise this is usually done for people who have leaking of urine when they laugh cough or sneeze. HOME CARE INSTRUCTIONS   Take any medications your healthcare provider prescribed or suggested. Follow the directions carefully.  Practice any lifestyle changes that are recommended. These might include:  Drinking less fluid or drinking at different times of the day. If you need to urinate  often during the night, for example, you may need to stop drinking fluids early in the evening.  Cutting down on caffeine or alcohol. They both can make you need to urinate more often than normal. Caffeine is found in coffee, tea and sodas.  Losing weight, if that is recommended.  Keep a journal or a log. You might be asked to record how much you drink and when and when you feel the need to urinate. This will also help evaluate how well the treatment provided by your physician is working. SEEK MEDICAL CARE IF:   Your need to urinate often gets worse.  You feel increased pain or irritation when you urinate.  You notice blood in your urine.  You have questions about any medications that your healthcare provider recommended.  You notice blood, pus or swelling at the site of any test or treatment procedure.  You develop a fever of more than 100.5 F (38.1 C). SEEK IMMEDIATE MEDICAL CARE IF:  You develop a fever of more than 102.0 F (38.9 C). Document Released: 09/17/2009 Document Revised: 02/13/2012 Document Reviewed: 09/17/2009 [facility] Patient Information 2014 [place].

## 2014-02-15 NOTE — ED Notes (Signed)
Pt presents to department for evaluation of urinary frequency and incontinence. Ongoing x1 day. Denies abdominal pain. Pt is alert and oriented x4. No signs of distress noted.

## 2014-02-15 NOTE — ED Notes (Signed)
Assisted [employer] Nursing Student Domenic Moras with In And Out Cath order. Urine returned.

## 2014-02-15 NOTE — ED Provider Notes (Signed)
CSN: [id number]     Arrival date & time 02/15/14  1307 History   First MD Initiated Contact with Patient 02/15/14 1323     Chief Complaint  Patient presents with  . Urinary Frequency     (Consider location/radiation/quality/duration/timing/severity/associated sxs/prior Treatment) HPI Comments: Patient is an 80 year old female with history of diabetes, coronary artery disease, hypertension. She presents today with complaints of urinary frequency. She's been going to the bathroom much more often and in larger quantities than normal. She denies any changes in her diuretic medication. She has not taken her sugar in several days and is unsure if it is high or low. She otherwise has no other complaints. She denies any abdominal pain, burning with urination, fever, vomiting.  Patient is a 80 y.o. female presenting with frequency. The history is provided by the patient.  Urinary Frequency This is a new problem. The current episode started yesterday. The problem occurs constantly. The problem has been gradually worsening. Pertinent negatives include no abdominal pain. Nothing aggravates the symptoms. Nothing relieves the symptoms. She has tried nothing for the symptoms. The treatment provided no relief.    Past Medical History  Diagnosis Date  . Gastric mass   . Hernia of unspecified site of abdominal cavity without mention of obstruction or gangrene     hiatal  . Gastric ulcer   . Thyroid mass   . GERD (gastroesophageal reflux disease)   . Anxiety   . Coronary artery disease   . Osteoarthritis   . Anemia   . Hypertension   . Hyperlipidemia   . Diabetes mellitus, type 2   . Aortic valvular stenosis     moderate to severe  EF on 2D Echo 10/22/2012 65-70%  valve area of 1.2cm, and peak and mean gradients of 45 and 32mmHg   Past Surgical History  Procedure Laterality Date  . Coronary artery bypass graft      x4 in 2003  . Tubal ligation    . Knee surgery    . Uterine polypectomy    .  Internal hemorrhoidectomy     Family History  Problem Relation Age of Onset  . Colon cancer Neg Hx   . Diabetes Mother   . Diabetes Father   . Heart disease Mother   . Stroke Father   . Stroke Mother   . Heart attack Brother    History  Substance Use Topics  . Smoking status: Never Smoker   . Smokeless tobacco: Never Used  . Alcohol Use: No   OB History   Grav Para Term Preterm Abortions TAB SAB Ect Mult Living                 Review of Systems  Gastrointestinal: Negative for abdominal pain.  Genitourinary: Positive for frequency.  All other systems reviewed and are negative.      Allergies  Review of patient's allergies indicates no known allergies.  Home Medications   Current Outpatient Rx  Name  Route  Sig  Dispense  Refill  . acetaminophen (TYLENOL) 500 MG tablet   Oral   Take 500 mg by mouth. Take one by mouth in morning and one at bedtime          . albuterol-ipratropium (COMBIVENT) 18-103 MCG/ACT inhaler   Inhalation   Inhale 2 puffs into the lungs every 6 (six) hours as needed. Shortness of breath         . ALPRAZolam (XANAX) 0.25 MG tablet   Oral   Take  0.5-1 tablets by mouth 2 (two) times daily as needed.         Marland Kitchen BYSTOLIC 5 MG tablet   Oral   Take 10 mg by mouth daily.          . Calcium Carb-Cholecalciferol (CALTRATE 600+D) 600-800 MG-UNIT TABS   Oral   Take 1 tablet by mouth 2 (two) times daily.         Marland Kitchen docusate sodium (COLACE) 100 MG capsule   Oral   Take 100 mg by mouth daily as needed for mild constipation.         Marland Kitchen esomeprazole (NEXIUM) 20 MG capsule   Oral   Take 20 mg by mouth daily at 12 noon.         . fluocinonide cream (LIDEX) 0.05 %   Topical   Apply 1 application topically 2 (two) times daily.         Marland Kitchen HYDROcodone-acetaminophen (NORCO) 5-325 MG per tablet   Oral   Take 1 tablet by mouth every 8 (eight) hours as needed. pain         . insulin detemir (LEVEMIR FLEXPEN) 100 UNIT/ML injection    Subcutaneous   Inject 40 Units into the skin 2 (two) times daily.          . meloxicam (MOBIC) 7.5 MG tablet   Oral   Take 1 tablet by mouth daily.         . memantine (NAMENDA TITRATION PACK) tablet pack   Oral   Take by mouth See admin instructions. 5 mg/day for =1 week; 5 mg twice daily for =1 week; 15 mg/day given in 5 mg and 10 mg separated doses for =1 week; then 10 mg twice daily         . metFORMIN (GLUCOPHAGE) 500 MG tablet   Oral   Take 500 mg by mouth 2 (two) times daily with a meal.          . Multiple Vitamins-Minerals (MULTIVITAL) CHEW   Oral   Chew 1 each by mouth daily.         . Omega-3 Fatty Acids (FISH OIL) 1000 MG CAPS   Oral   Take 1 capsule by mouth daily.         . potassium chloride SA (K-DUR,KLOR-CON) 20 MEQ tablet   Oral   Take 20 mEq by mouth daily.         . rosuvastatin (CRESTOR) 20 MG tablet   Oral   Take 20 mg by mouth daily.           Marland Kitchen triamterene-hydrochlorothiazide (DYAZIDE) 37.5-25 MG per capsule   Oral   Take 1 capsule by mouth every morning.           Marland Kitchen VOLTAREN 1 % GEL   Topical   Apply 1 application topically once.          BP 136/83  Pulse 76  Temp(Src) 98.1 F (36.7 C) (Oral)  Resp 16  Ht 5\' 2"  (1.575 m)  Wt 225 lb (102.059 kg)  BMI 41.14 kg/m2  SpO2 99% Physical Exam  Nursing note and vitals reviewed. Constitutional: She is oriented to person, place, and time. She appears well-developed and well-nourished. No distress.  HENT:  Head: Normocephalic and atraumatic.  Neck: Normal range of motion. Neck supple.  Cardiovascular: Normal rate and regular rhythm.  Exam reveals no gallop and no friction rub.   No murmur heard. Pulmonary/Chest: Effort normal and breath sounds normal. No respiratory  distress. She has no wheezes.  Abdominal: Soft. Bowel sounds are normal. She exhibits no distension. There is no tenderness.  Musculoskeletal: Normal range of motion.  Neurological: She is alert and oriented to  person, place, and time.  Skin: Skin is warm and dry. She is not diaphoretic.    ED Course  Procedures (including critical care time) Labs Review Labs Reviewed  URINALYSIS, ROUTINE W REFLEX MICROSCOPIC  CBC WITH DIFFERENTIAL  COMPREHENSIVE METABOLIC PANEL   Imaging Review No results found.   EKG Interpretation None      MDM   Final diagnoses:  None    Patient is an 80 year old female who presents with complaints of urinary frequency and incontinence that has been going on for the past 24 hours. She is not having any dysuria, abdominal pain, fever, or other complaints. Her sugars were high last week and her insulin dose was increased. Urinalysis today reveals no evidence for infection however there is glucose in the urine. Her fingerstick is 235 and laboratory tests to evaluate renal function and electrolytes are pending. The nurses are having some difficulty obtaining large. At this point care will be signed out to the oncoming bidirectional change.    Veryl Speak, MD 02/15/14 [phone]

## 2014-02-18 ENCOUNTER — Ambulatory Visit: Payer: Medicare Other | Admitting: Physical Therapy

## 2014-02-20 ENCOUNTER — Ambulatory Visit: Payer: Medicare Other | Admitting: Physical Therapy

## 2014-03-06 DIAGNOSIS — E119 Type 2 diabetes mellitus without complications: Secondary | ICD-10-CM | POA: Diagnosis not present

## 2014-03-15 DIAGNOSIS — N182 Chronic kidney disease, stage 2 (mild): Secondary | ICD-10-CM | POA: Diagnosis not present

## 2014-03-15 DIAGNOSIS — R809 Proteinuria, unspecified: Secondary | ICD-10-CM | POA: Diagnosis not present

## 2014-03-15 DIAGNOSIS — E1129 Type 2 diabetes mellitus with other diabetic kidney complication: Secondary | ICD-10-CM | POA: Diagnosis not present

## 2014-03-17 ENCOUNTER — Other Ambulatory Visit (HOSPITAL_COMMUNITY): Payer: Self-pay | Admitting: Nephrology

## 2014-03-17 DIAGNOSIS — N289 Disorder of kidney and ureter, unspecified: Secondary | ICD-10-CM

## 2014-03-26 DIAGNOSIS — E119 Type 2 diabetes mellitus without complications: Secondary | ICD-10-CM | POA: Diagnosis not present

## 2014-03-26 DIAGNOSIS — H2510 Age-related nuclear cataract, unspecified eye: Secondary | ICD-10-CM | POA: Diagnosis not present

## 2014-03-31 ENCOUNTER — Ambulatory Visit (HOSPITAL_COMMUNITY)
Admission: RE | Admit: 2014-03-31 | Discharge: 2014-03-31 | Disposition: A | Discharge status: Home / Self Care | Payer: Medicare Other | Source: Ambulatory Visit | Attending: Nephrology | Admitting: Nephrology

## 2014-03-31 DIAGNOSIS — I509 Heart failure, unspecified: Secondary | ICD-10-CM | POA: Diagnosis not present

## 2014-03-31 DIAGNOSIS — E559 Vitamin D deficiency, unspecified: Secondary | ICD-10-CM | POA: Diagnosis not present

## 2014-03-31 DIAGNOSIS — N281 Cyst of kidney, acquired: Secondary | ICD-10-CM | POA: Diagnosis not present

## 2014-03-31 DIAGNOSIS — R809 Proteinuria, unspecified: Secondary | ICD-10-CM | POA: Diagnosis not present

## 2014-03-31 DIAGNOSIS — N289 Disorder of kidney and ureter, unspecified: Secondary | ICD-10-CM

## 2014-03-31 DIAGNOSIS — D649 Anemia, unspecified: Secondary | ICD-10-CM | POA: Diagnosis not present

## 2014-03-31 DIAGNOSIS — Z79899 Other long term (current) drug therapy: Secondary | ICD-10-CM | POA: Diagnosis not present

## 2014-03-31 DIAGNOSIS — N189 Chronic kidney disease, unspecified: Secondary | ICD-10-CM | POA: Diagnosis not present

## 2014-04-02 DIAGNOSIS — E11329 Type 2 diabetes mellitus with mild nonproliferative diabetic retinopathy without macular edema: Secondary | ICD-10-CM | POA: Diagnosis not present

## 2014-04-02 DIAGNOSIS — E1139 Type 2 diabetes mellitus with other diabetic ophthalmic complication: Secondary | ICD-10-CM | POA: Diagnosis not present

## 2014-04-02 DIAGNOSIS — H26499 Other secondary cataract, unspecified eye: Secondary | ICD-10-CM | POA: Diagnosis not present

## 2014-04-07 DIAGNOSIS — H2510 Age-related nuclear cataract, unspecified eye: Secondary | ICD-10-CM | POA: Diagnosis not present

## 2014-04-09 DIAGNOSIS — H2510 Age-related nuclear cataract, unspecified eye: Secondary | ICD-10-CM | POA: Diagnosis not present

## 2014-04-09 DIAGNOSIS — E11329 Type 2 diabetes mellitus with mild nonproliferative diabetic retinopathy without macular edema: Secondary | ICD-10-CM | POA: Diagnosis not present

## 2014-04-09 DIAGNOSIS — E1139 Type 2 diabetes mellitus with other diabetic ophthalmic complication: Secondary | ICD-10-CM | POA: Diagnosis not present

## 2014-04-09 DIAGNOSIS — H43819 Vitreous degeneration, unspecified eye: Secondary | ICD-10-CM | POA: Diagnosis not present

## 2014-04-15 DIAGNOSIS — I10 Essential (primary) hypertension: Secondary | ICD-10-CM | POA: Diagnosis not present

## 2014-04-15 DIAGNOSIS — IMO0001 Reserved for inherently not codable concepts without codable children: Secondary | ICD-10-CM | POA: Diagnosis not present

## 2014-04-15 DIAGNOSIS — E785 Hyperlipidemia, unspecified: Secondary | ICD-10-CM | POA: Diagnosis not present

## 2014-04-15 DIAGNOSIS — E782 Mixed hyperlipidemia: Secondary | ICD-10-CM | POA: Diagnosis not present

## 2014-04-16 DIAGNOSIS — H269 Unspecified cataract: Secondary | ICD-10-CM | POA: Diagnosis not present

## 2014-04-16 DIAGNOSIS — H2589 Other age-related cataract: Secondary | ICD-10-CM | POA: Diagnosis not present

## 2014-04-16 DIAGNOSIS — H2510 Age-related nuclear cataract, unspecified eye: Secondary | ICD-10-CM | POA: Diagnosis not present

## 2014-04-19 DIAGNOSIS — E871 Hypo-osmolality and hyponatremia: Secondary | ICD-10-CM | POA: Diagnosis not present

## 2014-04-19 DIAGNOSIS — N182 Chronic kidney disease, stage 2 (mild): Secondary | ICD-10-CM | POA: Diagnosis not present

## 2014-04-19 DIAGNOSIS — R809 Proteinuria, unspecified: Secondary | ICD-10-CM | POA: Diagnosis not present

## 2014-04-25 ENCOUNTER — Ambulatory Visit (INDEPENDENT_AMBULATORY_CARE_PROVIDER_SITE_OTHER): Payer: Medicare Other

## 2014-04-25 VITALS — BP 138/70 | HR 64 | Resp 18 | Ht 62.0 in | Wt 225.0 lb

## 2014-04-25 DIAGNOSIS — I2510 Atherosclerotic heart disease of native coronary artery without angina pectoris: Secondary | ICD-10-CM | POA: Diagnosis not present

## 2014-04-25 DIAGNOSIS — M79609 Pain in unspecified limb: Secondary | ICD-10-CM | POA: Diagnosis not present

## 2014-04-25 DIAGNOSIS — L03039 Cellulitis of unspecified toe: Secondary | ICD-10-CM | POA: Diagnosis not present

## 2014-04-25 DIAGNOSIS — E1142 Type 2 diabetes mellitus with diabetic polyneuropathy: Secondary | ICD-10-CM

## 2014-04-25 DIAGNOSIS — E1140 Type 2 diabetes mellitus with diabetic neuropathy, unspecified: Secondary | ICD-10-CM

## 2014-04-25 DIAGNOSIS — E1149 Type 2 diabetes mellitus with other diabetic neurological complication: Secondary | ICD-10-CM

## 2014-04-25 DIAGNOSIS — B351 Tinea unguium: Secondary | ICD-10-CM | POA: Diagnosis not present

## 2014-04-25 MED ORDER — CEPHALEXIN 500 MG PO CAPS: 500.0000 mg | ORAL_CAPSULE | Freq: Three times a day (TID) | ORAL | Status: DC

## 2014-04-25 MED ORDER — SILVER SULFADIAZINE 1 % EX CREA: 1.0000 "application " | TOPICAL_CREAM | Freq: Every day | CUTANEOUS | Status: DC

## 2014-04-25 NOTE — Progress Notes (Signed)
   Subjective:    Patient ID: Katherine [name], female    DOB: 1928/11/07, 80 y.o.   MRN: [id number]  HPI Comments: "I have a loose toenail"  Patient c/o thick, discolored 1st toenail left. The nail is lifted away from the nail bed. She said she just noticed it a few days ago. She can't trim them well. The toe is sore sometimes.     Review of Systems  Constitutional: Positive for unexpected weight change.  HENT: Positive for hearing loss.   Respiratory: Positive for apnea.   Cardiovascular: Positive for leg swelling.  All other systems reviewed and are negative.      Objective:   Physical Exam 80 year old African American female shifts at this time well-developed well-nourished appears to be oriented x3 does have some distress with discomfort and discoloration of her left great toenail is anxious a slight malodor she may drop something on it recent past causing an injury although than unclear about exactly what happened. Or chain objective findings as follows vascular status is intact DP pulses +2/4 PT plus one over 4 bilateral capillary refill time 3 seconds all digits skin temperature is warm turgor normal no edema rubor pallor or varicosities noted. Neurologically epicritic and progressive sensations intact although decreased on Sem[facility] Weinstein testing to forefoot digits and plantar arch is normal plantar response DTRs not listed dermatologic the skin color pigment normal hair growth absent nails thick brittle crumbly friable dystrophic 1 through 5 bilateral however the first left shows separation of the nailbed discoloration and some subungual did debride discharge drainage and malodor. Upon debridement of the left hallux nail a subungual ulceration slight small granulomatous area is identified cleansed and debrided and Silvadene gauze dressing is then applied. Remaining nails also show thick yellowing discoloration and friability consistent with onychomycosis likely initiate topical  antifungal therapy some point in the future on posse after followup visit once the infection has been cleared on the left hallux. Orthopedic exam reveals rectus foot type mild semirigid digital contractures noted patient wearing shoes without socks it did recommend socks and shoes at all times no barefoot or flimsy shoes or flip-flops       Assessment & Plan:  Assessment onychomycosis with dystrophy of nails and presence of diabetes and complications also secondary infection prosthesis with a history of contusion or injury left great toe with lysis or separation of the nail plate from the nailbed mild serous discharge drainage no malodor the ulcer than all nails are debrided at this time the left hallux nail bed is debrided and cleansed with all cleansed Silvadene and Band-Aid dressing applied will maintain Silvadene and Band-Aid dressings and a prescription for cephalexin is called in to pharmacy as well cephalexin 500 mg 3 times a day x10 days recheck in 2 weeks for followup reevaluation maintain appropriate shoes and socks at all times next  Harriet Masson DPM

## 2014-04-25 NOTE — Patient Instructions (Addendum)
Diabetes and Foot Care Diabetes may cause you to have problems because of poor blood supply (circulation) to your feet and legs. This may cause the skin on your feet to become thinner, break easier, and heal more slowly. Your skin may become dry, and the skin may peel and crack. You may also have nerve damage in your legs and feet causing decreased feeling in them. You may not notice minor injuries to your feet that could lead to infections or more serious problems. Taking care of your feet is one of the most important things you can do for yourself.  HOME CARE INSTRUCTIONS  Wear shoes at all times, even in the house. Do not go barefoot. Bare feet are easily injured.  Check your feet daily for blisters, cuts, and redness. If you cannot see the bottom of your feet, use a mirror or ask someone for help.  Wash your feet with warm water (do not use hot water) and mild soap. Then pat your feet and the areas between your toes until they are completely dry. Do not soak your feet as this can dry your skin.  Apply a moisturizing lotion or petroleum jelly (that does not contain alcohol and is unscented) to the skin on your feet and to dry, brittle toenails. Do not apply lotion between your toes.  Trim your toenails straight across. Do not dig under them or around the cuticle. File the edges of your nails with an emery board or nail file.  Do not cut corns or calluses or try to remove them with medicine.  Wear clean socks or stockings every day. Make sure they are not too tight. Do not wear knee-high stockings since they may decrease blood flow to your legs.  Wear shoes that fit properly and have enough cushioning. To break in new shoes, wear them for just a few hours a day. This prevents you from injuring your feet. Always look in your shoes before you put them on to be sure there are no objects inside.  Do not cross your legs. This may decrease the blood flow to your feet.  If you find a minor scrape,  cut, or break in the skin on your feet, keep it and the skin around it clean and dry. These areas may be cleansed with mild soap and water. Do not cleanse the area with peroxide, alcohol, or iodine.  When you remove an adhesive bandage, be sure not to damage the skin around it.  If you have a wound, look at it several times a day to make sure it is healing.  Do not use heating pads or hot water bottles. They may burn your skin. If you have lost feeling in your feet or legs, you may not know it is happening until it is too late.  Make sure your health care provider performs a complete foot exam at least annually or more often if you have foot problems. Report any cuts, sores, or bruises to your health care provider immediately. SEEK MEDICAL CARE IF:   You have an injury that is not healing.  You have cuts or breaks in the skin.  You have an ingrown nail.  You notice redness on your legs or feet.  You feel burning or tingling in your legs or feet.  You have pain or cramps in your legs and feet.  Your legs or feet are numb.  Your feet always feel cold. SEEK IMMEDIATE MEDICAL CARE IF:   There is increasing redness,   swelling, or pain in or around a wound.  There is a red line that goes up your leg.  Pus is coming from a wound.  You develop a fever or as directed by your health care provider.  You notice a bad smell coming from an ulcer or wound. Document Released: 11/18/2000 Document Revised: 07/24/2013 Document Reviewed: 04/30/2013 [facility] Patient Information 2014 [place].     ANTIBACTERIAL SOAP INSTRUCTIONS  THE DAY AFTER PROCEDURE  Please follow the instructions your doctor has marked.   Shower as usual. Before getting out, place a drop of antibacterial liquid soap (Dial) on a wet, clean washcloth.  Gently wipe washcloth over affected area.  Afterward, rinse the area with warm water.  Blot the area dry with a soft cloth and cover with antibiotic ointment  (neosporin, polysporin, bacitracin) and band aid or gauze and tape  Place 3-4 drops of antibacterial liquid soap in a quart of warm tap water.  Submerge foot into water for 20 minutes.  If bandage was applied after your procedure, leave on to allow for easy lift off, then remove and continue with soak for the remaining time.  Next, blot area dry with a soft cloth and cover with a bandage.  Apply other medications as directed by your doctor, such as cortisporin otic solution (eardrops) or neosporin antibiotic ointment  After soaking toed daily and antibacterial soap and water or Epsom salts in warm water dry thoroughly and apply Silvadene and gauze dressing or Band-Aid to the left great toe. Maintain cleansing and soaking for at least 1-2 weeks until wound results

## 2014-05-15 ENCOUNTER — Other Ambulatory Visit (HOSPITAL_COMMUNITY): Payer: Self-pay | Admitting: Urology

## 2014-05-15 DIAGNOSIS — R82998 Other abnormal findings in urine: Secondary | ICD-10-CM | POA: Diagnosis not present

## 2014-05-15 DIAGNOSIS — N289 Disorder of kidney and ureter, unspecified: Secondary | ICD-10-CM | POA: Diagnosis not present

## 2014-05-15 DIAGNOSIS — D4959 Neoplasm of unspecified behavior of other genitourinary organ: Secondary | ICD-10-CM | POA: Diagnosis not present

## 2014-05-15 DIAGNOSIS — N2889 Other specified disorders of kidney and ureter: Secondary | ICD-10-CM

## 2014-05-16 ENCOUNTER — Ambulatory Visit (INDEPENDENT_AMBULATORY_CARE_PROVIDER_SITE_OTHER): Payer: Medicare Other

## 2014-05-16 VITALS — BP 148/82 | HR 82 | Resp 16

## 2014-05-16 DIAGNOSIS — E1142 Type 2 diabetes mellitus with diabetic polyneuropathy: Secondary | ICD-10-CM

## 2014-05-16 DIAGNOSIS — E1149 Type 2 diabetes mellitus with other diabetic neurological complication: Secondary | ICD-10-CM

## 2014-05-16 DIAGNOSIS — B351 Tinea unguium: Secondary | ICD-10-CM

## 2014-05-16 DIAGNOSIS — E1140 Type 2 diabetes mellitus with diabetic neuropathy, unspecified: Secondary | ICD-10-CM

## 2014-05-16 MED ORDER — TAVABOROLE 5 % EX SOLN: CUTANEOUS | Status: DC

## 2014-05-16 NOTE — Progress Notes (Signed)
   Subjective:    Patient ID: Katherine [name], female    DOB: 1928-07-18, 80 y.o.   MRN: [id number]  HPI  Im here for a f/u on my toe nails Review of Systems no new systemic changes or findings noted     Objective:   Physical Exam Neurovascular status is intact and unchanged pedal pulses are palpable a she did have an abscess or ulceration nail bed left great toe with paronychia that has resolved there is no discharge drainage no malodor of the nail plate and nailbed the proximal nail fold to resolve at this time it does have onychomycosis of all nails including left hallux nail is indicated for topical antifungal therapies prescription for keratin is [facility] to [facility] on patient be at patient will initiate topical antifungal medication daily application as instructed for at least 12 months.       Assessment & Plan:  Assessment this time resultant paronychia infection of the left great toe patient continues to have difficulty with onychomycosis dystrophy discoloration of nails will initiate topical antifungal therapies at this time reappointed in 3 months for continued followup and diabetic foot and palliative nail care in the future as needed next  Harriet Masson DPM

## 2014-05-19 ENCOUNTER — Telehealth: Payer: Self-pay | Admitting: *Deleted

## 2014-05-19 NOTE — Telephone Encounter (Signed)
Need verification  we can speak to this patient's daughter, Jaanai Raia concerning prescription for [place].  Let us know if you have any information in regards to this.  I called and gave him the names of the people on her contact list.

## 2014-06-03 ENCOUNTER — Encounter (HOSPITAL_COMMUNITY)
Admission: RE | Admit: 2014-06-03 | Discharge: 2014-06-03 | Disposition: A | Discharge status: Home / Self Care | Payer: Medicare Other | Source: Ambulatory Visit | Attending: Urology | Admitting: Urology

## 2014-06-03 ENCOUNTER — Ambulatory Visit (HOSPITAL_COMMUNITY)
Admission: RE | Admit: 2014-06-03 | Discharge: 2014-06-03 | Disposition: A | Discharge status: Home / Self Care | Payer: Medicare Other | Source: Ambulatory Visit | Attending: Urology | Admitting: Urology

## 2014-06-03 ENCOUNTER — Other Ambulatory Visit (HOSPITAL_COMMUNITY): Payer: Self-pay | Admitting: Urology

## 2014-06-03 DIAGNOSIS — Z951 Presence of aortocoronary bypass graft: Secondary | ICD-10-CM | POA: Insufficient documentation

## 2014-06-03 DIAGNOSIS — D4959 Neoplasm of unspecified behavior of other genitourinary organ: Secondary | ICD-10-CM

## 2014-06-03 DIAGNOSIS — N289 Disorder of kidney and ureter, unspecified: Secondary | ICD-10-CM | POA: Diagnosis not present

## 2014-06-03 DIAGNOSIS — M25569 Pain in unspecified knee: Secondary | ICD-10-CM | POA: Diagnosis not present

## 2014-06-03 DIAGNOSIS — R32 Unspecified urinary incontinence: Secondary | ICD-10-CM | POA: Diagnosis not present

## 2014-06-03 DIAGNOSIS — R0602 Shortness of breath: Secondary | ICD-10-CM | POA: Diagnosis not present

## 2014-06-03 DIAGNOSIS — M25519 Pain in unspecified shoulder: Secondary | ICD-10-CM | POA: Diagnosis not present

## 2014-06-03 DIAGNOSIS — N2889 Other specified disorders of kidney and ureter: Secondary | ICD-10-CM

## 2014-06-03 MED ORDER — TECHNETIUM TC 99M MEDRONATE IV KIT
26.4000 | PACK | Freq: Once | INTRAVENOUS | Status: AC | PRN
  Administered 2014-06-03: 26.4 via INTRAVENOUS

## 2014-06-12 DIAGNOSIS — D4959 Neoplasm of unspecified behavior of other genitourinary organ: Secondary | ICD-10-CM | POA: Diagnosis not present

## 2014-06-17 DIAGNOSIS — R059 Cough, unspecified: Secondary | ICD-10-CM | POA: Diagnosis not present

## 2014-06-17 DIAGNOSIS — R609 Edema, unspecified: Secondary | ICD-10-CM | POA: Diagnosis not present

## 2014-06-17 DIAGNOSIS — R42 Dizziness and giddiness: Secondary | ICD-10-CM | POA: Diagnosis not present

## 2014-06-17 DIAGNOSIS — R05 Cough: Secondary | ICD-10-CM | POA: Diagnosis not present

## 2014-07-07 DIAGNOSIS — R269 Unspecified abnormalities of gait and mobility: Secondary | ICD-10-CM | POA: Diagnosis not present

## 2014-07-07 DIAGNOSIS — M25569 Pain in unspecified knee: Secondary | ICD-10-CM | POA: Diagnosis not present

## 2014-07-07 DIAGNOSIS — R609 Edema, unspecified: Secondary | ICD-10-CM | POA: Diagnosis not present

## 2014-07-07 DIAGNOSIS — M2550 Pain in unspecified joint: Secondary | ICD-10-CM | POA: Diagnosis not present

## 2014-08-01 ENCOUNTER — Ambulatory Visit: Payer: Medicare Other

## 2014-08-05 DIAGNOSIS — M533 Sacrococcygeal disorders, not elsewhere classified: Secondary | ICD-10-CM | POA: Diagnosis not present

## 2014-08-05 DIAGNOSIS — M47817 Spondylosis without myelopathy or radiculopathy, lumbosacral region: Secondary | ICD-10-CM | POA: Diagnosis not present

## 2014-08-08 DIAGNOSIS — R059 Cough, unspecified: Secondary | ICD-10-CM | POA: Diagnosis not present

## 2014-08-08 DIAGNOSIS — R05 Cough: Secondary | ICD-10-CM | POA: Diagnosis not present

## 2014-08-08 DIAGNOSIS — R0602 Shortness of breath: Secondary | ICD-10-CM | POA: Diagnosis not present

## 2014-08-08 DIAGNOSIS — I509 Heart failure, unspecified: Secondary | ICD-10-CM | POA: Diagnosis not present

## 2014-08-19 ENCOUNTER — Ambulatory Visit (INDEPENDENT_AMBULATORY_CARE_PROVIDER_SITE_OTHER): Payer: Medicare Other

## 2014-08-19 DIAGNOSIS — F0390 Unspecified dementia without behavioral disturbance: Secondary | ICD-10-CM | POA: Diagnosis not present

## 2014-08-19 DIAGNOSIS — N390 Urinary tract infection, site not specified: Secondary | ICD-10-CM | POA: Diagnosis not present

## 2014-08-19 DIAGNOSIS — E1140 Type 2 diabetes mellitus with diabetic neuropathy, unspecified: Secondary | ICD-10-CM

## 2014-08-19 DIAGNOSIS — R0602 Shortness of breath: Secondary | ICD-10-CM | POA: Diagnosis not present

## 2014-08-19 DIAGNOSIS — B351 Tinea unguium: Secondary | ICD-10-CM | POA: Diagnosis not present

## 2014-08-19 DIAGNOSIS — M79609 Pain in unspecified limb: Secondary | ICD-10-CM

## 2014-08-19 DIAGNOSIS — R609 Edema, unspecified: Secondary | ICD-10-CM | POA: Diagnosis not present

## 2014-08-19 DIAGNOSIS — E119 Type 2 diabetes mellitus without complications: Secondary | ICD-10-CM | POA: Diagnosis not present

## 2014-08-19 DIAGNOSIS — M79676 Pain in unspecified toe(s): Secondary | ICD-10-CM

## 2014-08-19 DIAGNOSIS — E782 Mixed hyperlipidemia: Secondary | ICD-10-CM | POA: Diagnosis not present

## 2014-08-19 DIAGNOSIS — I10 Essential (primary) hypertension: Secondary | ICD-10-CM | POA: Diagnosis not present

## 2014-08-19 DIAGNOSIS — L03039 Cellulitis of unspecified toe: Secondary | ICD-10-CM

## 2014-08-19 NOTE — Patient Instructions (Signed)
Diabetes and Foot Care Diabetes may cause you to have problems because of poor blood supply (circulation) to your feet and legs. This may cause the skin on your feet to become thinner, break easier, and heal more slowly. Your skin may become dry, and the skin may peel and crack. You may also have nerve damage in your legs and feet causing decreased feeling in them. You may not notice minor injuries to your feet that could lead to infections or more serious problems. Taking care of your feet is one of the most important things you can do for yourself.  HOME CARE INSTRUCTIONS  Wear shoes at all times, even in the house. Do not go barefoot. Bare feet are easily injured.  Check your feet daily for blisters, cuts, and redness. If you cannot see the bottom of your feet, use a mirror or ask someone for help.  Wash your feet with warm water (do not use hot water) and mild soap. Then pat your feet and the areas between your toes until they are completely dry. Do not soak your feet as this can dry your skin.  Apply a moisturizing lotion or petroleum jelly (that does not contain alcohol and is unscented) to the skin on your feet and to dry, brittle toenails. Do not apply lotion between your toes.  Trim your toenails straight across. Do not dig under them or around the cuticle. File the edges of your nails with an emery board or nail file.  Do not cut corns or calluses or try to remove them with medicine.  Wear clean socks or stockings every day. Make sure they are not too tight. Do not wear knee-high stockings since they may decrease blood flow to your legs.  Wear shoes that fit properly and have enough cushioning. To break in new shoes, wear them for just a few hours a day. This prevents you from injuring your feet. Always look in your shoes before you put them on to be sure there are no objects inside.  Do not cross your legs. This may decrease the blood flow to your feet.  If you find a minor scrape,  cut, or break in the skin on your feet, keep it and the skin around it clean and dry. These areas may be cleansed with mild soap and water. Do not cleanse the area with peroxide, alcohol, or iodine.  When you remove an adhesive bandage, be sure not to damage the skin around it.  If you have a wound, look at it several times a day to make sure it is healing.  Do not use heating pads or hot water bottles. They may burn your skin. If you have lost feeling in your feet or legs, you may not know it is happening until it is too late.  Make sure your health care provider performs a complete foot exam at least annually or more often if you have foot problems. Report any cuts, sores, or bruises to your health care provider immediately. SEEK MEDICAL CARE IF:   You have an injury that is not healing.  You have cuts or breaks in the skin.  You have an ingrown nail.  You notice redness on your legs or feet.  You feel burning or tingling in your legs or feet.  You have pain or cramps in your legs and feet.  Your legs or feet are numb.  Your feet always feel cold. SEEK IMMEDIATE MEDICAL CARE IF:   There is increasing redness,   swelling, or pain in or around a wound.  There is a red line that goes up your leg.  Pus is coming from a wound.  You develop a fever or as directed by your health care provider.  You notice a bad smell coming from an ulcer or wound. Document Released: 11/18/2000 Document Revised: 07/24/2013 Document Reviewed: 04/30/2013 [employer] Patient Information 2015 [employer]. This information is not intended to replace advice given to you by your health care provider. Make sure you discuss any questions you have with your health care provider.  

## 2014-08-19 NOTE — Progress Notes (Signed)
   Subjective:    Patient ID: Katherine [name], female    DOB: 1927/12/12, 80 y.o.   MRN: [id number]  HPI Comments: Pt presents for clipping of 10 thick, elongated toenails.     Review of Systems no new findings or systemic changes in the     Objective:   Physical Exam Extremity objective findings unchanged vascular status is intact although diminished DP +2 for PT plus one over 4 bilateral capillary refill time 3 seconds mild edema noted nails thick brittle crumbly friable dystrophic patient not been applying topical antifungal advised to give her nutrition for [facility] and start getting her keratin. Patient's thick brittle dystrophic from criptotic nails 1 through 5 bilateral painful tender with enclosed shoe and ambulation orthopedic biomechanical is unremarkable does have hammertoe digital contractures as well as HAV deformity. No open wounds or ulcers no other new changes noted patient did request diabetic shoes at this time having difficulty with certain shoes or findings shoes to fit comfortably to the toe contractures to       Assessment & Plan:  Assessment diabetes with history peripheral neuropathy of paronychia is resolved nails 1 through 5 bilateral painful tender symptomatic and mycotic nails debrided initiate topical keratin application as instructed given information to call the pharmacy to arrange delivery. Recheck in 3 months for continued palliative care obtain authorization for diabetic shoes in her behalf through primary physician Dr. Nevada Crane recheck in 3 months for palliative care  Harriet Masson DPM

## 2014-08-27 ENCOUNTER — Telehealth: Payer: Self-pay | Admitting: *Deleted

## 2014-08-27 MED ORDER — TAVABOROLE 5 % EX SOLN: CUTANEOUS | Status: DC

## 2014-08-27 NOTE — Telephone Encounter (Signed)
Thank you.  I called the pharmacy like he asked me to about the [place] to see how much it would cost.  They said it would be $35 each time.  I went to the pharmacy to ask them.  They said to get you to send a prescription over and then they could tell me how much it would be.  I told her I would send it over to the pharmacy.  She asked that it be sent to [facility] in [place].  I e-scribed the prescription.

## 2014-09-03 ENCOUNTER — Telehealth: Payer: Self-pay | Admitting: *Deleted

## 2014-09-03 NOTE — Telephone Encounter (Signed)
I called and spoke to the patient's daughter, Minette Brine.  I informed her that the medicine is not covered by her insurance through [employer].  I told her that Dr. Blenda Mounts said to use Fungi-nail over the counter but be mindful that is not as good as Kerydin.  She stated, "[facility] had called and told me yesterday.  I'm going to check and see if she wants to pay $35 for the other manufacturer.  I'll let you know if she does."

## 2014-09-07 ENCOUNTER — Emergency Department (HOSPITAL_COMMUNITY): Payer: Medicare Other

## 2014-09-07 ENCOUNTER — Encounter (HOSPITAL_COMMUNITY): Payer: Self-pay | Admitting: Emergency Medicine

## 2014-09-07 ENCOUNTER — Emergency Department (HOSPITAL_COMMUNITY)
Admission: EM | Admit: 2014-09-07 | Discharge: 2014-09-07 | Disposition: A | Discharge status: Home / Self Care | Payer: Medicare Other | Attending: Emergency Medicine | Admitting: Emergency Medicine

## 2014-09-07 DIAGNOSIS — I10 Essential (primary) hypertension: Secondary | ICD-10-CM

## 2014-09-07 DIAGNOSIS — R0789 Other chest pain: Secondary | ICD-10-CM

## 2014-09-07 DIAGNOSIS — Z791 Long term (current) use of non-steroidal anti-inflammatories (NSAID): Secondary | ICD-10-CM | POA: Diagnosis not present

## 2014-09-07 DIAGNOSIS — I509 Heart failure, unspecified: Secondary | ICD-10-CM | POA: Insufficient documentation

## 2014-09-07 DIAGNOSIS — R079 Chest pain, unspecified: Secondary | ICD-10-CM | POA: Diagnosis not present

## 2014-09-07 DIAGNOSIS — I517 Cardiomegaly: Secondary | ICD-10-CM | POA: Diagnosis not present

## 2014-09-07 DIAGNOSIS — R011 Cardiac murmur, unspecified: Secondary | ICD-10-CM | POA: Diagnosis not present

## 2014-09-07 DIAGNOSIS — I350 Nonrheumatic aortic (valve) stenosis: Secondary | ICD-10-CM

## 2014-09-07 DIAGNOSIS — I359 Nonrheumatic aortic valve disorder, unspecified: Secondary | ICD-10-CM | POA: Diagnosis not present

## 2014-09-07 DIAGNOSIS — Z794 Long term (current) use of insulin: Secondary | ICD-10-CM | POA: Diagnosis not present

## 2014-09-07 DIAGNOSIS — Z792 Long term (current) use of antibiotics: Secondary | ICD-10-CM | POA: Insufficient documentation

## 2014-09-07 DIAGNOSIS — E119 Type 2 diabetes mellitus without complications: Secondary | ICD-10-CM | POA: Insufficient documentation

## 2014-09-07 DIAGNOSIS — Z951 Presence of aortocoronary bypass graft: Secondary | ICD-10-CM

## 2014-09-07 DIAGNOSIS — I2510 Atherosclerotic heart disease of native coronary artery without angina pectoris: Secondary | ICD-10-CM | POA: Insufficient documentation

## 2014-09-07 DIAGNOSIS — M1990 Unspecified osteoarthritis, unspecified site: Secondary | ICD-10-CM | POA: Insufficient documentation

## 2014-09-07 DIAGNOSIS — Z862 Personal history of diseases of the blood and blood-forming organs and certain disorders involving the immune mechanism: Secondary | ICD-10-CM | POA: Diagnosis not present

## 2014-09-07 DIAGNOSIS — R05 Cough: Secondary | ICD-10-CM | POA: Diagnosis not present

## 2014-09-07 DIAGNOSIS — E785 Hyperlipidemia, unspecified: Secondary | ICD-10-CM | POA: Insufficient documentation

## 2014-09-07 DIAGNOSIS — Z79899 Other long term (current) drug therapy: Secondary | ICD-10-CM | POA: Diagnosis not present

## 2014-09-07 DIAGNOSIS — K219 Gastro-esophageal reflux disease without esophagitis: Secondary | ICD-10-CM | POA: Insufficient documentation

## 2014-09-07 DIAGNOSIS — F419 Anxiety disorder, unspecified: Secondary | ICD-10-CM | POA: Insufficient documentation

## 2014-09-07 HISTORY — DX: Heart failure, unspecified: I50.9

## 2014-09-07 LAB — COMPREHENSIVE METABOLIC PANEL
ALT: 12 U/L (ref 0–35)
AST: 14 U/L (ref 0–37)
Albumin: 3.6 g/dL (ref 3.5–5.2)
Alkaline Phosphatase: 90 U/L (ref 39–117)
Anion gap: 12 (ref 5–15)
BILIRUBIN TOTAL: 0.3 mg/dL (ref 0.3–1.2)
BUN: 19 mg/dL (ref 6–23)
CALCIUM: 10.4 mg/dL (ref 8.4–10.5)
CHLORIDE: 100 meq/L (ref 96–112)
CO2: 26 mEq/L (ref 19–32)
CREATININE: 0.83 mg/dL (ref 0.50–1.10)
GFR calc Af Amer: 73 mL/min — ABNORMAL LOW (ref >90)
GFR, EST NON AFRICAN AMERICAN: 63 mL/min — AB (ref >90)
Glucose, Bld: 168 mg/dL — ABNORMAL HIGH (ref 70–99)
Potassium: 4.3 mEq/L (ref 3.7–5.3)
Sodium: 138 mEq/L (ref 137–147)
Total Protein: 7.5 g/dL (ref 6.0–8.3)

## 2014-09-07 LAB — CBC
HEMATOCRIT: 36.1 % (ref 36.0–46.0)
Hemoglobin: 12.9 g/dL (ref 12.0–15.0)
MCH: 28 pg (ref 26.0–34.0)
MCHC: 35.7 g/dL (ref 30.0–36.0)
MCV: 78.3 fL (ref 78.0–100.0)
PLATELETS: 335 10*3/uL (ref 150–400)
RBC: 4.61 MIL/uL (ref 3.87–5.11)
RDW: 13.7 % (ref 11.5–15.5)
WBC: 6.3 10*3/uL (ref 4.0–10.5)

## 2014-09-07 LAB — I-STAT TROPONIN, ED: Troponin i, poc: 0.01 ng/mL (ref 0.00–0.08)

## 2014-09-07 LAB — PRO B NATRIURETIC PEPTIDE: Pro B Natriuretic peptide (BNP): 655.6 pg/mL — ABNORMAL HIGH (ref 0–450)

## 2014-09-07 NOTE — ED Notes (Signed)
[employer] at bedside.  

## 2014-09-07 NOTE — Consult Note (Signed)
Admit date: 09/07/2014 Referring Physician  Dr. Maryan Rued Primary Physician Delphina Cahill, MD Primary Cardiologist  Dr. Debara Pickett Reason for Consultation  Abnormal CXR with cardiomegaly, change from prior.   HPI: 80 year-old female status post bypass surgery in 2003, morbid obesity, moderate aortic stenosis likely mean gradient of 30 mm of mercury, diabetes who over the past 4 weeks has been battling a cough with mucus production. Her primary physician Dr. [place] Crane has prescribed medication, Lasix 20 mg.  She presented to the emergency room today because with cough she has noted occasional episodes of chest discomfort, 5 in total, left-sided, sharp, aching usually lasting between 5 and 10 minutes in duration. In association, she has had worsening mild leg edema, congestion, shortness of breath.  She underwent nuclear stress test in January of 2015 which showed no ischemia.   A chest x-ray was performed in emergency room, PA and lateral, semierect which demonstrated moderate cardiomegaly with marked enlargement from prior chest x-ray in June of 2015. Radiology report reads moderate cardiomegaly. No signs of edema. BNP 655. Troponin normal. Creatinine 0.83, ALT normal. Hemoglobin 12.9.  She appears fairly well currently however we were asked to see her because of marked change in chest x-ray.    PMH:   Past Medical History  Diagnosis Date  . Gastric mass   . Hernia of unspecified site of abdominal cavity without mention of obstruction or gangrene     hiatal  . Gastric ulcer   . Thyroid mass   . GERD (gastroesophageal reflux disease)   . Anxiety   . Coronary artery disease   . Osteoarthritis   . Anemia   . Hypertension   . Hyperlipidemia   . Diabetes mellitus, type 2   . Aortic valvular stenosis     moderate to severe  EF on 2D Echo 10/22/2012 65-70%  valve area of 1.2cm, and peak and mean gradients of 45 and 35mmHg  . CHF (congestive heart failure)     PSH:   Past Surgical History    Procedure Laterality Date  . Coronary artery bypass graft      x4 in 2003  . Tubal ligation    . Knee surgery    . Uterine polypectomy    . Internal hemorrhoidectomy     Allergies:  Review of patient's allergies indicates no known allergies. Prior to Admit Meds:   Prior to Admission medications   Medication Sig Start Date End Date Taking? Authorizing Provider  acetaminophen (TYLENOL) 500 MG tablet Take 1,000 mg by mouth 2 (two) times daily. Take 1000 mg by mouth in the morning and take 1000 mg by mouth in the evening.   Yes Historical Provider, MD  ALPRAZolam (XANAX) 0.25 MG tablet Take 0.125-0.25 tablets by mouth 2 (two) times daily as needed for anxiety or sleep.  10/01/13  Yes Historical Provider, MD  COMBIVENT RESPIMAT 20-100 MCG/ACT AERS respimat Inhale 2 puffs into the lungs every 6 (six) hours as needed for wheezing or shortness of breath.  03/25/14  Yes Historical Provider, MD  Dextromethorphan-Guaifenesin (MUCINEX DM MAXIMUM STRENGTH) 60-1200 MG TB12 Take 1 tablet by mouth daily.   Yes Historical Provider, MD  furosemide (LASIX) 20 MG tablet Take 20 mg by mouth daily.   Yes Historical Provider, MD  HYDROcodone-acetaminophen (NORCO) 5-325 MG per tablet Take 1 tablet by mouth daily as needed for moderate pain.    Yes Historical Provider, MD  insulin detemir (LEVEMIR FLEXPEN) 100 UNIT/ML injection Inject 48 Units into the skin  2 (two) times daily.    Yes Historical Provider, MD  Linaclotide Rolan Lipa) 145 MCG CAPS capsule Take 145 mcg by mouth daily as needed (for constipation).   Yes Historical Provider, MD  lisinopril (PRINIVIL,ZESTRIL) 5 MG tablet Take 5 mg by mouth daily.   Yes Historical Provider, MD  loperamide (IMODIUM) 2 MG capsule Take 2 mg by mouth as needed for diarrhea or loose stools.   Yes Historical Provider, MD  meloxicam (MOBIC) 7.5 MG tablet Take 7.5 mg by mouth daily.   Yes Historical Provider, MD  Memantine HCl ER (NAMENDA XR) 28 MG CP24 Take 28 mg by mouth every  morning.   Yes Historical Provider, MD  metFORMIN (GLUCOPHAGE-XR) 500 MG 24 hr tablet Take 500 mg by mouth 2 (two) times daily.   Yes Historical Provider, MD  nebivolol (BYSTOLIC) 10 MG tablet Take 10 mg by mouth every morning.   Yes Historical Provider, MD  NEXIUM 20 MG capsule Take 20 mg by mouth daily.  04/25/14  Yes Historical Provider, MD  NON FORMULARY Apply 1-2 application topically 4 (four) times daily. DIC 3 % / BAC 2 % / GAB 5 % / LID-PRIL 2.5 % - 2.5 % / MENT 1 %   Yes Historical Provider, MD  potassium chloride SA (K-DUR,KLOR-CON) 20 MEQ tablet Take 20 mEq by mouth daily.   Yes Historical Provider, MD  rosuvastatin (CRESTOR) 20 MG tablet Take 20 mg by mouth at bedtime.    Yes Historical Provider, MD  VOLTAREN 1 % GEL Apply 2 g topically daily as needed (pain).  03/25/13  Yes Historical Provider, MD   Fam HX:    Family History  Problem Relation Age of Onset  . Colon cancer Neg Hx   . Diabetes Mother   . Diabetes Father   . Heart disease Mother   . Stroke Father   . Stroke Mother   . Heart attack Brother    Social HX:    History   Social History  . Marital Status: Widowed    Spouse Name: N/A    Number of Children: 4  . Years of Education: N/A   Occupational History  . Not on file.   Social History Main Topics  . Smoking status: Never Smoker   . Smokeless tobacco: Never Used  . Alcohol Use: No  . Drug Use: No  . Sexual Activity: Not on file   Other Topics Concern  . Not on file   Social History Narrative  . No narrative on file     ROS:  Denies any chills, syncope, significant orthopnea, strokelike symptoms, rash. All 11 ROS were addressed and are negative except what is stated in the HPI   Physical Exam: Blood pressure 159/64, pulse 63, temperature 98.2 F (36.8 C), temperature source Oral, resp. rate 20, SpO2 96.00%.   General: Pleasant, Well developed, well nourished, in no acute distress Head: Eyes PERRLA, No xanthomas.   Normal cephalic and  atramatic  Lungs:   Clear bilaterally to auscultation and percussion. Normal respiratory effort. No wheezes, no rales. Heart:   HRRR S1 S2 Pulses are 2+ & equal. 3/6 systolic murmur, no rubs, gallops.  No carotid bruit. No JVD.  No abdominal bruits. Bypass scar Abdomen: Bowel sounds are positive, abdomen soft and non-tender without masses. No hepatosplenomegaly. Obese Msk:  Back normal. Normal strength and tone for age. Extremities:  No clubbing, cyanosis, chronic-appearing lower extremity edema.  DP +1 Neuro: Alert and oriented X 3, non-focal, MAE x 4  GU: Deferred Rectal: Deferred Psych:  Good affect, responds appropriately      Labs: Lab Results  Component Value Date   WBC 6.3 09/07/2014   HGB 12.9 09/07/2014   HCT 36.1 09/07/2014   MCV 78.3 09/07/2014   PLT 335 09/07/2014     Recent Labs Lab 09/07/14 1214  NA 138  K 4.3  CL 100  CO2 26  BUN 19  CREATININE 0.83  CALCIUM 10.4  PROT 7.5  BILITOT 0.3  ALKPHOS 90  ALT 12  AST 14  GLUCOSE 168*     Radiology:  Dg Chest 2 View  09/07/2014   CLINICAL DATA:  Cough w/ clear phlegm and SOB for 3 wks; left sided chest pain for 3 days; h/o CABG in 2003; h/o diabetes and HTN  EXAM: CHEST - 2 VIEW  COMPARISON:  06/03/2014  FINDINGS: Previous CABG. Moderate cardiomegaly. Mild central pulmonary vascular congestion. No focal airspace disease. Tortuous aorta. No effusion.  No pneumothorax.  IMPRESSION: 1. Cardiomegaly without overt edema.   Electronically Signed   By: Arne Cleveland M.D.   On: 09/07/2014 12:49   this is a marked change from prior. Left cardiac border appears to approximate the lateral rib cage. Personally viewed.  EKG:  09/07/14-sinus rhythm, normal voltage QRS, no other changes. Personally viewed.   ASSESSMENT/PLAN:    80 year old female with coronary artery disease status post bypass with moderate aortic stenosis, diabetes, hypertension, hyperlipidemia, obesity with 4 weeks of worsening congestion, cough, minor lower  cavity edema with recent initiation of low-dose Lasix 20 mg in her primary care's office with intermittent, atypical chest discomfort and chest x-ray demonstrating moderate cardiomegaly, a significant change from prior chest x-ray.  1. Cardiomegaly-echocardiogram performed and demonstrates normal left ventricular contours, normal left ventricular ejection fraction, no evidence of pericardial effusion. There is no evidence of dilated cardiomyopathy. Reassuring. Her chest x-ray finding is likely spurious in the setting of her semierect positioning although I agree that upon visualization the change is quite striking. Given her current symptoms, mildly increased leg edema, I would advocate increasing her Lasix from 20 mg once a day to 40 mg once a day. She has close followup in the next 2-3 days in Dr. Lysbeth Penner, [facility] clinic. I would check basic metabolic profile at that time. She appears well currently. I'm comfortable with her being discharged from the emergency room. She's not having any chest discomfort currently and I believe that her chest discomfort is likely secondary to musculoskeletal strain in the setting of cough. She has also had suspected GERD in the past as well. Recent nuclear stress test reassuring.   2. Hypertension-stable, no change in medications other than increasing Lasix.  3. Aortic stenosis-echocardiogram demonstrates moderate aortic stenosis but it has advanced slightly and is close to severe. Highest mean gradient seen was 38 mmHg. Highest peak gradient seen was 3.9 m/s.  4. Obesity-continue to encourage weight loss  Please call with any concerns.  Candee Furbish, MD  09/07/2014  2:20 PM

## 2014-09-07 NOTE — ED Notes (Signed)
Patient returned from X-ray 

## 2014-09-07 NOTE — Discharge Instructions (Signed)
Increase lasix to 40 mg daily

## 2014-09-07 NOTE — ED Notes (Signed)
She states shes had a cough with clear sputum "for weeks." She went to her PCP and he increased her lasix with no relief of cough. She states since Friday shes had CP. She reports nothing increases or relieves the pain.

## 2014-09-07 NOTE — ED Notes (Signed)
Patient transported to X-ray 

## 2014-09-07 NOTE — Progress Notes (Signed)
Echo Lab  2D Echocardiogram completed.  [place], RDCS 09/07/2014 2:54 PM

## 2014-09-07 NOTE — ED Provider Notes (Addendum)
CSN: [id number]     Arrival date & time 09/07/14  1119 History   First MD Initiated Contact with Patient 09/07/14 1132     Chief Complaint  Patient presents with  . Chest Pain     (Consider location/radiation/quality/duration/timing/severity/associated sxs/prior Treatment) Patient is a 80 y.o. female presenting with chest pain. The history is provided by the patient and a relative.  Chest Pain Pain location:  L chest Pain quality: aching and sharp   Pain radiates to:  Does not radiate Pain radiates to the back: no   Pain severity:  Moderate Onset quality:  Sudden Duration:  10 minutes Timing:  Intermittent Progression:  Resolved Chronicity:  New Context comment:  For the last 4 weeks pt has had sob, cough and congestion with new leg swelling.  she saw her pcp 4 weeks ago who started her on lasix 20mg  daily however not improving and friday started having intermittent chest pain.  she has had 5 episodes since friday Relieved by:  None tried Worsened by:  Nothing tried Ineffective treatments:  None tried Associated symptoms: cough, lower extremity edema and shortness of breath   Associated symptoms: no abdominal pain, no anorexia, no diaphoresis, no fever, no heartburn, no nausea, no palpitations, no PND, not vomiting and no weakness   Risk factors: coronary artery disease, diabetes mellitus, high cholesterol, hypertension and obesity   Risk factors: no immobilization, no prior DVT/PE, no smoking and no surgery     Past Medical History  Diagnosis Date  . Gastric mass   . Hernia of unspecified site of abdominal cavity without mention of obstruction or gangrene     hiatal  . Gastric ulcer   . Thyroid mass   . GERD (gastroesophageal reflux disease)   . Anxiety   . Coronary artery disease   . Osteoarthritis   . Anemia   . Hypertension   . Hyperlipidemia   . Diabetes mellitus, type 2   . Aortic valvular stenosis     moderate to severe  EF on 2D Echo 10/22/2012 65-70%  valve  area of 1.2cm, and peak and mean gradients of 45 and 63mmHg  . CHF (congestive heart failure)    Past Surgical History  Procedure Laterality Date  . Coronary artery bypass graft      x4 in 2003  . Tubal ligation    . Knee surgery    . Uterine polypectomy    . Internal hemorrhoidectomy     Family History  Problem Relation Age of Onset  . Colon cancer Neg Hx   . Diabetes Mother   . Diabetes Father   . Heart disease Mother   . Stroke Father   . Stroke Mother   . Heart attack Brother    History  Substance Use Topics  . Smoking status: Never Smoker   . Smokeless tobacco: Never Used  . Alcohol Use: No   OB History   Grav Para Term Preterm Abortions TAB SAB Ect Mult Living                 Review of Systems  Constitutional: Negative for fever and diaphoresis.  Respiratory: Positive for cough and shortness of breath.   Cardiovascular: Positive for chest pain. Negative for palpitations and PND.  Gastrointestinal: Negative for heartburn, nausea, vomiting, abdominal pain and anorexia.  Neurological: Negative for weakness.  All other systems reviewed and are negative.     Allergies  Review of patient's allergies indicates no known allergies.  Home Medications  Prior to Admission medications   Medication Sig Start Date End Date Taking? Authorizing Provider  acetaminophen (TYLENOL) 500 MG tablet Take 1,000 mg by mouth 2 (two) times daily. Take one by mouth in morning and one at bedtime    Historical Provider, MD  ALPRAZolam (XANAX) 0.25 MG tablet Take 0.125-0.25 tablets by mouth 2 (two) times daily as needed for anxiety or sleep.  10/01/13   Historical Provider, MD  Calcium Carb-Cholecalciferol (CALTRATE 600+D) 600-800 MG-UNIT TABS Take 1 tablet by mouth every morning.    Historical Provider, MD  cephALEXin (KEFLEX) 500 MG capsule Take 1 capsule (500 mg total) by mouth 3 (three) times daily. 04/25/14   Harriet Masson, DPM  COMBIVENT RESPIMAT 20-100 MCG/ACT AERS respimat   03/25/14   Historical Provider, MD  HYDROcodone-acetaminophen (NORCO) 5-325 MG per tablet Take 1 tablet by mouth every 8 (eight) hours as needed for moderate pain.     Historical Provider, MD  insulin detemir (LEVEMIR FLEXPEN) 100 UNIT/ML injection Inject 44 Units into the skin daily.     Historical Provider, MD  Linaclotide (LINZESS PO) Take 1 tablet by mouth daily as needed (for constipation).    Historical Provider, MD  lisinopril (PRINIVIL,ZESTRIL) 5 MG tablet Take 5 mg by mouth daily.    Historical Provider, MD  meloxicam (MOBIC) 7.5 MG tablet Take 7.5 mg by mouth daily.    Historical Provider, MD  Memantine HCl ER (NAMENDA XR) 28 MG CP24 Take 28 mg by mouth every morning.    Historical Provider, MD  metFORMIN (GLUCOPHAGE) 500 MG tablet Take 500 mg by mouth 2 (two) times daily with a meal.     Historical Provider, MD  Multiple Vitamins-Minerals (MULTIVITAL) CHEW Chew 2 each by mouth every morning.     Historical Provider, MD  nebivolol (BYSTOLIC) 10 MG tablet Take 10 mg by mouth every morning.    Historical Provider, MD  [place] 20 MG capsule  04/25/14   Historical Provider, MD  Omega-3 Fatty Acids (FISH OIL) 1000 MG CAPS Take 2 capsules by mouth every morning.     Historical Provider, MD  polyethylene glycol (MIRALAX / GLYCOLAX) packet Take 17 g by mouth daily as needed for moderate constipation.    Historical Provider, MD  potassium chloride SA (K-DUR,KLOR-CON) 20 MEQ tablet Take 20 mEq by mouth daily.    Historical Provider, MD  rosuvastatin (CRESTOR) 20 MG tablet Take 20 mg by mouth at bedtime.     Historical Provider, MD  silver sulfADIAZINE (SILVADENE) 1 % cream Apply 1 application topically daily. 04/25/14   Harriet Masson, DPM  [place] [facility]) 5 % SOLN Apply one drop each affected toenail once daily for 12 months as instructed 08/27/14   Harriet Masson, DPM  triamterene-hydrochlorothiazide (DYAZIDE) 37.5-25 MG per capsule Take 1 capsule by mouth every morning.      Historical Provider,  MD  VOLTAREN 1 % GEL Apply 2 g topically daily as needed (pain).  03/25/13   Historical Provider, MD   BP 158/76  Pulse 75  Temp(Src) 98.2 F (36.8 C) (Oral)  Resp 16  SpO2 96% Physical Exam  Nursing note and vitals reviewed. Constitutional: She is oriented to person, place, and time. She appears well-developed and well-nourished. No distress.  HENT:  Head: Normocephalic and atraumatic.  Mouth/Throat: Oropharynx is clear and moist.  Eyes: Conjunctivae and EOM are normal. Pupils are equal, round, and reactive to light.  Neck: Normal range of motion. Neck supple.  Cardiovascular: Normal rate, regular rhythm and intact distal  pulses.   Murmur heard.  Systolic murmur is present with a grade of 2/6  Pulmonary/Chest: Effort normal. No respiratory distress. She has no wheezes. She has rales in the right lower field and the left lower field. She exhibits no tenderness.  Abdominal: Soft. She exhibits no distension. There is no tenderness. There is no rebound and no guarding.  Musculoskeletal: Normal range of motion. She exhibits edema. She exhibits no tenderness.  2+ pitting edema to the mid shin  Neurological: She is alert and oriented to person, place, and time.  Skin: Skin is warm and dry. No rash noted. No erythema.  Psychiatric: She has a normal mood and affect. Her behavior is normal.    ED Course  Procedures (including critical care time) Labs Review Labs Reviewed  PRO B NATRIURETIC PEPTIDE - Abnormal; Notable for the following:    Pro B Natriuretic peptide (BNP) 655.6 (*)    All other components within normal limits  COMPREHENSIVE METABOLIC PANEL - Abnormal; Notable for the following:    Glucose, Bld 168 (*)    GFR calc non Af Amer 63 (*)    GFR calc Af Amer 73 (*)    All other components within normal limits  CBC  I-STAT TROPOININ, ED    Imaging Review Dg Chest 2 View  09/07/2014   CLINICAL DATA:  Cough w/ clear phlegm and SOB for 3 wks; left sided chest pain for 3 days;  h/o CABG in 2003; h/o diabetes and HTN  EXAM: CHEST - 2 VIEW  COMPARISON:  06/03/2014  FINDINGS: Previous CABG. Moderate cardiomegaly. Mild central pulmonary vascular congestion. No focal airspace disease. Tortuous aorta. No effusion.  No pneumothorax.  IMPRESSION: 1. Cardiomegaly without overt edema.   Electronically Signed   By: Arne Cleveland M.D.   On: 09/07/2014 12:49     EKG Interpretation   Date/Time:  Sunday September 07 2014 11:26:59 EDT Ventricular Rate:  79 PR Interval:  188 QRS Duration: 74 QT Interval:  386 QTC Calculation: 442 R Axis:   8 Text Interpretation:  Normal sinus rhythm Normal ECG No significant change  since last tracing Confirmed by Maryan Rued  MD, Loree Fee ([id number]) on 09/07/2014  11:38:37 AM      MDM   Final diagnoses:  Acute on chronic congestive heart failure, unspecified congestive heart failure type    Patient here complaining of 4 weeks of cough and congestion with chest pain intermittently that started on Friday. She has had approximately 5 episodes of brief chest pain usually lasting between 5 and 10 minutes and making her feel hot and flushed in the last 2 and half days. She denies any chest pain currently. Patient was placed on Lasix 4 weeks ago 20 mg daily but feels that the swelling and cough at the same.  She denies any infectious symptoms or abdominal pain, vomiting or change in appetite. Per the daughter her weight has been stable. Patient has a significant cardiac history for CABG and patient states she does not normally have chest pain. No prior history of PE and low risk well's criteria at this time.  EKG is normal and unchanged from prior. CBC, CMP, BNP, troponin pending for further evaluation. Patient has an appointment with Dr. Debara Pickett her cardiologist on Tuesday however due to the new onset chest pain and the persistent congestion and leg swelling she decided to come today for further evaluatation  1:43 PM Labs wnl however today on CXR pt has  significant cardiomegaly which was not present in  June's chest x-ray.  Will discuss with cardiology.  No recent echo and myoview stress test in jan of this year was wnl.  2:44 PM Dr. Arlyce Dice will come evaluate and pt with bedside echo which was ok. Dr. Arlyce Dice felt pt could be d/ced home with increasing lasix to 40mg  daily and f/u on tues with Dr Debara Pickett.  Family and pt are ok with plan and voice understanding.  Blanchie Dessert, MD 09/07/14 1445  Blanchie Dessert, MD 09/07/14 [place], MD 09/07/14 [id number]

## 2014-09-09 ENCOUNTER — Ambulatory Visit (INDEPENDENT_AMBULATORY_CARE_PROVIDER_SITE_OTHER): Payer: Medicare Other | Admitting: Cardiology

## 2014-09-09 ENCOUNTER — Encounter: Payer: Self-pay | Admitting: Cardiology

## 2014-09-09 VITALS — BP 146/70 | HR 68 | Ht 62.0 in | Wt 228.6 lb

## 2014-09-09 DIAGNOSIS — I2510 Atherosclerotic heart disease of native coronary artery without angina pectoris: Secondary | ICD-10-CM

## 2014-09-09 DIAGNOSIS — I5030 Unspecified diastolic (congestive) heart failure: Secondary | ICD-10-CM | POA: Diagnosis not present

## 2014-09-09 DIAGNOSIS — I10 Essential (primary) hypertension: Secondary | ICD-10-CM

## 2014-09-09 DIAGNOSIS — I209 Angina pectoris, unspecified: Secondary | ICD-10-CM | POA: Diagnosis not present

## 2014-09-09 DIAGNOSIS — I25119 Atherosclerotic heart disease of native coronary artery with unspecified angina pectoris: Secondary | ICD-10-CM

## 2014-09-09 DIAGNOSIS — I517 Cardiomegaly: Secondary | ICD-10-CM

## 2014-09-09 MED ORDER — FUROSEMIDE 20 MG PO TABS: ORAL_TABLET | ORAL | Status: DC

## 2014-09-09 NOTE — Patient Instructions (Signed)
Lab work today ( bmet )  Chest Xray next Tuesday 09/16/14 before 5:00 pm [facility]  Your physician recommends that you schedule a follow-up appointment in: 3 to 4 weeks with Dr.Hilty  Friday 10/17/14 at 3:00 pm

## 2014-09-09 NOTE — Assessment & Plan Note (Signed)
History of bypass grafting and recent negative nuclear stress test in January 2015. She has had pain but is the same pain she's had ever since she's had surgery she usually has an episode every day.

## 2014-09-09 NOTE — Assessment & Plan Note (Signed)
Controlled.  

## 2014-09-09 NOTE — Assessment & Plan Note (Signed)
Improved from the fourth. She feels better. We'll continue Lasix at 40 mg daily as she still has shortness of breath. Check a basic metabolic panel today and a pro BNP. We will repeat PA and lateral chest x-ray next week to reassess her cardiomegaly.  She will followup in 3-4 weeks with Dr. Debara Pickett.  Lasix may be adjusted according to basic metabolic panel tomorrow

## 2014-09-09 NOTE — Progress Notes (Signed)
09/09/2014   PCP: Delphina Cahill, MD   Chief Complaint  Patient presents with  . Follow-up    pt states that she has sob and swelling. pt states that she doesn't have a chest pain but its "a funny feeling"    Primary Cardiologist:Dr. Alma Friendly   HPI:  80 year-old female status post bypass surgery in 2003, morbid obesity, moderate aortic stenosis likely mean gradient of 30 mm of mercury, diabetes who over the past 4 weeks has been battling a cough with mucus production. Her primary physician Dr. Nevada Crane has prescribed medication, Lasix 20 mg.  She presented to the emergency room today because with cough she has noted occasional episodes of chest discomfort, 5 in total, left-sided, sharp, aching usually lasting between 5 and 10 minutes in duration. In association, she has had worsening mild leg edema, congestion, shortness of breath. .  She underwent nuclear stress test in January of 2015 which showed no ischemia.  A chest x-ray was performed in emergency room, PA and lateral, semierect which demonstrated moderate cardiomegaly with marked enlargement from prior chest x-ray in June of 2015. Radiology report reads moderate cardiomegaly. No signs of edema. BNP 655. Troponin normal. Creatinine 0.83, ALT normal. Hemoglobin 12.9.  She appears fairly well currently however we were asked to see her because of marked change in chest x-ray.  She was seen and evaluated by Dr. Marlou Porch.   Cardiomegaly-echocardiogram performed and demonstrates normal left ventricular contours, normal left ventricular ejection fraction, no evidence of pericardial effusion. There is no evidence of dilated cardiomyopathy. Reassuring. Her chest x-ray finding is likely spurious in the setting of her semierect positioning although I agree that upon visualization the change is quite striking. Given her current symptoms, mildly increased leg edema, I would advocate increasing her Lasix from 20 mg once a day to 40 mg once a  day. She has close followup in the next 2-3 days in Dr. Lysbeth Penner, [facility]. I would check basic metabolic profile at that time. She appears well currently.  And was discharged from the ER.   Today she is here for follow up.  She's not having any chest discomfort currently and I believe that her chest discomfort is likely secondary to musculoskeletal strain in the setting of cough. She has also had suspected GERD in the past as well. Recent nuclear stress test reassuring. Aortic stenosis-echocardiogram demonstrates moderate aortic stenosis but it has advanced slightly and is close to severe. Highest mean gradient seen was 38 mmHg. Highest peak gradient seen was 3.9 m/s. Her breathing is improved and no edema, still with cough clear mucus.     No Known Allergies  Current Outpatient Prescriptions  Medication Sig Dispense Refill  . acetaminophen (TYLENOL) 500 MG tablet Take 1,000 mg by mouth 2 (two) times daily. Take 1000 mg by mouth in the morning and take 1000 mg by mouth in the evening.      Marland Kitchen ALPRAZolam (XANAX) 0.25 MG tablet Take 0.125-0.25 tablets by mouth 2 (two) times daily as needed for anxiety or sleep.       . COMBIVENT RESPIMAT 20-100 MCG/ACT AERS respimat Inhale 2 puffs into the lungs every 6 (six) hours as needed for wheezing or shortness of breath.       . Dextromethorphan-Guaifenesin (MUCINEX DM MAXIMUM STRENGTH) 60-1200 MG TB12 Take 1 tablet by mouth daily.      . Diclofenac Sodium POWD       . HYDROcodone-acetaminophen (Taylor)  5-325 MG per tablet Take 1 tablet by mouth daily as needed for moderate pain.       Marland Kitchen insulin detemir (LEVEMIR FLEXPEN) 100 UNIT/ML injection Inject 48 Units into the skin 2 (two) times daily.       Marland Kitchen lisinopril (PRINIVIL,ZESTRIL) 5 MG tablet Take 5 mg by mouth daily.      Marland Kitchen loperamide (IMODIUM) 2 MG capsule Take 2 mg by mouth as needed for diarrhea or loose stools.      . meloxicam (MOBIC) 7.5 MG tablet Take 7.5 mg by mouth daily.      . Memantine HCl ER  (NAMENDA XR) 28 MG CP24 Take 28 mg by mouth every morning.      . metFORMIN (GLUCOPHAGE-XR) 500 MG 24 hr tablet Take 500 mg by mouth 2 (two) times daily.      . nebivolol (BYSTOLIC) 10 MG tablet Take 10 mg by mouth every morning.      Marland Kitchen NEXIUM 20 MG capsule Take 20 mg by mouth daily.       . NON FORMULARY Apply 1-2 application topically 4 (four) times daily. DIC 3 % / BAC 2 % / GAB 5 % / LID-PRIL 2.5 % - 2.5 % / MENT 1 %      . potassium chloride SA (K-DUR,KLOR-CON) 20 MEQ tablet Take 20 mEq by mouth daily.      . rosuvastatin (CRESTOR) 20 MG tablet Take 20 mg by mouth at bedtime.       . VOLTAREN 1 % GEL Apply 2 g topically daily as needed (pain).       . furosemide (LASIX) 20 MG tablet Take 2 tablets daily  60 tablet  6  . Linaclotide (LINZESS) 145 MCG CAPS capsule Take 145 mcg by mouth daily as needed (for constipation).       Current Facility-Administered Medications  Medication Dose Route Frequency Provider Last Rate Last Dose  . 0.9 %  sodium chloride infusion  500 mL Intravenous Continuous Lafayette Dragon, MD        Past Medical History  Diagnosis Date  . Gastric mass   . Hernia of unspecified site of abdominal cavity without mention of obstruction or gangrene     hiatal  . Gastric ulcer   . Thyroid mass   . GERD (gastroesophageal reflux disease)   . Anxiety   . Coronary artery disease   . Osteoarthritis   . Anemia   . Hypertension   . Hyperlipidemia   . Diabetes mellitus, type 2   . Aortic valvular stenosis     moderate to severe  EF on 2D Echo 10/22/2012 65-70%  valve area of 1.2cm, and peak and mean gradients of 45 and 46mmHg  . CHF (congestive heart failure)     Past Surgical History  Procedure Laterality Date  . Coronary artery bypass graft      x4 in 2003  . Tubal ligation    . Knee surgery    . Uterine polypectomy    . Internal hemorrhoidectomy      [id number] colds or fevers, mild weight changes Skin:no rashes or ulcers HEENT:no blurred vision, no  congestion CV:see HPI PUL:see HPI GI:no diarrhea constipation or melena, no indigestion GU:no hematuria, no dysuria MS:no joint pain, no claudication Neuro:no syncope, no lightheadedness Endo:+ diabetes- stable, no thyroid disease  Wt Readings from Last 3 Encounters:  09/09/14 228 lb 9.6 oz (103.692 kg)  04/25/14 225 lb (102.059 kg)  02/15/14 225 lb (102.059 kg)  PHYSICAL EXAM BP 146/70  Pulse 68  Ht 5\' 2"  (1.575 m)  Wt 228 lb 9.6 oz (103.692 kg)  BMI 41.80 kg/m2 General:Pleasant affect, NAD Skin:Warm and dry, brisk capillary refill HEENT:normocephalic, sclera clear, mucus membranes moist Neck:supple, no JVD, no bruits  Heart:S1S2 RRR with 2/6 systolic murmur, no gallup, rub or click Lungs:clear without rales, rhonchi, or wheezes [id number], soft, non tender, + BS, do not palpate liver spleen or masses Ext:no lower ext edema, 2+ pedal pulses, 2+ radial pulses Neuro:alert and oriented, MAE, follows commands, + facial symmetry EKG:SR no acute changes.    ASSESSMENT AND PLAN CHF with left ventricular diastolic dysfunction, NYHA class 2 Improved from the fourth. She feels better. We'll continue Lasix at 40 mg daily as she still has shortness of breath. Check a basic metabolic panel today and a pro BNP. We will repeat PA and lateral chest x-ray next week to reassess her cardiomegaly.  She will followup in 3-4 weeks with Dr. Debara Pickett.  Lasix may be adjusted according to basic metabolic panel tomorrow  Coronary atherosclerosis History of bypass grafting and recent negative nuclear stress test in January 2015. She has had pain but is the same pain she's had ever since she's had surgery she usually has an episode every day.  Essential hypertension Controlled

## 2014-09-10 LAB — BASIC METABOLIC PANEL
BUN: 22 mg/dL (ref 6–23)
CALCIUM: 10.2 mg/dL (ref 8.4–10.5)
CO2: 27 mEq/L (ref 19–32)
Chloride: 100 mEq/L (ref 96–112)
Creat: 0.82 mg/dL (ref 0.50–1.10)
Glucose, Bld: 166 mg/dL — ABNORMAL HIGH (ref 70–99)
Potassium: 4.3 mEq/L (ref 3.5–5.3)
SODIUM: 136 meq/L (ref 135–145)

## 2014-09-12 ENCOUNTER — Encounter: Payer: Self-pay | Admitting: Internal Medicine

## 2014-09-19 ENCOUNTER — Ambulatory Visit (INDEPENDENT_AMBULATORY_CARE_PROVIDER_SITE_OTHER): Payer: Medicare Other

## 2014-09-19 DIAGNOSIS — E1140 Type 2 diabetes mellitus with diabetic neuropathy, unspecified: Secondary | ICD-10-CM

## 2014-09-25 DIAGNOSIS — E559 Vitamin D deficiency, unspecified: Secondary | ICD-10-CM | POA: Diagnosis not present

## 2014-09-25 DIAGNOSIS — I10 Essential (primary) hypertension: Secondary | ICD-10-CM | POA: Diagnosis not present

## 2014-09-25 DIAGNOSIS — Z79899 Other long term (current) drug therapy: Secondary | ICD-10-CM | POA: Diagnosis not present

## 2014-09-25 DIAGNOSIS — R809 Proteinuria, unspecified: Secondary | ICD-10-CM | POA: Diagnosis not present

## 2014-09-25 DIAGNOSIS — N182 Chronic kidney disease, stage 2 (mild): Secondary | ICD-10-CM | POA: Diagnosis not present

## 2014-09-25 DIAGNOSIS — D649 Anemia, unspecified: Secondary | ICD-10-CM | POA: Diagnosis not present

## 2014-10-01 DIAGNOSIS — N182 Chronic kidney disease, stage 2 (mild): Secondary | ICD-10-CM | POA: Diagnosis not present

## 2014-10-01 DIAGNOSIS — Z23 Encounter for immunization: Secondary | ICD-10-CM | POA: Diagnosis not present

## 2014-10-15 ENCOUNTER — Ambulatory Visit
Admission: RE | Admit: 2014-10-15 | Discharge: 2014-10-15 | Disposition: A | Discharge status: Home / Self Care | Payer: Medicare Other | Source: Ambulatory Visit | Attending: Cardiology | Admitting: Cardiology

## 2014-10-15 DIAGNOSIS — I517 Cardiomegaly: Secondary | ICD-10-CM

## 2014-10-15 DIAGNOSIS — R05 Cough: Secondary | ICD-10-CM | POA: Diagnosis not present

## 2014-10-16 NOTE — Progress Notes (Signed)
LMTCB

## 2014-10-17 ENCOUNTER — Encounter: Payer: Self-pay | Admitting: Internal Medicine

## 2014-10-17 ENCOUNTER — Ambulatory Visit (INDEPENDENT_AMBULATORY_CARE_PROVIDER_SITE_OTHER): Payer: Medicare Other | Admitting: Internal Medicine

## 2014-10-17 VITALS — BP 150/90 | HR 69 | Ht 62.0 in | Wt 231.5 lb

## 2014-10-17 DIAGNOSIS — I2510 Atherosclerotic heart disease of native coronary artery without angina pectoris: Secondary | ICD-10-CM

## 2014-10-17 DIAGNOSIS — I350 Nonrheumatic aortic (valve) stenosis: Secondary | ICD-10-CM | POA: Diagnosis not present

## 2014-10-17 DIAGNOSIS — Z79899 Other long term (current) drug therapy: Secondary | ICD-10-CM

## 2014-10-17 DIAGNOSIS — R600 Localized edema: Secondary | ICD-10-CM | POA: Insufficient documentation

## 2014-10-17 DIAGNOSIS — Z951 Presence of aortocoronary bypass graft: Secondary | ICD-10-CM | POA: Diagnosis not present

## 2014-10-17 DIAGNOSIS — I5031 Acute diastolic (congestive) heart failure: Secondary | ICD-10-CM

## 2014-10-17 DIAGNOSIS — R0602 Shortness of breath: Secondary | ICD-10-CM | POA: Diagnosis not present

## 2014-10-17 MED ORDER — IPRATROPIUM-ALBUTEROL 20-100 MCG/ACT IN AERS: 2.0000 | INHALATION_SPRAY | Freq: Four times a day (QID) | RESPIRATORY_TRACT | Status: DC | PRN

## 2014-10-17 NOTE — Progress Notes (Signed)
OFFICE NOTE  Chief Complaint:  Edema, dyspnea  Primary Care Physician: Delphina Cahill, MD  HPI:  Katherine [name] is an 80 year old female with a history of coronary disease status post CABG in 2003, morbid obesity, hypertension, dyslipidemia, and diabetes type 2. She had valvular aortic stenosis with a sign of increased gradient on her last echo in November. We repeated that echo on October 22, 2012 which showed actually no significant change in her aortic valve gradient with a valve area of about 1.2 cm, and peak and mean gradients of 45 and 24 mmHg. This is similar to findings in 2012. I suspect that we may have had an incomplete envelop and that her true gradients are closer to 50 and 30 mmHg. However, this would still yield moderate to severe aortic stenosis. Overall though she is asymptomatic at this point and there is no clear recommendation for surgery or procedures at this time. He reports that recently she had a urinary tract infection which is associated with some confusion. Ultimately this is treated and she rebounded quite quickly. She denies any chest pain or worsening shortness of breath. She has had no syncopal episodes. She is stated in the past but she is not interested in a repeat open heart surgical procedure, nor do I think she is a good candidate for that.  She returns today for followup with a repeat echo. This shows fairly stable and gradients and again moderate to severe aortic stenosis with a valve area of about 1-1.1 cm. She has reported a slight increase in lower extremity swelling but also for the past 2 weeks has awakened in the morning with chest pain. The chest discomfort is located over the left anterior chest wall and is somewhat worse with movement and position. It is not necessarily associated with exertion, however she no longer has to walk up stairs as she moved her washing machine and dryer up to the first floor. Her last stress test was in 2011 and was  nonischemic.  Katherine [name] returns today for follow-up. She was recently seen by Cecilie Kicks, FNP, for lower extremity swelling and shortness of breath. She had her Lasix dose increased to 40 mg daily. A chest x-ray was performed which shows cardiomegaly without overt heart failure. BNP was elevated at 655.  A repeat echocardiogram was performed which shows an EF of 65-70%. There is now moderate to more likely severe aortic stenosis with a mean gradient of 34 mmHg and a peak gradient of 59 mmHg. She reports today that there is been mild improvement in her shortness of breath however her weight remains fairly stable. Her leg edema has improved somewhat.  PMHx:  Past Medical History  Diagnosis Date  . Gastric mass   . Hernia of unspecified site of abdominal cavity without mention of obstruction or gangrene     hiatal  . Gastric ulcer   . Thyroid mass   . GERD (gastroesophageal reflux disease)   . Anxiety   . Coronary artery disease   . Osteoarthritis   . Anemia   . Hypertension   . Hyperlipidemia   . Diabetes mellitus, type 2   . Aortic valvular stenosis     moderate to severe  EF on 2D Echo 10/22/2012 65-70%  valve area of 1.2cm, and peak and mean gradients of 45 and 74mmHg  . CHF (congestive heart failure)     Past Surgical History  Procedure Laterality Date  . Coronary artery bypass graft  x4 in 2003  . Tubal ligation    . Knee surgery    . Uterine polypectomy    . Internal hemorrhoidectomy      FAMHx:  Family History  Problem Relation Age of Onset  . Colon cancer Neg Hx   . Diabetes Mother   . Diabetes Father   . Heart disease Mother   . Stroke Father   . Stroke Mother   . Heart attack Brother     SOCHx:   reports that she has never smoked. She has never used smokeless tobacco. She reports that she does not drink alcohol or use illicit drugs.  ALLERGIES:  No Known Allergies  ROS: A comprehensive review of systems was negative except for: Respiratory:  positive for dyspnea on exertion Cardiovascular: positive for lower extremity edema  HOME MEDS: Current Outpatient Prescriptions  Medication Sig Dispense Refill  . acetaminophen (TYLENOL) 500 MG tablet Take 1,000 mg by mouth 2 (two) times daily. Take 1000 mg by mouth in the morning and take 1000 mg by mouth in the evening.    Marland Kitchen ALPRAZolam (XANAX) 0.25 MG tablet Take 0.125-0.25 tablets by mouth 2 (two) times daily as needed for anxiety or sleep.     Marland Kitchen Dextromethorphan-Guaifenesin (MUCINEX DM MAXIMUM STRENGTH) 60-1200 MG TB12 Take 1 tablet by mouth daily.    . Diclofenac Sodium POWD     . furosemide (LASIX) 20 MG tablet Take 2 tablets daily 60 tablet 6  . HYDROcodone-acetaminophen (NORCO) 5-325 MG per tablet Take 1 tablet by mouth daily as needed for moderate pain.     Marland Kitchen insulin detemir (LEVEMIR FLEXPEN) 100 UNIT/ML injection Inject 48 Units into the skin 2 (two) times daily.     . Ipratropium-Albuterol (COMBIVENT RESPIMAT) 20-100 MCG/ACT AERS respimat Inhale 2 puffs into the lungs every 6 (six) hours as needed for wheezing or shortness of breath. 1 Inhaler 2  . Linaclotide (LINZESS) 145 MCG CAPS capsule Take 145 mcg by mouth daily as needed (for constipation).    Marland Kitchen lisinopril (PRINIVIL,ZESTRIL) 5 MG tablet Take 5 mg by mouth daily.    Marland Kitchen loperamide (IMODIUM) 2 MG capsule Take 2 mg by mouth as needed for diarrhea or loose stools.    . meloxicam (MOBIC) 7.5 MG tablet Take 7.5 mg by mouth daily.    . Memantine HCl ER (NAMENDA XR) 28 MG CP24 Take 28 mg by mouth every morning.    . metFORMIN (GLUCOPHAGE-XR) 500 MG 24 hr tablet Take 500 mg by mouth 2 (two) times daily.    . nebivolol (BYSTOLIC) 10 MG tablet Take 10 mg by mouth every morning.    Marland Kitchen NEXIUM 20 MG capsule Take 20 mg by mouth daily.     . NON FORMULARY Apply 1-2 application topically 4 (four) times daily. DIC 3 % / BAC 2 % / GAB 5 % / LID-PRIL 2.5 % - 2.5 % / MENT 1 %    . potassium chloride SA (K-DUR,KLOR-CON) 20 MEQ tablet Take 20 mEq by  mouth daily.    . rosuvastatin (CRESTOR) 20 MG tablet Take 20 mg by mouth at bedtime.     . VOLTAREN 1 % GEL Apply 2 g topically daily as needed (pain).      Current Facility-Administered Medications  Medication Dose Route Frequency Provider Last Rate Last Dose  . 0.9 %  sodium chloride infusion  500 mL Intravenous Continuous Lafayette Dragon, MD        LABS/IMAGING: No results found for this or any  previous visit (from the past 48 hour(s)). No results found.  VITALS: BP 150/90 mmHg  Pulse 69  Ht 5\' 2"  (1.575 m)  Wt 231 lb 8 oz (105.008 kg)  BMI 42.33 kg/m2  EXAM: GEN: Awake, NAD HEENT: PERRLA, EOMI Neck: Thick neck, unable to assess JVP Lungs: Decreased breath sounds bilaterally, no rales Cardiovascular: Regular rate and rhythm, normal S1, no clear S2, 3/6 high-pitched, late onset musical systolic ejection murmur at the right upper sternal border Abdomen: Morbid obese, soft, nontender Extremity: Trace to 1+ bilateral pitting edema Neurologic: Grossly nonfocal Psych: Pleasant mood, normal affect  EKG: Deferred  ASSESSMENT: 1. Chest pain - resolved, low risk NST 01/2014 2. Moderate to more likely severe aortic stenosis - Mean gradient 34 mmHg, peak gradient 59 mmHg 3. Morbid obesity 4. History of coronary artery disease status post CABG in 2003 5. Hypertension - repeat BP was 162/80 6. Dyslipidemia 7. Diabetes type 2  PLAN: 1.   Katherine [name] has progressive aortic stenosis which sounds severe on physical exam. The echocardiogram suggests that it is moderately severe, however she has new diastolic heart failure symptoms. She seems to be responding to increasing doses of Lasix. She is certainly getting close to the point where I would recommend evaluation for aortic valve replacement. She does have a history of coronary artery disease and four-vessel CABG in 2003. Given her weight, prior bypass surgery and comorbidities, I do feel that she would be at high risk for traditional  aortic valve replacement. She may be a good candidate for TAVR. Plan to see her back soon after reviewing laboratory work and she may be referred to our TAVR clinic for evaluation if she is agreeable to it.  [name], MD, [facility] Attending Cardiologist [facility]  Katherine [name] 10/17/2014, 5:22 PM

## 2014-10-17 NOTE — Patient Instructions (Addendum)
Your physician recommends that you return for lab work in: TODAY (bmet, bnp)  Your physician recommends that you schedule a follow-up appointment in: 1 month with Dr. Debara Pickett

## 2014-10-18 LAB — BASIC METABOLIC PANEL
BUN: 22 mg/dL (ref 6–23)
CHLORIDE: 101 meq/L (ref 96–112)
CO2: 24 meq/L (ref 19–32)
Calcium: 10.3 mg/dL (ref 8.4–10.5)
Creat: 0.91 mg/dL (ref 0.50–1.10)
Glucose, Bld: 183 mg/dL — ABNORMAL HIGH (ref 70–99)
Potassium: 4.1 mEq/L (ref 3.5–5.3)
SODIUM: 135 meq/L (ref 135–145)

## 2014-10-18 LAB — BRAIN NATRIURETIC PEPTIDE: Brain Natriuretic Peptide: 202.6 pg/mL — ABNORMAL HIGH (ref 0.0–100.0)

## 2014-10-20 NOTE — Progress Notes (Signed)
LMTCB for lab results.  

## 2014-10-27 ENCOUNTER — Encounter: Payer: Self-pay | Admitting: *Deleted

## 2014-11-04 DIAGNOSIS — I509 Heart failure, unspecified: Secondary | ICD-10-CM | POA: Diagnosis not present

## 2014-11-18 ENCOUNTER — Ambulatory Visit: Payer: Medicare Other

## 2014-11-19 ENCOUNTER — Ambulatory Visit (INDEPENDENT_AMBULATORY_CARE_PROVIDER_SITE_OTHER): Payer: Medicare Other | Admitting: Internal Medicine

## 2014-11-19 ENCOUNTER — Encounter: Payer: Self-pay | Admitting: Internal Medicine

## 2014-11-19 VITALS — BP 176/90 | HR 88 | Ht 62.0 in | Wt 229.5 lb

## 2014-11-19 DIAGNOSIS — I10 Essential (primary) hypertension: Secondary | ICD-10-CM | POA: Diagnosis not present

## 2014-11-19 DIAGNOSIS — I2510 Atherosclerotic heart disease of native coronary artery without angina pectoris: Secondary | ICD-10-CM | POA: Diagnosis not present

## 2014-11-19 DIAGNOSIS — I350 Nonrheumatic aortic (valve) stenosis: Secondary | ICD-10-CM | POA: Diagnosis not present

## 2014-11-19 DIAGNOSIS — I5031 Acute diastolic (congestive) heart failure: Secondary | ICD-10-CM

## 2014-11-19 DIAGNOSIS — Z951 Presence of aortocoronary bypass graft: Secondary | ICD-10-CM

## 2014-11-19 MED ORDER — LISINOPRIL 10 MG PO TABS: 10.0000 mg | ORAL_TABLET | Freq: Every day | ORAL | Status: DC

## 2014-11-19 NOTE — Patient Instructions (Addendum)
Your physician has requested that you have an echocardiogram. Echocardiography is a painless test that uses sound waves to create images of your heart. It provides your doctor with information about the size and shape of your heart and how well your heart's chambers and valves are working. This procedure takes approximately one hour. There are no restrictions for this procedure. >> March 2016  Your physician recommends that you schedule a follow-up appointment after your echo.   Your physician has recommended you make the following change in your medication - INCREASE lisinopril to 10mg  daily

## 2014-11-19 NOTE — Progress Notes (Signed)
OFFICE NOTE  Chief Complaint:  Edema, dyspnea improved  Primary Care Physician: Katherine Cahill, MD  HPI:  Katherine [name] is an 80 year old female with a history of coronary disease status post CABG in 2003, morbid obesity, hypertension, dyslipidemia, and diabetes type 2. She had valvular aortic stenosis with a sign of increased gradient on her last echo in November. We repeated that echo on October 22, 2012 which showed actually no significant change in her aortic valve gradient with a valve area of about 1.2 cm, and peak and mean gradients of 45 and 24 mmHg. This is similar to findings in 2012. I suspect that we may have had an incomplete envelop and that her true gradients are closer to 50 and 30 mmHg. However, this would still yield moderate to severe aortic stenosis. Overall though she is asymptomatic at this point and there is no clear recommendation for surgery or procedures at this time. He reports that recently she had a urinary tract infection which is associated with some confusion. Ultimately this is treated and she rebounded quite quickly. She denies any chest pain or worsening shortness of breath. She has had no syncopal episodes. She is stated in the past but she is not interested in a repeat open heart surgical procedure, nor do I think she is a good candidate for that.  She returns today for followup with a repeat echo. This shows fairly stable and gradients and again moderate to severe aortic stenosis with a valve area of about 1-1.1 cm. She has reported a slight increase in lower extremity swelling but also for the past 2 weeks has awakened in the morning with chest pain. The chest discomfort is located over the left anterior chest wall and is somewhat worse with movement and position. It is not necessarily associated with exertion, however she no longer has to walk up stairs as she moved her washing machine and dryer up to the first floor. Her last stress test was in 2011  and was nonischemic.  Katherine [name] returns today for follow-up. She was recently seen by Katherine Kicks, FNP, for lower extremity swelling and shortness of breath. She had her Lasix dose increased to 40 mg daily. A chest x-ray was performed which shows cardiomegaly without overt heart failure. BNP was elevated at 655.  A repeat echocardiogram was performed which shows an EF of 65-70%. There is now moderate to more likely severe aortic stenosis with a mean gradient of 34 mmHg and a peak gradient of 59 mmHg. She reports today that there is been mild improvement in her shortness of breath however her weight remains fairly stable. Her leg edema has improved somewhat.  Katherine [name] returns today for follow-up. She reports her swelling is improved significantly. Unfortunate she's developed a chest cold. She was advised to use Mucinex to loosen it up. She has had aggressive shortness of breath and easy fatigability. As mentioned above she is close to severe aortic stenosis. We discussed several options including TAVR today, which I think would be the best alternative for her. I would like to repeat her echocardiogram in 5-6 months and see her back at that time. If her mean gradient has increased some, I will refer her to the valve clinic.   PMHx:  Past Medical History  Diagnosis Date  . Gastric mass   . Hernia of unspecified site of abdominal cavity without mention of obstruction or gangrene     hiatal  . Gastric ulcer   . Thyroid  mass   . GERD (gastroesophageal reflux disease)   . Anxiety   . Coronary artery disease   . Osteoarthritis   . Anemia   . Hypertension   . Hyperlipidemia   . Diabetes mellitus, type 2   . Aortic valvular stenosis     moderate to severe  EF on 2D Echo 10/22/2012 65-70%  valve area of 1.2cm, and peak and mean gradients of 45 and 28mmHg  . CHF (congestive heart failure)     Past Surgical History  Procedure Laterality Date  . Coronary artery bypass graft      x4 in 2003  .  Tubal ligation    . Knee surgery    . Uterine polypectomy    . Internal hemorrhoidectomy      FAMHx:  Family History  Problem Relation Age of Onset  . Colon cancer Neg Hx   . Diabetes Mother   . Diabetes Father   . Heart disease Mother   . Stroke Father   . Stroke Mother   . Heart attack Brother     SOCHx:   reports that she has never smoked. She has never used smokeless tobacco. She reports that she does not drink alcohol or use illicit drugs.  ALLERGIES:  No Known Allergies  ROS: A comprehensive review of systems was negative except for: Respiratory: positive for cough and dyspnea on exertion Cardiovascular: positive for lower extremity edema  HOME MEDS: Current Outpatient Prescriptions  Medication Sig Dispense Refill  . acetaminophen (TYLENOL) 500 MG tablet Take 1,000 mg by mouth 2 (two) times daily. Take 1000 mg by mouth in the morning and take 1000 mg by mouth in the evening.    Marland Kitchen ALPRAZolam (XANAX) 0.25 MG tablet Take 0.125-0.25 tablets by mouth 2 (two) times daily as needed for anxiety or sleep.     Marland Kitchen Dextromethorphan-Guaifenesin (MUCINEX DM MAXIMUM STRENGTH) 60-1200 MG TB12 Take 1 tablet by mouth daily.    . furosemide (LASIX) 20 MG tablet Take 2 tablets daily 60 tablet 6  . GuaiFENesin (MUCINEX PO) Take by mouth 2 (two) times daily.    Marland Kitchen HYDROcodone-acetaminophen (NORCO) 5-325 MG per tablet Take 1 tablet by mouth daily as needed for moderate pain.     Marland Kitchen insulin detemir (LEVEMIR FLEXPEN) 100 UNIT/ML injection Inject 48 Units into the skin 2 (two) times daily.     . Ipratropium-Albuterol (COMBIVENT RESPIMAT) 20-100 MCG/ACT AERS respimat Inhale 2 puffs into the lungs every 6 (six) hours as needed for wheezing or shortness of breath. 1 Inhaler 2  . Linaclotide (LINZESS) 145 MCG CAPS capsule Take 145 mcg by mouth daily as needed (for constipation).    Marland Kitchen loperamide (IMODIUM) 2 MG capsule Take 2 mg by mouth as needed for diarrhea or loose stools.    . meloxicam (MOBIC) 7.5  MG tablet Take 7.5 mg by mouth daily.    . Memantine HCl ER (NAMENDA XR) 28 MG CP24 Take 28 mg by mouth every morning.    . metFORMIN (GLUCOPHAGE-XR) 500 MG 24 hr tablet Take 500 mg by mouth 2 (two) times daily.    . nebivolol (BYSTOLIC) 10 MG tablet Take 10 mg by mouth every morning.    Marland Kitchen NEXIUM 20 MG capsule Take 20 mg by mouth daily.     . NON FORMULARY Apply 1-2 application topically 4 (four) times daily. DIC 3 % / BAC 2 % / GAB 5 % / LID-PRIL 2.5 % - 2.5 % / MENT 1 %    .  potassium chloride SA (K-DUR,KLOR-CON) 20 MEQ tablet Take 20 mEq by mouth daily.    . rosuvastatin (CRESTOR) 20 MG tablet Take 20 mg by mouth at bedtime.     . VOLTAREN 1 % GEL Apply 2 g topically daily as needed (pain).     Marland Kitchen lisinopril (PRINIVIL,ZESTRIL) 10 MG tablet Take 1 tablet (10 mg total) by mouth daily. 30 tablet 6   Current Facility-Administered Medications  Medication Dose Route Frequency Provider Last Rate Last Dose  . 0.9 %  sodium chloride infusion  500 mL Intravenous Continuous Lafayette Dragon, MD        LABS/IMAGING: No results found for this or any previous visit (from the past 48 hour(s)). No results found.  VITALS: BP 176/90 mmHg  Pulse 88  Ht 5\' 2"  (1.575 m)  Wt 229 lb 8 oz (104.101 kg)  BMI 41.97 kg/m2  EXAM: GEN: Awake, NAD HEENT: PERRLA, EOMI Neck: Thick neck, unable to assess JVP Lungs: Decreased breath sounds bilaterally, no rales, rhonchorous cough  Cardiovascular: Regular rate and rhythm, normal S1, no clear S2, 3/6 high-pitched, late onset musical systolic ejection murmur at the right upper sternal border Abdomen: Morbid obese, soft, nontender Extremity: Trace to 1+ bilateral pitting edema Neurologic: Grossly nonfocal Psych: Pleasant mood, normal affect  EKG: Deferred  ASSESSMENT: 1. Chest pain - resolved, low risk NST 01/2014 2. Moderate to more likely severe aortic stenosis - Mean gradient 34 mmHg, peak gradient 59 mmHg 3. Morbid obesity 4. History of coronary artery  disease status post CABG in 2003 5. Hypertension - repeat BP was 162/80 6. Dyslipidemia 7. Diabetes type 2  PLAN: 1.   Ms. Kutscher has had improvement in her swelling. Her shortness of breath was better although today she has an upper respiratory infection. Mostly rhonchi may be a bronchitis. I agree with using Eason next. If her symptoms do not improve by Monday she should see her primary care provider. We will repeat her echocardiogram in the spring and if her mean gradient increases further, it will likely be time to refer her to the valve clinic for evaluation for TAVR surgery.  [name], MD, [facility] Attending Cardiologist [facility]  Kasyn Rolph C 11/19/2014, 5:27 PM

## 2014-11-21 ENCOUNTER — Emergency Department (HOSPITAL_COMMUNITY): Payer: Medicare Other

## 2014-11-21 ENCOUNTER — Emergency Department (HOSPITAL_COMMUNITY)
Admission: EM | Admit: 2014-11-21 | Discharge: 2014-11-21 | Disposition: A | Discharge status: Home / Self Care | Payer: Medicare Other | Attending: Emergency Medicine | Admitting: Emergency Medicine

## 2014-11-21 ENCOUNTER — Encounter (HOSPITAL_COMMUNITY): Payer: Self-pay | Admitting: Cardiology

## 2014-11-21 DIAGNOSIS — F419 Anxiety disorder, unspecified: Secondary | ICD-10-CM | POA: Insufficient documentation

## 2014-11-21 DIAGNOSIS — R0981 Nasal congestion: Secondary | ICD-10-CM | POA: Insufficient documentation

## 2014-11-21 DIAGNOSIS — E785 Hyperlipidemia, unspecified: Secondary | ICD-10-CM | POA: Insufficient documentation

## 2014-11-21 DIAGNOSIS — I509 Heart failure, unspecified: Secondary | ICD-10-CM | POA: Insufficient documentation

## 2014-11-21 DIAGNOSIS — Z791 Long term (current) use of non-steroidal anti-inflammatories (NSAID): Secondary | ICD-10-CM | POA: Insufficient documentation

## 2014-11-21 DIAGNOSIS — E119 Type 2 diabetes mellitus without complications: Secondary | ICD-10-CM | POA: Insufficient documentation

## 2014-11-21 DIAGNOSIS — I2510 Atherosclerotic heart disease of native coronary artery without angina pectoris: Secondary | ICD-10-CM | POA: Diagnosis not present

## 2014-11-21 DIAGNOSIS — R05 Cough: Secondary | ICD-10-CM

## 2014-11-21 DIAGNOSIS — Z794 Long term (current) use of insulin: Secondary | ICD-10-CM | POA: Diagnosis not present

## 2014-11-21 DIAGNOSIS — R0602 Shortness of breath: Secondary | ICD-10-CM | POA: Diagnosis not present

## 2014-11-21 DIAGNOSIS — J3489 Other specified disorders of nose and nasal sinuses: Secondary | ICD-10-CM | POA: Diagnosis not present

## 2014-11-21 DIAGNOSIS — Z79899 Other long term (current) drug therapy: Secondary | ICD-10-CM | POA: Insufficient documentation

## 2014-11-21 DIAGNOSIS — B370 Candidal stomatitis: Secondary | ICD-10-CM | POA: Diagnosis not present

## 2014-11-21 DIAGNOSIS — B379 Candidiasis, unspecified: Secondary | ICD-10-CM | POA: Insufficient documentation

## 2014-11-21 DIAGNOSIS — Z951 Presence of aortocoronary bypass graft: Secondary | ICD-10-CM | POA: Diagnosis not present

## 2014-11-21 DIAGNOSIS — I10 Essential (primary) hypertension: Secondary | ICD-10-CM | POA: Insufficient documentation

## 2014-11-21 DIAGNOSIS — R059 Cough, unspecified: Secondary | ICD-10-CM

## 2014-11-21 DIAGNOSIS — K219 Gastro-esophageal reflux disease without esophagitis: Secondary | ICD-10-CM | POA: Insufficient documentation

## 2014-11-21 DIAGNOSIS — R609 Edema, unspecified: Secondary | ICD-10-CM | POA: Insufficient documentation

## 2014-11-21 DIAGNOSIS — I517 Cardiomegaly: Secondary | ICD-10-CM | POA: Diagnosis not present

## 2014-11-21 DIAGNOSIS — Z862 Personal history of diseases of the blood and blood-forming organs and certain disorders involving the immune mechanism: Secondary | ICD-10-CM | POA: Diagnosis not present

## 2014-11-21 DIAGNOSIS — R062 Wheezing: Secondary | ICD-10-CM | POA: Diagnosis not present

## 2014-11-21 HISTORY — DX: Reserved for inherently not codable concepts without codable children: IMO0001

## 2014-11-21 HISTORY — DX: Dyspnea, unspecified: R06.00

## 2014-11-21 HISTORY — DX: Other fatigue: R53.83

## 2014-11-21 LAB — CBC WITH DIFFERENTIAL/PLATELET
BASOS ABS: 0 10*3/uL (ref 0.0–0.1)
Basophils Relative: 0 % (ref 0–1)
Eosinophils Absolute: 0.1 10*3/uL (ref 0.0–0.7)
Eosinophils Relative: 2 % (ref 0–5)
HEMATOCRIT: 35.4 % — AB (ref 36.0–46.0)
Hemoglobin: 12.4 g/dL (ref 12.0–15.0)
Lymphocytes Relative: 20 % (ref 12–46)
Lymphs Abs: 1.7 10*3/uL (ref 0.7–4.0)
MCH: 28.1 pg (ref 26.0–34.0)
MCHC: 35 g/dL (ref 30.0–36.0)
MCV: 80.3 fL (ref 78.0–100.0)
MONO ABS: 0.8 10*3/uL (ref 0.1–1.0)
Monocytes Relative: 9 % (ref 3–12)
NEUTROS ABS: 5.7 10*3/uL (ref 1.7–7.7)
Neutrophils Relative %: 69 % (ref 43–77)
PLATELETS: 306 10*3/uL (ref 150–400)
RBC: 4.41 MIL/uL (ref 3.87–5.11)
RDW: 13.8 % (ref 11.5–15.5)
WBC: 8.3 10*3/uL (ref 4.0–10.5)

## 2014-11-21 LAB — BASIC METABOLIC PANEL
Anion gap: 14 (ref 5–15)
BUN: 16 mg/dL (ref 6–23)
CO2: 25 meq/L (ref 19–32)
Calcium: 9.9 mg/dL (ref 8.4–10.5)
Chloride: 96 mEq/L (ref 96–112)
Creatinine, Ser: 0.86 mg/dL (ref 0.50–1.10)
GFR calc Af Amer: 70 mL/min — ABNORMAL LOW (ref >90)
GFR calc non Af Amer: 60 mL/min — ABNORMAL LOW (ref >90)
GLUCOSE: 155 mg/dL — AB (ref 70–99)
POTASSIUM: 4.3 meq/L (ref 3.7–5.3)
SODIUM: 135 meq/L — AB (ref 137–147)

## 2014-11-21 LAB — PRO B NATRIURETIC PEPTIDE: Pro B Natriuretic peptide (BNP): 555.2 pg/mL — ABNORMAL HIGH (ref 0–450)

## 2014-11-21 LAB — TROPONIN I

## 2014-11-21 MED ORDER — ALBUTEROL SULFATE HFA 108 (90 BASE) MCG/ACT IN AERS: 2.0000 | INHALATION_SPRAY | RESPIRATORY_TRACT | Status: DC | PRN

## 2014-11-21 MED ORDER — NYSTATIN 100000 UNIT/ML MT SUSP: 5.0000 mL | Freq: Four times a day (QID) | OROMUCOSAL | Status: DC

## 2014-11-21 MED ORDER — IPRATROPIUM-ALBUTEROL 0.5-2.5 (3) MG/3ML IN SOLN
3.0000 mL | Freq: Once | RESPIRATORY_TRACT | Status: AC
  Administered 2014-11-21: 3 mL via RESPIRATORY_TRACT
  Filled 2014-11-21: qty 3

## 2014-11-21 MED ORDER — BENZONATATE 100 MG PO CAPS: 100.0000 mg | ORAL_CAPSULE | Freq: Three times a day (TID) | ORAL | Status: DC | PRN

## 2014-11-21 NOTE — ED Provider Notes (Signed)
CSN: [id number]     Arrival date & time 11/21/14  1431 History   First MD Initiated Contact with Patient 11/21/14 1437     Chief Complaint  Patient presents with  . Shortness of Breath      HPI Pt was seen at 1450. Per pt and her family, c/o gradual onset and worsening of persistent moist cough and "wheezing" for the past 2 days. Pt was evaluated by her Cards MD 2 days ago for same, dx bronchitis, rx mucinex. Has been associated with nasal and sinus congestion. Denies increase in weight or chronic peripheral edema. Denies CP/palpitations, no fevers, no abd pain, no N/V/D, no back pain, no rash.    Past Medical History  Diagnosis Date  . Gastric mass   . Hernia of unspecified site of abdominal cavity without mention of obstruction or gangrene     hiatal  . Gastric ulcer   . Thyroid mass   . GERD (gastroesophageal reflux disease)   . Anxiety   . Coronary artery disease   . Osteoarthritis   . Anemia   . Hypertension   . Hyperlipidemia   . Diabetes mellitus, type 2   . Aortic valvular stenosis     moderate to severe  EF on 2D Echo 10/22/2012 65-70%  valve area of 1.2cm, and peak and mean gradients of 45 and 56mmHg  . CHF (congestive heart failure)   . Dyspnea     chronic  . Easy fatigability   . Normal cardiac stress test 12/2013    normal stress nuclear study   Past Surgical History  Procedure Laterality Date  . Coronary artery bypass graft      x4 in 2003  . Tubal ligation    . Knee surgery    . Uterine polypectomy    . Internal hemorrhoidectomy     Family History  Problem Relation Age of Onset  . Colon cancer Neg Hx   . Diabetes Mother   . Diabetes Father   . Heart disease Mother   . Stroke Father   . Stroke Mother   . Heart attack Brother    History  Substance Use Topics  . Smoking status: Never Smoker   . Smokeless tobacco: Never Used  . Alcohol Use: No    Review of Systems ROS: Statement: All systems negative except as marked or noted in the HPI;  Constitutional: Negative for fever and chills. ; ; Eyes: Negative for eye pain, redness and discharge. ; ; ENMT: Negative for ear pain, hoarseness, sore throat. +nasal congestion, sinus pressure. ; ; Cardiovascular: Negative for chest pain, palpitations, diaphoresis, and peripheral edema. ; ; Respiratory: +cough, wheezing. Negative for stridor. ; ; Gastrointestinal: Negative for nausea, vomiting, diarrhea, abdominal pain, blood in stool, hematemesis, jaundice and rectal bleeding. . ; ; Genitourinary: Negative for dysuria, flank pain and hematuria. ; ; Musculoskeletal: Negative for back pain and neck pain. Negative for swelling and trauma.; ; Skin: Negative for pruritus, rash, abrasions, blisters, bruising and skin lesion.; ; Neuro: Negative for headache, lightheadedness and neck stiffness. Negative for weakness, altered level of consciousness , altered mental status, extremity weakness, paresthesias, involuntary movement, seizure and syncope.      Allergies  Review of patient's allergies indicates no known allergies.  Home Medications   Prior to Admission medications   Medication Sig Start Date End Date Taking? Authorizing Provider  acetaminophen (TYLENOL) 500 MG tablet Take 1,000 mg by mouth 2 (two) times daily. Take 1000 mg by mouth in the  morning and take 1000 mg by mouth in the evening.   Yes Historical Provider, MD  ALPRAZolam (XANAX) 0.25 MG tablet Take 0.125-0.25 tablets by mouth 2 (two) times daily as needed for anxiety or sleep.  10/01/13  Yes Historical Provider, MD  Dextromethorphan-Guaifenesin (MUCINEX DM MAXIMUM STRENGTH) 60-1200 MG TB12 Take 1 tablet by mouth daily.   Yes Historical Provider, MD  furosemide (LASIX) 20 MG tablet Take 2 tablets daily Patient taking differently: Take 40 mg by mouth daily.  09/09/14  Yes Isaiah Serge, NP  HYDROcodone-acetaminophen (NORCO) 5-325 MG per tablet Take 1 tablet by mouth daily as needed for moderate pain.    Yes Historical Provider, MD  insulin  detemir (LEVEMIR FLEXPEN) 100 UNIT/ML injection Inject 48 Units into the skin 2 (two) times daily.    Yes Historical Provider, MD  Ipratropium-Albuterol (COMBIVENT RESPIMAT) 20-100 MCG/ACT AERS respimat Inhale 2 puffs into the lungs every 6 (six) hours as needed for wheezing or shortness of breath. 10/17/14  Yes [name], MD  Linaclotide [facility]) 145 MCG CAPS capsule Take 145 mcg by mouth daily as needed (for constipation).   Yes Historical Provider, MD  lisinopril (PRINIVIL,ZESTRIL) 10 MG tablet Take 1 tablet (10 mg total) by mouth daily. 11/19/14  Yes [name], MD  meloxicam (MOBIC) 7.5 MG tablet Take 7.5 mg by mouth daily.   Yes Historical Provider, MD  Memantine HCl ER (NAMENDA XR) 28 MG CP24 Take 28 mg by mouth every morning.   Yes Historical Provider, MD  metFORMIN (GLUCOPHAGE-XR) 500 MG 24 hr tablet Take 500 mg by mouth 2 (two) times daily.   Yes Historical Provider, MD  nebivolol (BYSTOLIC) 10 MG tablet Take 10 mg by mouth every morning.   Yes Historical Provider, MD  NEXIUM 20 MG capsule Take 20 mg by mouth daily.  04/25/14  Yes Historical Provider, MD  NON FORMULARY Apply 1-2 application topically 4 (four) times daily. DIC 3 % / BAC 2 % / GAB 5 % / LID-PRIL 2.5 % - 2.5 % / MENT 1 %   Yes Historical Provider, MD  potassium chloride SA (K-DUR,KLOR-CON) 20 MEQ tablet Take 20 mEq by mouth daily.   Yes Historical Provider, MD  rosuvastatin (CRESTOR) 20 MG tablet Take 20 mg by mouth at bedtime.    Yes Historical Provider, MD  VOLTAREN 1 % GEL Apply 2 g topically daily as needed (pain).  03/25/13  Yes Historical Provider, MD  loperamide (IMODIUM) 2 MG capsule Take 2 mg by mouth as needed for diarrhea or loose stools.    Historical Provider, MD   BP 171/88 mmHg  Pulse 81  Temp(Src) 98.2 F (36.8 C) (Oral)  Resp 13  Ht 5\' 2"  (1.575 m)  Wt 230 lb (104.327 kg)  BMI 42.06 kg/m2  SpO2 98% Physical Exam  1455: Physical examination:  Nursing notes reviewed; Vital signs and O2 SAT  reviewed;  Constitutional: Well developed, Well nourished, Well hydrated, In no acute distress; Head:  Normocephalic, atraumatic; Eyes: EOMI, PERRL, No scleral icterus; ENMT: TM's clear bilat. +edemetous nasal turbinates bilat with clear rhinorrhea. +thrush. Mouth and pharynx without lesions. Mucous membranes moist; Neck: Supple, Full range of motion, No lymphadenopathy; Cardiovascular: Regular rate and rhythm, No gallop; Respiratory: Breath sounds coarse & equal bilaterally, faint wheezes. +moist cough during exam. Speaking full sentences with ease, Normal respiratory effort/excursion; Chest: Nontender, Movement normal; Abdomen: Soft, Nontender, Nondistended, Normal bowel sounds; Genitourinary: No CVA tenderness; Extremities: Pulses normal, No tenderness, +1 pedal edema bilat  without calf asymmetry.; Neuro: AA&Ox3, Major CN grossly intact.  Speech clear. No gross focal motor or sensory deficits in extremities.; Skin: Color normal, Warm, Dry.   ED Course  Procedures     EKG Interpretation   Date/Time:  Friday November 21 2014 15:22:42 EST  repeat Ventricular Rate:  81 PR Interval:  189 QRS Duration: 66 QT Interval:  373 QTC Calculation: 433 R Axis:   2 Text Interpretation:  Sinus rhythm Left ventricular hypertrophy Baseline  wander When compared with ECG of 09/07/2014 No significant change was found  Confirmed by [facility]  MD, Nunzio Cory [phone]) on 11/21/2014 3:30:58 PM      MDM  MDM Reviewed: previous chart, nursing note and vitals Reviewed previous: labs and ECG Interpretation: labs, ECG and x-ray      Date: 11/21/2014 on arrival  Rate: 79  Rhythm: normal sinus rhythm  QRS Axis: normal  Intervals: normal  ST/T Wave abnormalities: normal  Conduction Disutrbances:none  Narrative Interpretation: baseline  Old EKG Reviewed: unchanged; no significant changes compared to previous EKG dated 09/09/2014.   Results for orders placed or performed during the hospital encounter of 11/21/14   Pro b natriuretic peptide (BNP)  Result Value Ref Range   Pro B Natriuretic peptide (BNP) 555.2 (H) 0 - 450 pg/mL  Basic metabolic panel  Result Value Ref Range   Sodium 135 (L) 137 - 147 mEq/L   Potassium 4.3 3.7 - 5.3 mEq/L   Chloride 96 96 - 112 mEq/L   CO2 25 19 - 32 mEq/L   Glucose, Bld 155 (H) 70 - 99 mg/dL   BUN 16 6 - 23 mg/dL   Creatinine, Ser 0.86 0.50 - 1.10 mg/dL   Calcium 9.9 8.4 - 10.5 mg/dL   GFR calc non Af Amer 60 (L) >90 mL/min   GFR calc Af Amer 70 (L) >90 mL/min   Anion gap 14 5 - 15  Troponin I  Result Value Ref Range   Troponin I <0.30 <0.30 ng/mL  CBC with Differential  Result Value Ref Range   WBC 8.3 4.0 - 10.5 K/uL   RBC 4.41 3.87 - 5.11 MIL/uL   Hemoglobin 12.4 12.0 - 15.0 g/dL   HCT 35.4 (L) 36.0 - 46.0 %   MCV 80.3 78.0 - 100.0 fL   MCH 28.1 26.0 - 34.0 pg   MCHC 35.0 30.0 - 36.0 g/dL   RDW 13.8 11.5 - 15.5 %   Platelets 306 150 - 400 K/uL   Neutrophils Relative % 69 43 - 77 %   Neutro Abs 5.7 1.7 - 7.7 K/uL   Lymphocytes Relative 20 12 - 46 %   Lymphs Abs 1.7 0.7 - 4.0 K/uL   Monocytes Relative 9 3 - 12 %   Monocytes Absolute 0.8 0.1 - 1.0 K/uL   Eosinophils Relative 2 0 - 5 %   Eosinophils Absolute 0.1 0.0 - 0.7 K/uL   Basophils Relative 0 0 - 1 %   Basophils Absolute 0.0 0.0 - 0.1 K/uL   Dg Chest 2 View 11/21/2014   CLINICAL DATA:  Shortness of Breath  EXAM: CHEST  2 VIEW  COMPARISON:  10/15/2014  FINDINGS: Cardiomegaly is noted. Status post CABG. Limited study by poor inspiration and patient's large body habitus. No acute infiltrate or pulmonary edema. Mild degenerative changes and osteopenia thoracic spine. Atherosclerotic calcifications of thoracic aorta.  IMPRESSION: Status post CABG. Cardiomegaly. Limited study by poor inspiration and patient's large body habitus. No gross infiltrate or pulmonary edema.  Electronically Signed   By: Lahoma Crocker M.D.   On: 11/21/2014 17:06    1805:  BNP mildly elevated, but improved from previous.  Workup otherwise reassuring. Pt states she "feels better" after neb.  NAD, lungs CTA bilat, no wheezing, resps easy, speaking full sentences, Sats 98% R/A.  Pt ambulated around the ED with Sats remaining 94-98 % R/A, NAD.  Denies any change in her usual/chronic SOB. Pt states she wants to go home now and family would like to take her home. Will tx symptomatically at this time. Dx and testing d/w pt and family.  Questions answered.  Verb understanding, agreeable to d/c home with outpt f/u.     Francine Graven, DO 11/24/14 [id number]

## 2014-11-21 NOTE — ED Notes (Addendum)
Pt ambulated with walker. HR-82 O2-94-98. Pt states short of breath while and after walking.

## 2014-11-21 NOTE — ED Notes (Signed)
Sob that started this morning.

## 2014-11-21 NOTE — Discharge Instructions (Signed)
°Emergency Department Resource Guide °1) Find a Doctor and Pay Out of Pocket °Although you won't have to find out who is covered by your insurance plan, it is a good idea to ask around and get recommendations. You will then need to call the office and see if the doctor you have chosen will accept you as a new patient and what types of options they offer for patients who are self-pay. Some doctors offer discounts or will set up payment plans for their patients who do not have insurance, but you will need to ask so you aren't surprised when you get to your appointment. ° °2) Contact Your Local Health Department °Not all health departments have doctors that can see patients for sick visits, but many do, so it is worth a call to see if yours does. If you don't know where your local health department is, you can check in your phone book. The CDC also has a tool to help you locate your state's health department, and many state websites also have listings of all of their local health departments. ° °3) Find a Walk-in Clinic °If your illness is not likely to be very severe or complicated, you may want to try a walk in clinic. These are popping up all over the country in pharmacies, drugstores, and shopping centers. They're usually staffed by nurse practitioners or physician assistants that have been trained to treat common illnesses and complaints. They're usually fairly quick and inexpensive. However, if you have serious medical issues or chronic medical problems, these are probably not your best option. ° °No Primary Care Doctor: °- Call [facility] at  832-8000 - they can help you locate a primary care doctor that  accepts your insurance, provides certain services, etc. °- Physician Referral Service- 1-800-533-3463 ° °Chronic Pain Problems: °Organization         Address  Phone   Notes  °[facility]  (336) 297-2271 Patients need to be referred by their primary care doctor.  ° °Medication  Assistance: °Organization         Address  Phone   Notes  °[place] [facility] [address] °[place], [place] [address] (336) 641-8030 --Must be a resident of [place] °-- Must have NO insurance coverage whatsoever (no Medicaid/ Medicare, etc.) °-- The pt. MUST have a primary care doctor that directs their care regularly and follows them in the community °  °[facility]  (866) 331-1348   °[employer]  (888) 892-1162   ° °Agencies that provide inexpensive medical care: °Organization         Address  Phone   Notes  °[facility]  (336) 832-8035   °[facility]    (336) 832-7272   °[facility] [address] °[place], [place] [address] (336) 832-4777   °[facility] [address], °[place] (336) 271-4999   °[facility]    (336) 373-0678   °[facility]    (336) 272-1050   °[facility] ° [address], [place] Phone:  (336) 832-4444, Fax:  (336) 832-4440 Hours of Operation:  9 am - 6 pm, M-F.  Also accepts Medicaid/Medicare and self-pay.  °[facility] ° [address], [place] Phone: (336) 832-3150, Fax: (336) 832-3151. Hours of Operation:  8:30 am - 5:30 pm, M-F.  Also accepts Medicaid and self-pay.  °[facility] [place] [address], [place] Phone: (336) 878-6027   °[facility] [address], [place], [place] (336)723-1848, Ext. 123 Mondays & Thursdays: 7-9 AM.  First 15 patients are seen on a first come, first serve basis. °  ° °Medicaid-accepting [place] Providers: ° °Organization         Address  Phone   Notes  °[facility] [address], [place] (336) 641-2100 Also accepts self-pay patients.  °[facility] [address], [place] ° (336) 856-9996   °[facility] [address], [place]  (336) 288-8857   °[facility] [address] [place] [address], [place] (336) 299-7000   °Veita Bland [address], [place]  ° (336) 373-1557 Only accepts [place] Access Medicaid patients after they have their name applied to their card.  ° °Self-Pay (no insurance) in [place]: ° °Organization         Address  Phone   Notes  °Sickle Cell Patients, [facility] [address], [place] (336) 832-1970   °[facility] [address], [place] (336) 832-4400   ° [facility] ° [address], [place] (336) 992-4800   °[facility]/Dr. Osei-Bonsu ° [address] [place] [address], [place] or [address], [place] (336) 841-8500 Phone number for both [place] and [place] locations is the same.  °[facility] [address], [place] (336) 299-0000   °[facility] [address] [place] [address], [place] or [address] (336) 852-7530 °(336) 878-2260   °[facility] [address], [place] (336) 350-1642, phone; (336) 294-5005, fax Sees patients 1st and 3rd Saturday of every month.  Must not qualify for public or private insurance (i.e. Medicaid, Medicare, [place] Health Choice, Veterans' Benefits) • Household income should be no more than 200% of the poverty level •The clinic cannot treat you if you are pregnant or think you are pregnant • Sexually transmitted diseases are not treated at the clinic.  ° ° °Dental Care: °Organization         Address  Phone  Notes  °[place] [facility] [address], [place] (336) 641-6152 Accepts children up to age 21 who are enrolled in Medicaid or [place] Health Choice; pregnant women with a Medicaid card; and children who have applied for Medicaid or [place] Health Choice, but were declined, whose parents can pay a reduced fee at time of service.  °[place]  [facility] [place]  [address], [place] (336) 641-7733 Accepts children up to age 21 who are enrolled in Medicaid or  Health Choice; pregnant women with a Medicaid card; and children who have applied for Medicaid or  Health Choice, but were declined, whose parents can pay a reduced fee at time of service.  °[facility] ° [address],  (336) 641-4533 Patients are seen by appointment only. Walk-ins are not accepted. [facility] will see patients 18 years of age and older. °Monday - Tuesday (8am-5pm) °Most Wednesdays (8:30-5pm) °$30 per visit, cash only  °[facility] ° [address], [place] (336) 641-4533 Patients are seen by appointment only. Walk-ins are not accepted. [facility] will see patients 18 years of age and older. °One   Wednesday Evening (Monthly: Volunteer Based).  $30 per visit, cash only  °[facility]  (919) 537-3737 for adults; Children under age 4, call Graduate Pediatric Dentistry at (919) [phone]. Children aged 4-14, please call (919) [phone] to request a pediatric application. ° Dental services are provided in all areas of dental care including fillings, crowns and bridges, complete and partial dentures, implants, gum treatment, root canals, and extractions. Preventive care is also provided. Treatment is provided to both adults and children. °Patients are selected via a lottery and there is often a waiting list. °  °[facility] [address], °[place] ° (336) 763-8833 [email or web address] °  °[facility] [address], [place], [place] (336)723-1848, Ext. 123 Second and Fourth Thursday of each month, opens at 6:30 AM; Clinic ends at 9 AM.  Patients are seen on a first-come first-served basis, and a limited number are seen during each clinic.  ° °[facility] ° [address], [place], [place] (336) 723-7904    Eligibility Requirements °You must have lived in [place], or [place] counties for at least the last three months. °  You cannot be eligible for state or federal sponsored healthcare insurance, including Veterans Administration, Medicaid, or Medicare. °  You generally cannot be eligible for healthcare insurance through your employer.  °  How to apply: °Eligibility screenings are held every Tuesday and Wednesday afternoon from 1:00 pm until 4:00 pm. You do not need an appointment for the interview!  °[facility] [address], [place], [place] 336-631-2330   °[facility]  336-342-8273   °[facility]  336-703-3100   °[facility]  336-570-6415   ° °Behavioral Health Resources in the Community: °Intensive Outpatient Programs °Organization         Address  Phone  Notes  °[facility] [address], [place], [place] 336-878-6098   °[facility] [address], [place], [place] 336-832-9800   °[facility] [address], [place], [place] ° 336-882-2125   °[facility] [address],  °[place], [place] 1-800-853-5163 or 336-641-4981   °Substance Abuse Resources °Organization         Address  Phone  Notes  °[facility]  336-882-2125   °[facility]  336-784-9470   °[facility]  336-285-9073   °[facility]  336-845-3988   °Residential & Outpatient Substance Abuse Program  1-800-659-3381   °Psychological Services °Organization         Address  Phone  Notes  °[facility]  336- 832-9600   °[facility]  336- 378-7881   °[facility] [address], [place] 1-800-853-5163 or 336-641-4981   ° °Mobile Crisis Teams °Organization         Address  Phone  Notes  °[facility], Mobile Crisis Care Unit  1-877-626-1772   °Assertive °[facility] ° [address]  [place], [place] 336-834-9664   °Sharon DeEsch [address] °[place] 336-554-5454   ° °Self-Help/Support Groups °Organization         Address  Phone             Notes  °[facility]  - variety of support groups  336- 373-1402 Call for more information  °Narcotics Anonymous (NA), [facility] [address], °[place]   2 meetings at this location  ° °  Residential Treatment Programs Organization         Address  Phone  Notes  [facility] [address],    [place]  1-440-156-1166   [facility]  [address], [place] [id number], [place], [place]   [place] [place], [place] 760-072-6328 Admissions: 8am-3pm M-F  [facility] [place] [address],    [place], [place] [id number]   [facility] [address] [place], [place], [place]   [facility] [address]  [place], [place]   [facility] Intensive Outpatient [address], [place], [place]   [facility] [place]  [place], [place] 1-(218) 712-7236 or 502-043-5438   [facility] [address] [place], [place] Accepts Medicaid  [facility] [address],  [place] 1-925 201 3272 Substance Abuse/Addiction Treatment   [facility] Organization         Address  Phone  Notes  [facility]  (567)272-4805   Domenic Schwab, PhD [name] [place], [place]   815-228-7019 or 347-418-2242   [place], [place] 340-211-3701   [facility] [address] [place], [place] (606) 091-0492 Insurance/Medicaid/sponsorship through [facility] and Families [address]                                    [place], [place] (901)311-9726 [place] [address], [place] [phone]    Dr. Adele Schilder  [phone]   [facility] Dept. 1) [address], [place] 2) [place] 3)  [place] [address], [place] [phone]  [phone]   [place] 9204628164 or [phone] (After Hours)      Take the prescriptions as directed.  Use your albuterol inhaler (2 to 4 puffs) every 4 hours for the next 7 days, then as needed for cough, wheezing, or shortness of breath. Use over the counter normal saline nasal spray, as instructed in the Emergency Department, several times per day for the next 2 weeks.  Call your regular medical doctor Monday morning to schedule a follow up appointment within the next 3 days.  Return to the Emergency Department immediately sooner if worsening.

## 2014-11-24 DIAGNOSIS — J206 Acute bronchitis due to rhinovirus: Secondary | ICD-10-CM | POA: Diagnosis not present

## 2014-12-02 ENCOUNTER — Ambulatory Visit (INDEPENDENT_AMBULATORY_CARE_PROVIDER_SITE_OTHER): Payer: Medicare Other

## 2014-12-02 DIAGNOSIS — B351 Tinea unguium: Secondary | ICD-10-CM | POA: Diagnosis not present

## 2014-12-02 DIAGNOSIS — M79673 Pain in unspecified foot: Secondary | ICD-10-CM

## 2014-12-02 DIAGNOSIS — E1140 Type 2 diabetes mellitus with diabetic neuropathy, unspecified: Secondary | ICD-10-CM | POA: Diagnosis not present

## 2014-12-02 NOTE — Progress Notes (Signed)
   Subjective:    Patient ID: Katherine [name], female    DOB: [birth year], 80 y.o.   MRN: [id number]  HPI  Pt presents for nail debridement  Review of Systems no new findings or systemic changes noted    Objective:   Physical Exam Vascular status is intact DP +2 PT plus one over 4 Refill time 3 seconds there is significant edema patient did order diabetic shoes however they did not 50 the edema a Karle Starch she does not have adequate that we'll reorder extra-depth type shoe with a half inch depth consider apex type BIII thousand shoe or a stretch shoe. Has notable HAV deformity digital contractures nails thick brittle [place] friable dystrophic brittle and tender 1 through 5 bilateral debrided at this time. Secondary infections no other new changes or findings noted there is absence of epicritic sensation confirmed on Semmes Weinstein       Assessment & Plan:  Assessment diabetes Occasions and peripheral neuropathy multiple mycotic painful nails 1 through 5 bilateral debrided at this time shoes that were delivered the insoles fit and contour however the shoes do not accommodate her foot due to the edema we'll reorder a new shoe patient be contacted when shoes ready for fitting and dispensing within the next week or 2. Follow-up in 3 months for continued palliative diabetic foot and nail care is needed  Harriet Masson DPM

## 2014-12-19 ENCOUNTER — Ambulatory Visit (HOSPITAL_COMMUNITY): Payer: Medicare Other | Attending: Urology

## 2014-12-19 ENCOUNTER — Other Ambulatory Visit (HOSPITAL_COMMUNITY): Payer: Self-pay | Admitting: Urology

## 2014-12-19 DIAGNOSIS — N2889 Other specified disorders of kidney and ureter: Secondary | ICD-10-CM

## 2014-12-30 DIAGNOSIS — E119 Type 2 diabetes mellitus without complications: Secondary | ICD-10-CM | POA: Diagnosis not present

## 2014-12-30 DIAGNOSIS — I10 Essential (primary) hypertension: Secondary | ICD-10-CM | POA: Diagnosis not present

## 2014-12-31 ENCOUNTER — Ambulatory Visit (HOSPITAL_COMMUNITY): Payer: Medicare Other

## 2015-01-01 DIAGNOSIS — I5020 Unspecified systolic (congestive) heart failure: Secondary | ICD-10-CM | POA: Diagnosis not present

## 2015-01-01 DIAGNOSIS — E119 Type 2 diabetes mellitus without complications: Secondary | ICD-10-CM | POA: Diagnosis not present

## 2015-01-01 DIAGNOSIS — E782 Mixed hyperlipidemia: Secondary | ICD-10-CM | POA: Diagnosis not present

## 2015-01-01 DIAGNOSIS — R809 Proteinuria, unspecified: Secondary | ICD-10-CM | POA: Diagnosis not present

## 2015-01-02 ENCOUNTER — Encounter: Payer: Self-pay | Admitting: Internal Medicine

## 2015-01-06 ENCOUNTER — Ambulatory Visit (HOSPITAL_COMMUNITY)
Admission: RE | Admit: 2015-01-06 | Discharge: 2015-01-06 | Disposition: A | Discharge status: Home / Self Care | Payer: Medicare Other | Source: Ambulatory Visit | Attending: Urology | Admitting: Urology

## 2015-01-06 DIAGNOSIS — D495 Neoplasm of unspecified behavior of other genitourinary organs: Secondary | ICD-10-CM | POA: Diagnosis not present

## 2015-01-06 DIAGNOSIS — N2889 Other specified disorders of kidney and ureter: Secondary | ICD-10-CM

## 2015-01-06 DIAGNOSIS — N281 Cyst of kidney, acquired: Secondary | ICD-10-CM | POA: Insufficient documentation

## 2015-01-06 DIAGNOSIS — R0602 Shortness of breath: Secondary | ICD-10-CM | POA: Diagnosis not present

## 2015-01-06 DIAGNOSIS — N289 Disorder of kidney and ureter, unspecified: Secondary | ICD-10-CM | POA: Diagnosis present

## 2015-01-06 DIAGNOSIS — N200 Calculus of kidney: Secondary | ICD-10-CM | POA: Diagnosis not present

## 2015-01-06 DIAGNOSIS — I517 Cardiomegaly: Secondary | ICD-10-CM | POA: Insufficient documentation

## 2015-01-13 ENCOUNTER — Ambulatory Visit: Payer: Medicare Other

## 2015-01-20 ENCOUNTER — Ambulatory Visit: Payer: Medicare Other

## 2015-01-26 DIAGNOSIS — D495 Neoplasm of unspecified behavior of other genitourinary organs: Secondary | ICD-10-CM | POA: Diagnosis not present

## 2015-01-27 ENCOUNTER — Ambulatory Visit (INDEPENDENT_AMBULATORY_CARE_PROVIDER_SITE_OTHER): Payer: Medicare Other

## 2015-01-27 VITALS — BP 156/89 | HR 72 | Resp 12

## 2015-01-27 DIAGNOSIS — E1140 Type 2 diabetes mellitus with diabetic neuropathy, unspecified: Secondary | ICD-10-CM

## 2015-01-27 DIAGNOSIS — M79673 Pain in unspecified foot: Secondary | ICD-10-CM

## 2015-01-27 DIAGNOSIS — M79676 Pain in unspecified toe(s): Secondary | ICD-10-CM

## 2015-01-27 DIAGNOSIS — B351 Tinea unguium: Secondary | ICD-10-CM

## 2015-01-27 NOTE — Progress Notes (Signed)
   Subjective:    Patient ID: Katherine [name], female    DOB: 1928/01/19, 81 y.o.   MRN: [id number]  HPI PUD AND GIVEN INSTRUCTION.   Review of Systems no new findings or systemic changes noted patient has significant edema both feet     Objective:   Physical Exam  Neurovascular status intact on trial the shoes did not fit appropriate patient's edema increase the left foot volume patient's shoes will be reordered from an 10 wide to 10 extrawide width should accommodate her swelling and edema.      Assessment & Plan:  No charges today patient will be called when diabetic shoes available for fitting and dispensing reordered appropriate size at this time.  Harriet Masson DPM

## 2015-02-17 DIAGNOSIS — I509 Heart failure, unspecified: Secondary | ICD-10-CM | POA: Diagnosis not present

## 2015-02-17 DIAGNOSIS — Z23 Encounter for immunization: Secondary | ICD-10-CM | POA: Diagnosis not present

## 2015-02-17 DIAGNOSIS — E119 Type 2 diabetes mellitus without complications: Secondary | ICD-10-CM | POA: Diagnosis not present

## 2015-02-17 DIAGNOSIS — F0390 Unspecified dementia without behavioral disturbance: Secondary | ICD-10-CM | POA: Diagnosis not present

## 2015-02-18 ENCOUNTER — Encounter: Payer: Self-pay | Admitting: Internal Medicine

## 2015-02-18 ENCOUNTER — Ambulatory Visit (INDEPENDENT_AMBULATORY_CARE_PROVIDER_SITE_OTHER): Payer: Medicare Other | Admitting: Internal Medicine

## 2015-02-18 VITALS — BP 164/68 | HR 90 | Ht 62.0 in | Wt 234.9 lb

## 2015-02-18 DIAGNOSIS — I5030 Unspecified diastolic (congestive) heart failure: Secondary | ICD-10-CM | POA: Diagnosis not present

## 2015-02-18 DIAGNOSIS — Z951 Presence of aortocoronary bypass graft: Secondary | ICD-10-CM | POA: Diagnosis not present

## 2015-02-18 DIAGNOSIS — I350 Nonrheumatic aortic (valve) stenosis: Secondary | ICD-10-CM | POA: Diagnosis not present

## 2015-02-18 NOTE — Patient Instructions (Signed)
Your physician wants you to follow-up in: 6 months with Dr. Debara Pickett. You will receive a reminder letter in the mail two months in advance. If you don't receive a letter, please call our office to schedule the follow-up appointment.  Please have your echocardiogram scheduled - we will call with the results.

## 2015-02-20 DIAGNOSIS — R809 Proteinuria, unspecified: Secondary | ICD-10-CM | POA: Diagnosis not present

## 2015-02-20 NOTE — Progress Notes (Signed)
OFFICE NOTE  Chief Complaint:  Edema  Primary Care Physician: Delphina Cahill, MD  HPI:  Katherine TOBEY is an 81 year old female with a history of coronary disease status post CABG in 2003, morbid obesity, hypertension, dyslipidemia, and diabetes type 2. She had valvular aortic stenosis with a sign of increased gradient on her last echo in November. We repeated that echo on October 22, 2012 which showed actually no significant change in her aortic valve gradient with a valve area of about 1.2 cm, and peak and mean gradients of 45 and 24 mmHg. This is similar to findings in 2012. I suspect that we may have had an incomplete envelop and that her true gradients are closer to 50 and 30 mmHg. However, this would still yield moderate to severe aortic stenosis. Overall though she is asymptomatic at this point and there is no clear recommendation for surgery or procedures at this time. He reports that recently she had a urinary tract infection which is associated with some confusion. Ultimately this is treated and she rebounded quite quickly. She denies any chest pain or worsening shortness of breath. She has had no syncopal episodes. She is stated in the past but she is not interested in a repeat open heart surgical procedure, nor do I think she is a good candidate for that.  She returns today for followup with a repeat echo. This shows fairly stable and gradients and again moderate to severe aortic stenosis with a valve area of about 1-1.1 cm. She has reported a slight increase in lower extremity swelling but also for the past 2 weeks has awakened in the morning with chest pain. The chest discomfort is located over the left anterior chest wall and is somewhat worse with movement and position. It is not necessarily associated with exertion, however she no longer has to walk up stairs as she moved her washing machine and dryer up to the first floor. Her last stress test was in 2011 and was  nonischemic.  Ms. Karpinski returns today for follow-up. She was recently seen by Cecilie Kicks, FNP, for lower extremity swelling and shortness of breath. She had her Lasix dose increased to 40 mg daily. A chest x-ray was performed which shows cardiomegaly without overt heart failure. BNP was elevated at 655.  A repeat echocardiogram was performed which shows an EF of 65-70%. There is now moderate to more likely severe aortic stenosis with a mean gradient of 34 mmHg and a peak gradient of 59 mmHg. She reports today that there is been mild improvement in her shortness of breath however her weight remains fairly stable. Her leg edema has improved somewhat.  Saachi returns today for follow-up. She reports her swelling is improved significantly. Unfortunate she's developed a chest cold. She was advised to use Mucinex to loosen it up. She has had aggressive shortness of breath and easy fatigability. As mentioned above she is close to severe aortic stenosis. We discussed several options including TAVR today, which I think would be the best alternative for her. I would like to repeat her echocardiogram in 5-6 months and see her back at that time. If her mean gradient has increased some, I will refer her to the valve clinic.   I had the pleasure seeing Ms. Holderness back in the office today for follow-up. She was supposed to have an echocardiogram which we were going to review, however that has not yet been performed. She reports stable breathlessness but no chest pain. She's  had no syncope. Unfortunately she's gained more weight.  PMHx:  Past Medical History  Diagnosis Date  . Gastric mass   . Hernia of unspecified site of abdominal cavity without mention of obstruction or gangrene     hiatal  . Gastric ulcer   . Thyroid mass   . GERD (gastroesophageal reflux disease)   . Anxiety   . Coronary artery disease   . Osteoarthritis   . Anemia   . Hypertension   . Hyperlipidemia   . Diabetes mellitus,  type 2   . Aortic valvular stenosis     moderate to severe  EF on 2D Echo 10/22/2012 65-70%  valve area of 1.2cm, and peak and mean gradients of 45 and 47mmHg  . CHF (congestive heart failure)   . Dyspnea     chronic  . Easy fatigability   . Normal cardiac stress test 12/2013    normal stress nuclear study    Past Surgical History  Procedure Laterality Date  . Coronary artery bypass graft      x4 in 2003  . Tubal ligation    . Knee surgery    . Uterine polypectomy    . Internal hemorrhoidectomy      FAMHx:  Family History  Problem Relation Age of Onset  . Colon cancer Neg Hx   . Diabetes Mother   . Diabetes Father   . Heart disease Mother   . Stroke Father   . Stroke Mother   . Heart attack Brother     SOCHx:   reports that she has never smoked. She has never used smokeless tobacco. She reports that she does not drink alcohol or use illicit drugs.  ALLERGIES:  No Known Allergies  ROS: A comprehensive review of systems was negative except for: Respiratory: positive for cough and dyspnea on exertion Cardiovascular: positive for lower extremity edema  HOME MEDS: Current Outpatient Prescriptions  Medication Sig Dispense Refill  . acetaminophen (TYLENOL) 500 MG tablet Take 1,000 mg by mouth 2 (two) times daily. Take 1000 mg by mouth in the morning and take 1000 mg by mouth in the evening.    Marland Kitchen albuterol (PROVENTIL HFA;VENTOLIN HFA) 108 (90 BASE) MCG/ACT inhaler Inhale 2 puffs into the lungs every 4 (four) hours as needed for wheezing or shortness of breath. 1 Inhaler 0  . ALPRAZolam (XANAX) 0.25 MG tablet Take 0.125-0.25 tablets by mouth 2 (two) times daily as needed for anxiety or sleep.     . fexofenadine (ALLEGRA) 180 MG tablet Take 180 mg by mouth daily.    . furosemide (LASIX) 20 MG tablet Take 2 tablets daily (Patient taking differently: Take 40 mg by mouth daily. ) 60 tablet 6  . HYDROcodone-acetaminophen (NORCO) 5-325 MG per tablet Take 1 tablet by mouth daily as  needed for moderate pain.     Marland Kitchen insulin detemir (LEVEMIR FLEXPEN) 100 UNIT/ML injection Inject 48 Units into the skin 2 (two) times daily.     . Ipratropium-Albuterol (COMBIVENT RESPIMAT) 20-100 MCG/ACT AERS respimat Inhale 2 puffs into the lungs every 6 (six) hours as needed for wheezing or shortness of breath. 1 Inhaler 2  . Linaclotide (LINZESS) 145 MCG CAPS capsule Take 145 mcg by mouth daily as needed (for constipation).    Marland Kitchen lisinopril (PRINIVIL,ZESTRIL) 10 MG tablet Take 1 tablet (10 mg total) by mouth daily. 30 tablet 6  . loperamide (IMODIUM) 2 MG capsule Take 2 mg by mouth as needed for diarrhea or loose stools.    Marland Kitchen  meloxicam (MOBIC) 7.5 MG tablet Take 7.5 mg by mouth daily.    . Memantine HCl ER (NAMENDA XR) 28 MG CP24 Take 28 mg by mouth every morning.    . metFORMIN (GLUCOPHAGE-XR) 500 MG 24 hr tablet Take 500 mg by mouth 2 (two) times daily.    . nebivolol (BYSTOLIC) 10 MG tablet Take 10 mg by mouth every morning.    Marland Kitchen NEXIUM 20 MG capsule Take 20 mg by mouth daily.     . NON FORMULARY Apply 1-2 application topically 4 (four) times daily. DIC 3 % / BAC 2 % / GAB 5 % / LID-PRIL 2.5 % - 2.5 % / MENT 1 %    . nystatin (MYCOSTATIN) 100000 UNIT/ML suspension Take 5 mLs (500,000 Units total) by mouth 4 (four) times daily. Swish and spit. Use for the next 7 days. 150 mL 0  . potassium chloride SA (K-DUR,KLOR-CON) 20 MEQ tablet Take 20 mEq by mouth daily.    . rosuvastatin (CRESTOR) 20 MG tablet Take 20 mg by mouth at bedtime.     . VOLTAREN 1 % GEL Apply 2 g topically daily as needed (pain).      Current Facility-Administered Medications  Medication Dose Route Frequency Provider Last Rate Last Dose  . 0.9 %  sodium chloride infusion  500 mL Intravenous Continuous Lafayette Dragon, MD        LABS/IMAGING: No results found for this or any previous visit (from the past 48 hour(s)). No results found.  VITALS: BP 164/68 mmHg  Pulse 90  Ht 5\' 2"  (1.575 m)  Wt 234 lb 14.4 oz (106.55 kg)   BMI 42.95 kg/m2  EXAM: GEN: Awake, NAD HEENT: PERRLA, EOMI Neck: Thick neck, unable to assess JVP Lungs: Decreased breath sounds bilaterally, no rales, rhonchorous cough  Cardiovascular: Regular rate and rhythm, normal S1, no clear S2, 3/6 high-pitched, late onset musical systolic ejection murmur at the right upper sternal border Abdomen: Morbid obese, soft, nontender Extremity: Trace to 1+ bilateral pitting edema Neurologic: Grossly nonfocal Psych: Pleasant mood, normal affect  EKG: Normal sinus rhythm at 90, voltage criteria for LVH  ASSESSMENT: 1. Chest pain - resolved, low risk NST 01/2014 2. Moderate to more likely severe aortic stenosis - Mean gradient 34 mmHg, peak gradient 59 mmHg 3. Morbid obesity 4. History of coronary artery disease status post CABG in 2003 5. Hypertension - repeat BP was 162/80 6. Dyslipidemia 7. Diabetes type 2  PLAN: 1.   Ms. Dobias continues to have shortness of breath. She is due for repeat echo however her mean gradient was close to 40. I would recommend another echocardiogram at this time to see if her aortic stenosis is worsened. I will likely refer her to the aortic valve clinic as she is not likely to be a good candidate for open aortic valve replacement. She seems to be interested in TAVR, if it is indicated.  [name], MD, [facility] Attending Cardiologist [facility]  Irish Breisch C 02/20/2015, 5:34 PM

## 2015-03-03 ENCOUNTER — Ambulatory Visit: Payer: Medicare Other

## 2015-03-03 ENCOUNTER — Ambulatory Visit (HOSPITAL_COMMUNITY)
Admission: RE | Admit: 2015-03-03 | Discharge: 2015-03-03 | Disposition: A | Discharge status: Home / Self Care | Payer: Medicare Other | Source: Ambulatory Visit | Attending: Cardiology | Admitting: Cardiology

## 2015-03-03 DIAGNOSIS — E785 Hyperlipidemia, unspecified: Secondary | ICD-10-CM | POA: Insufficient documentation

## 2015-03-03 DIAGNOSIS — E119 Type 2 diabetes mellitus without complications: Secondary | ICD-10-CM | POA: Diagnosis not present

## 2015-03-03 DIAGNOSIS — I10 Essential (primary) hypertension: Secondary | ICD-10-CM | POA: Diagnosis not present

## 2015-03-03 DIAGNOSIS — I350 Nonrheumatic aortic (valve) stenosis: Secondary | ICD-10-CM | POA: Insufficient documentation

## 2015-03-03 NOTE — Progress Notes (Addendum)
2D Echocardiogram Complete.  03/03/2015   Katherine [name], RDCS   Preliminary Technician Findings:  Possible trivial effusion (vs. Fat Pad) anterior to the heart, causing no hemodynamic compromise.  The Aortic Stenosis is seen prior, and appears unchanged.

## 2015-03-05 ENCOUNTER — Telehealth: Payer: Self-pay | Admitting: Internal Medicine

## 2015-03-05 NOTE — Telephone Encounter (Signed)
She would like to know if the patient's echo results are ready?

## 2015-03-05 NOTE — Telephone Encounter (Signed)
INFORMED DAUGHTER RESULT ARE NOT AVAILABLE YET WILL CONTACT HER ONCE AVAILABLE SHE VOICED UNDERSTANDING.

## 2015-03-09 ENCOUNTER — Telehealth: Payer: Self-pay | Admitting: Internal Medicine

## 2015-03-09 NOTE — Telephone Encounter (Signed)
Pt 's daughter called in stating that her mother had an Echo done on 3/29 and she would like to know what her results are and what Dr. Lysbeth Penner recommendations are moving forward. Please call back  Thanks

## 2015-03-09 NOTE — Telephone Encounter (Signed)
Spoke with daughter, who got patient on the phone, and communicated the results to patient/daughter. They are in agreement with plan to see Dr. Burt Knack for valve replacement. Will notify scheduler to set up appointment.

## 2015-03-10 ENCOUNTER — Ambulatory Visit: Payer: Medicare Other

## 2015-03-10 ENCOUNTER — Encounter: Payer: Self-pay | Admitting: Podiatry

## 2015-03-10 ENCOUNTER — Ambulatory Visit (INDEPENDENT_AMBULATORY_CARE_PROVIDER_SITE_OTHER): Payer: Medicare Other | Admitting: Podiatry

## 2015-03-10 DIAGNOSIS — L03039 Cellulitis of unspecified toe: Secondary | ICD-10-CM

## 2015-03-10 DIAGNOSIS — I2510 Atherosclerotic heart disease of native coronary artery without angina pectoris: Secondary | ICD-10-CM | POA: Diagnosis not present

## 2015-03-10 DIAGNOSIS — M79676 Pain in unspecified toe(s): Secondary | ICD-10-CM

## 2015-03-10 DIAGNOSIS — M79673 Pain in unspecified foot: Secondary | ICD-10-CM

## 2015-03-10 DIAGNOSIS — B351 Tinea unguium: Secondary | ICD-10-CM

## 2015-03-10 DIAGNOSIS — E1140 Type 2 diabetes mellitus with diabetic neuropathy, unspecified: Secondary | ICD-10-CM

## 2015-03-11 NOTE — Progress Notes (Signed)
Subjective:     Patient ID: Katherine [name], female   DOB: 1928-03-29, 81 y.o.   MRN: [id number]  HPI patient presents and significant report health with long-term diabetes and structural foot issues with neurological and vascular diminishment noted. Also has nail disease 1-5 both feet with thickness   Review of Systems     Objective:   Physical Exam Neurovascular status diminished but unchanged with thick yellow brittle nailbeds 1-5 both feet structural imbalances of the feet and pre-diabetic type risk as far as ulceration    Assessment:     At risk diabetic with structural deformity and thick mycotic nail infection 1-5 both feet    Plan:     Debridement nailbeds 1-5 both feet with no bleeding noted and dispensed diabetic shoes which fit well

## 2015-03-17 ENCOUNTER — Ambulatory Visit (INDEPENDENT_AMBULATORY_CARE_PROVIDER_SITE_OTHER): Payer: Medicare Other | Admitting: Cardiovascular Disease

## 2015-03-17 ENCOUNTER — Encounter: Payer: Self-pay | Admitting: *Deleted

## 2015-03-17 ENCOUNTER — Encounter: Payer: Self-pay | Admitting: Cardiovascular Disease

## 2015-03-17 ENCOUNTER — Ambulatory Visit: Payer: Medicare Other

## 2015-03-17 VITALS — BP 124/74 | HR 74 | Ht 62.0 in | Wt 234.0 lb

## 2015-03-17 DIAGNOSIS — I350 Nonrheumatic aortic (valve) stenosis: Secondary | ICD-10-CM | POA: Diagnosis not present

## 2015-03-17 LAB — BASIC METABOLIC PANEL
BUN: 23 mg/dL (ref 6–23)
CO2: 27 mEq/L (ref 19–32)
CREATININE: 1.03 mg/dL (ref 0.40–1.20)
Calcium: 11.1 mg/dL — ABNORMAL HIGH (ref 8.4–10.5)
Chloride: 104 mEq/L (ref 96–112)
GFR: 65.47 mL/min (ref >60.00)
Glucose, Bld: 136 mg/dL — ABNORMAL HIGH (ref 70–99)
POTASSIUM: 4.4 meq/L (ref 3.5–5.1)
Sodium: 138 mEq/L (ref 135–145)

## 2015-03-17 LAB — CBC
HCT: 41.5 % (ref 36.0–46.0)
HEMOGLOBIN: 14 g/dL (ref 12.0–15.0)
MCHC: 33.7 g/dL (ref 30.0–36.0)
MCV: 82.9 fl (ref 78.0–100.0)
PLATELETS: 305 10*3/uL (ref 150.0–400.0)
RBC: 5 Mil/uL (ref 3.87–5.11)
RDW: 14.2 % (ref 11.5–15.5)
WBC: 9.1 10*3/uL (ref 4.0–10.5)

## 2015-03-17 LAB — PROTIME-INR
INR: 1 ratio (ref 0.8–1.0)
Prothrombin Time: 11.3 s (ref 9.6–13.1)

## 2015-03-17 NOTE — Progress Notes (Addendum)
Multi-Disciplinary Valve Clinic Note  Chief Complaint  Katherine [name] presents with  . Shortness of Breath    History of Present Illness: 81 yo female with history of severe aortic valve stenosis, CAD s/p 4V CABG in 2003, DM, HTN, hyperlipidemia, chronic diastolic CHF, obesity who is referred to the valve clinic today for discussion regarding her severe aortic valve stenosis. She is followed by Dr. Debara Pickett. She has known CAD and underwent 4 vessel CABG in 2003. Last cath was before her bypass. Normal stress myoview January 2015. She has diabetes managed with insulin and oral therapy. She has been followed for the last few years by Dr. Debara Pickett for moderate aortic valve stenosis. She was last seen in the office by Dr. Debara Pickett on 02/20/15 and c/o more shortness of breath. She has chronic diastolic heart failure with chronic lower extremity edema that has been managed effectively with Lasix. Echo 03/03/15 with normal LV systolic function, [id number]. There was mild concentric hypertrophy and grade 1 diastolic dysfunction. The aortic valve is heavily calcified with poor leaflet excursion. Mean gradient across the aortic valve was 37 mmHg and peak gradient was 62 mm Hg. Aortic valve area estimated at 1.46 cm2.   She tells me today that she has had a big change in her ability to perform normal activities of daily living. She is limited by dyspnea with minimal exertion and fatigue. She has no energy. She rare chest pains. She has no exertional chest pain. She walks with a cane. She denies orthopnea or PND. She has mild lower extremity edema. Weight has been overall stable for the last 4 months. She uses Lasix every day. 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    Primary Care Physician: Delphina Cahill Primary Cardiologist: Dr. Debara Pickett   Past Medical History  Diagnosis Date  . Gastric mass   . Hernia of unspecified site of abdominal cavity without mention of obstruction or gangrene     hiatal  . Gastric ulcer   . Thyroid  mass   . GERD (gastroesophageal reflux disease)   . Anxiety   . Coronary artery disease   . Osteoarthritis   . Anemia   . Hypertension   . Hyperlipidemia   . Diabetes mellitus, type 2   . Aortic valvular stenosis     moderate to severe  EF on 2D Echo 10/22/2012 65-70%  valve area of 1.2cm, and peak and mean gradients of 45 and 34mmHg  . CHF (congestive heart failure)   . Dyspnea     chronic  . Easy fatigability   . Normal cardiac stress test 12/2013    normal stress nuclear study    Past Surgical History  Procedure Laterality Date  . Coronary artery bypass graft      x4 in 2003  . Tubal ligation    . Knee surgery Right   . Uterine polypectomy    . Internal hemorrhoidectomy    . Cataract extraction Bilateral     Current outpatient prescriptions:  .  acetaminophen (TYLENOL) 500 MG tablet, Take 1,000 mg by mouth 2 (two) times daily. Take 1000 mg by mouth in the morning and take 1000 mg by mouth in the evening., Disp: , Rfl:  .  ALPRAZolam (XANAX) 0.25 MG tablet, Take 0.125-0.25 tablets by mouth 2 (two) times daily as needed for anxiety or sleep. , Disp: , Rfl:  .  fexofenadine (ALLEGRA) 180 MG tablet, Take 180 mg by mouth daily., Disp: , Rfl:  .  furosemide (LASIX) 20  MG tablet, Take 2 tablets daily (Katherine [name] taking differently: Take 40 mg by mouth daily. ), Disp: 60 tablet, Rfl: 6 .  insulin detemir (LEVEMIR FLEXPEN) 100 UNIT/ML injection, Inject 48 Units into the skin 2 (two) times daily. , Disp: , Rfl:  .  Ipratropium-Albuterol (COMBIVENT RESPIMAT) 20-100 MCG/ACT AERS respimat, Inhale 2 puffs into the lungs every 6 (six) hours as needed for wheezing or shortness of breath., Disp: 1 Inhaler, Rfl: 2 .  Linaclotide (LINZESS) 145 MCG CAPS capsule, Take 145 mcg by mouth daily as needed (for constipation)., Disp: , Rfl:  .  meloxicam (MOBIC) 7.5 MG tablet, Take 7.5 mg by mouth daily., Disp: , Rfl:  .  Memantine HCl ER (NAMENDA XR) 28 MG CP24, Take 28 mg by mouth every morning., Disp: ,  Rfl:  .  metFORMIN (GLUCOPHAGE-XR) 500 MG 24 hr tablet, Take 500 mg by mouth 2 (two) times daily., Disp: , Rfl:  .  nebivolol (BYSTOLIC) 10 MG tablet, Take 10 mg by mouth every morning., Disp: , Rfl:  .  NEXIUM 20 MG capsule, Take 20 mg by mouth daily. , Disp: , Rfl:  .  NON FORMULARY, Apply 1-2 application topically 4 (four) times daily. DIC 3 % / BAC 2 % / GAB 5 % / LID-PRIL 2.5 % - 2.5 % / MENT 1 %, Disp: , Rfl:  .  potassium chloride SA (K-DUR,KLOR-CON) 20 MEQ tablet, Take 20 mEq by mouth daily., Disp: , Rfl:  .  rosuvastatin (CRESTOR) 20 MG tablet, Take 20 mg by mouth at bedtime. , Disp: , Rfl:  .  VOLTAREN 1 % GEL, Apply 2 g topically daily as needed (pain). , Disp: , Rfl:  .  HYDROcodone-acetaminophen (NORCO) 5-325 MG per tablet, Take 1 tablet by mouth daily as needed for moderate pain. , Disp: , Rfl:  .  loperamide (IMODIUM) 2 MG capsule, Take 2 mg by mouth as needed for diarrhea or loose stools., Disp: , Rfl:   Current facility-administered medications:  .  0.9 %  sodium chloride infusion, 500 mL, Intravenous, Continuous, Lafayette Dragon, MD  No Known Allergies  History   Social History  . Marital Status: Widowed    Spouse Name: N/A  . Number of Children: 5  . Years of Education: N/A   Occupational History  . Health and safety inspector and CNA    Social History Main Topics  . Smoking status: Never Smoker   . Smokeless tobacco: Never Used  . Alcohol Use: No  . Drug Use: No  . Sexual Activity: Not on file   Other Topics Concern  . Not on file   Social History Narrative    Family History  Problem Relation Age of Onset  . Colon cancer Neg Hx   . Diabetes Mother   . Heart disease Mother   . Stroke Father   . Stroke Mother   . Heart attack Brother   . Cerebral aneurysm Mother     Review of Systems:  As stated in the HPI and otherwise negative.   BP 124/74 mmHg  Pulse 74  Ht 5\' 2"  (1.575 m)  Wt 234 lb (106.142 kg)  BMI 42.79 kg/m2  SpO2 94%  Physical  Examination: General: Well developed, well nourished, NAD HEENT: OP clear, mucus membranes moist SKIN: warm, dry. No rashes. Neuro: No focal deficits Musculoskeletal: Muscle strength 5/5 all ext Psychiatric: Mood and affect normal Neck: No JVD, no carotid bruits, no thyromegaly, no lymphadenopathy. Lungs:Clear bilaterally, no wheezes, rhonci, crackles Cardiovascular: Regular  rate and rhythm. Loud harsh systolic murmur noted. No gallops or rubs. Abdomen:Soft. Bowel sounds present. Non-tender.  Extremities: No lower extremity edema. Pulses are 2 + in the bilateral DP/PT.  Echo 03/03/15: Left ventricle: The cavity size was normal. There was mild concentric hypertrophy. Systolic function was vigorous. The estimated ejection fraction was in the range of 65% to 70%. Wall motion was normal; there were no regional wall motion abnormalities. Doppler parameters are consistent with abnormal left ventricular relaxation (grade 1 diastolic dysfunction). - Aortic valve: Cusp separation was reduced. There was moderate to severe stenosis. Peak velocity (S): 393 cm/s. Mean gradient (S): 37 mm Hg. - Mitral valve: Calcified annulus. Valve area by continuity equation (using LVOT flow): 3.55 cm^2. - Left atrium: The atrium was mildly dilated. Impressions: - Compared to the prior study, there has been no significant interval change.  EKG:  EKG is not ordered today. The ekg ordered today demonstrates  Recent Labs: 09/07/2014: ALT 12 11/21/2014: BUN 16; Creatinine 0.86; Hemoglobin 12.4; Platelets 306; Potassium 4.3; Pro B Natriuretic peptide (BNP) 555.2*; Sodium 135*   Lipid Panel No results found for: CHOL, TRIG, HDL, CHOLHDL, VLDL, LDLCALC, LDLDIRECT   Wt Readings from Last 3 Encounters:  03/17/15 234 lb (106.142 kg)  02/18/15 234 lb 14.4 oz (106.55 kg)  11/21/14 230 lb (104.327 kg)     Other studies Reviewed: Additional studies/ records that were reviewed today include:  Echocardiogram and stress test report, office notes from Dr Debara Pickett Review of the above records demonstrates: Severe AS  STS Risk Score Risk of Mortality: 5.768%  Morbidity or Mortality: 29.913%  Long Length of Stay: 17.651%  Short Length of Stay: 10.294%  Permanent Stroke: 3.084%  Prolonged Ventilation: 23.36%  DSW Infection: 0.391%  Renal Failure: 12.59%  Reoperation: 9.748%   Assessment and Plan:   1. Severe aortic valve stenosis: She has stage D severe symptomatic aortic stenosis. I have reviewed her echo personally and the valve is heavily calcified with poor leaflet mobility. Mean and peak gradients are elevated across the valve indicating severe aortic stenosis. She has had a fast progression of dyspnea over the last 6 months with fatigue. I think this is explained by her valve disease. She does have prior coronary revascularization with CABG in 2003 by Dr. Servando Snare. She would benefit from aortic valve replacement. I do not think she will be a good candidate for traditional open AVR given her advanced age, obesity, immobility and prior open chest surgery.Her STS risk score shows predicted mortality of 5.77% with open AVR. I think she would be a good candidate for TAVR. I have reviewed this in detail today with the Katherine [name] and her daughter including the etiology of aortic valve disease. I have shown models of the heart, aortic valve and TAVR model. They would like to proceed with planning for TAVR. I will arrange a right and left heart cath on 03/24/15 at [facility]. Pre-cath labs today. I will discuss her case with our TAVR team and arrange f/u with Dr. Servando Snare for surgical opinion. If he agrees that she would be best treated with TAVR approach, she will then need referral to see Dr. Roxy Manns or Dr. Cyndia Bent and cardiac CT, CTA chest/abd/pelvis. She will also need PFTS.   Current medicines are reviewed at length with the Katherine [name] today.  The Katherine [name] does not have concerns regarding medicines.  The  following changes have been made:  no change  Labs/ tests ordered today include:  No orders of the defined types were  placed in this encounter.    Disposition:   FU with me for cardiac cath.   Signed, Lauree Chandler, MD 03/17/2015 1:45 PM    [facility] [address] [place], [place], [place]  [address] Phone: [phone]; Fax: [phone]

## 2015-03-17 NOTE — Patient Instructions (Signed)
Medication Instructions:   Your physician recommends that you continue on your current medications as directed. Please refer to the Current Medication list given to you today.   Labwork: CBC, BMP and PT to be done today  Testing/Procedures:  Your physician has requested that you have a cardiac catheterization. Cardiac catheterization is used to diagnose and/or treat various heart conditions. Doctors may recommend this procedure for a number of different reasons. The most common reason is to evaluate chest pain. Chest pain can be a symptom of coronary artery disease (CAD), and cardiac catheterization can show whether plaque is narrowing or blocking your heart's arteries. This procedure is also used to evaluate the valves, as well as measure the blood flow and oxygen levels in different parts of your heart. For further information please visit [email or web address]. Please follow instruction sheet, as given.Scheduled for March 24, 2015    Follow-Up: To be arranged after catheterization   Coronary Angiogram A coronary angiogram, also called coronary angiography, is an X-ray procedure used to look at the arteries in the heart. In this procedure, a dye (contrast dye) is injected through a long, hollow tube (catheter). The catheter is about the size of a piece of cooked spaghetti and is inserted through your groin, wrist, or arm. The dye is injected into each artery, and X-rays are then taken to show if there is a blockage in the arteries of your heart. LET [facility] CARE PROVIDER KNOW ABOUT:  Any allergies you have, including allergies to shellfish or contrast dye.   All medicines you are taking, including vitamins, herbs, eye drops, creams, and over-the-counter medicines.   Previous problems you or members of your family have had with the use of anesthetics.   Any blood disorders you have.   Previous surgeries you have had.  History of kidney problems or failure.   Other medical  conditions you have. RISKS AND COMPLICATIONS  Generally, a coronary angiogram is a safe procedure. However, problems can occur and include:  Allergic reaction to the dye.  Bleeding from the access site or other locations.  Kidney injury, especially in people with impaired kidney function.  Stroke (rare).  Heart attack (rare). BEFORE THE PROCEDURE   Do not eat or drink anything after midnight the night before the procedure or as directed by your health care provider.   Ask your health care provider about changing or stopping your regular medicines. This is especially important if you are taking diabetes medicines or blood thinners. PROCEDURE  You may be given a medicine to help you relax (sedative) before the procedure. This medicine is given through an intravenous (IV) access tube that is inserted into one of your veins.   The area where the catheter will be inserted will be washed and shaved. This is usually done in the groin but may be done in the fold of your arm (near your elbow) or in the wrist.   A medicine will be given to numb the area where the catheter will be inserted (local anesthetic).   The health care provider will insert the catheter into an artery. The catheter will be guided by using a special type of X-ray (fluoroscopy) of the blood vessel being examined.   A special dye will then be injected into the catheter, and X-rays will be taken. The dye will help to show where any narrowing or blockages are located in the heart arteries.  AFTER THE PROCEDURE   If the procedure is done through the leg, you will  be kept in bed lying flat for several hours. You will be instructed to not bend or cross your legs.  The insertion site will be checked frequently.   The pulse in your feet or wrist will be checked frequently.   Additional blood tests, X-rays, and an electrocardiogram may be done.  Document Released: 05/28/2003 Document Revised: 04/07/2014 Document  Reviewed: 04/15/2013 [facility] Patient Information 2015 [place], [place]. This information is not intended to replace advice given to you by your health care provider. Make sure you discuss any questions you have with your health care provider.

## 2015-03-24 ENCOUNTER — Ambulatory Visit (HOSPITAL_COMMUNITY)
Admission: RE | Admit: 2015-03-24 | Discharge: 2015-03-24 | Disposition: A | Discharge status: Home / Self Care | Payer: Medicare Other | Source: Ambulatory Visit | Attending: Cardiovascular Disease | Admitting: Cardiovascular Disease

## 2015-03-24 ENCOUNTER — Encounter (HOSPITAL_COMMUNITY)
Admission: RE | Disposition: A | Discharge status: Home / Self Care | Payer: Self-pay | Source: Ambulatory Visit | Attending: Cardiovascular Disease

## 2015-03-24 ENCOUNTER — Encounter (HOSPITAL_COMMUNITY): Payer: Self-pay | Admitting: Cardiovascular Disease

## 2015-03-24 DIAGNOSIS — I2582 Chronic total occlusion of coronary artery: Secondary | ICD-10-CM | POA: Insufficient documentation

## 2015-03-24 DIAGNOSIS — E119 Type 2 diabetes mellitus without complications: Secondary | ICD-10-CM | POA: Diagnosis not present

## 2015-03-24 DIAGNOSIS — K219 Gastro-esophageal reflux disease without esophagitis: Secondary | ICD-10-CM | POA: Insufficient documentation

## 2015-03-24 DIAGNOSIS — Z6841 Body Mass Index (BMI) 40.0 and over, adult: Secondary | ICD-10-CM | POA: Insufficient documentation

## 2015-03-24 DIAGNOSIS — Z951 Presence of aortocoronary bypass graft: Secondary | ICD-10-CM | POA: Insufficient documentation

## 2015-03-24 DIAGNOSIS — Z9851 Tubal ligation status: Secondary | ICD-10-CM | POA: Diagnosis not present

## 2015-03-24 DIAGNOSIS — I5032 Chronic diastolic (congestive) heart failure: Secondary | ICD-10-CM | POA: Insufficient documentation

## 2015-03-24 DIAGNOSIS — I2510 Atherosclerotic heart disease of native coronary artery without angina pectoris: Secondary | ICD-10-CM | POA: Diagnosis not present

## 2015-03-24 DIAGNOSIS — E785 Hyperlipidemia, unspecified: Secondary | ICD-10-CM | POA: Insufficient documentation

## 2015-03-24 DIAGNOSIS — I10 Essential (primary) hypertension: Secondary | ICD-10-CM | POA: Diagnosis not present

## 2015-03-24 DIAGNOSIS — I350 Nonrheumatic aortic (valve) stenosis: Secondary | ICD-10-CM

## 2015-03-24 DIAGNOSIS — Z794 Long term (current) use of insulin: Secondary | ICD-10-CM | POA: Insufficient documentation

## 2015-03-24 DIAGNOSIS — F419 Anxiety disorder, unspecified: Secondary | ICD-10-CM | POA: Diagnosis not present

## 2015-03-24 DIAGNOSIS — M1990 Unspecified osteoarthritis, unspecified site: Secondary | ICD-10-CM | POA: Diagnosis not present

## 2015-03-24 DIAGNOSIS — D649 Anemia, unspecified: Secondary | ICD-10-CM | POA: Insufficient documentation

## 2015-03-24 DIAGNOSIS — E669 Obesity, unspecified: Secondary | ICD-10-CM | POA: Diagnosis not present

## 2015-03-24 HISTORY — PX: LEFT AND RIGHT HEART CATHETERIZATION WITH CORONARY/GRAFT ANGIOGRAM: SHX5448

## 2015-03-24 LAB — POCT I-STAT 3, VENOUS BLOOD GAS (G3P V)
ACID-BASE EXCESS: 3 mmol/L — AB (ref 0.0–2.0)
Bicarbonate: 28.5 mEq/L — ABNORMAL HIGH (ref 20.0–24.0)
O2 SAT: 72 %
TCO2: 30 mmol/L (ref 0–100)
pCO2, Ven: 47.7 mmHg (ref 45.0–50.0)
pH, Ven: 7.385 — ABNORMAL HIGH (ref 7.250–7.300)
pO2, Ven: 39 mmHg (ref 30.0–45.0)

## 2015-03-24 LAB — POCT I-STAT 3, ART BLOOD GAS (G3+)
Acid-Base Excess: 1 mmol/L (ref 0.0–2.0)
BICARBONATE: 25.3 meq/L — AB (ref 20.0–24.0)
O2 Saturation: 96 %
PH ART: 7.424 (ref 7.350–7.450)
PO2 ART: 79 mmHg — AB (ref 80.0–100.0)
TCO2: 27 mmol/L (ref 0–100)
pCO2 arterial: 38.7 mmHg (ref 35.0–45.0)

## 2015-03-24 LAB — GLUCOSE, CAPILLARY
GLUCOSE-CAPILLARY: 87 mg/dL (ref 70–99)
Glucose-Capillary: 92 mg/dL (ref 70–99)

## 2015-03-24 SURGERY — LEFT AND RIGHT HEART CATHETERIZATION WITH CORONARY/GRAFT ANGIOGRAM
Anesthesia: LOCAL

## 2015-03-24 MED ORDER — SODIUM CHLORIDE 0.9 % IV SOLN: INTRAVENOUS | Status: AC

## 2015-03-24 MED ORDER — ASPIRIN 81 MG PO CHEW
CHEWABLE_TABLET | ORAL | Status: AC
  Administered 2015-03-24: 81 mg via ORAL
  Filled 2015-03-24: qty 1

## 2015-03-24 MED ORDER — HEPARIN (PORCINE) IN NACL 2-0.9 UNIT/ML-% IJ SOLN
INTRAMUSCULAR | Status: AC
  Filled 2015-03-24: qty 1500

## 2015-03-24 MED ORDER — SODIUM CHLORIDE 0.9 % IJ SOLN: 3.0000 mL | Freq: Two times a day (BID) | INTRAMUSCULAR | Status: DC

## 2015-03-24 MED ORDER — SODIUM CHLORIDE 0.9 % IV BOLUS (SEPSIS)
250.0000 mL | Freq: Once | INTRAVENOUS | Status: AC
  Administered 2015-03-24: 250 mL via INTRAVENOUS

## 2015-03-24 MED ORDER — ACETAMINOPHEN 325 MG PO TABS
ORAL_TABLET | ORAL | Status: AC
  Filled 2015-03-24: qty 2

## 2015-03-24 MED ORDER — SODIUM CHLORIDE 0.9 % IV SOLN
INTRAVENOUS | Status: DC
  Administered 2015-03-24: 1000 mL via INTRAVENOUS

## 2015-03-24 MED ORDER — HYDRALAZINE HCL 20 MG/ML IJ SOLN
INTRAMUSCULAR | Status: AC
  Filled 2015-03-24: qty 1

## 2015-03-24 MED ORDER — LIDOCAINE HCL (PF) 1 % IJ SOLN
INTRAMUSCULAR | Status: AC
  Filled 2015-03-24: qty 30

## 2015-03-24 MED ORDER — ACETAMINOPHEN 325 MG PO TABS
650.0000 mg | ORAL_TABLET | Freq: Once | ORAL | Status: AC
  Administered 2015-03-24: 650 mg via ORAL

## 2015-03-24 MED ORDER — SODIUM CHLORIDE 0.9 % IJ SOLN: 3.0000 mL | INTRAMUSCULAR | Status: DC | PRN

## 2015-03-24 MED ORDER — ASPIRIN 81 MG PO CHEW
81.0000 mg | CHEWABLE_TABLET | ORAL | Status: AC
  Administered 2015-03-24: 81 mg via ORAL

## 2015-03-24 MED ORDER — SODIUM CHLORIDE 0.9 % IV SOLN: 250.0000 mL | INTRAVENOUS | Status: DC | PRN

## 2015-03-24 NOTE — Progress Notes (Signed)
Site area: right groin  Site Prior to Removal:  Level 0  Pressure Applied For 20 MINUTES    Minutes Beginning at 0840  Manual:   Yes.    Patient Status During Pull:  stable  Post Pull Groin Site:  Level 0  Post Pull Instructions Given:  Yes.    Post Pull Pulses Present:  Yes.    Dressing Applied:  Yes.     Bedrest Begins: 0900  Comments:  Pt tolerated venous and arterial sheath pull from right groin well.

## 2015-03-24 NOTE — Interval H&P Note (Signed)
History and Physical Interval Note:  03/24/2015 7:11 AM  Katherine [name]  has presented today for cardiac cath with the diagnosis of CAD, aortic stenosis  The various methods of treatment have been discussed with the patient and family. After consideration of risks, benefits and other options for treatment, the patient has consented to  Procedure(s): LEFT AND RIGHT HEART CATHETERIZATION WITH CORONARY/GRAFT ANGIOGRAM (N/A) as a surgical intervention .  The patient's history has been reviewed, patient examined, no change in status, stable for surgery.  I have reviewed the patient's chart and labs.  Questions were answered to the patient's satisfaction.    No PCI will be performed today. This is pre-TAVR.    MCALHANY,CHRISTOPHER

## 2015-03-24 NOTE — CV Procedure (Addendum)
Cardiac Catheterization Operative Report  Katherine [name] [id number] 4/19/20167:31 AM Delphina Cahill, MD  Procedure Performed:  1. Left Heart Catheterization 2. Selective Coronary Angiography 3. Saphenous vein graft angiography 4. LIMA graft angiography  Operator: Lauree Chandler, MD  Indication: 81 yo female with history of severe aortic valve stenosis, CAD s/p 4V CABG in 2003, DM, HTN, hyperlipidemia, chronic diastolic CHF, obesity who is here today for right and left heart cath. I saw her in the valve clinic last week for discussion regarding her severe aortic valve stenosis. She is followed by Dr. Debara Pickett. She has known CAD and underwent 4 vessel CABG in 2003. Last cath was before her bypass. Normal stress myoview January 2015. She has diabetes managed with insulin and oral therapy. She has been followed for the last few years by Dr. Debara Pickett for moderate aortic valve stenosis. She was last seen in the office by Dr. Debara Pickett on 02/20/15 and c/o more shortness of breath. She has chronic diastolic heart failure with chronic lower extremity edema that has been managed effectively with Lasix. Echo 03/03/15 with normal LV systolic function, [id number]. There was mild concentric hypertrophy and grade 1 diastolic dysfunction. The aortic valve is heavily calcified with poor leaflet excursion. Mean gradient across the aortic valve was 37 mmHg and peak gradient was 62 mm Hg. Aortic valve area estimated at 1.46 cm2. She is limited by dyspnea with minimal exertion and fatigue. She has no energy. She has rare chest pains. She has no exertional chest pain. She walks with a cane. She denies orthopnea or PND. She has mild lower extremity edema. Weight has been overall stable for the last 4 months. She uses Lasix every day.  Cardiac cath today as part of the planning process for possible TAVR.   Procedure Details: The risks, benefits, complications, treatment options, and expected outcomes were discussed  with the patient. The patient and/or family concurred with the proposed plan, giving informed consent. The patient was brought to the cath lab after IV hydration was begun and oral premedication was given. The patient was not sedated. The right groin was prepped and draped in the usual manner. Using the modified Seldinger access technique, a 5 French sheath was placed in the right femoral artery. A 7 French sheath was placed in the right femoral vein. A right heart cath was performed with a balloon tipped catheter. Standard diagnostic catheters were used to perform selective coronary angiography. I engaged the left main with a JL4 catheter. The RCA, SVG to Diagonal, SVG to OM, LIMA to LAD were all engaged with a JR4 catheter. The SVG to PDA was engaged with a RCB catheter. I crossed the aortic valve with a straight wire using an AL-1 catheter.  There were no immediate complications. The patient was taken to the recovery area in stable condition.   Hemodynamic Findings: Central aortic pressure: 188/95 Left ventricular pressure: 205/24/28 Right atrial pressure: 5 Right ventricular pressure: 46/2/5 Pulmonary artery pressure: 45/9 (mean 22) Pulmonary capillary wedge pressure: 12 Cardiac output (Fick): 5.9 L/min Cardiac index (Fick): 2.9 L/min/m2 Central aortic saturation: 96% Pulmonary artery saturation: 72%  Aortic valve data:  Mean gradient: 13.1 mmHg Peak to peak gradient: 17.0 mmHg AVA estimated 2.0 cm2  Angiographic Findings:  Left main: Diffuse 20% stenosis.   Left Anterior Descending Artery: Large caliber vessel that courses to the apex. The mid vessel has diffuse 60% stenosis followed by sub-total occlusion. The mid and distal vessel fills from the patent IMA graft. The  diagonal branch is occluded and fills from the patent vein graft.   Circumflex Artery: Large caliber vessel with early intermediate branch, moderate obtuse marginal branch. The intermediate branch is a moderate caliber  vessel with ostial 60% stenosis. The proximal Circumflex is calcified proximally with diffuse 20% stenosis. The mid vessel has a 90% stenosis just before the takeoff of the obtuse marginal branch.   Right Coronary Artery: Large dominant vessel with 100% mid occlusion. The mid and distal vessel and distal branches fill from the patent vein graft.   Graft Anatomy:  SVG to PDA: Patent  SVG to Diagonal: Patent SVG to Circumflex: Patent LIMA to LAD: Patent  Left Ventricular Angiogram: Deferred  Impression: 1. Severe CAD s/p 4 CABG with 4/4 patent bypass grafts 2. Severe aortic valve stenosis with heavily calcified valve on echo with poor leaflet mobility. The pressure data from the cath does not correlate with the data from the echo but based on the appearance of the valve on echo and the difficulty crossing the valve, I think she has severe aortic stenosis.   Recommendations: Will continue workup for TAVR. She will be referred to see Dr. Servando Snare for surgical opinion.        Complications:  None. The patient tolerated the procedure well.

## 2015-03-24 NOTE — H&P (View-Only) (Signed)
Multi-Disciplinary Valve Clinic Note  Chief Complaint  Patient presents with  . Shortness of Breath    History of Present Illness: 81 yo female with history of severe aortic valve stenosis, CAD s/p 4V CABG in 2003, DM, HTN, hyperlipidemia, chronic diastolic CHF, obesity who is referred to the valve clinic today for discussion regarding her severe aortic valve stenosis. She is followed by Dr. Debara Pickett. She has known CAD and underwent 4 vessel CABG in 2003. Last cath was before her bypass. Normal stress myoview January 2015. She has diabetes managed with insulin and oral therapy. She has been followed for the last few years by Dr. Debara Pickett for moderate aortic valve stenosis. She was last seen in the office by Dr. Debara Pickett on 02/20/15 and c/o more shortness of breath. She has chronic diastolic heart failure with chronic lower extremity edema that has been managed effectively with Lasix. Echo 03/03/15 with normal LV systolic function, [id number]. There was mild concentric hypertrophy and grade 1 diastolic dysfunction. The aortic valve is heavily calcified with poor leaflet excursion. Mean gradient across the aortic valve was 37 mmHg and peak gradient was 62 mm Hg. Aortic valve area estimated at 1.46 cm2.   She tells me today that she has had a big change in her ability to perform normal activities of daily living. She is limited by dyspnea with minimal exertion and fatigue. She has no energy. She rare chest pains. She has no exertional chest pain. She walks with a cane. She denies orthopnea or PND. She has mild lower extremity edema. Weight has been overall stable for the last 4 months. She uses Lasix every day. 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    Primary Care Physician: Delphina Cahill Primary Cardiologist: Dr. Debara Pickett   Past Medical History  Diagnosis Date  . Gastric mass   . Hernia of unspecified site of abdominal cavity without mention of obstruction or gangrene     hiatal  . Gastric ulcer   . Thyroid  mass   . GERD (gastroesophageal reflux disease)   . Anxiety   . Coronary artery disease   . Osteoarthritis   . Anemia   . Hypertension   . Hyperlipidemia   . Diabetes mellitus, type 2   . Aortic valvular stenosis     moderate to severe  EF on 2D Echo 10/22/2012 65-70%  valve area of 1.2cm, and peak and mean gradients of 45 and 22mmHg  . CHF (congestive heart failure)   . Dyspnea     chronic  . Easy fatigability   . Normal cardiac stress test 12/2013    normal stress nuclear study    Past Surgical History  Procedure Laterality Date  . Coronary artery bypass graft      x4 in 2003  . Tubal ligation    . Knee surgery Right   . Uterine polypectomy    . Internal hemorrhoidectomy    . Cataract extraction Bilateral     Current outpatient prescriptions:  .  acetaminophen (TYLENOL) 500 MG tablet, Take 1,000 mg by mouth 2 (two) times daily. Take 1000 mg by mouth in the morning and take 1000 mg by mouth in the evening., Disp: , Rfl:  .  ALPRAZolam (XANAX) 0.25 MG tablet, Take 0.125-0.25 tablets by mouth 2 (two) times daily as needed for anxiety or sleep. , Disp: , Rfl:  .  fexofenadine (ALLEGRA) 180 MG tablet, Take 180 mg by mouth daily., Disp: , Rfl:  .  furosemide (LASIX) 20  MG tablet, Take 2 tablets daily (Patient taking differently: Take 40 mg by mouth daily. ), Disp: 60 tablet, Rfl: 6 .  insulin detemir (LEVEMIR FLEXPEN) 100 UNIT/ML injection, Inject 48 Units into the skin 2 (two) times daily. , Disp: , Rfl:  .  Ipratropium-Albuterol (COMBIVENT RESPIMAT) 20-100 MCG/ACT AERS respimat, Inhale 2 puffs into the lungs every 6 (six) hours as needed for wheezing or shortness of breath., Disp: 1 Inhaler, Rfl: 2 .  Linaclotide (LINZESS) 145 MCG CAPS capsule, Take 145 mcg by mouth daily as needed (for constipation)., Disp: , Rfl:  .  meloxicam (MOBIC) 7.5 MG tablet, Take 7.5 mg by mouth daily., Disp: , Rfl:  .  Memantine HCl ER (NAMENDA XR) 28 MG CP24, Take 28 mg by mouth every morning., Disp: ,  Rfl:  .  metFORMIN (GLUCOPHAGE-XR) 500 MG 24 hr tablet, Take 500 mg by mouth 2 (two) times daily., Disp: , Rfl:  .  nebivolol (BYSTOLIC) 10 MG tablet, Take 10 mg by mouth every morning., Disp: , Rfl:  .  NEXIUM 20 MG capsule, Take 20 mg by mouth daily. , Disp: , Rfl:  .  NON FORMULARY, Apply 1-2 application topically 4 (four) times daily. DIC 3 % / BAC 2 % / GAB 5 % / LID-PRIL 2.5 % - 2.5 % / MENT 1 %, Disp: , Rfl:  .  potassium chloride SA (K-DUR,KLOR-CON) 20 MEQ tablet, Take 20 mEq by mouth daily., Disp: , Rfl:  .  rosuvastatin (CRESTOR) 20 MG tablet, Take 20 mg by mouth at bedtime. , Disp: , Rfl:  .  VOLTAREN 1 % GEL, Apply 2 g topically daily as needed (pain). , Disp: , Rfl:  .  HYDROcodone-acetaminophen (NORCO) 5-325 MG per tablet, Take 1 tablet by mouth daily as needed for moderate pain. , Disp: , Rfl:  .  loperamide (IMODIUM) 2 MG capsule, Take 2 mg by mouth as needed for diarrhea or loose stools., Disp: , Rfl:   Current facility-administered medications:  .  0.9 %  sodium chloride infusion, 500 mL, Intravenous, Continuous, Lafayette Dragon, MD  No Known Allergies  History   Social History  . Marital Status: Widowed    Spouse Name: N/A  . Number of Children: 5  . Years of Education: N/A   Occupational History  . Health and safety inspector and CNA    Social History Main Topics  . Smoking status: Never Smoker   . Smokeless tobacco: Never Used  . Alcohol Use: No  . Drug Use: No  . Sexual Activity: Not on file   Other Topics Concern  . Not on file   Social History Narrative    Family History  Problem Relation Age of Onset  . Colon cancer Neg Hx   . Diabetes Mother   . Heart disease Mother   . Stroke Father   . Stroke Mother   . Heart attack Brother   . Cerebral aneurysm Mother     Review of Systems:  As stated in the HPI and otherwise negative.   BP 124/74 mmHg  Pulse 74  Ht 5\' 2"  (1.575 m)  Wt 234 lb (106.142 kg)  BMI 42.79 kg/m2  SpO2 94%  Physical  Examination: General: Well developed, well nourished, NAD HEENT: OP clear, mucus membranes moist SKIN: warm, dry. No rashes. Neuro: No focal deficits Musculoskeletal: Muscle strength 5/5 all ext Psychiatric: Mood and affect normal Neck: No JVD, no carotid bruits, no thyromegaly, no lymphadenopathy. Lungs:Clear bilaterally, no wheezes, rhonci, crackles Cardiovascular: Regular  rate and rhythm. Loud harsh systolic murmur noted. No gallops or rubs. Abdomen:Soft. Bowel sounds present. Non-tender.  Extremities: No lower extremity edema. Pulses are 2 + in the bilateral DP/PT.  Echo 03/03/15: Left ventricle: The cavity size was normal. There was mild concentric hypertrophy. Systolic function was vigorous. The estimated ejection fraction was in the range of 65% to 70%. Wall motion was normal; there were no regional wall motion abnormalities. Doppler parameters are consistent with abnormal left ventricular relaxation (grade 1 diastolic dysfunction). - Aortic valve: Cusp separation was reduced. There was moderate to severe stenosis. Peak velocity (S): 393 cm/s. Mean gradient (S): 37 mm Hg. - Mitral valve: Calcified annulus. Valve area by continuity equation (using LVOT flow): 3.55 cm^2. - Left atrium: The atrium was mildly dilated. Impressions: - Compared to the prior study, there has been no significant interval change.  EKG:  EKG is not ordered today. The ekg ordered today demonstrates  Recent Labs: 09/07/2014: ALT 12 11/21/2014: BUN 16; Creatinine 0.86; Hemoglobin 12.4; Platelets 306; Potassium 4.3; Pro B Natriuretic peptide (BNP) 555.2*; Sodium 135*   Lipid Panel No results found for: CHOL, TRIG, HDL, CHOLHDL, VLDL, LDLCALC, LDLDIRECT   Wt Readings from Last 3 Encounters:  03/17/15 234 lb (106.142 kg)  02/18/15 234 lb 14.4 oz (106.55 kg)  11/21/14 230 lb (104.327 kg)     Other studies Reviewed: Additional studies/ records that were reviewed today include:  Echocardiogram and stress test report, office notes from Dr Debara Pickett Review of the above records demonstrates: Severe AS  STS Risk Score Risk of Mortality: 5.768%  Morbidity or Mortality: 29.913%  Long Length of Stay: 17.651%  Short Length of Stay: 10.294%  Permanent Stroke: 3.084%  Prolonged Ventilation: 23.36%  DSW Infection: 0.391%  Renal Failure: 12.59%  Reoperation: 9.748%   Assessment and Plan:   1. Severe aortic valve stenosis: She has stage D severe symptomatic aortic stenosis. I have reviewed her echo personally and the valve is heavily calcified with poor leaflet mobility. Mean and peak gradients are elevated across the valve indicating severe aortic stenosis. She has had a fast progression of dyspnea over the last 6 months with fatigue. I think this is explained by her valve disease. She does have prior coronary revascularization with CABG in 2003 by Dr. Servando Snare. She would benefit from aortic valve replacement. I do not think she will be a good candidate for traditional open AVR given her advanced age, obesity, immobility and prior open chest surgery.Her STS risk score shows predicted mortality of 5.77% with open AVR. I think she would be a good candidate for TAVR. I have reviewed this in detail today with the patient and her daughter including the etiology of aortic valve disease. I have shown models of the heart, aortic valve and TAVR model. They would like to proceed with planning for TAVR. I will arrange a right and left heart [facility] on 03/24/[facility] at [facility]. Pre-cath labs today. I will discuss her case with our TAVR team and arrange f/u with Dr. Servando Snare for surgical opinion. If he agrees that she would be best treated with TAVR approach, she will then need referral to see Dr. Roxy Manns or Dr. Cyndia Bent and cardiac CT, CTA chest/abd/pelvis. She will also need PFTS.   Current medicines are reviewed at length with the patient today.  The patient does not have concerns regarding medicines.  The  following changes have been made:  no change  Labs/ tests ordered today include:  No orders of the defined types were  placed in this encounter.    Disposition:   FU with me for cardiac cath.   Signed, Lauree Chandler, MD 03/17/2015 1:45 PM    [facility] [place], [place],   [id number] Phone: [phone]; Fax: [phone]

## 2015-03-24 NOTE — Discharge Instructions (Signed)
Angiogram An angiogram, also called angiography, is a procedure used to look at the blood vessels that carry blood to different parts of your body (arteries). In this procedure, dye is injected through a long, thin tube (catheter) into an artery. X-rays are then taken. The X-rays will show if there is a blockage or problem in a blood vessel.  LET [facility] CARE PROVIDER KNOW ABOUT:  Any allergies you have, including allergies to shellfish or contrast dye.   All medicines you are taking, including vitamins, herbs, eye drops, creams, and over-the-counter medicines.   Previous problems you or members of your family have had with the use of anesthetics.   Any blood disorders you have.   Previous surgeries you have had.  Any previous kidney problems or failure you have had.  Medical conditions you have.   Possibility of pregnancy, if this applies. RISKS AND COMPLICATIONS Generally, an angiogram is a safe procedure. However, as with any procedure, problems can occur. Possible problems include:  Injury to the blood vessels, including rupture or bleeding.  Infection or bruising at the catheter site.  Allergic reaction to the dye or contrast used.  Kidney damage from the dye or contrast used.  Blood clots that can lead to a stroke or heart attack. BEFORE THE PROCEDURE  Do not eat or drink after midnight on the night before the procedure, or as directed by your health care provider.   Ask your health care provider if you may drink enough water to take any needed medicines the morning of the procedure.  PROCEDURE  You may be given a medicine to help you relax (sedative) before and during the procedure. This medicine is given through an IV access tube that is inserted into one of your veins.   The area where the catheter will be inserted will be washed and shaved. This is usually done in the groin but may be done in the fold of your arm (near your elbow) or in the wrist.  A  medicine will be given to numb the area where the catheter will be inserted (local anesthetic).  The catheter will be inserted with a guide wire into an artery. The catheter is guided by using a type of X-ray (fluoroscopy) to the blood vessel being examined.   Dye is then injected into the catheter, and X-rays are taken. The dye helps to show where any narrowing or blockages are located.  AFTER THE PROCEDURE   If the procedure is done through the leg, you will be kept in bed lying flat for several hours. You will be instructed to not bend or cross your legs.  The insertion site will be checked frequently.  The pulse in your feet or wrist will be checked frequently.  Additional blood tests, X-rays, and electrocardiography may be done.   You may need to stay in the hospital overnight for observation.  Document Released: 08/31/2005 Document Revised: 11/26/2013 Document Reviewed: 04/24/2013 [facility] Patient Information 2015 [place], [place]. This information is not intended to replace advice given to you by your health care provider. Make sure you discuss any questions you have with your health care provider. Angiogram, Care After Refer to this sheet in the next few weeks. These instructions provide you with information on caring for yourself after your procedure. Your health care provider may also give you more specific instructions. Your treatment has been planned according to current medical practices, but problems sometimes occur. Call your health care provider if you have any problems  or questions after your procedure.  WHAT TO EXPECT AFTER THE PROCEDURE After your procedure, it is typical to have the following sensations:  Minor discomfort or tenderness and a small bump at the catheter insertion site. The bump should usually decrease in size and tenderness within 1 to 2 weeks.  Any bruising will usually fade within 2 to 4 weeks. HOME CARE INSTRUCTIONS   You may need to keep taking  blood thinners if they were prescribed for you. Take medicines only as directed by your health care provider.  Do not apply powder or lotion to the site.  Do not take baths, swim, or use a hot tub until your health care provider approves.  You may shower 24 hours after the procedure. Remove the bandage (dressing) and gently wash the site with plain soap and water. Gently pat the site dry.  Inspect the site at least twice daily.  Limit your activity for the first 48 hours. Do not bend, squat, or lift anything over 20 lb (9 kg) or as directed by your health care provider.  Plan to have someone take you home after the procedure. Follow instructions about when you can drive or return to work. SEEK MEDICAL CARE IF:  You get light-headed when standing up.  You have drainage (other than a small amount of blood on the dressing).  You have chills.  You have a fever.  You have redness, warmth, swelling, or pain at the insertion site. SEEK IMMEDIATE MEDICAL CARE IF:   You develop chest pain or shortness of breath, feel faint, or pass out.  You have bleeding, swelling larger than a walnut, or drainage from the catheter insertion site.  You develop pain, discoloration, coldness, or severe bruising in the leg or arm that held the catheter.  You develop bleeding from any other place, such as the bowels. You may see bright red blood in your urine or stools, or your stools may appear black and tarry.  You have heavy bleeding from the site. If this happens, hold pressure on the site. MAKE SURE YOU:  Understand these instructions.  Will watch your condition.  Will get help right away if you are not doing well or get worse. Document Released: 06/09/2005 Document Revised: 04/07/2014 Document Reviewed: 04/15/2013 [facility] Patient Information 2015 [place], [place]. This information is not intended to replace advice given to you by your health care provider. Make sure you discuss any questions you  have with your health care provider.

## 2015-03-30 ENCOUNTER — Encounter: Payer: Medicare Other | Admitting: Cardiothoracic Surgery

## 2015-03-31 ENCOUNTER — Encounter: Payer: Self-pay | Admitting: Cardiothoracic Surgery

## 2015-03-31 ENCOUNTER — Other Ambulatory Visit: Payer: Self-pay | Admitting: *Deleted

## 2015-03-31 ENCOUNTER — Institutional Professional Consult (permissible substitution) (INDEPENDENT_AMBULATORY_CARE_PROVIDER_SITE_OTHER): Payer: Medicare Other | Admitting: Cardiothoracic Surgery

## 2015-03-31 VITALS — BP 160/73 | HR 71 | Resp 19 | Ht 62.0 in | Wt 234.0 lb

## 2015-03-31 DIAGNOSIS — I350 Nonrheumatic aortic (valve) stenosis: Secondary | ICD-10-CM | POA: Diagnosis not present

## 2015-03-31 DIAGNOSIS — I2510 Atherosclerotic heart disease of native coronary artery without angina pectoris: Secondary | ICD-10-CM

## 2015-03-31 NOTE — Progress Notes (Signed)
[address]       [place] [address]             [phone]                    Shaday G Janelle [facility] Medical Record [id number] Date of Birth: 1928/04/26  Referring: [name], MD Primary Care: Delphina Cahill, MD  Chief Complaint:    Chief Complaint  Patient presents with  . Aortic Stenosis    eval for AVR vs TAVR    History of Present Illness:    Katherine [name] 81 y.o. female is seen in the office  today forsevere aortic valve stenosis, CAD s/p 4V CABG in 2003 with patient grafts from recent cath, DM, HTN, hyperlipidemia, chronic diastolic CHF, obesity  .She is followed by Dr. Debara Pickett. She has known CAD and underwent 4 vessel CABG in 2003.She has diabetes managed with insulin and oral therapy. She has been followed for the last few years by Dr. Debara Pickett for moderate aortic valve stenosis. She has had  c/o more shortness of breath. She has chronic diastolic heart failure with chronic lower extremity edema that has been managed effectively with Lasix. Echo 03/03/15 with normal LV systolic function, [id number]. There was mild concentric hypertrophy and grade 1 diastolic dysfunction. The aortic valve is heavily calcified with poor leaflet excursion. Mean gradient across the aortic valve was 37 mmHg and peak gradient was 62 mm Hg.  Peak velocity 398 cm/sec. She is limited by dyspnea with minimal exertion and fatigue. Marland Kitchen She walks with a cane. She denies orthopnea or PND. She has mild lower extremity edema.  She uses Lasix every day.      Current Activity/ Functional Status:  Patient is not independent with mobility/ambulation, transfers, ADL's, IADL's.   Zubrod Score: At the time of surgery this patient's most appropriate activity status/level should be described as: []     0    Normal activity, no symptoms []     1    Restricted in physical strenuous activity but ambulatory, able to do out light work []     2    Ambulatory and capable of self care,  unable to do work activities, up and about               >50 % of waking hours                              []     3    Only limited self care, in bed greater than 50% of waking hours []     4    Completely disabled, no self care, confined to bed or chair []     5    Moribund   Past Medical History  Diagnosis Date  . Gastric mass   . Hernia of unspecified site of abdominal cavity without mention of obstruction or gangrene     hiatal  . Gastric ulcer   . Thyroid mass   . GERD (gastroesophageal reflux disease)   . Anxiety   . Coronary artery disease   . Osteoarthritis   . Anemia   . Hypertension   . Hyperlipidemia   . Diabetes mellitus, type 2   . Aortic valvular stenosis     moderate to severe  EF on 2D Echo 10/22/2012 65-70%  valve area of 1.2cm, and peak and mean gradients of 45 and 35mmHg  .  CHF (congestive heart failure)   . Dyspnea     chronic  . Easy fatigability   . Normal cardiac stress test 12/2013    normal stress nuclear study    Past Surgical History  Procedure Laterality Date  . Coronary artery bypass graft      x4 in 2003  . Tubal ligation    . Knee surgery Right   . Uterine polypectomy    . Internal hemorrhoidectomy    . Cataract extraction Bilateral   . Left and right heart catheterization with coronary/graft angiogram N/A 03/24/2015    Procedure: LEFT AND RIGHT HEART CATHETERIZATION WITH Beatrix Fetters;  Surgeon: Burnell Blanks, MD;  Location: [facility] CATH LAB;  Service: Cardiovascular;  Laterality: N/A;    Family History  Problem Relation Age of Onset  . Colon cancer Neg Hx   . Diabetes Mother   . Heart disease Mother   . Stroke Father   . Stroke Mother   . Heart attack Brother   . Cerebral aneurysm Mother     History   Social History  . Marital Status: Widowed    Spouse Name: N/A  . Number of Children: 5  . Years of Education: N/A   Occupational History  . Health and safety inspector and CNA    Social History Main Topics  . Smoking  status: Never Smoker   . Smokeless tobacco: Never Used  . Alcohol Use: No  . Drug Use: No  . Sexual Activity: Not on file   Other Topics Concern  . Not on file   Social History Narrative    History  Smoking status  . Never Smoker   Smokeless tobacco  . Never Used    History  Alcohol Use No     No Known Allergies  Current Outpatient Prescriptions  Medication Sig Dispense Refill  . acetaminophen (TYLENOL) 500 MG tablet Take 1,000 mg by mouth 2 (two) times daily. Take 1000 mg by mouth in the morning and take 1000 mg by mouth in the evening.    Marland Kitchen ALPRAZolam (XANAX) 0.25 MG tablet Take 0.125-0.25 tablets by mouth 2 (two) times daily as needed for anxiety or sleep.     . fexofenadine (ALLEGRA) 180 MG tablet Take 180 mg by mouth daily.    . furosemide (LASIX) 20 MG tablet Take 2 tablets daily (Patient taking differently: Take 40 mg by mouth daily. ) 60 tablet 6  . HYDROcodone-acetaminophen (NORCO) 5-325 MG per tablet Take 1 tablet by mouth daily as needed for moderate pain.     Marland Kitchen insulin detemir (LEVEMIR FLEXPEN) 100 UNIT/ML injection Inject 48 Units into the skin 2 (two) times daily.     . Ipratropium-Albuterol (COMBIVENT RESPIMAT) 20-100 MCG/ACT AERS respimat Inhale 2 puffs into the lungs every 6 (six) hours as needed for wheezing or shortness of breath. 1 Inhaler 2  . Linaclotide (LINZESS) 145 MCG CAPS capsule Take 145 mcg by mouth daily as needed (for constipation).    Marland Kitchen loperamide (IMODIUM) 2 MG capsule Take 2 mg by mouth as needed for diarrhea or loose stools.    . meloxicam (MOBIC) 7.5 MG tablet Take 7.5 mg by mouth daily.    . Memantine HCl ER (NAMENDA XR) 28 MG CP24 Take 28 mg by mouth every morning.    . metFORMIN (GLUCOPHAGE-XR) 500 MG 24 hr tablet     . nebivolol (BYSTOLIC) 10 MG tablet Take 10 mg by mouth every morning.    Marland Kitchen NEXIUM 20 MG capsule Take 20 mg  by mouth daily.     . NON FORMULARY Apply 1-2 application topically 4 (four) times daily. DIC 3 % / BAC 2 % / GAB 5  % / LID-PRIL 2.5 % - 2.5 % / MENT 1 %    . potassium chloride SA (K-DUR,KLOR-CON) 20 MEQ tablet Take 20 mEq by mouth daily.    . rosuvastatin (CRESTOR) 20 MG tablet Take 20 mg by mouth at bedtime.     . VOLTAREN 1 % GEL Apply 2 g topically daily as needed (pain).      No current facility-administered medications for this visit.      Review of Systems:     Cardiac Review of Systems: Y or N  Chest Pain [ y  ]  Resting SOB [ y  ] Exertional SOB  Blue.Reese  ]  Orthopnea [ y ]   Pedal Edema [ y  ]    Palpitations [ n ] Syncope  [n  ]   Presyncope [ n  ]  General Review of Systems: [Y] = yes [  ]=no Constitional: recent weight change [  ];  Wt loss over the last 3 months [   ] anorexia [  ]; fatigue Blue.Reese  ]; nausea [  ]; night sweats [  ]; fever [  ]; or chills [  ];          Dental: poor dentition[  ]; Last Dentist visit:   Eye : blurred vision [  ]; diplopia [   ]; vision changes [  ];  Amaurosis fugax[  ]; Resp: cough [  ];  wheezing[  ];  hemoptysis[  ]; shortness of breath[y  ]; paroxysmal nocturnal dyspnea[ y ]; dyspnea on exertion[y  ]; or orthopnea[ y ];  GI:  gallstones[  ], vomiting[  ];  dysphagia[  ]; melena[  ];  hematochezia [  ]; heartburn[  ];   Hx of  Colonoscopy[  ]; GU: kidney stones [  ]; hematuria[  ];   dysuria [  ];  nocturia[  ];  history of     obstruction [  ]; urinary frequency [  ]             Skin: rash, swelling[  ];, hair loss[  ];  peripheral edema[  ];  or itching[  ]; Musculosketetal: myalgias[  ];  joint swelling[  ];  joint erythema[  ];  joint pain[  ];  back pain[  ];  Heme/Lymph: bruising[  ];  bleeding[  ];  anemia[  ];  Neuro: TIA[ n ];  headaches[n  ];  stroke[  n];  vertigo[  ];  seizures[  ];   paresthesias[  ];  difficulty walking[  y];  Psych:depression[  ]; anxiety[y  ];  Endocrine: diabetes[  ];  thyroid dysfunction[  ];  Immunizations: Flu up to date [  ]; Pneumococcal up to date [  ];  Other:  Physical Exam: BP 160/73 mmHg  Pulse 71  Resp 19  Ht  5\' 2"  (1.575 m)  Wt 234 lb (106.142 kg)  BMI 42.79 kg/m2  SpO2 96%  PHYSICAL EXAMINATION: General appearance: alert, cooperative, appears older than stated age, no distress and slowed mentation Head: Normocephalic, without obvious abnormality, atraumatic Neck: no adenopathy, no carotid bruit, no JVD, supple, symmetrical, trachea midline and thyroid not enlarged, symmetric, no tenderness/mass/nodules Lymph nodes: Cervical, supraclavicular, and axillary nodes normal. Resp: clear to auscultation bilaterally Back: symmetric, no curvature. ROM normal. No CVA tenderness.  Cardio: systolic murmur: holosystolic 3/6, crescendo throughout the precordium GI: soft, non-tender; bowel sounds normal; no masses,  no organomegaly Extremities: edema bilateriaql, vein harvest form rt leg Neurologic: Grossly normal  Diagnostic Studies & Laboratory data:     Recent Radiology Findings:   No results found.     Recent Lab Findings: Lab Results  Component Value Date   WBC 9.1 03/17/2015   HGB 14.0 03/17/2015   HCT 41.5 03/17/2015   PLT 305.0 03/17/2015   GLUCOSE 136* 03/17/2015   ALT 12 09/07/2014   AST 14 09/07/2014   NA 138 03/17/2015   K 4.4 03/17/2015   CL 104 03/17/2015   CREATININE 1.03 03/17/2015   BUN 23 03/17/2015   CO2 27 03/17/2015   INR 1.0 03/17/2015   ------------------------------------------------------------------- LV EF: 65% -  70%  ------------------------------------------------------------------- Indications:   Aortic Stenosis (I35.0).  ------------------------------------------------------------------- History:  PMH: Edema, Obesity. Chest pain. Dyspnea. Coronary artery disease. Congestive heart failure. Aortic valve disease. Risk factors: Family history of coronary artery disease. Hypertension. Diabetes mellitus. Dyslipidemia.  ------------------------------------------------------------------- Study Conclusions  - Left ventricle: The cavity size was  normal. There was mild concentric hypertrophy. Systolic function was vigorous. The estimated ejection fraction was in the range of 65% to 70%. Wall motion was normal; there were no regional wall motion abnormalities. Doppler parameters are consistent with abnormal left ventricular relaxation (grade 1 diastolic dysfunction). - Aortic valve: Cusp separation was reduced. There was moderate to severe stenosis. Peak velocity (S): 393 cm/s. Mean gradient (S): 37 mm Hg. - Mitral valve: Calcified annulus. Valve area by continuity equation (using LVOT flow): 3.55 cm^2. - Left atrium: The atrium was mildly dilated.  Impressions:  - Compared to the prior study, there has been no significant interval change.  ------------------------------------------------------------------- Labs, prior tests, procedures, and surgery: Coronary artery bypass grafting.  Transthoracic echocardiography. M-mode, complete 2D, spectral Doppler, and color Doppler. Birthdate: Patient birthdate: March 29, 1928. Age: Patient is 81 yr old. Sex: Gender: female. BMI: 42.8 kg/m^2. Blood pressure:   164/68 Patient status: Outpatient. Study date: Study date: 03/03/2015. Study time: 01:36 PM. Location: Echo laboratory.  -------------------------------------------------------------------  ------------------------------------------------------------------- Left ventricle: The cavity size was normal. There was mild concentric hypertrophy. Systolic function was vigorous. The estimated ejection fraction was in the range of 65% to 70%. Wall motion was normal; there were no regional wall motion abnormalities. Doppler parameters are consistent with abnormal left ventricular relaxation (grade 1 diastolic dysfunction).  ------------------------------------------------------------------- Aortic valve: Poorly visualized. Trileaflet; moderately thickened, moderately calcified leaflets. Cusp separation  was reduced. Doppler:  There was moderate to severe stenosis.  There was no regurgitation.  VTI ratio of LVOT to aortic valve: 0.36. Valve area (VTI): 1.5 cm^2. Indexed valve area (VTI): 0.68 cm^2/m^2. Peak velocity ratio of LVOT to aortic valve: 0.3. Valve area (Vmax): 1.24 cm^2. Indexed valve area (Vmax): 0.56 cm^2/m^2. Mean velocity ratio of LVOT to aortic valve: 0.35. Valve area (Vmean): 1.46 cm^2. Indexed valve area (Vmean): 0.66 cm^2/m^2. Mean gradient (S): 37 mm Hg. Peak gradient (S): 62 mm Hg.  ------------------------------------------------------------------- Aorta: Aortic root: The aortic root was normal in size.  ------------------------------------------------------------------- Mitral valve:  Calcified annulus. Mobility was not restricted. Doppler: Transvalvular velocity was within the normal range. There was no evidence for stenosis. There was no regurgitation.  Valve area by pressure half-time: 3.01 cm^2. Indexed valve area by pressure half-time: 1.36 cm^2/m^2. Valve area by continuity equation (using LVOT flow): 3.55 cm^2. Indexed valve area by continuity equation (using LVOT flow): 1.6 cm^2/m^2.  Mean gradient (  D): 3 mm Hg. Peak gradient (D): 2 mm Hg.  ------------------------------------------------------------------- Left atrium: LA Volume/BSA= 29.4 ml/m2. The atrium was mildly dilated.  ------------------------------------------------------------------- Right ventricle: The cavity size was normal. Wall thickness was normal. Systolic function was normal.  ------------------------------------------------------------------- Pulmonic valve:  Structurally normal valve.  Cusp separation was normal. Doppler: Transvalvular velocity was within the normal range. There was no evidence for stenosis. There was no regurgitation.  ------------------------------------------------------------------- Tricuspid valve:  Structurally normal valve.   Doppler: Transvalvular velocity was within the normal range. There was mild regurgitation.  ------------------------------------------------------------------- Pulmonary artery:  The main pulmonary artery was normal-sized. Systolic pressure was within the normal range.  ------------------------------------------------------------------- Right atrium: The atrium was normal in size.  ------------------------------------------------------------------- Pericardium: There was no pericardial effusion.  ------------------------------------------------------------------- Systemic veins: Inferior vena cava: The vessel was normal in size. The respirophasic diameter changes were in the normal range (= 50%), consistent with normal central venous pressure. Diameter: 9.88 mm.  ------------------------------------------------------------------- Measurements  IVC                    Value     Reference ID                    9.88 mm    ---------  Left ventricle              Value     Reference LV ID, ED, PLAX chordal      (L)   40.7 mm    43 - 52 LV ID, ES, PLAX chordal          25.4 mm    23 - 38 LV fx shortening, PLAX chordal      38  %    >=29 LV PW thickness, ED            13.1 mm    --------- IVS/LV PW ratio, ED            1.02      <=1.3 Stroke volume, 2D             111  ml    --------- Stroke volume/bsa, 2D           50  ml/m^2  --------- LV e&', lateral              5.81 cm/s   --------- LV E/e&', lateral             12.82     --------- LV e&', medial               5.37 cm/s   --------- LV E/e&', medial              13.87     --------- LV e&', average              5.59 cm/s   --------- LV E/e&', average              13.33     ---------  Ventricular septum            Value     Reference IVS thickness, ED             13.3 mm    ---------  LVOT                   Value     Reference LVOT ID, S                23  mm    ---------  LVOT area                 4.15 cm^2   --------- LVOT peak velocity, S           117  cm/s   --------- LVOT mean velocity, S           81.5 cm/s   --------- LVOT VTI, S                26.8 cm    --------- LVOT peak gradient, S           5   mm Hg  ---------  Aortic valve               Value     Reference Aortic valve peak velocity, S       393  cm/s   --------- Aortic valve mean velocity, S       231  cm/s   --------- Aortic valve VTI, S            74.3 cm    --------- Aortic mean gradient, S          37  mm Hg  --------- Aortic peak gradient, S          62  mm Hg  --------- VTI ratio, LVOT/AV            0.36      --------- Aortic valve area, VTI          1.5  cm^2   --------- Aortic valve area/bsa, VTI        0.68 cm^2/m^2 --------- Velocity ratio, peak, LVOT/AV       0.3      --------- Aortic valve area, peak velocity     1.24 cm^2   --------- Aortic valve area/bsa, peak        0.56 cm^2/m^2 --------- velocity Velocity ratio, mean, LVOT/AV       0.35      --------- Aortic valve area, mean velocity     1.46 cm^2   --------- Aortic valve area/bsa, mean        0.66 cm^2/m^2 --------- velocity  Aorta                   Value     Reference Aortic root ID, ED            28  mm    ---------  Left atrium                Value     Reference LA ID, A-P, ES               41  mm    --------- LA ID/bsa, A-P              1.85 cm/m^2  <=2.2 LA volume, S               60  ml    --------- LA volume/bsa, S             27.1 ml/m^2  --------- LA volume, ES, 1-p A4C          41  ml    --------- LA volume/bsa, ES, 1-p A4C        18.5 ml/m^2  --------- LA volume, ES, 1-p A2C          77  ml    --------- LA volume/bsa, ES, 1-p A2C        34.8  ml/m^2  ---------  Mitral valve               Value     Reference Mitral E-wave peak velocity        74.5 cm/s   --------- Mitral A-wave peak velocity        12.6 cm/s   --------- Mitral mean velocity, D          73.7 cm/s   --------- Mitral deceleration time     (L)   148  ms    150 - 230 Mitral pressure half-time         73  ms    --------- Mitral mean gradient, D          3   mm Hg  --------- Mitral peak gradient, D          2   mm Hg  --------- Mitral E/A ratio, peak          5.91      --------- Mitral valve area, PHT, DP        3.01 cm^2   --------- Mitral valve area/bsa, PHT, DP      1.36 cm^2/m^2 --------- Mitral valve area, LVOT          3.55 cm^2   --------- continuity Mitral valve area/bsa, LVOT        1.6  cm^2/m^2 --------- continuity Mitral annulus VTI, D           31.3 cm    ---------  Tricuspid valve              Value     Reference Tricuspid regurg peak velocity      297  cm/s   --------- Tricuspid peak RV-RA gradient       35  mm Hg  ---------  Right ventricle              Value     Reference RV s&', lateral, S             14.3 cm/s   ---------  Legend: (L) and (H) mark values outside specified reference  range.  ------------------------------------------------------------------- Prepared and Electronically Authenticated by  Candee Furbish, M.D. 2016-03-29T16:40:55    Assessment / Plan:   Symptomatic critical AS in elderly patient previous cabg , due to over physical condition is not an operative candidate for open redo cardiac surgery avr, consider for TAVR    THYROID MASS  DIABETES MELLITUS, TYPE II  HYPERLIPIDEMIA  UNSPECIFIED ANEMIA  ANXIETY  Essential hypertension  Coronary atherosclerosis  CHF  REFLUX ESOPHAGITIS  HIATAL HERNIA  OSTEOARTHRITIS  GASTRIC ULCER, HX OF  COLONIC POLYPS, HX OF  Diabetes mellitus, type 2  Gastric mass  Moderate to severe aortic stenosis  S/P CABG x 4  Morbid obesity  Difficulty walking  Muscle weakness (generalized)  Chest pain  CHF with left ventricular diastolic dysfunction, NYHA class 2  Bilateral leg edema  Acute diastolic congestive heart failure, NYHA class 3    I  spent 66minutes counseling the patient face to face and 50% or more the  time was spent in counseling and coordination of care. The total time spent in the appointment was 60 minutes.  Grace Isaac MD      [address] [place] [address] Office [phone]   Beeper [phone]  03/31/2015 1:49 PM

## 2015-04-01 ENCOUNTER — Other Ambulatory Visit: Payer: Self-pay | Admitting: *Deleted

## 2015-04-01 DIAGNOSIS — I350 Nonrheumatic aortic (valve) stenosis: Secondary | ICD-10-CM

## 2015-04-10 ENCOUNTER — Encounter (HOSPITAL_COMMUNITY): Payer: Self-pay

## 2015-04-10 ENCOUNTER — Ambulatory Visit (HOSPITAL_COMMUNITY)
Admission: RE | Admit: 2015-04-10 | Discharge: 2015-04-10 | Disposition: A | Discharge status: Home / Self Care | Payer: Medicare Other | Source: Ambulatory Visit | Attending: Cardiovascular Disease | Admitting: Cardiovascular Disease

## 2015-04-10 ENCOUNTER — Ambulatory Visit (HOSPITAL_COMMUNITY): Payer: Medicare Other

## 2015-04-10 ENCOUNTER — Ambulatory Visit: Payer: Medicare Other | Attending: Surgery | Admitting: Physical Therapy

## 2015-04-10 ENCOUNTER — Encounter: Payer: Self-pay | Admitting: Physical Therapy

## 2015-04-10 DIAGNOSIS — I350 Nonrheumatic aortic (valve) stenosis: Secondary | ICD-10-CM | POA: Diagnosis not present

## 2015-04-10 DIAGNOSIS — R531 Weakness: Secondary | ICD-10-CM | POA: Diagnosis not present

## 2015-04-10 DIAGNOSIS — N289 Disorder of kidney and ureter, unspecified: Secondary | ICD-10-CM | POA: Diagnosis not present

## 2015-04-10 DIAGNOSIS — I2510 Atherosclerotic heart disease of native coronary artery without angina pectoris: Secondary | ICD-10-CM | POA: Diagnosis not present

## 2015-04-10 DIAGNOSIS — R911 Solitary pulmonary nodule: Secondary | ICD-10-CM | POA: Diagnosis not present

## 2015-04-10 DIAGNOSIS — Z951 Presence of aortocoronary bypass graft: Secondary | ICD-10-CM | POA: Diagnosis not present

## 2015-04-10 DIAGNOSIS — M25511 Pain in right shoulder: Secondary | ICD-10-CM | POA: Diagnosis not present

## 2015-04-10 DIAGNOSIS — I700 Atherosclerosis of aorta: Secondary | ICD-10-CM | POA: Diagnosis not present

## 2015-04-10 DIAGNOSIS — R262 Difficulty in walking, not elsewhere classified: Secondary | ICD-10-CM | POA: Diagnosis not present

## 2015-04-10 DIAGNOSIS — M25611 Stiffness of right shoulder, not elsewhere classified: Secondary | ICD-10-CM

## 2015-04-10 DIAGNOSIS — K429 Umbilical hernia without obstruction or gangrene: Secondary | ICD-10-CM | POA: Diagnosis not present

## 2015-04-10 DIAGNOSIS — Z01810 Encounter for preprocedural cardiovascular examination: Secondary | ICD-10-CM | POA: Insufficient documentation

## 2015-04-10 DIAGNOSIS — M25612 Stiffness of left shoulder, not elsewhere classified: Secondary | ICD-10-CM

## 2015-04-10 LAB — PULMONARY FUNCTION TEST
FEF 25-75 POST: 0.69 L/s
FEF 25-75 Pre: 1.59 L/sec
FEF2575-%Change-Post: -56 %
FEF2575-%PRED-PRE: 162 %
FEF2575-%Pred-Post: 70 %
FEV1-%Change-Post: -16 %
FEV1-%PRED-POST: 71 %
FEV1-%Pred-Pre: 85 %
FEV1-PRE: 1.06 L
FEV1-Post: 0.89 L
FEV1FVC-%CHANGE-POST: -3 %
FEV1FVC-%Pred-Pre: 119 %
FEV6-%CHANGE-POST: -13 %
FEV6-%PRED-POST: 67 %
FEV6-%Pred-Pre: 78 %
FEV6-Post: 1.03 L
FEV6-Pre: 1.19 L
FEV6FVC-%Pred-Post: 105 %
FEV6FVC-%Pred-Pre: 105 %
FVC-%CHANGE-POST: -13 %
FVC-%Pred-Post: 64 %
FVC-%Pred-Pre: 74 %
FVC-Post: 1.03 L
FVC-Pre: 1.19 L
Post FEV1/FVC ratio: 86 %
Post FEV6/FVC ratio: 100 %
Pre FEV1/FVC ratio: 89 %
Pre FEV6/FVC Ratio: 100 %
RV % pred: 77 %
RV: 1.84 L
TLC % pred: 68 %
TLC: 3.26 L

## 2015-04-10 MED ORDER — IOHEXOL 350 MG/ML SOLN
100.0000 mL | Freq: Once | INTRAVENOUS | Status: AC | PRN
  Administered 2015-04-10: 100 mL via INTRAVENOUS

## 2015-04-10 MED ORDER — ALBUTEROL SULFATE (2.5 MG/3ML) 0.083% IN NEBU
2.5000 mg | INHALATION_SOLUTION | Freq: Once | RESPIRATORY_TRACT | Status: AC
Start: 2015-04-10 — End: 2015-04-10
  Administered 2015-04-10: 2.5 mg via RESPIRATORY_TRACT

## 2015-04-10 NOTE — Therapy (Signed)
[place], [place], [address] Phone: [phone]   Fax:  [phone]  Physical Therapy Evaluation  Patient Details  Name: Katherine [name] MRN: [id number] Date of Birth: [birth year] Referring Provider:  Gaye Pollack, MD  Encounter Date: 04/10/2015      PT End of Session - 04/10/15 0854    Visit Number 1   PT Start Time [id number]   PT Stop Time 0940   PT Time Calculation (min) 46 min   Activity Tolerance Patient tolerated treatment well      Past Medical History  Diagnosis Date  . Gastric mass   . Hernia of unspecified site of abdominal cavity without mention of obstruction or gangrene     hiatal  . Gastric ulcer   . Thyroid mass   . GERD (gastroesophageal reflux disease)   . Anxiety   . Coronary artery disease   . Osteoarthritis   . Anemia   . Hypertension   . Hyperlipidemia   . Diabetes mellitus, type 2   . Aortic valvular stenosis     moderate to severe  EF on 2D Echo 10/22/2012 65-70%  valve area of 1.2cm, and peak and mean gradients of 45 and 37mmHg  . CHF (congestive heart failure)   . Dyspnea     chronic  . Easy fatigability   . Normal cardiac stress test 12/2013    normal stress nuclear study    Past Surgical History  Procedure Laterality Date  . Coronary artery bypass graft      x4 in 2003  . Tubal ligation    . Knee surgery Right   . Uterine polypectomy    . Internal hemorrhoidectomy    . Cataract extraction Bilateral   . Left and right heart catheterization with coronary/graft angiogram N/A 03/24/2015    Procedure: LEFT AND RIGHT HEART CATHETERIZATION WITH Beatrix Fetters;  Surgeon: Burnell Blanks, MD;  Location: [facility] CATH LAB;  Service: Cardiovascular;  Laterality: N/A;    There were no vitals filed for this visit.  Visit Diagnosis:  Difficulty walking - Plan: PT plan of care cert/re-cert  Generalized weakness - Plan: PT plan of care  cert/re-cert  Decreased ROM of right shoulder - Plan: PT plan of care cert/re-cert  Decreased ROM of left shoulder - Plan: PT plan of care cert/re-cert      Subjective Assessment - 04/10/15 0859    Subjective dx with aortic stenosis about 6 years ago but remained stable, progressed over last 2 years, and pt really noticed effects over this past year   Pertinent History CABGx4 13 years ago   Patient Stated Goals complete as part of pre-TAVR eval   Currently in Pain? No/denies            [facility] PT Assessment - 04/10/15 0001    Assessment   Medical Diagnosis severe aortic stenosis   Onset Date 04/09/14  approximate   Precautions   Precautions None   Restrictions   Weight Bearing Restrictions No   Balance Screen   Has the patient fallen in the past 6 months No   Has the patient had a decrease in activity level because of a fear of falling?  No   Is the patient reluctant to leave their home because of a fear of falling?  No   Home Environment   Living Enviornment Private residence   Living Arrangements Children   Home Access Ramped entrance   [place] Two level  pt resides on main level   Prior Function   Level of Independence Independent with basic ADLs  now getting assist from CNA   Posture/Postural Control   Posture/Postural Control Postural limitations   Postural Limitations Forward head;Rounded Shoulders   ROM / Strength   AROM / PROM / Strength AROM;Strength   AROM   Overall AROM Comments Shoulder flexion limited bil 85-90 degrees with R more limited than left, otherwise [facility]   Strength   Overall Strength Comments LE grossly 4/5, UE grossly 3/5   Strength Assessment Site Hand   Right/Left hand Right;Left   Grip (lbs) 21  R hand dominant   Grip (lbs) 18  L   Ambulation/Gait   Ambulation/Gait Yes   Ambulation/Gait Assistance 5: Supervision   Assistive device Straight cane   Gait Pattern Decreased step length - right;Decreased step length - left;Wide base of  support          [facility] Pre-Surgical Assessment - 04/10/15 0001    5 Meter Walk Test- trial 1 37.4 sec  tested once due ot slow speed (>6 sec)   Timed Up & Go Test trial  61.3 sec.   Comments >/= 12 sec indicates increased fall risk and >/= 30 seconds indicates difficulties with ADL's   4 Stage Balance Test tolerated for:  10 sec.   4 Stage Balance Test Position 2   comment unable to assume position 3   Comment unable to arise without UE support   ADL/IADL Needs Assistance with: Bathing;Dressing;Meal prep;Finances;Yard work   ADL/IADL Therapist, sports Index Midly frail   6 Minute Walk- Baseline yes   BP (mmHg) 150/72 mmHg   HR (bpm) 65   02 Sat (%RA) 93 %   Modified Borg Scale for Dyspnea 2- Mild shortness of breath   Perceived Rate of Exertion (Borg) 6-   6 Minute Walk Post Test yes   BP (mmHg) 152/78 mmHg   HR (bpm) 87   02 Sat (%RA) 93 %   Modified Borg Scale for Dyspnea 3- Moderate shortness of breath or breathing difficulty   Perceived Rate of Exertion (Borg) 17- Very hard   Aerobic Endurance Distance Walked 112   Endurance additional comments Pt took a few short standing rest breaks. She moves very slowly.                          PT Education - 04/10/15 [phone]    Education provided Yes   Education Details fall risk and benefits of use of RW   Person(s) Educated Patient;Child(ren)   Methods Explanation   Comprehension Verbalized understanding                    Plan - 04/10/15 0958    Clinical Impression Statement Pt is an 81 yo female presenting to OP PT for evaluation prior to possible TAVR surgery for severe aortic stenosis. Pt's overall mobility is slow and limited. Pt is currently requiring assist with bathing. She reports she can dress herself but takes >1 hour. Pt is at a high fall risk and could benefit from use of front wheeled RW. Discussed with pt/daughter and they want to wait until after surgery to see how she is doing. Pt's LE strength  is limited and pt requires UE support to stand from chair. Pt's UE ROM and strength are limited.   PT Frequency One time visit   Consulted and Agree with Plan of Care Patient;Family member/caregiver  G-Codes - 04/10/15 1023    Functional Assessment Tool Used 6 minute walk 112'   Functional Limitation Mobility: Walking and moving around   Mobility: Walking and Moving Around Current Status [phone]) At least 80 percent but less than 100 percent impaired, limited or restricted   Mobility: Walking and Moving Around Goal Status [phone]) At least 80 percent but less than 100 percent impaired, limited or restricted   Mobility: Walking and Moving Around Discharge Status [phone]) At least 80 percent but less than 100 percent impaired, limited or restricted       Problem List Patient Active Problem List   Diagnosis Date Noted  . Bilateral leg edema 10/17/2014  . Acute diastolic congestive heart failure, NYHA class 3 10/17/2014  . CHF with left ventricular diastolic dysfunction, NYHA class 2 09/09/2014  . Chest pain 12/04/2013  . Difficulty walking 10/28/2013  . Muscle weakness (generalized) 10/28/2013  . Morbid obesity 10/24/2013  . Moderate to severe aortic stenosis 04/22/2013  . S/P CABG x 4 04/22/2013  . Gastric mass 08/18/2011  . Diabetes mellitus, type 2   . CHF 03/04/2010  . COLONIC POLYPS, HX OF 03/04/2010  . THYROID MASS 02/25/2010  . DIABETES MELLITUS, TYPE II 02/25/2010  . HYPERLIPIDEMIA 02/25/2010  . UNSPECIFIED ANEMIA 02/25/2010  . ANXIETY 02/25/2010  . Essential hypertension 02/25/2010  . Coronary atherosclerosis 02/25/2010  . REFLUX ESOPHAGITIS 02/25/2010  . HIATAL HERNIA 02/25/2010  . OSTEOARTHRITIS 02/25/2010  . GASTRIC ULCER, HX OF 02/25/2010    Romualdo Bolk, PT 04/10/2015, 10:28 AM  [facility] [place] [address] [place], [place], [address] Phone: [phone]   Fax:  [phone]

## 2015-04-14 ENCOUNTER — Ambulatory Visit (HOSPITAL_COMMUNITY)
Admission: RE | Admit: 2015-04-14 | Discharge: 2015-04-14 | Disposition: A | Discharge status: Home / Self Care | Payer: Medicare Other | Source: Ambulatory Visit | Attending: Cardiovascular Disease | Admitting: Cardiovascular Disease

## 2015-04-14 DIAGNOSIS — I2510 Atherosclerotic heart disease of native coronary artery without angina pectoris: Secondary | ICD-10-CM | POA: Diagnosis not present

## 2015-04-14 DIAGNOSIS — Z951 Presence of aortocoronary bypass graft: Secondary | ICD-10-CM | POA: Insufficient documentation

## 2015-04-14 DIAGNOSIS — I517 Cardiomegaly: Secondary | ICD-10-CM | POA: Diagnosis not present

## 2015-04-14 DIAGNOSIS — I700 Atherosclerosis of aorta: Secondary | ICD-10-CM | POA: Diagnosis not present

## 2015-04-14 DIAGNOSIS — I350 Nonrheumatic aortic (valve) stenosis: Secondary | ICD-10-CM | POA: Diagnosis not present

## 2015-04-14 DIAGNOSIS — R918 Other nonspecific abnormal finding of lung field: Secondary | ICD-10-CM | POA: Insufficient documentation

## 2015-04-14 MED ORDER — METOPROLOL TARTRATE 1 MG/ML IV SOLN
5.0000 mg | Freq: Once | INTRAVENOUS | Status: DC
  Filled 2015-04-14: qty 5

## 2015-04-14 MED ORDER — METOPROLOL TARTRATE 1 MG/ML IV SOLN
INTRAVENOUS | Status: AC
  Administered 2015-04-14: 5 mg
  Filled 2015-04-14: qty 5

## 2015-04-14 MED ORDER — IOHEXOL 350 MG/ML SOLN
80.0000 mL | Freq: Once | INTRAVENOUS | Status: AC | PRN
  Administered 2015-04-14: 80 mL via INTRAVENOUS

## 2015-04-15 ENCOUNTER — Institutional Professional Consult (permissible substitution) (INDEPENDENT_AMBULATORY_CARE_PROVIDER_SITE_OTHER): Payer: Medicare Other | Admitting: Surgery

## 2015-04-15 ENCOUNTER — Encounter: Payer: Self-pay | Admitting: Surgery

## 2015-04-15 VITALS — BP 149/78 | HR 67 | Resp 16 | Ht 62.0 in | Wt 234.0 lb

## 2015-04-15 DIAGNOSIS — I350 Nonrheumatic aortic (valve) stenosis: Secondary | ICD-10-CM

## 2015-04-16 ENCOUNTER — Encounter: Payer: Self-pay | Admitting: Surgery

## 2015-04-16 NOTE — Progress Notes (Signed)
Patient ID: Katherine [name], female   DOB: 1928/02/03, 81 y.o.   MRN: [id number]   [place] [facility]SULTATION REPORT  Referring Provider is Hilty, Nadean Corwin, MD PCP is Delphina Cahill, MD  Chief Complaint  Patient presents with  . Aortic Stenosis    2ND TAVR eval...saw Dr. Servando Snare on 03/31/15....review studies done    HPI:  She is an 81 yo female with history of severe aortic valve stenosis, CAD s/p 4V CABG in 2003, DM, HTN, hyperlipidemia, chronic diastolic CHFand obesity who is referred for consideration of TAVR as a treatment option for her severe, symptomatic aortic stenosis. She has been followed for the last few years by Dr. Debara Pickett for moderate aortic valve stenosis. She presented in March with an approximately 6 month history of progressive exertional shortness of breath and fatigue that was occuring with walking short distances in her house. She has chronic diastolic heart failure with chronic lower extremity edema that has been managed effectively with Lasix. An echo on 03/03/15 shows normal LV systolic function with an LVEF of 65-70%. There was mild concentric hypertrophy and grade 1 diastolic dysfunction. The aortic valve is heavily calcified with poor leaflet excursion. Mean gradient across the aortic valve was 37 mmHg and peak gradient was 62 mm Hg. Aortic valve area estimated at 1.46 cm2. Cardiac cath on 03/24/2015 showed all 4 vein grafts and the LIMA were patent. The pressure data did not correlate with the echo showing a mean gradient of only 13 and a peak to peak of 17 mm Hg. Right heart pressures were normal and CI was 2.9. She saw Dr. Servando Snare for a surgical opinion since he did her initial CABG surgery and he did not feel that she was a candidate for redo sternotomy and AVR and should be referred for TAVR.  The patient lives with her daughter and has 3 daughters who live in the area and are very  involved in her life. She walks with a cane due to balance problems and is now very limited in her ambulation due to marked shortness of breath walking around her house. She was reportedly much more active prior to the decline over the last 6 months.   Past Medical History  Diagnosis Date  . Gastric mass   . Hernia of unspecified site of abdominal cavity without mention of obstruction or gangrene     hiatal  . Gastric ulcer   . Thyroid mass   . GERD (gastroesophageal reflux disease)   . Anxiety   . Coronary artery disease   . Osteoarthritis   . Anemia   . Hypertension   . Hyperlipidemia   . Diabetes mellitus, type 2   . Aortic valvular stenosis     moderate to severe  EF on 2D Echo 10/22/2012 65-70%  valve area of 1.2cm, and peak and mean gradients of 45 and 36mmHg  . CHF (congestive heart failure)   . Dyspnea     chronic  . Easy fatigability   . Normal cardiac stress test 12/2013    normal stress nuclear study    Past Surgical History  Procedure Laterality Date  . Coronary artery bypass graft      x4 in 2003  . Tubal ligation    . Knee surgery Right   . Uterine polypectomy    . Internal hemorrhoidectomy    . Cataract extraction Bilateral   . Left and right heart catheterization with  coronary/graft angiogram N/A 03/24/2015    Procedure: LEFT AND RIGHT HEART CATHETERIZATION WITH Beatrix Fetters;  Surgeon: Burnell Blanks, MD;  Location: [facility] CATH LAB;  Service: Cardiovascular;  Laterality: N/A;    Family History  Problem Relation Age of Onset  . Colon cancer Neg Hx   . Diabetes Mother   . Heart disease Mother   . Stroke Father   . Stroke Mother   . Heart attack Brother   . Cerebral aneurysm Mother     History   Social History  . Marital Status: Widowed    Spouse Name: N/A  . Number of Children: 5  . Years of Education: N/A   Occupational History  . Health and safety inspector and CNA    Social History Main Topics  . Smoking status: Never Smoker   .  Smokeless tobacco: Never Used  . Alcohol Use: No  . Drug Use: No  . Sexual Activity: Not on file   Other Topics Concern  . Not on file   Social History Narrative    Current Outpatient Prescriptions  Medication Sig Dispense Refill  . acetaminophen (TYLENOL) 500 MG tablet Take 1,000 mg by mouth 2 (two) times daily. Take 1000 mg by mouth in the morning and take 1000 mg by mouth in the evening.    Marland Kitchen ALPRAZolam (XANAX) 0.25 MG tablet Take 0.125-0.25 tablets by mouth 2 (two) times daily as needed for anxiety or sleep.     . fexofenadine (ALLEGRA) 180 MG tablet Take 180 mg by mouth daily.    . furosemide (LASIX) 20 MG tablet Take 2 tablets daily (Patient taking differently: Take 40 mg by mouth daily. ) 60 tablet 6  . HYDROcodone-acetaminophen (NORCO) 5-325 MG per tablet Take 1 tablet by mouth daily as needed for moderate pain.     Marland Kitchen insulin detemir (LEVEMIR FLEXPEN) 100 UNIT/ML injection Inject 48 Units into the skin 2 (two) times daily.     . Ipratropium-Albuterol (COMBIVENT RESPIMAT) 20-100 MCG/ACT AERS respimat Inhale 2 puffs into the lungs every 6 (six) hours as needed for wheezing or shortness of breath. 1 Inhaler 2  . Linaclotide (LINZESS) 145 MCG CAPS capsule Take 145 mcg by mouth daily as needed (for constipation).    Marland Kitchen loperamide (IMODIUM) 2 MG capsule Take 2 mg by mouth as needed for diarrhea or loose stools.    . meloxicam (MOBIC) 7.5 MG tablet Take 7.5 mg by mouth daily.    . Memantine HCl ER (NAMENDA XR) 28 MG CP24 Take 28 mg by mouth every morning.    . metFORMIN (GLUCOPHAGE-XR) 500 MG 24 hr tablet     . nebivolol (BYSTOLIC) 10 MG tablet Take 10 mg by mouth every morning.    Marland Kitchen NEXIUM 20 MG capsule Take 20 mg by mouth daily.     . NON FORMULARY Apply 1-2 application topically 4 (four) times daily. DIC 3 % / BAC 2 % / GAB 5 % / LID-PRIL 2.5 % - 2.5 % / MENT 1 %    . potassium chloride SA (K-DUR,KLOR-CON) 20 MEQ tablet Take 20 mEq by mouth daily.    . rosuvastatin (CRESTOR) 20 MG  tablet Take 20 mg by mouth at bedtime.     . VOLTAREN 1 % GEL Apply 2 g topically daily as needed (pain).      No current facility-administered medications for this visit.    No Known Allergies    Review of Systems:   General:  normal appetite, decreased energy, no weight gain,  no weight loss, no fever  Cardiac:  no chest pain with exertion, no chest pain at rest, markedSOB with mild exertion, no resting SOB, no PND, no orthopnea, no palpitations, no arrhythmia, no atrial fibrillation, mild LE edema, no dizzy spells, no syncope  Respiratory:  has shortness of breath, no home oxygen, no productive cough, no dry cough, no bronchitis, no wheezing, no hemoptysis, no asthma, no pain with inspiration or cough, no sleep apnea, no CPAP at night  GI:   no difficulty swallowing, no reflux, no frequent heartburn, no hiatal hernia, no abdominal pain, no constipation, no diarrhea, no hematochezia, no hematemesis, no melena  GU:   no dysuria,  no frequency, no urinary tract infection, no hematuria, no kidney stones, no kidney disease  Vascular:  no pain suggestive of claudication, no pain in feet, no leg cramps, no varicose veins, no DVT, no non-healing foot ulcer  Neuro:   no stroke, no TIA's, no seizures, no headaches, notemporary blindness one eye,  no slurred speech, no peripheral neuropathy, no chronic pain, has instability of gait, no memory/cognitive dysfunction  Musculoskeletal: has arthritis, has joint swelling, no myalgias, has difficulty walking due to arthritis and balance problem, decreased mobility   Skin:   no rash, no itching, no skin infections, no pressure sores or ulcerations  Psych:   no anxiety, no depression, no nervousness, no unusual recent stress  Eyes:   no blurry vision, no floaters, no recent vision changes,  wears glasses or contacts  ENT:   has hearing loss in right ear, no loose or painful teeth, has dentures  Hematologic:  no easy bruising, no abnormal bleeding, no clotting  disorder, no frequent epistaxis  Endocrine:  has diabetes, does not check CBG's at home           Physical Exam:   BP 149/78 mmHg  Pulse 67  Resp 16  Ht 5\' 2"  (1.575 m)  Wt 234 lb (106.142 kg)  BMI 42.79 kg/m2  SpO2 98%  General:  Elderly, obese, frail-appearing but in no distress  HEENT:  Unremarkable , NCAT, PERLA, EOMI, oropharynx clear  Neck:   no JVD, no bruits, no adenopathy or thyromegaly  Chest:   clear to auscultation, symmetrical breath sounds, no wheezes, no rhonchi   CV:   RRR, grade III/VI crescendo/decrescendo murmur heard best at RSB,  no diastolic murmur  Abdomen:  soft, non-tender, no masses or organomegaly  Extremities:  warm, well-perfused, pulses palpable, trace LE edema  Rectal/GU  Deferred  Neuro:   Grossly non-focal and symmetrical throughout  Skin:   Clean and dry, no rashes, no breakdown   Diagnostic Tests:    *[facility], [place], [place] [address]              [phone]  ------------------------------------------------------------------- Transthoracic Echocardiography  Patient:  Keneisha, Milosevic MR #:    [id number] Study Date: 03/03/2015 Gender:   F Age:    44 Height:   157.5 cm Weight:   106.1 kg BSA:    2.22 m^2 Pt. Status: Room:  [place] MD [place] MD PERFORMING  [facility], Outpatient SONOGRAPHER [facility], RDCS  cc:  ------------------------------------------------------------------- LV EF: 65% -  70%  ------------------------------------------------------------------- Indications:   Aortic Stenosis (I35.0).  ------------------------------------------------------------------- History:  PMH: Edema, Obesity. Chest pain. Dyspnea.  Coronary artery disease. Congestive heart failure. Aortic valve disease. Risk  factors: Family history of coronary artery disease. Hypertension. Diabetes mellitus. Dyslipidemia.  ------------------------------------------------------------------- Study Conclusions  - Left ventricle: The cavity size was normal. There was mild concentric hypertrophy. Systolic function was vigorous. The estimated ejection fraction was in the range of 65% to 70%. Wall motion was normal; there were no regional wall motion abnormalities. Doppler parameters are consistent with abnormal left ventricular relaxation (grade 1 diastolic dysfunction). - Aortic valve: Cusp separation was reduced. There was moderate to severe stenosis. Peak velocity (S): 393 cm/s. Mean gradient (S): 37 mm Hg. - Mitral valve: Calcified annulus. Valve area by continuity equation (using LVOT flow): 3.55 cm^2. - Left atrium: The atrium was mildly dilated.  Impressions:  - Compared to the prior study, there has been no significant interval change.  ------------------------------------------------------------------- Labs, prior tests, procedures, and surgery: Coronary artery bypass grafting.  Transthoracic echocardiography. M-mode, complete 2D, spectral Doppler, and color Doppler. Birthdate: Patient birthdate: 15-May-1928. Age: Patient is 82 yr old. Sex: Gender: female. BMI: 42.8 kg/m^2. Blood pressure:   164/68 Patient status: Outpatient. Study date: Study date: 03/03/2015. Study time: 01:36 PM. Location: Echo laboratory.  -------------------------------------------------------------------  ------------------------------------------------------------------- Left ventricle: The cavity size was normal. There was mild concentric hypertrophy. Systolic function was vigorous. The estimated ejection fraction was in the range of 65% to 70%. Wall motion was normal; there were no regional wall motion abnormalities. Doppler parameters are consistent with abnormal left ventricular  relaxation (grade 1 diastolic dysfunction).  ------------------------------------------------------------------- Aortic valve: Poorly visualized. Trileaflet; moderately thickened, moderately calcified leaflets. Cusp separation was reduced. Doppler:  There was moderate to severe stenosis.  There was no regurgitation.  VTI ratio of LVOT to aortic valve: 0.36. Valve area (VTI): 1.5 cm^2. Indexed valve area (VTI): 0.68 cm^2/m^2. Peak velocity ratio of LVOT to aortic valve: 0.3. Valve area (Vmax): 1.24 cm^2. Indexed valve area (Vmax): 0.56 cm^2/m^2. Mean velocity ratio of LVOT to aortic valve: 0.35. Valve area (Vmean): 1.46 cm^2. Indexed valve area (Vmean): 0.66 cm^2/m^2. Mean gradient (S): 37 mm Hg. Peak gradient (S): 62 mm Hg.  ------------------------------------------------------------------- Aorta: Aortic root: The aortic root was normal in size.  ------------------------------------------------------------------- Mitral valve:  Calcified annulus. Mobility was not restricted. Doppler: Transvalvular velocity was within the normal range. There was no evidence for stenosis. There was no regurgitation.  Valve area by pressure half-time: 3.01 cm^2. Indexed valve area by pressure half-time: 1.36 cm^2/m^2. Valve area by continuity equation (using LVOT flow): 3.55 cm^2. Indexed valve area by continuity equation (using LVOT flow): 1.6 cm^2/m^2.  Mean gradient (D): 3 mm Hg. Peak gradient (D): 2 mm Hg.  ------------------------------------------------------------------- Left atrium: LA Volume/BSA= 29.4 ml/m2. The atrium was mildly dilated.  ------------------------------------------------------------------- Right ventricle: The cavity size was normal. Wall thickness was normal. Systolic function was normal.  ------------------------------------------------------------------- Pulmonic valve:  Structurally normal valve.  Cusp separation was normal. Doppler: Transvalvular  velocity was within the normal range. There was no evidence for stenosis. There was no regurgitation.  ------------------------------------------------------------------- Tricuspid valve:  Structurally normal valve.  Doppler: Transvalvular velocity was within the normal range. There was mild regurgitation.  ------------------------------------------------------------------- Pulmonary artery:  The main pulmonary artery was normal-sized. Systolic pressure was within the normal range.  ------------------------------------------------------------------- Right atrium: The atrium was normal in size.  ------------------------------------------------------------------- Pericardium: There was no pericardial effusion.  ------------------------------------------------------------------- Systemic veins: Inferior vena cava: The vessel was normal in size. The respirophasic diameter changes were in the normal range (= 50%), consistent with normal central venous pressure.  Diameter: 9.88 mm.  ------------------------------------------------------------------- Measurements  IVC                    Value     Reference ID                    9.88 mm    ---------  Left ventricle              Value     Reference LV ID, ED, PLAX chordal      (L)   40.7 mm    43 - 52 LV ID, ES, PLAX chordal          25.4 mm    23 - 38 LV fx shortening, PLAX chordal      38  %    >=29 LV PW thickness, ED            13.1 mm    --------- IVS/LV PW ratio, ED            1.02      <=1.3 Stroke volume, 2D             111  ml    --------- Stroke volume/bsa, 2D           50  ml/m^2  --------- LV e&', lateral              5.81 cm/s   --------- LV E/e&', lateral             12.82     --------- LV e&', medial                5.37 cm/s   --------- LV E/e&', medial              13.87     --------- LV e&', average              5.59 cm/s   --------- LV E/e&', average             13.33     ---------  Ventricular septum            Value     Reference IVS thickness, ED             13.3 mm    ---------  LVOT                   Value     Reference LVOT ID, S                23  mm    --------- LVOT area                 4.15 cm^2   --------- LVOT peak velocity, S           117  cm/s   --------- LVOT mean velocity, S           81.5 cm/s   --------- LVOT VTI, S                26.8 cm    --------- LVOT peak gradient, S           5   mm Hg  ---------  Aortic valve               Value     Reference Aortic valve peak velocity, S       393  cm/s   --------- Aortic valve mean velocity, S  231  cm/s   --------- Aortic valve VTI, S            74.3 cm    --------- Aortic mean gradient, S          37  mm Hg  --------- Aortic peak gradient, S          62  mm Hg  --------- VTI ratio, LVOT/AV            0.36      --------- Aortic valve area, VTI          1.5  cm^2   --------- Aortic valve area/bsa, VTI        0.68 cm^2/m^2 --------- Velocity ratio, peak, LVOT/AV       0.3      --------- Aortic valve area, peak velocity     1.24 cm^2   --------- Aortic valve area/bsa, peak        0.56 cm^2/m^2 --------- velocity Velocity ratio, mean, LVOT/AV       0.35      --------- Aortic valve area, mean velocity     1.46 cm^2   --------- Aortic valve area/bsa, mean        0.66 cm^2/m^2  --------- velocity  Aorta                   Value     Reference Aortic root ID, ED            28  mm    ---------  Left atrium                Value     Reference LA ID, A-P, ES              41  mm    --------- LA ID/bsa, A-P              1.85 cm/m^2  <=2.2 LA volume, S               60  ml    --------- LA volume/bsa, S             27.1 ml/m^2  --------- LA volume, ES, 1-p A4C          41  ml    --------- LA volume/bsa, ES, 1-p A4C        18.5 ml/m^2  --------- LA volume, ES, 1-p A2C          77  ml    --------- LA volume/bsa, ES, 1-p A2C        34.8 ml/m^2  ---------  Mitral valve               Value     Reference Mitral E-wave peak velocity        74.5 cm/s   --------- Mitral A-wave peak velocity        12.6 cm/s   --------- Mitral mean velocity, D          73.7 cm/s   --------- Mitral deceleration time     (L)   148  ms    150 - 230 Mitral pressure half-time         73  ms    --------- Mitral mean gradient, D          3   mm Hg  --------- Mitral peak gradient, D          2   mm Hg  --------- Mitral E/A ratio, peak  5.91      --------- Mitral valve area, PHT, DP        3.01 cm^2   --------- Mitral valve area/bsa, PHT, DP      1.36 cm^2/m^2 --------- Mitral valve area, LVOT          3.55 cm^2   --------- continuity Mitral valve area/bsa, LVOT        1.6  cm^2/m^2 --------- continuity Mitral annulus VTI, D           31.3 cm    ---------  Tricuspid valve              Value     Reference Tricuspid regurg peak velocity      297  cm/s   --------- Tricuspid peak RV-RA gradient        35  mm Hg  ---------  Right ventricle              Value     Reference RV s&', lateral, S             14.3 cm/s   ---------  Legend: (L) and (H) mark values outside specified reference range.  ------------------------------------------------------------------- Prepared and Electronically Authenticated by  Candee Furbish, M.D. 2016-03-29T16:40:55      Cardiac Catheterization Operative Report  Katherine [name] [id number] 4/19/20167:31 AM Delphina Cahill, MD  Procedure Performed:  1. Left Heart Catheterization 2. Selective Coronary Angiography 3. Saphenous vein graft angiography 4. LIMA graft angiography  Operator: Lauree Chandler, MD  Indication: 81 yo female with history of severe aortic valve stenosis, CAD s/p 4V CABG in 2003, DM, HTN, hyperlipidemia, chronic diastolic CHF, obesity who is here today for right and left heart cath. I saw her in the valve clinic last week for discussion regarding her severe aortic valve stenosis. She is followed by Dr. Debara Pickett. She has known CAD and underwent 4 vessel CABG in 2003. Last cath was before her bypass. Normal stress myoview January 2015. She has diabetes managed with insulin and oral therapy. She has been followed for the last few years by Dr. Debara Pickett for moderate aortic valve stenosis. She was last seen in the office by Dr. Debara Pickett on 02/20/15 and c/o more shortness of breath. She has chronic diastolic heart failure with chronic lower extremity edema that has been managed effectively with Lasix. Echo 03/03/15 with normal LV systolic function, [id number]. There was mild concentric hypertrophy and grade 1 diastolic dysfunction. The aortic valve is heavily calcified with poor leaflet excursion. Mean gradient across the aortic valve was 37 mmHg and peak gradient was 62 mm Hg. Aortic valve area estimated at 1.46 cm2. She is limited by dyspnea with minimal exertion and fatigue. She has no energy.  She has rare chest pains. She has no exertional chest pain. She walks with a cane. She denies orthopnea or PND. She has mild lower extremity edema. Weight has been overall stable for the last 4 months. She uses Lasix every day.  Cardiac cath today as part of the planning process for possible TAVR.   Procedure Details: The risks, benefits, complications, treatment options, and expected outcomes were discussed with the patient. The patient and/or family concurred with the proposed plan, giving informed consent. The patient was brought to the cath lab after IV hydration was begun and oral premedication was given. The patient was not sedated. The right groin was prepped and draped in the usual manner. Using the modified Seldinger access technique, a 5 French sheath was placed in the right femoral  artery. A 7 French sheath was placed in the right femoral vein. A right heart cath was performed with a balloon tipped catheter. Standard diagnostic catheters were used to perform selective coronary angiography. I engaged the left main with a JL4 catheter. The RCA, SVG to Diagonal, SVG to OM, LIMA to LAD were all engaged with a JR4 catheter. The SVG to PDA was engaged with a RCB catheter. I crossed the aortic valve with a straight wire using an AL-1 catheter.  There were no immediate complications. The patient was taken to the recovery area in stable condition.   Hemodynamic Findings: Central aortic pressure: 188/95 Left ventricular pressure: 205/24/28 Right atrial pressure: 5 Right ventricular pressure: 46/2/5 Pulmonary artery pressure: 45/9 (mean 22) Pulmonary capillary wedge pressure: 12 Cardiac output (Fick): 5.9 L/min Cardiac index (Fick): 2.9 L/min/m2 Central aortic saturation: 96% Pulmonary artery saturation: 72%  Aortic valve data:  Mean gradient: 13.1 mmHg Peak to peak gradient: 17.0 mmHg AVA estimated 2.0 cm2  Angiographic Findings:  Left main: Diffuse 20% stenosis.   Left Anterior  Descending Artery: Large caliber vessel that courses to the apex. The mid vessel has diffuse 60% stenosis followed by sub-total occlusion. The mid and distal vessel fills from the patent IMA graft. The diagonal branch is occluded and fills from the patent vein graft.   Circumflex Artery: Large caliber vessel with early intermediate branch, moderate obtuse marginal branch. The intermediate branch is a moderate caliber vessel with ostial 60% stenosis. The proximal Circumflex is calcified proximally with diffuse 20% stenosis. The mid vessel has a 90% stenosis just before the takeoff of the obtuse marginal branch.   Right Coronary Artery: Large dominant vessel with 100% mid occlusion. The mid and distal vessel and distal branches fill from the patent vein graft.   Graft Anatomy:  SVG to PDA: Patent  SVG to Diagonal: Patent SVG to Circumflex: Patent LIMA to LAD: Patent  Left Ventricular Angiogram: Deferred  Impression: 1. Severe CAD s/p 4 CABG with 4/4 patent bypass grafts 2. Severe aortic valve stenosis with heavily calcified valve on echo with poor leaflet mobility. The pressure data from the cath does not correlate with the data from the echo but based on the appearance of the valve on echo and the difficulty crossing the valve, I think she has severe aortic stenosis.   Recommendations: Will continue workup for TAVR. She will be referred to see Dr. Servando Snare for surgical opinion.    Complications: None. The patient tolerated the procedure well.     ADDENDUM REPORT: 04/14/2015 15:35  CLINICAL DATA: Aortic stenosis  EXAM: Cardiac TAVR CT  TECHNIQUE: The patient was scanned on a Philips 256 scanner. A 120 kV retrospective scan was triggered in the descending thoracic aorta at 111 HU's. Gantry rotation speed was 270 msecs and collimation was .9 mm. No beta blockade or nitro were given. The 3D data set was reconstructed in 5% intervals of the R-R cycle.  Systolic and diastolic phases were analyzed on a dedicated work station using MPR, MIP and VRT modes. The patient received 80 cc of contrast.  FINDINGS: Aortic Valve: Tri leaflet moderately calcified leaflets with restricted motion. Heavy calcification if the sinotubular junction and superior aspect of the sinuses. Annulus with no calcification  Aorta: Calcification of the arch and descending thoracic aorta. Bovine Arch. No aneurysm or bulky mural debris.  Sinotubular Junction: 25 mm  Ascending Thoracic Aorta: 29.5 mm  Descending Thoracic Aorta: 27 mm  Sinus of Valsalva Measurements:  Non-coronary: 27 mm  Right -coronary: 26 mm  Left -coronary: 29 mm  Coronary Artery Height above Annulus:  Left Main: 12.3 mm  Right Coronary: 10.8 mm  Virtual Basal Annulus Measurements:  Maximum/Minimum Diameter: 24.4 mm x 20.8 mm  Perimeter: 71.6 mm  Area: 391 mm2  Coronary Arteries: Patient SVG to PDA, Patent SVG OM, Patent SVG diagonal and patent LIMA to LAD  Optimum Fluoroscopic Angle for Delivery: RAO 6 degrees Cranial 0 degrees  IMPRESSION: 1) Stenotic trileaflet aortic valve Annulus 391 mm2 suitable for a 23 mm Sapien 3 valve  2) Suitable coronary heights for deployment with patent SVG;s x 3 and patent LIMA  3) Heavily calcified STJ over 50% of circumference  4) No LAA thrombus  5) Optimum angiographic angle for delivery RAO 6 degrees Cranial 0 degrees  Jenkins Rouge   Electronically Signed  By: Jenkins Rouge M.D.  On: 04/14/2015 15:35      Study Result     EXAM: OVER-READ INTERPRETATION CT CHEST  The following report is an over-read performed by radiologist Dr. Julian Hy of [facility] on 04/14/2015. This over-read does not include interpretation of cardiac or coronary anatomy or pathology. The coronary CTA interpretation by the cardiologist is attached.  COMPARISON: CT chest dated  04/10/2015.  FINDINGS: Scattered small pulmonary nodules, including a 4 mm subpleural nodule in the right middle lobe (series 412/image 40), a 3 mm subpleural nodule in the right upper lobe along the major fissure (series 412/ image 42), and a 2 mm subpleural nodule in the anterior right middle lobe (series 412/ image 51). These are better evaluated on dedicated CTA chest.  Dependent atelectasis in the right lower lobe. No pleural effusion or pneumothorax.  Cardiomegaly. No pericardial effusion. Coronary atherosclerosis. Postsurgical changes related to prior CABG. Atherosclerotic calcifications of the aortic arch.  No suspicious mediastinal, hilar, or axillary lymphadenopathy.  Visualized left thyroid is nodular/heterogeneous.  Degenerative changes of the thoracic spine. Median sternotomy.  Visualized upper abdomen is unremarkable.  IMPRESSION: Scattered small right pulmonary nodules measuring up to 4 mm, better evaluated on recent dedicated CTA chest.  Otherwise, no significant extracardiac findings.  Electronically Signed: By: Julian Hy M.D. On: 04/14/2015 12:05     CLINICAL DATA: 81 year old female with history of severe aortic stenosis. Preprocedural study prior to potential transcatheter aortic valve replacement (TAVR).  EXAM: CT ANGIOGRAPHY CHEST, ABDOMEN AND PELVIS  TECHNIQUE: Multidetector CT imaging through the chest, abdomen and pelvis was performed using the standard protocol during bolus administration of intravenous contrast. Multiplanar reconstructed images and MIPs were obtained and reviewed to evaluate the vascular anatomy.  CONTRAST: 183mL OMNIPAQUE IOHEXOL 350 MG/ML SOLN  COMPARISON: CT of the abdomen and pelvis 06/03/2014. Chest CT 12/22/2009.  FINDINGS: CTA CHEST FINDINGS  Mediastinum/Lymph Nodes: Heart size is mildly enlarged. There is no significant pericardial fluid, thickening or pericardial calcification.  There is atherosclerosis of the thoracic aorta, the great vessels of the mediastinum and the coronary arteries, including calcified atherosclerotic plaque in the left main, left anterior descending, left circumflex and right coronary arteries. Status post median sternotomy for CABG, including LIMA to the LAD. Moderately calcified aortic valve. No pathologically enlarged mediastinal or hilar lymph nodes. Esophagus is unremarkable in appearance. No axillary lymphadenopathy.  Lungs/Pleura: 4 mm subpleural nodule in the periphery of the right lower lobe (image 68 of series 5). No acute consolidative airspace disease. No pleural effusions. Mild subsegmental atelectasis in the right lower lobe.  Musculoskeletal/Soft Tissues: Median sternotomy wires. There are no aggressive appearing lytic or  blastic lesions noted in the visualized portions of the skeleton.  CTA ABDOMEN AND PELVIS FINDINGS  Hepatobiliary: No cystic or solid hepatic lesions. No intra or extrahepatic biliary ductal dilatation. Gallbladder is unremarkable in appearance.  Pancreas: No pancreatic mass. No pancreatic ductal dilatation. No pancreatic or peripancreatic fluid collections or inflammatory changes.  Spleen: Unremarkable.  Adrenals/Urinary Tract: Bilateral adrenal glands are normal in appearance. In the posterior aspect of the interpolar region of the right kidney there is a exophytic 3.5 x 3.3 x 3.7 cm heterogeneously enhancing lesion extending inferiorly, which is only slightly enlarged compared to remote prior studies dating back to 11/13/2008. This is completely separate from the right renal vein, which is grossly patent. There are multiple low-attenuation lesions in the kidneys bilaterally, many of which are too small to definitively characterize. The larger of these lesions are all compatible with simple cysts, with the largest of these measuring 3.4 cm interpolar region of the right kidney. Focal  area of dystrophic calcification in the upper pole of the left kidney also noted, similar compared to remote prior examinations dating back to 11/13/2008. No hydroureteronephrosis. Urinary bladder is normal in appearance.  Stomach/Bowel: The appearance of the stomach is normal. No pathologic dilatation of small bowel or colon. There is a short segment of distal ileum which extends into and umbilical hernia, without signs of incarceration or obstruction at this time.  Vascular/Lymphatic: Vascular findings and measurements pertinent to potential TAVR procedure, as detailed below. No lymphadenopathy noted in the abdomen or pelvis.  Reproductive: The uterus is heterogeneous in the enlarged with multiple densely calcified lesions, most compatible with fibroids. Ovaries are atrophic.  Other: No significant volume of ascites. No pneumoperitoneum.  Musculoskeletal: Old L1 vertebral body compression fracture with approximately 20% loss of anterior vertebral body height. 5 mm of anterolisthesis of L4 upon L5. There are no aggressive appearing lytic or blastic lesions noted in the visualized portions of the skeleton.  VASCULAR MEASUREMENTS PERTINENT TO TAVR:  AORTA:  Minimal Aortic Diameter - 12 x 12 mm  Severity of Aortic Calcification - severe  RIGHT PELVIS:  Right Common Iliac Artery -  Minimal Diameter - 7.1 x 9.5 mm  Tortuosity - mild  Calcification - moderate-to-severe  Right External Iliac Artery -  Minimal Diameter - 7.5 x 7.4 mm  Tortuosity - mild  Calcification - mild  Right Common Femoral Artery -  Minimal Diameter - 8.1 x 3.8 mm  Tortuosity - mild  Calcification - moderate  LEFT PELVIS:  Left Common Iliac Artery -  Minimal Diameter - 9.6 x 8.8 mm  Tortuosity - mild  Calcification - moderate  Left External Iliac Artery -  Minimal Diameter - 6.6 x 7.4 mm  Tortuosity - mild  Calcification - mild  Left  Common Femoral Artery -  Minimal Diameter - 7.0 x 6.2 mm  Tortuosity - mild  Calcification - moderate  Review of the MIP images confirms the above findings.  IMPRESSION: 1. Vascular findings and measurements pertinent to potential TAVR procedure, as detailed above. This patient does appear to have suitable pelvic arterial access, particularly on the left side. 2. Exophytic enhancing lesion in the interpolar region of the right kidney, currently measuring 3.5 x 3.3 x 3.7 cm, only slightly increased in size compared to prior examinations. Given the extremely slow interval growth compared to prior studies, the possibility of a benign lesion such as an oncocytoma should be considered, however, the possibility of a renal cell carcinoma remains of concern. Urologic consultation  should be considered if not recently obtained. 3. 4 mm subpleural nodule in the periphery of the right lower lobe is highly nonspecific and may simply represent a subpleural lymph node. If the patient is at high risk for bronchogenic carcinoma, follow-up chest CT at 1year is recommended. If the patient is at low risk, no follow-up is needed. This recommendation follows the consensus statement: Guidelines for Management of Small Pulmonary Nodules Detected on CT Scans: A Statement from the [employer] as published in Radiology 2005; 237:395-400. 4. Additional incidental findings, as above.   Electronically Signed  By: Vinnie Langton M.D.  On: 04/10/2015 16:19      STS Risk Score Risk of Mortality: 5.768%  Morbidity or Mortality: 29.913%  Long Length of Stay: 17.651%  Short Length of Stay: 10.294%  Permanent Stroke: 3.084%  Prolonged Ventilation: 23.36%  DSW Infection: 0.391%  Renal Failure: 12.59%  Reoperation: 9.748%    Physical Therapy Evaluation  Patient Details  Name: Katherine [name] MRN: [id number] Date of Birth: 08/11/1928 Referring Provider: Gaye Pollack, MD  Encounter Date: 04/10/2015      PT End of Session - 04/10/15 0854    Visit Number 1   PT Start Time [id number]   PT Stop Time 0940   PT Time Calculation (min) 46 min   Activity Tolerance Patient tolerated treatment well      Past Medical History  Diagnosis Date  . Gastric mass   . Hernia of unspecified site of abdominal cavity without mention of obstruction or gangrene     hiatal  . Gastric ulcer   . Thyroid mass   . GERD (gastroesophageal reflux disease)   . Anxiety   . Coronary artery disease   . Osteoarthritis   . Anemia   . Hypertension   . Hyperlipidemia   . Diabetes mellitus, type 2   . Aortic valvular stenosis     moderate to severe EF on 2D Echo 10/22/2012 65-70% valve area of 1.2cm, and peak and mean gradients of 45 and 43mmHg  . CHF (congestive heart failure)   . Dyspnea     chronic  . Easy fatigability   . Normal cardiac stress test 12/2013    normal stress nuclear study    Past Surgical History  Procedure Laterality Date  . Coronary artery bypass graft      x4 in 2003  . Tubal ligation    . Knee surgery Right   . Uterine polypectomy    . Internal hemorrhoidectomy    . Cataract extraction Bilateral   . Left and right heart catheterization with coronary/graft angiogram N/A 03/24/2015    Procedure: LEFT AND RIGHT HEART CATHETERIZATION WITH Beatrix Fetters; Surgeon: Burnell Blanks, MD; Location: [facility] CATH LAB; Service: Cardiovascular; Laterality: N/A;    There were no vitals filed for this visit.  Visit Diagnosis: Difficulty walking - Plan: PT plan of care cert/re-cert  Generalized weakness - Plan: PT plan of care cert/re-cert  Decreased ROM of right shoulder - Plan: PT plan of care cert/re-cert  Decreased ROM of left shoulder - Plan: PT plan of care cert/re-cert      Subjective Assessment  - 04/10/15 0859    Subjective dx with aortic stenosis about 6 years ago but remained stable, progressed over last 2 years, and pt really noticed effects over this past year   Pertinent History CABGx4 13 years ago   Patient Stated Goals complete as part of pre-TAVR eval   Currently in Pain? No/denies  [facility] PT Assessment - 04/10/15 0001    Assessment   Medical Diagnosis severe aortic stenosis   Onset Date 04/09/14  approximate   Precautions   Precautions None   Restrictions   Weight Bearing Restrictions No   Balance Screen   Has the patient fallen in the past 6 months No   Has the patient had a decrease in activity level because of a fear of falling?  No   Is the patient reluctant to leave their home because of a fear of falling?  No   Home Environment   Living Enviornment Private residence   [place] entrance   [place] Two level  pt resides on main level   Prior Function   Level of Independence Independent with basic ADLs  now getting assist from CNA   Posture/Postural Control   Posture/Postural Control Postural limitations   Postural Limitations Forward head;Rounded Shoulders   ROM / Strength   AROM / PROM / Strength AROM;Strength   AROM   Overall AROM Comments Shoulder flexion limited bil 85-90 degrees with R more limited than left, otherwise [facility]   Strength   Overall Strength Comments LE grossly 4/5, UE grossly 3/5   Strength Assessment Site Hand   Right/Left hand Right;Left   Grip (lbs) 21  R hand dominant   Grip (lbs) 18  L   Ambulation/Gait   Ambulation/Gait Yes   Ambulation/Gait Assistance 5: Supervision   Assistive device Straight cane   Gait Pattern Decreased step length - right;Decreased step length - left;Wide base of support          [facility] Pre-Surgical Assessment - 04/10/15 0001     5 Meter Walk Test- trial 1 37.4 sec  tested once due ot slow speed (>6 sec)   Timed Up & Go Test trial  61.3 sec.   Comments >/= 12 sec indicates increased fall risk and >/= 30 seconds indicates difficulties with ADL's   4 Stage Balance Test tolerated for:  10 sec.   4 Stage Balance Test Position 2   comment unable to assume position 3   Comment unable to arise without UE support   ADL/IADL Needs Assistance with: Bathing;Dressing;Meal prep;Finances;Yard work   ADL/IADL Therapist, sports Index Midly frail   6 Minute Walk- Baseline yes   BP (mmHg) 150/72 mmHg   HR (bpm) 65   02 Sat (%RA) 93 %   Modified Borg Scale for Dyspnea 2- Mild shortness of breath   Perceived Rate of Exertion (Borg) 6-   6 Minute Walk Post Test yes   BP (mmHg) 152/78 mmHg   HR (bpm) 87   02 Sat (%RA) 93 %   Modified Borg Scale for Dyspnea 3- Moderate shortness of breath or breathing difficulty   Perceived Rate of Exertion (Borg) 17- Very hard   Aerobic Endurance Distance Walked 112   Endurance additional comments Pt took a few short standing rest breaks. She moves very slowly.                          PT Education - 04/10/15 [phone]    Education provided Yes   Education Details fall risk and benefits of use of RW   Person(s) Educated Patient;Child(ren)   Methods Explanation   Comprehension Verbalized understanding                    Plan - 04/10/15 0958    Clinical Impression  Statement Pt is an 81 yo female presenting to OP PT for evaluation prior to possible TAVR surgery for severe aortic stenosis. Pt's overall mobility is slow and limited. Pt is currently requiring assist with bathing. She reports she can dress herself but takes >1 hour. Pt is at a high fall risk and could benefit from use of front wheeled RW. Discussed with pt/daughter and they want to wait until after surgery to see how  she is doing. Pt's LE strength is limited and pt requires UE support to stand from chair. Pt's UE ROM and strength are limited.   PT Frequency One time visit   Consulted and Agree with Plan of Care Patient;Family member/caregiver          G-Codes - 2015-04-26 1023    Functional Assessment Tool Used 6 minute walk 112'   Functional Limitation Mobility: Walking and moving around   Mobility: Walking and Moving Around Current Status [phone]) At least 80 percent but less than 100 percent impaired, limited or restricted   Mobility: Walking and Moving Around Goal Status [phone]) At least 80 percent but less than 100 percent impaired, limited or restricted   Mobility: Walking and Moving Around Discharge Status [phone]) At least 80 percent but less than 100 percent impaired, limited or restricted       Problem List Patient Active Problem List   Diagnosis Date Noted  . Bilateral leg edema 10/17/2014  . Acute diastolic congestive heart failure, NYHA class 3 10/17/2014  . CHF with left ventricular diastolic dysfunction, NYHA class 2 09/09/2014  . Chest pain 12/04/2013  . Difficulty walking 10/28/2013  . Muscle weakness (generalized) 10/28/2013  . Morbid obesity 10/24/2013  . Moderate to severe aortic stenosis 04/22/2013  . S/P CABG x 4 04/22/2013  . Gastric mass 08/18/2011  . Diabetes mellitus, type 2   . CHF 03/04/2010  . COLONIC POLYPS, HX OF 03/04/2010  . THYROID MASS 02/25/2010  . DIABETES MELLITUS, TYPE II 02/25/2010  . HYPERLIPIDEMIA 02/25/2010  . UNSPECIFIED ANEMIA 02/25/2010  . ANXIETY 02/25/2010  . Essential hypertension 02/25/2010  . Coronary atherosclerosis 02/25/2010  . REFLUX ESOPHAGITIS 02/25/2010  . HIATAL HERNIA 02/25/2010  . OSTEOARTHRITIS 02/25/2010  . GASTRIC ULCER, HX OF 02/25/2010    Romualdo Bolk, PT 2015-04-26, 10:28 AM  [facility] [address] [place], [place], [address] Phone: [phone] Fax: [phone]      Impression:  She has stage D severe symptomatic aortic stenosis with NYHA functional class III symptoms of shortness of breath and fatigue with minimal exertion such as walking around inside her house. I have personally reviewed her echo, cath, and CTA exams. Her aortic valve leaflets are heavily calcified with poor leaflet mobility. The mean gradient is 37 mm Hg and has progressed significantly over the past year and a half. Her aortic valve gradients at cath do not correlate with this but are sometimes inaccurate and the valve clearly looks severely stenotic on echo. She has patent grafts and no areas of coronary ischemia with normal LV function. Her PFT's only show mild restriction but are not reliable due to weak effort. She has clearly had progressive symptoms of exertional shortness of breath and fatigue over the past six months that I can't explain by anything other than her progressive aortic stenosis. She is fairly limited due to her obesity and degenerative arthritis and walks very slowly with a cane. Her 5 meter walk took 37 sec. I suspect that her congestive heart failure symptoms  are significantly complicating her baseline decreased mobility. She is in fairly decent medical condition overall for her age with normal coronary perfusion and LV function post-CABG in 2003, normal renal function, good nutritional status, and has never smoked. I think it is worthwhile treating her aortic stenosis now before she has any further deterioration. I agree with Dr. Servando Snare that she is not a candidate for redo sternotomy and AVR. I think TAVR would be a reasonable option and her CT scans show that she would be a candidate for a 23 mm Sapien 3 valve via a transfemoral approach. She and her daughters would like to proceed with TAVR.   Following the decision to proceed with transcatheter aortic valve  replacement, a discussion has been held with the patient and 2 of her daughters regarding what types of management strategies would be attempted intraoperatively in the event of life-threatening complications, including whether or not the patient would be considered a candidate for the use of cardiopulmonary bypass and/or conversion to open sternotomy for attempted surgical intervention.  The patient has been advised of a variety of complications that might develop including but not limited to risks of death, stroke, paravalvular leak, aortic dissection or other major vascular complications, aortic annulus rupture, device embolization, cardiac rupture or perforation, mitral regurgitation, acute myocardial infarction, arrhythmia, heart block or bradycardia requiring permanent pacemaker placement, congestive heart failure, respiratory failure, renal failure, pneumonia, infection, other late complications related to structural valve deterioration or migration, or other complications that might ultimately cause a temporary or permanent loss of functional independence or other long term morbidity.  The patient provides full informed consent for the procedure as described and all questions were answered.    Plan:  Transfemoral TAVR with a 23 mm Sapien 3 valve on 05/26/2015.    Gaye Pollack, MD

## 2015-04-17 ENCOUNTER — Other Ambulatory Visit: Payer: Self-pay | Admitting: *Deleted

## 2015-04-17 DIAGNOSIS — I350 Nonrheumatic aortic (valve) stenosis: Secondary | ICD-10-CM

## 2015-04-26 ENCOUNTER — Other Ambulatory Visit: Payer: Self-pay | Admitting: Cardiology

## 2015-04-27 NOTE — Telephone Encounter (Signed)
Rx(s) sent to pharmacy electronically.  

## 2015-04-30 DIAGNOSIS — R609 Edema, unspecified: Secondary | ICD-10-CM | POA: Diagnosis not present

## 2015-05-11 DIAGNOSIS — H00013 Hordeolum externum right eye, unspecified eyelid: Secondary | ICD-10-CM | POA: Diagnosis not present

## 2015-05-20 ENCOUNTER — Encounter: Payer: Self-pay | Admitting: Surgery

## 2015-05-20 ENCOUNTER — Ambulatory Visit (INDEPENDENT_AMBULATORY_CARE_PROVIDER_SITE_OTHER): Payer: Medicare Other | Admitting: Surgery

## 2015-05-20 VITALS — BP 185/82 | HR 74 | Resp 20 | Ht 62.0 in | Wt 234.0 lb

## 2015-05-20 DIAGNOSIS — I2510 Atherosclerotic heart disease of native coronary artery without angina pectoris: Secondary | ICD-10-CM

## 2015-05-20 DIAGNOSIS — I350 Nonrheumatic aortic (valve) stenosis: Secondary | ICD-10-CM

## 2015-05-21 ENCOUNTER — Encounter: Payer: Self-pay | Admitting: Surgery

## 2015-05-21 NOTE — Progress Notes (Signed)
HPI:  The patient returns today to review the CT scans done for [name] workup and to be sure that nothing has changed significantly since I last saw her on 04/16/2015. She has been feeling about the same with marked tiredness and exertional fatigue. Her daughter says that she has been walking around her house but can't do much of anything without exhaustion.  Current Outpatient Prescriptions  Medication Sig Dispense Refill  . acetaminophen (TYLENOL) 500 MG tablet Take 1,000 mg by mouth 2 (two) times daily. Take 1000 mg by mouth in the morning and take 1000 mg by mouth in the evening.    Marland Kitchen ALPRAZolam (XANAX) 0.25 MG tablet Take 0.125-0.25 tablets by mouth 2 (two) times daily as needed for anxiety or sleep.     . fexofenadine (ALLEGRA) 180 MG tablet Take 180 mg by mouth daily.    . furosemide (LASIX) 20 MG tablet TAKE 2 TABLETS BY MOUTH DAILY 60 tablet 9  . HYDROcodone-acetaminophen (NORCO) 5-325 MG per tablet Take 1 tablet by mouth daily as needed for moderate pain.     Marland Kitchen insulin detemir (LEVEMIR FLEXPEN) 100 UNIT/ML injection Inject 48 Units into the skin 2 (two) times daily.     . Ipratropium-Albuterol (COMBIVENT RESPIMAT) 20-100 MCG/ACT AERS respimat Inhale 2 puffs into the lungs every 6 (six) hours as needed for wheezing or shortness of breath. 1 Inhaler 2  . Linaclotide (LINZESS) 145 MCG CAPS capsule Take 145 mcg by mouth daily as needed (for constipation).    Marland Kitchen loperamide (IMODIUM) 2 MG capsule Take 2 mg by mouth as needed for diarrhea or loose stools.    . Memantine HCl ER (NAMENDA XR) 28 MG CP24 Take 28 mg by mouth every morning.    . metFORMIN (GLUCOPHAGE-XR) 500 MG 24 hr tablet Take 500 mg by mouth daily with breakfast.     . nebivolol (BYSTOLIC) 10 MG tablet Take 10 mg by mouth every morning.    Marland Kitchen NEXIUM 20 MG capsule Take 20 mg by mouth daily.     . NON FORMULARY Apply 1-2 application topically 4 (four) times daily. DIC 3 % / BAC 2 % / GAB 5 % / LID-PRIL 2.5 % - 2.5 % / MENT  1 %    . potassium chloride SA (K-DUR,KLOR-CON) 20 MEQ tablet Take 20 mEq by mouth daily.    . rosuvastatin (CRESTOR) 20 MG tablet Take 20 mg by mouth at bedtime.     . VOLTAREN 1 % GEL Apply 2 g topically daily as needed (pain).      No current facility-administered medications for this visit.     Physical Exam: BP 185/82 mmHg  Pulse 74  Resp 20  Ht 5\' 2"  (1.575 m)  Wt 234 lb (106.142 kg)  BMI 42.79 kg/m2  SpO2 94% Obese, elderly woman in no distress in a wheel chair Lungs are clear Cardiac exam shows a regular rate and rhythm with a 3/6 systolic murmur along the RSB. There is mild lower leg edema  Diagnostic Tests:  ADDENDUM REPORT: 04/14/2015 15:35  CLINICAL DATA: Aortic stenosis  EXAM: Cardiac TAVR CT  TECHNIQUE: The patient was scanned on a Philips 256 scanner. A 120 kV retrospective scan was triggered in the descending thoracic aorta at 111 HU's. Gantry rotation speed was 270 msecs and collimation was .9 mm. No beta blockade or nitro were given. The 3D data set was reconstructed in 5% intervals of the R-R cycle. Systolic and diastolic phases were analyzed  on a dedicated work station using MPR, MIP and VRT modes. The patient received 80 cc of contrast.  FINDINGS: Aortic Valve: Tri leaflet moderately calcified leaflets with restricted motion. Heavy calcification if the sinotubular junction and superior aspect of the sinuses. Annulus with no calcification  Aorta: Calcification of the arch and descending thoracic aorta. Bovine Arch. No aneurysm or bulky mural debris.  Sinotubular Junction: 25 mm  Ascending Thoracic Aorta: 29.5 mm  Descending Thoracic Aorta: 27 mm  Sinus of Valsalva Measurements:  Non-coronary: 27 mm  Right -coronary: 26 mm  Left -coronary: 29 mm  Coronary Artery Height above Annulus:  Left Main: 12.3 mm  Right Coronary: 10.8 mm  Virtual Basal Annulus Measurements:  Maximum/Minimum Diameter: 24.4 mm x  20.8 mm  Perimeter: 71.6 mm  Area: 391 mm2  Coronary Arteries: Patient SVG to PDA, Patent SVG OM, Patent SVG diagonal and patent LIMA to LAD  Optimum Fluoroscopic Angle for Delivery: RAO 6 degrees Cranial 0 degrees  IMPRESSION: 1) Stenotic trileaflet aortic valve Annulus 391 mm2 suitable for a 23 mm Sapien 3 valve  2) Suitable coronary heights for deployment with patent SVG;s x 3 and patent LIMA  3) Heavily calcified STJ over 50% of circumference  4) No LAA thrombus  5) Optimum angiographic angle for delivery RAO 6 degrees Cranial 0 degrees  Jenkins Rouge   Electronically Signed  By: Jenkins Rouge M.D.  On: 04/14/2015 15:35      Study Result     EXAM: OVER-READ INTERPRETATION CT CHEST  The following report is an over-read performed by radiologist Dr. Julian Hy of [facility] on 04/14/2015. This over-read does not include interpretation of cardiac or coronary anatomy or pathology. The coronary CTA interpretation by the cardiologist is attached.  COMPARISON: CT chest dated 04/10/2015.  FINDINGS: Scattered small pulmonary nodules, including a 4 mm subpleural nodule in the right middle lobe (series 412/image 40), a 3 mm subpleural nodule in the right upper lobe along the major fissure (series 412/ image 42), and a 2 mm subpleural nodule in the anterior right middle lobe (series 412/ image 51). These are better evaluated on dedicated CTA chest.  Dependent atelectasis in the right lower lobe. No pleural effusion or pneumothorax.  Cardiomegaly. No pericardial effusion. Coronary atherosclerosis. Postsurgical changes related to prior CABG. Atherosclerotic calcifications of the aortic arch.  No suspicious mediastinal, hilar, or axillary lymphadenopathy.  Visualized left thyroid is nodular/heterogeneous.  Degenerative changes of the thoracic spine. Median sternotomy.  Visualized upper abdomen is  unremarkable.  IMPRESSION: Scattered small right pulmonary nodules measuring up to 4 mm, better evaluated on recent dedicated CTA chest.  Otherwise, no significant extracardiac findings.  Electronically Signed: By: Julian Hy M.D. On: 04/14/2015 12:05    CLINICAL DATA: 81 year old female with history of severe aortic stenosis. Preprocedural study prior to potential transcatheter aortic valve replacement (TAVR).  EXAM: CT ANGIOGRAPHY CHEST, ABDOMEN AND PELVIS  TECHNIQUE: Multidetector CT imaging through the chest, abdomen and pelvis was performed using the standard protocol during bolus administration of intravenous contrast. Multiplanar reconstructed images and MIPs were obtained and reviewed to evaluate the vascular anatomy.  CONTRAST: 132mL OMNIPAQUE IOHEXOL 350 MG/ML SOLN  COMPARISON: CT of the abdomen and pelvis 06/03/2014. Chest CT 12/22/2009.  FINDINGS: CTA CHEST FINDINGS  Mediastinum/Lymph Nodes: Heart size is mildly enlarged. There is no significant pericardial fluid, thickening or pericardial calcification. There is atherosclerosis of the thoracic aorta, the great vessels of the mediastinum and the coronary arteries, including calcified atherosclerotic plaque in the  left main, left anterior descending, left circumflex and right coronary arteries. Status post median sternotomy for CABG, including LIMA to the LAD. Moderately calcified aortic valve. No pathologically enlarged mediastinal or hilar lymph nodes. Esophagus is unremarkable in appearance. No axillary lymphadenopathy.  Lungs/Pleura: 4 mm subpleural nodule in the periphery of the right lower lobe (image 68 of series 5). No acute consolidative airspace disease. No pleural effusions. Mild subsegmental atelectasis in the right lower lobe.  Musculoskeletal/Soft Tissues: Median sternotomy wires. There are no aggressive appearing lytic or blastic lesions noted in the visualized  portions of the skeleton.  CTA ABDOMEN AND PELVIS FINDINGS  Hepatobiliary: No cystic or solid hepatic lesions. No intra or extrahepatic biliary ductal dilatation. Gallbladder is unremarkable in appearance.  Pancreas: No pancreatic mass. No pancreatic ductal dilatation. No pancreatic or peripancreatic fluid collections or inflammatory changes.  Spleen: Unremarkable.  Adrenals/Urinary Tract: Bilateral adrenal glands are normal in appearance. In the posterior aspect of the interpolar region of the right kidney there is a exophytic 3.5 x 3.3 x 3.7 cm heterogeneously enhancing lesion extending inferiorly, which is only slightly enlarged compared to remote prior studies dating back to 11/13/2008. This is completely separate from the right renal vein, which is grossly patent. There are multiple low-attenuation lesions in the kidneys bilaterally, many of which are too small to definitively characterize. The larger of these lesions are all compatible with simple cysts, with the largest of these measuring 3.4 cm interpolar region of the right kidney. Focal area of dystrophic calcification in the upper pole of the left kidney also noted, similar compared to remote prior examinations dating back to 11/13/2008. No hydroureteronephrosis. Urinary bladder is normal in appearance.  Stomach/Bowel: The appearance of the stomach is normal. No pathologic dilatation of small bowel or colon. There is a short segment of distal ileum which extends into and umbilical hernia, without signs of incarceration or obstruction at this time.  Vascular/Lymphatic: Vascular findings and measurements pertinent to potential TAVR procedure, as detailed below. No lymphadenopathy noted in the abdomen or pelvis.  Reproductive: The uterus is heterogeneous in the enlarged with multiple densely calcified lesions, most compatible with fibroids. Ovaries are atrophic.  Other: No significant volume of ascites. No  pneumoperitoneum.  Musculoskeletal: Old L1 vertebral body compression fracture with approximately 20% loss of anterior vertebral body height. 5 mm of anterolisthesis of L4 upon L5. There are no aggressive appearing lytic or blastic lesions noted in the visualized portions of the skeleton.  VASCULAR MEASUREMENTS PERTINENT TO TAVR:  AORTA:  Minimal Aortic Diameter - 12 x 12 mm  Severity of Aortic Calcification - severe  RIGHT PELVIS:  Right Common Iliac Artery -  Minimal Diameter - 7.1 x 9.5 mm  Tortuosity - mild  Calcification - moderate-to-severe  Right External Iliac Artery -  Minimal Diameter - 7.5 x 7.4 mm  Tortuosity - mild  Calcification - mild  Right Common Femoral Artery -  Minimal Diameter - 8.1 x 3.8 mm  Tortuosity - mild  Calcification - moderate  LEFT PELVIS:  Left Common Iliac Artery -  Minimal Diameter - 9.6 x 8.8 mm  Tortuosity - mild  Calcification - moderate  Left External Iliac Artery -  Minimal Diameter - 6.6 x 7.4 mm  Tortuosity - mild  Calcification - mild  Left Common Femoral Artery -  Minimal Diameter - 7.0 x 6.2 mm  Tortuosity - mild  Calcification - moderate  Review of the MIP images confirms the above findings.  IMPRESSION: 1. Vascular findings  and measurements pertinent to potential TAVR procedure, as detailed above. This patient does appear to have suitable pelvic arterial access, particularly on the left side. 2. Exophytic enhancing lesion in the interpolar region of the right kidney, currently measuring 3.5 x 3.3 x 3.7 cm, only slightly increased in size compared to prior examinations. Given the extremely slow interval growth compared to prior studies, the possibility of a benign lesion such as an oncocytoma should be considered, however, the possibility of a renal cell carcinoma remains of concern. Urologic consultation should be considered if not recently obtained. 3. 4  mm subpleural nodule in the periphery of the right lower lobe is highly nonspecific and may simply represent a subpleural lymph node. If the patient is at high risk for bronchogenic carcinoma, follow-up chest CT at 1year is recommended. If the patient is at low risk, no follow-up is needed. This recommendation follows the consensus statement: Guidelines for Management of Small Pulmonary Nodules Detected on CT Scans: A Statement from the [place] as published in Radiology 2005; 237:395-400. 4. Additional incidental findings, as above.   Electronically Signed  By: Vinnie Langton M.D.  On: 04/10/2015 16:19    Ref Range 88mo ago    FVC-Pre L 1.19   FVC-%Pred-Pre % 74   FVC-Post L 1.03   FVC-%Pred-Post % 64   FVC-%Change-Post % -13   FEV1-Pre L 1.06   FEV1-%Pred-Pre % 85   FEV1-Post L 0.89   FEV1-%Pred-Post % 71   FEV1-%Change-Post % -16   FEV6-Pre L 1.19   FEV6-%Pred-Pre % 78   FEV6-Post L 1.03   FEV6-%Pred-Post % 67   FEV6-%Change-Post % -13   Pre FEV1/FVC ratio % 89   FEV1FVC-%Pred-Pre % 119   Post FEV1/FVC ratio % 86   FEV1FVC-%Change-Post % -3   Pre FEV6/FVC Ratio % 100   FEV6FVC-%Pred-Pre % 105   Post FEV6/FVC ratio % 100   FEV6FVC-%Pred-Post % 105   FEF 25-75 Pre L/sec 1.59   FEF2575-%Pred-Pre % 162   FEF 25-75 Post L/sec 0.69   FEF2575-%Pred-Post % 70   FEF2575-%Change-Post % -56   RV L 1.84   RV % pred % 77   TLC L 3.26   TLC % pred % 68   Resulting Agency [facility]       Specimen Collected: 04/10/15 1:37 PM Last Resulted: 04/10/15 2:32 PM                    Impression:  She has stage D severe symptomatic aortic stenosis with NYHA functional class III symptoms of shortness of breath and fatigue with minimal exertion such as walking around inside her house. I have personally reviewed her echo, cath, and CTA exams. I reviewed them with the patient and her daughter today. A discussion has been held with  the patient and 2 of her daughters regarding what types of management strategies would be attempted intraoperatively in the event of life-threatening complications, including whether or not the patient would be considered a candidate for the use of cardiopulmonary bypass and/or conversion to open sternotomy for attempted surgical intervention. I told them that I did not think she was a candidate for open surgery. The patient has been advised of a variety of complications that might develop including but not limited to risks of death, stroke, paravalvular leak, aortic dissection or other major vascular complications, aortic annulus rupture, device embolization, cardiac rupture or perforation, mitral regurgitation, acute myocardial infarction, arrhythmia, heart block or bradycardia requiring permanent pacemaker placement, congestive  heart failure, respiratory failure, renal failure, pneumonia, infection, other late complications related to structural valve deterioration or migration, or other complications that might ultimately cause a temporary or permanent loss of functional independence or other long term morbidity. The patient provides full informed consent for the procedure as described and all questions were answered.  Plan:  Transfemoral TAVR on Tuesday 05/26/2015   Gaye Pollack, MD [facility] [phone]

## 2015-05-22 ENCOUNTER — Encounter (HOSPITAL_COMMUNITY): Payer: Self-pay

## 2015-05-22 ENCOUNTER — Encounter (HOSPITAL_COMMUNITY)
Admission: RE | Admit: 2015-05-22 | Discharge: 2015-05-22 | Disposition: A | Discharge status: Home / Self Care | Payer: Medicare Other | Source: Ambulatory Visit | Attending: Cardiovascular Disease | Admitting: Cardiovascular Disease

## 2015-05-22 VITALS — BP 165/71 | HR 76 | Temp 98.2°F | Resp 20 | Ht 62.0 in | Wt 234.0 lb

## 2015-05-22 DIAGNOSIS — I700 Atherosclerosis of aorta: Secondary | ICD-10-CM | POA: Diagnosis not present

## 2015-05-22 DIAGNOSIS — I350 Nonrheumatic aortic (valve) stenosis: Secondary | ICD-10-CM | POA: Insufficient documentation

## 2015-05-22 DIAGNOSIS — R9431 Abnormal electrocardiogram [ECG] [EKG]: Secondary | ICD-10-CM | POA: Diagnosis not present

## 2015-05-22 DIAGNOSIS — I517 Cardiomegaly: Secondary | ICD-10-CM | POA: Diagnosis not present

## 2015-05-22 DIAGNOSIS — Z01812 Encounter for preprocedural laboratory examination: Secondary | ICD-10-CM | POA: Diagnosis not present

## 2015-05-22 DIAGNOSIS — Z951 Presence of aortocoronary bypass graft: Secondary | ICD-10-CM | POA: Insufficient documentation

## 2015-05-22 DIAGNOSIS — Z01818 Encounter for other preprocedural examination: Secondary | ICD-10-CM | POA: Diagnosis not present

## 2015-05-22 DIAGNOSIS — Z79899 Other long term (current) drug therapy: Secondary | ICD-10-CM | POA: Diagnosis not present

## 2015-05-22 DIAGNOSIS — Z0183 Encounter for blood typing: Secondary | ICD-10-CM | POA: Insufficient documentation

## 2015-05-22 DIAGNOSIS — Z954 Presence of other heart-valve replacement: Secondary | ICD-10-CM | POA: Diagnosis not present

## 2015-05-22 HISTORY — DX: Chalazion right upper eyelid: H00.11

## 2015-05-22 HISTORY — DX: Cardiac murmur, unspecified: R01.1

## 2015-05-22 LAB — CBC
HCT: 38.1 % (ref 36.0–46.0)
Hemoglobin: 13.2 g/dL (ref 12.0–15.0)
MCH: 27 pg (ref 26.0–34.0)
MCHC: 34.6 g/dL (ref 30.0–36.0)
MCV: 77.9 fL — ABNORMAL LOW (ref 78.0–100.0)
Platelets: 332 10*3/uL (ref 150–400)
RBC: 4.89 MIL/uL (ref 3.87–5.11)
RDW: 14.3 % (ref 11.5–15.5)
WBC: 9 10*3/uL (ref 4.0–10.5)

## 2015-05-22 LAB — COMPREHENSIVE METABOLIC PANEL
ALBUMIN: 3.4 g/dL — AB (ref 3.5–5.0)
ALK PHOS: 84 U/L (ref 38–126)
ALT: 14 U/L (ref 14–54)
AST: 17 U/L (ref 15–41)
Anion gap: 11 (ref 5–15)
BUN: 17 mg/dL (ref 6–20)
CO2: 25 mmol/L (ref 22–32)
Calcium: 10.6 mg/dL — ABNORMAL HIGH (ref 8.9–10.3)
Chloride: 102 mmol/L (ref 101–111)
Creatinine, Ser: 0.74 mg/dL (ref 0.44–1.00)
GFR calc non Af Amer: >60 mL/min (ref >60)
GLUCOSE: 166 mg/dL — AB (ref 65–99)
POTASSIUM: 4 mmol/L (ref 3.5–5.1)
SODIUM: 138 mmol/L (ref 135–145)
TOTAL PROTEIN: 7.5 g/dL (ref 6.5–8.1)
Total Bilirubin: 0.4 mg/dL (ref 0.3–1.2)

## 2015-05-22 LAB — BLOOD GAS, ARTERIAL
Acid-Base Excess: 2.7 mmol/L — ABNORMAL HIGH (ref 0.0–2.0)
BICARBONATE: 26.6 meq/L — AB (ref 20.0–24.0)
Drawn by: 206361
FIO2: 0.21 %
O2 SAT: 96 %
PATIENT TEMPERATURE: 98.6
TCO2: 27.9 mmol/L (ref 0–100)
pCO2 arterial: 40.6 mmHg (ref 35.0–45.0)
pH, Arterial: 7.432 (ref 7.350–7.450)
pO2, Arterial: 82.1 mmHg (ref 80.0–100.0)

## 2015-05-22 LAB — SURGICAL PCR SCREEN
MRSA, PCR: NEGATIVE
STAPHYLOCOCCUS AUREUS: NEGATIVE

## 2015-05-22 LAB — ABO/RH: ABO/RH(D): O NEG

## 2015-05-22 LAB — APTT: aPTT: 30 seconds (ref 24–37)

## 2015-05-22 LAB — PROTIME-INR
INR: 1.01 (ref 0.00–1.49)
Prothrombin Time: 13.5 seconds (ref 11.6–15.2)

## 2015-05-22 LAB — GLUCOSE, CAPILLARY: GLUCOSE-CAPILLARY: 204 mg/dL — AB (ref 65–99)

## 2015-05-22 NOTE — Pre-Procedure Instructions (Signed)
Katherine [name]  05/22/2015      [facility] - [place], [place] - [address] AT [place] [address] Phone: [phone] Fax: [phone]  [place], [place] [address] Phone: [phone] Fax: [place], [place] [address] Phone: [phone] Fax: [phone]    Your procedure is scheduled on 05/26/2015   Report to [facility] Admitting at 5:30 A.M.   Call this number if you have problems the morning of surgery:  [phone]   Remember:  Do not eat food or drink liquids after midnight.   Take these medicines the morning of surgery with A SIP OF WATER - NEXIUM, BYSTOLIC, use inhaler if needed, you can take your pain medicine if you need it, take XANAX, use eye ointment if eye is still irritated   Do not wear jewelry, make-up or nail polish.   Do not wear lotions, powders, or perfumes.     Do not shave 48 hours prior to surgery.    Do not bring valuables to the hospital.   [facility] is not responsible for any belongings or valuables.  Contacts, dentures or bridgework may not be worn into surgery.  Leave your suitcase in the car.  After surgery it may be brought to your room.  For patients admitted to the hospital, discharge time will be determined by your treatment team.  Patients discharged the day of surgery will not be allowed to drive home.   Name and phone number of your driver:   With family Special instructions:  Special Instructions: [place] - Preparing for Surgery  Before surgery, you can play an important role.  Because skin is not sterile, your skin needs to be as free of germs as possible.  You can reduce the number of germs on you skin by washing with CHG (chlorahexidine gluconate) soap  before surgery.  CHG is an antiseptic cleaner which kills germs and bonds with the skin to continue killing germs even after washing.  Please DO NOT use if you have an allergy to CHG or antibacterial soaps.  If your skin becomes reddened/irritated stop using the CHG and inform your nurse when you arrive at Short Stay.  Do not shave (including legs and underarms) for at least 48 hours prior to the first CHG shower.  You may shave your face.  Please follow these instructions carefully:   1.  Shower with CHG Soap the night before surgery and the  morning of Surgery.  2.  If you choose to wash your hair, wash your hair first as usual with your  normal shampoo.  3.  After you shampoo, rinse your hair and body thoroughly to remove the  Shampoo.  4.  Use CHG as you would any other liquid soap.  You can apply chg directly to the skin and wash gently with scrungie or a clean washcloth.  5.  Apply the CHG Soap to your body ONLY FROM THE NECK DOWN.    Do not use on open wounds or open sores.  Avoid contact with your eyes, ears, mouth and genitals (private parts).  Wash genitals (private parts)   with your normal soap.  6.  Wash thoroughly, paying special attention to the area where  your surgery will be performed.  7.  Thoroughly rinse your body with warm water from the neck down.  8.  DO NOT shower/wash with your normal soap after using and rinsing off   the CHG Soap.  9.  Pat yourself dry with a clean towel.            10.  Wear clean pajamas.            11.  Place clean sheets on your bed the night of your first shower and do not sleep with pets.  Day of Surgery  Do not apply any lotions/deodorants the morning of surgery.  Please wear clean clothes to the hospital/surgery center.  Please read over the following fact sheets that you were given. Pain Booklet, Coughing and Deep Breathing, Blood Transfusion Information, MRSA Information and Surgical Site Infection Prevention

## 2015-05-23 LAB — HEMOGLOBIN A1C
Hgb A1c MFr Bld: 8 % — ABNORMAL HIGH (ref 4.8–5.6)
MEAN PLASMA GLUCOSE: 183 mg/dL

## 2015-05-25 MED ORDER — DEXTROSE 5 % IV SOLN
0.0000 ug/min | INTRAVENOUS | Status: DC
  Filled 2015-05-25: qty 4

## 2015-05-25 MED ORDER — METOPROLOL TARTRATE 12.5 MG HALF TABLET: 12.5000 mg | ORAL_TABLET | Freq: Once | ORAL | Status: DC

## 2015-05-25 MED ORDER — POTASSIUM CHLORIDE 2 MEQ/ML IV SOLN
80.0000 meq | INTRAVENOUS | Status: DC
  Filled 2015-05-25: qty 40

## 2015-05-25 MED ORDER — DEXTROSE 5 % IV SOLN
1.5000 g | INTRAVENOUS | Status: AC
  Administered 2015-05-26: 1.5 g via INTRAVENOUS
  Filled 2015-05-25 (×2): qty 1.5

## 2015-05-25 MED ORDER — HEPARIN SODIUM (PORCINE) 1000 UNIT/ML IJ SOLN
INTRAMUSCULAR | Status: DC
  Filled 2015-05-25: qty 30

## 2015-05-25 MED ORDER — DEXTROSE 5 % IV SOLN
30.0000 ug/min | INTRAVENOUS | Status: DC
  Filled 2015-05-25: qty 2

## 2015-05-25 MED ORDER — NITROGLYCERIN IN D5W 200-5 MCG/ML-% IV SOLN
2.0000 ug/min | INTRAVENOUS | Status: AC
  Administered 2015-05-26: 5 ug/min via INTRAVENOUS
  Filled 2015-05-25: qty 250

## 2015-05-25 MED ORDER — DOPAMINE-DEXTROSE 3.2-5 MG/ML-% IV SOLN
0.0000 ug/kg/min | INTRAVENOUS | Status: DC
  Filled 2015-05-25: qty 250

## 2015-05-25 MED ORDER — SODIUM CHLORIDE 0.9 % IV SOLN
INTRAVENOUS | Status: DC
  Administered 2015-05-26: 11:00:00 via INTRAVENOUS
  Filled 2015-05-25: qty 2.5

## 2015-05-25 MED ORDER — VANCOMYCIN HCL 10 G IV SOLR
1500.0000 mg | INTRAVENOUS | Status: AC
  Administered 2015-05-26: 1500 mg via INTRAVENOUS
  Filled 2015-05-25 (×3): qty 1500

## 2015-05-25 MED ORDER — DEXMEDETOMIDINE HCL IN NACL 400 MCG/100ML IV SOLN
0.1000 ug/kg/h | INTRAVENOUS | Status: DC
  Filled 2015-05-25: qty 100

## 2015-05-25 MED ORDER — MAGNESIUM SULFATE 50 % IJ SOLN
40.0000 meq | INTRAMUSCULAR | Status: DC
  Filled 2015-05-25: qty 10

## 2015-05-26 ENCOUNTER — Inpatient Hospital Stay (HOSPITAL_COMMUNITY): Payer: Medicare Other | Admitting: Anesthesiology

## 2015-05-26 ENCOUNTER — Encounter (HOSPITAL_COMMUNITY): Payer: Self-pay | Admitting: *Deleted

## 2015-05-26 ENCOUNTER — Inpatient Hospital Stay (HOSPITAL_COMMUNITY): Payer: Medicare Other

## 2015-05-26 ENCOUNTER — Inpatient Hospital Stay (HOSPITAL_COMMUNITY)
Admission: RE | Admit: 2015-05-26 | Discharge: 2015-06-03 | DRG: 266 | Disposition: A | Discharge status: Skilled Nursing Facility | Payer: Medicare Other | Source: Ambulatory Visit | Attending: Cardiovascular Disease | Admitting: Cardiovascular Disease

## 2015-05-26 ENCOUNTER — Encounter (HOSPITAL_COMMUNITY)
Admission: RE | Disposition: A | Discharge status: Skilled Nursing Facility | Payer: Medicare Other | Source: Ambulatory Visit | Attending: Cardiovascular Disease

## 2015-05-26 DIAGNOSIS — Z794 Long term (current) use of insulin: Secondary | ICD-10-CM

## 2015-05-26 DIAGNOSIS — Z48812 Encounter for surgical aftercare following surgery on the circulatory system: Secondary | ICD-10-CM | POA: Diagnosis not present

## 2015-05-26 DIAGNOSIS — I5033 Acute on chronic diastolic (congestive) heart failure: Secondary | ICD-10-CM | POA: Diagnosis present

## 2015-05-26 DIAGNOSIS — R4789 Other speech disturbances: Secondary | ICD-10-CM | POA: Diagnosis not present

## 2015-05-26 DIAGNOSIS — E669 Obesity, unspecified: Secondary | ICD-10-CM | POA: Diagnosis present

## 2015-05-26 DIAGNOSIS — Z23 Encounter for immunization: Secondary | ICD-10-CM

## 2015-05-26 DIAGNOSIS — M6281 Muscle weakness (generalized): Secondary | ICD-10-CM | POA: Diagnosis not present

## 2015-05-26 DIAGNOSIS — E876 Hypokalemia: Secondary | ICD-10-CM | POA: Diagnosis not present

## 2015-05-26 DIAGNOSIS — Z006 Encounter for examination for normal comparison and control in clinical research program: Secondary | ICD-10-CM

## 2015-05-26 DIAGNOSIS — E11649 Type 2 diabetes mellitus with hypoglycemia without coma: Secondary | ICD-10-CM | POA: Diagnosis present

## 2015-05-26 DIAGNOSIS — I973 Postprocedural hypertension: Secondary | ICD-10-CM | POA: Diagnosis not present

## 2015-05-26 DIAGNOSIS — I700 Atherosclerosis of aorta: Secondary | ICD-10-CM | POA: Diagnosis present

## 2015-05-26 DIAGNOSIS — K219 Gastro-esophageal reflux disease without esophagitis: Secondary | ICD-10-CM | POA: Diagnosis not present

## 2015-05-26 DIAGNOSIS — Z951 Presence of aortocoronary bypass graft: Secondary | ICD-10-CM | POA: Diagnosis not present

## 2015-05-26 DIAGNOSIS — E785 Hyperlipidemia, unspecified: Secondary | ICD-10-CM | POA: Diagnosis present

## 2015-05-26 DIAGNOSIS — I350 Nonrheumatic aortic (valve) stenosis: Principal | ICD-10-CM | POA: Insufficient documentation

## 2015-05-26 DIAGNOSIS — E119 Type 2 diabetes mellitus without complications: Secondary | ICD-10-CM

## 2015-05-26 DIAGNOSIS — J9811 Atelectasis: Secondary | ICD-10-CM | POA: Diagnosis not present

## 2015-05-26 DIAGNOSIS — I2510 Atherosclerotic heart disease of native coronary artery without angina pectoris: Secondary | ICD-10-CM | POA: Diagnosis present

## 2015-05-26 DIAGNOSIS — J383 Other diseases of vocal cords: Secondary | ICD-10-CM | POA: Diagnosis present

## 2015-05-26 DIAGNOSIS — R061 Stridor: Secondary | ICD-10-CM | POA: Diagnosis not present

## 2015-05-26 DIAGNOSIS — K5900 Constipation, unspecified: Secondary | ICD-10-CM | POA: Diagnosis not present

## 2015-05-26 DIAGNOSIS — D62 Acute posthemorrhagic anemia: Secondary | ICD-10-CM | POA: Diagnosis not present

## 2015-05-26 DIAGNOSIS — Z6841 Body Mass Index (BMI) 40.0 and over, adult: Secondary | ICD-10-CM

## 2015-05-26 DIAGNOSIS — R41841 Cognitive communication deficit: Secondary | ICD-10-CM | POA: Diagnosis not present

## 2015-05-26 DIAGNOSIS — I10 Essential (primary) hypertension: Secondary | ICD-10-CM | POA: Diagnosis not present

## 2015-05-26 DIAGNOSIS — R262 Difficulty in walking, not elsewhere classified: Secondary | ICD-10-CM | POA: Diagnosis not present

## 2015-05-26 DIAGNOSIS — F0390 Unspecified dementia without behavioral disturbance: Secondary | ICD-10-CM | POA: Diagnosis present

## 2015-05-26 DIAGNOSIS — R0602 Shortness of breath: Secondary | ICD-10-CM | POA: Insufficient documentation

## 2015-05-26 DIAGNOSIS — I359 Nonrheumatic aortic valve disorder, unspecified: Secondary | ICD-10-CM | POA: Diagnosis not present

## 2015-05-26 DIAGNOSIS — Z952 Presence of prosthetic heart valve: Secondary | ICD-10-CM | POA: Diagnosis not present

## 2015-05-26 DIAGNOSIS — I5031 Acute diastolic (congestive) heart failure: Secondary | ICD-10-CM | POA: Diagnosis not present

## 2015-05-26 DIAGNOSIS — Z953 Presence of xenogenic heart valve: Secondary | ICD-10-CM

## 2015-05-26 DIAGNOSIS — R1312 Dysphagia, oropharyngeal phase: Secondary | ICD-10-CM | POA: Diagnosis not present

## 2015-05-26 DIAGNOSIS — Z954 Presence of other heart-valve replacement: Secondary | ICD-10-CM | POA: Diagnosis not present

## 2015-05-26 DIAGNOSIS — R278 Other lack of coordination: Secondary | ICD-10-CM | POA: Diagnosis not present

## 2015-05-26 DIAGNOSIS — I509 Heart failure, unspecified: Secondary | ICD-10-CM | POA: Diagnosis not present

## 2015-05-26 DIAGNOSIS — E088 Diabetes mellitus due to underlying condition with unspecified complications: Secondary | ICD-10-CM | POA: Diagnosis not present

## 2015-05-26 DIAGNOSIS — F419 Anxiety disorder, unspecified: Secondary | ICD-10-CM | POA: Diagnosis not present

## 2015-05-26 DIAGNOSIS — I517 Cardiomegaly: Secondary | ICD-10-CM | POA: Diagnosis not present

## 2015-05-26 HISTORY — PX: TEE WITHOUT CARDIOVERSION: SHX5443

## 2015-05-26 HISTORY — PX: TRANSCATHETER AORTIC VALVE REPLACEMENT, TRANSFEMORAL: SHX6400

## 2015-05-26 LAB — APTT: aPTT: 30 seconds (ref 24–37)

## 2015-05-26 LAB — POCT I-STAT, CHEM 8
BUN: 14 mg/dL (ref 6–20)
BUN: 13 mg/dL (ref 6–20)
BUN: 12 mg/dL (ref 6–20)
BUN: 12 mg/dL (ref 6–20)
CALCIUM ION: 1.37 mmol/L — AB (ref 1.13–1.30)
CALCIUM ION: 1.4 mmol/L — AB (ref 1.13–1.30)
CHLORIDE: 102 mmol/L (ref 101–111)
CHLORIDE: 104 mmol/L (ref 101–111)
CREATININE: 0.6 mg/dL (ref 0.44–1.00)
CREATININE: 0.5 mg/dL (ref 0.44–1.00)
Calcium, Ion: 1.37 mmol/L — ABNORMAL HIGH (ref 1.13–1.30)
Calcium, Ion: 1.35 mmol/L — ABNORMAL HIGH (ref 1.13–1.30)
Chloride: 104 mmol/L (ref 101–111)
Chloride: 101 mmol/L (ref 101–111)
Creatinine, Ser: 0.5 mg/dL (ref 0.44–1.00)
Creatinine, Ser: 0.6 mg/dL (ref 0.44–1.00)
GLUCOSE: 126 mg/dL — AB (ref 65–99)
Glucose, Bld: 162 mg/dL — ABNORMAL HIGH (ref 65–99)
Glucose, Bld: 139 mg/dL — ABNORMAL HIGH (ref 65–99)
Glucose, Bld: 118 mg/dL — ABNORMAL HIGH (ref 65–99)
HCT: 34 % — ABNORMAL LOW (ref 36.0–46.0)
HCT: 31 % — ABNORMAL LOW (ref 36.0–46.0)
HCT: 30 % — ABNORMAL LOW (ref 36.0–46.0)
HCT: 33 % — ABNORMAL LOW (ref 36.0–46.0)
HEMOGLOBIN: 11.6 g/dL — AB (ref 12.0–15.0)
Hemoglobin: 10.5 g/dL — ABNORMAL LOW (ref 12.0–15.0)
Hemoglobin: 10.2 g/dL — ABNORMAL LOW (ref 12.0–15.0)
Hemoglobin: 11.2 g/dL — ABNORMAL LOW (ref 12.0–15.0)
POTASSIUM: 4.4 mmol/L (ref 3.5–5.1)
Potassium: 3.4 mmol/L — ABNORMAL LOW (ref 3.5–5.1)
Potassium: 3 mmol/L — ABNORMAL LOW (ref 3.5–5.1)
Potassium: 3.2 mmol/L — ABNORMAL LOW (ref 3.5–5.1)
SODIUM: 139 mmol/L (ref 135–145)
SODIUM: 140 mmol/L (ref 135–145)
Sodium: 140 mmol/L (ref 135–145)
Sodium: 140 mmol/L (ref 135–145)
TCO2: 29 mmol/L (ref 0–100)
TCO2: 25 mmol/L (ref 0–100)
TCO2: 25 mmol/L (ref 0–100)
TCO2: 26 mmol/L (ref 0–100)

## 2015-05-26 LAB — CBC
HCT: 31.5 % — ABNORMAL LOW (ref 36.0–46.0)
HCT: 31.3 % — ABNORMAL LOW (ref 36.0–46.0)
HEMOGLOBIN: 11.1 g/dL — AB (ref 12.0–15.0)
HEMOGLOBIN: 10.9 g/dL — AB (ref 12.0–15.0)
MCH: 27.5 pg (ref 26.0–34.0)
MCH: 27.1 pg (ref 26.0–34.0)
MCHC: 35.2 g/dL (ref 30.0–36.0)
MCHC: 34.8 g/dL (ref 30.0–36.0)
MCV: 78 fL (ref 78.0–100.0)
MCV: 77.9 fL — ABNORMAL LOW (ref 78.0–100.0)
PLATELETS: 258 10*3/uL (ref 150–400)
Platelets: 238 10*3/uL (ref 150–400)
RBC: 4.04 MIL/uL (ref 3.87–5.11)
RBC: 4.02 MIL/uL (ref 3.87–5.11)
RDW: 14.2 % (ref 11.5–15.5)
RDW: 14.4 % (ref 11.5–15.5)
WBC: 9.3 10*3/uL (ref 4.0–10.5)
WBC: 10.6 10*3/uL — AB (ref 4.0–10.5)

## 2015-05-26 LAB — GLUCOSE, CAPILLARY
GLUCOSE-CAPILLARY: 158 mg/dL — AB (ref 65–99)
GLUCOSE-CAPILLARY: 117 mg/dL — AB (ref 65–99)
Glucose-Capillary: 173 mg/dL — ABNORMAL HIGH (ref 65–99)
Glucose-Capillary: 148 mg/dL — ABNORMAL HIGH (ref 65–99)
Glucose-Capillary: 147 mg/dL — ABNORMAL HIGH (ref 65–99)
Glucose-Capillary: 157 mg/dL — ABNORMAL HIGH (ref 65–99)
Glucose-Capillary: 139 mg/dL — ABNORMAL HIGH (ref 65–99)
Glucose-Capillary: 119 mg/dL — ABNORMAL HIGH (ref 65–99)
Glucose-Capillary: 151 mg/dL — ABNORMAL HIGH (ref 65–99)
Glucose-Capillary: 125 mg/dL — ABNORMAL HIGH (ref 65–99)

## 2015-05-26 LAB — POCT I-STAT 3, ART BLOOD GAS (G3+)
ACID-BASE EXCESS: 2 mmol/L (ref 0.0–2.0)
Acid-Base Excess: 1 mmol/L (ref 0.0–2.0)
BICARBONATE: 26.5 meq/L — AB (ref 20.0–24.0)
BICARBONATE: 26.7 meq/L — AB (ref 20.0–24.0)
O2 Saturation: 100 %
O2 Saturation: 91 %
PH ART: 7.378 (ref 7.350–7.450)
PO2 ART: 57 mmHg — AB (ref 80.0–100.0)
Patient temperature: 35.4
TCO2: 28 mmol/L (ref 0–100)
TCO2: 28 mmol/L (ref 0–100)
pCO2 arterial: 38.1 mmHg (ref 35.0–45.0)
pCO2 arterial: 44.6 mmHg (ref 35.0–45.0)
pH, Arterial: 7.45 (ref 7.350–7.450)
pO2, Arterial: 205 mmHg — ABNORMAL HIGH (ref 80.0–100.0)

## 2015-05-26 LAB — PROTIME-INR
INR: 1.17 (ref 0.00–1.49)
Prothrombin Time: 15.1 seconds (ref 11.6–15.2)

## 2015-05-26 LAB — POCT I-STAT 4, (NA,K, GLUC, HGB,HCT)
GLUCOSE: 135 mg/dL — AB (ref 65–99)
HCT: 36 % (ref 36.0–46.0)
HEMOGLOBIN: 12.2 g/dL (ref 12.0–15.0)
POTASSIUM: 3.1 mmol/L — AB (ref 3.5–5.1)
SODIUM: 141 mmol/L (ref 135–145)

## 2015-05-26 LAB — CREATININE, SERUM
CREATININE: 0.62 mg/dL (ref 0.44–1.00)
GFR calc Af Amer: >60 mL/min (ref >60)
GFR calc non Af Amer: >60 mL/min (ref >60)

## 2015-05-26 LAB — PREPARE RBC (CROSSMATCH)

## 2015-05-26 LAB — MAGNESIUM: Magnesium: 1.1 mg/dL — ABNORMAL LOW (ref 1.7–2.4)

## 2015-05-26 SURGERY — IMPLANTATION, AORTIC VALVE, TRANSCATHETER, FEMORAL APPROACH
Anesthesia: General

## 2015-05-26 MED ORDER — ONDANSETRON HCL 4 MG/2ML IJ SOLN
4.0000 mg | Freq: Four times a day (QID) | INTRAMUSCULAR | Status: DC | PRN
  Administered 2015-05-28: 4 mg via INTRAVENOUS
  Filled 2015-05-26: qty 2

## 2015-05-26 MED ORDER — ASPIRIN EC 81 MG PO TBEC
81.0000 mg | DELAYED_RELEASE_TABLET | Freq: Every day | ORAL | Status: DC
  Filled 2015-05-26: qty 1

## 2015-05-26 MED ORDER — ASPIRIN 81 MG PO CHEW: 324.0000 mg | CHEWABLE_TABLET | Freq: Every day | ORAL | Status: DC

## 2015-05-26 MED ORDER — IPRATROPIUM-ALBUTEROL 20-100 MCG/ACT IN AERS: 2.0000 | INHALATION_SPRAY | Freq: Four times a day (QID) | RESPIRATORY_TRACT | Status: DC | PRN

## 2015-05-26 MED ORDER — FENTANYL CITRATE (PF) 100 MCG/2ML IJ SOLN
INTRAMUSCULAR | Status: DC | PRN
  Administered 2015-05-26 (×2): 25 ug via INTRAVENOUS

## 2015-05-26 MED ORDER — MIDAZOLAM HCL 2 MG/2ML IJ SOLN: 2.0000 mg | INTRAMUSCULAR | Status: DC | PRN

## 2015-05-26 MED ORDER — CLOPIDOGREL BISULFATE 300 MG PO TABS
600.0000 mg | ORAL_TABLET | Freq: Once | ORAL | Status: DC
  Filled 2015-05-26: qty 2

## 2015-05-26 MED ORDER — NITROPRUSSIDE SODIUM 25 MG/ML IV SOLN
0.0000 ug/kg/min | INTRAVENOUS | Status: DC
  Administered 2015-05-27 (×2): 1 ug/kg/min via INTRAVENOUS
  Filled 2015-05-26 (×3): qty 2

## 2015-05-26 MED ORDER — POTASSIUM CHLORIDE 10 MEQ/50ML IV SOLN
10.0000 meq | INTRAVENOUS | Status: AC
  Administered 2015-05-26 (×4): 10 meq via INTRAVENOUS
  Filled 2015-05-26 (×2): qty 50

## 2015-05-26 MED ORDER — ACETAMINOPHEN 160 MG/5ML PO SOLN
650.0000 mg | Freq: Once | ORAL | Status: AC
  Administered 2015-05-26: 650 mg
  Filled 2015-05-26: qty 20.3

## 2015-05-26 MED ORDER — NITROPRUSSIDE SODIUM 25 MG/ML IV SOLN
50000.0000 ug | INTRAVENOUS | Status: DC | PRN
  Administered 2015-05-26: 0.3 ug/kg/min via INTRAVENOUS

## 2015-05-26 MED ORDER — HYDRALAZINE HCL 20 MG/ML IJ SOLN
10.0000 mg | INTRAMUSCULAR | Status: DC | PRN
  Administered 2015-05-26 – 2015-06-01 (×6): 20 mg via INTRAVENOUS
  Filled 2015-05-26 (×6): qty 1

## 2015-05-26 MED ORDER — POTASSIUM CHLORIDE 10 MEQ/50ML IV SOLN
INTRAVENOUS | Status: AC
  Administered 2015-05-26: 10 meq via INTRAVENOUS
  Filled 2015-05-26: qty 150

## 2015-05-26 MED ORDER — FUROSEMIDE 40 MG PO TABS
40.0000 mg | ORAL_TABLET | Freq: Every day | ORAL | Status: DC
  Administered 2015-05-27 – 2015-05-29 (×3): 40 mg via ORAL
  Filled 2015-05-26 (×5): qty 1

## 2015-05-26 MED ORDER — ACETAMINOPHEN 500 MG PO TABS
1000.0000 mg | ORAL_TABLET | Freq: Four times a day (QID) | ORAL | Status: AC
  Administered 2015-05-27 – 2015-05-31 (×15): 1000 mg via ORAL
  Filled 2015-05-26 (×20): qty 2

## 2015-05-26 MED ORDER — MEMANTINE HCL ER 28 MG PO CP24
28.0000 mg | ORAL_CAPSULE | Freq: Every morning | ORAL | Status: DC
  Administered 2015-05-27 – 2015-06-03 (×8): 28 mg via ORAL
  Filled 2015-05-26 (×8): qty 1

## 2015-05-26 MED ORDER — HYDRALAZINE HCL 20 MG/ML IJ SOLN
INTRAMUSCULAR | Status: DC | PRN
  Administered 2015-05-26: 5 mg via INTRAVENOUS

## 2015-05-26 MED ORDER — HEPARIN SODIUM (PORCINE) 1000 UNIT/ML IJ SOLN
INTRAMUSCULAR | Status: DC | PRN
  Administered 2015-05-26: 9000 [IU] via INTRAVENOUS

## 2015-05-26 MED ORDER — MORPHINE SULFATE 2 MG/ML IJ SOLN: 2.0000 mg | INTRAMUSCULAR | Status: DC | PRN

## 2015-05-26 MED ORDER — SODIUM CHLORIDE 0.9 % IV SOLN: INTRAVENOUS | Status: DC

## 2015-05-26 MED ORDER — ALBUMIN HUMAN 5 % IV SOLN
INTRAVENOUS | Status: DC | PRN
Start: 2015-05-26 — End: 2015-05-26
  Administered 2015-05-26 (×2): via INTRAVENOUS

## 2015-05-26 MED ORDER — NOREPINEPHRINE BITARTRATE 1 MG/ML IV SOLN
4000.0000 ug | INTRAVENOUS | Status: DC | PRN
  Administered 2015-05-26: 1 ug/min via INTRAVENOUS

## 2015-05-26 MED ORDER — VANCOMYCIN HCL IN DEXTROSE 1-5 GM/200ML-% IV SOLN
1000.0000 mg | Freq: Once | INTRAVENOUS | Status: AC
  Administered 2015-05-26: 1000 mg via INTRAVENOUS
  Filled 2015-05-26: qty 200

## 2015-05-26 MED ORDER — ACETAMINOPHEN 650 MG RE SUPP
650.0000 mg | Freq: Once | RECTAL | Status: AC
  Filled 2015-05-26: qty 1

## 2015-05-26 MED ORDER — TRAMADOL HCL 50 MG PO TABS
50.0000 mg | ORAL_TABLET | ORAL | Status: DC | PRN
  Administered 2015-06-02: 50 mg via ORAL
  Filled 2015-05-26: qty 1

## 2015-05-26 MED ORDER — 0.9 % SODIUM CHLORIDE (POUR BTL) OPTIME
TOPICAL | Status: DC | PRN
  Administered 2015-05-26: 1000 mL

## 2015-05-26 MED ORDER — ERYTHROMYCIN 5 MG/GM OP OINT
1.0000 "application " | TOPICAL_OINTMENT | OPHTHALMIC | Status: DC
  Administered 2015-05-26 – 2015-06-03 (×52): 1 via OPHTHALMIC
  Filled 2015-05-26 (×6): qty 3.5

## 2015-05-26 MED ORDER — METOPROLOL TARTRATE 25 MG/10 ML ORAL SUSPENSION
12.5000 mg | Freq: Two times a day (BID) | ORAL | Status: DC
  Administered 2015-05-26: 12.5 mg
  Filled 2015-05-26 (×3): qty 5

## 2015-05-26 MED ORDER — LORATADINE 10 MG PO TABS
10.0000 mg | ORAL_TABLET | Freq: Every day | ORAL | Status: DC
  Administered 2015-05-27 – 2015-06-03 (×7): 10 mg via ORAL
  Filled 2015-05-26 (×8): qty 1

## 2015-05-26 MED ORDER — ALBUMIN HUMAN 5 % IV SOLN: 250.0000 mL | INTRAVENOUS | Status: DC | PRN

## 2015-05-26 MED ORDER — MORPHINE SULFATE 2 MG/ML IJ SOLN: 1.0000 mg | INTRAMUSCULAR | Status: AC | PRN

## 2015-05-26 MED ORDER — CLOPIDOGREL BISULFATE 75 MG PO TABS
75.0000 mg | ORAL_TABLET | Freq: Every day | ORAL | Status: DC
  Administered 2015-05-27 – 2015-06-03 (×8): 75 mg via ORAL
  Filled 2015-05-26 (×12): qty 1

## 2015-05-26 MED ORDER — PROPOFOL 10 MG/ML IV BOLUS
INTRAVENOUS | Status: AC
  Filled 2015-05-26: qty 20

## 2015-05-26 MED ORDER — ROSUVASTATIN CALCIUM 20 MG PO TABS
20.0000 mg | ORAL_TABLET | Freq: Every day | ORAL | Status: DC
  Administered 2015-05-27 – 2015-06-02 (×7): 20 mg via ORAL
  Filled 2015-05-26 (×8): qty 1

## 2015-05-26 MED ORDER — NITROPRUSSIDE SODIUM 25 MG/ML IV SOLN
0.0000 ug/kg/min | INTRAVENOUS | Status: DC
  Filled 2015-05-26: qty 2

## 2015-05-26 MED ORDER — SODIUM CHLORIDE 0.9 % IV SOLN: Freq: Once | INTRAVENOUS | Status: DC

## 2015-05-26 MED ORDER — METOPROLOL TARTRATE 1 MG/ML IV SOLN
2.5000 mg | INTRAVENOUS | Status: DC | PRN
  Administered 2015-05-31: 5 mg via INTRAVENOUS
  Filled 2015-05-26 (×2): qty 5

## 2015-05-26 MED ORDER — SODIUM CHLORIDE 0.9 % IV SOLN
INTRAVENOUS | Status: DC
  Administered 2015-05-26: 4.6 [IU]/h via INTRAVENOUS
  Filled 2015-05-26 (×2): qty 2.5

## 2015-05-26 MED ORDER — REMIFENTANIL HCL 1 MG IV SOLR
INTRAVENOUS | Status: DC | PRN
  Administered 2015-05-26: .1 ug/kg/min via INTRAVENOUS

## 2015-05-26 MED ORDER — DEXTROSE 5 % IV SOLN
1.5000 g | Freq: Two times a day (BID) | INTRAVENOUS | Status: AC
  Administered 2015-05-26 – 2015-05-28 (×4): 1.5 g via INTRAVENOUS
  Filled 2015-05-26 (×4): qty 1.5

## 2015-05-26 MED ORDER — PHENYLEPHRINE HCL 10 MG/ML IJ SOLN
0.0000 ug/min | INTRAVENOUS | Status: DC
  Filled 2015-05-26: qty 2

## 2015-05-26 MED ORDER — PROPOFOL 10 MG/ML IV BOLUS
INTRAVENOUS | Status: DC | PRN
  Administered 2015-05-26: 70 mg via INTRAVENOUS

## 2015-05-26 MED ORDER — ROCURONIUM BROMIDE 100 MG/10ML IV SOLN
INTRAVENOUS | Status: DC | PRN
  Administered 2015-05-26: 10 mg via INTRAVENOUS
  Administered 2015-05-26: 40 mg via INTRAVENOUS

## 2015-05-26 MED ORDER — PANTOPRAZOLE SODIUM 40 MG PO TBEC
40.0000 mg | DELAYED_RELEASE_TABLET | Freq: Every day | ORAL | Status: DC
  Administered 2015-05-28 – 2015-06-03 (×6): 40 mg via ORAL
  Filled 2015-05-26 (×6): qty 1

## 2015-05-26 MED ORDER — LACTATED RINGERS IV SOLN: 500.0000 mL | Freq: Once | INTRAVENOUS | Status: AC | PRN

## 2015-05-26 MED ORDER — SODIUM CHLORIDE 0.9 % IR SOLN
Status: DC | PRN
  Administered 2015-05-26: 500 mL

## 2015-05-26 MED ORDER — LIDOCAINE HCL (CARDIAC) 20 MG/ML IV SOLN
INTRAVENOUS | Status: DC | PRN
  Administered 2015-05-26: 40 mg via INTRAVENOUS

## 2015-05-26 MED ORDER — ONDANSETRON HCL 4 MG/2ML IJ SOLN
INTRAMUSCULAR | Status: DC | PRN
  Administered 2015-05-26: 4 mg via INTRAVENOUS

## 2015-05-26 MED ORDER — CHLORHEXIDINE GLUCONATE 4 % EX LIQD: 30.0000 mL | CUTANEOUS | Status: DC

## 2015-05-26 MED ORDER — OXYCODONE HCL 5 MG PO TABS: 5.0000 mg | ORAL_TABLET | ORAL | Status: DC | PRN

## 2015-05-26 MED ORDER — CHLORHEXIDINE GLUCONATE 4 % EX LIQD: 60.0000 mL | Freq: Once | CUTANEOUS | Status: DC

## 2015-05-26 MED ORDER — FAMOTIDINE IN NACL 20-0.9 MG/50ML-% IV SOLN
20.0000 mg | Freq: Two times a day (BID) | INTRAVENOUS | Status: DC
  Administered 2015-05-26: 20 mg via INTRAVENOUS
  Filled 2015-05-26: qty 50

## 2015-05-26 MED ORDER — NEOSTIGMINE METHYLSULFATE 10 MG/10ML IV SOLN
INTRAVENOUS | Status: DC | PRN
  Administered 2015-05-26: 4 mg via INTRAVENOUS

## 2015-05-26 MED ORDER — GLYCOPYRROLATE 0.2 MG/ML IJ SOLN
INTRAMUSCULAR | Status: DC | PRN
  Administered 2015-05-26: 0.6 mg via INTRAVENOUS

## 2015-05-26 MED ORDER — ACETAMINOPHEN 160 MG/5ML PO SOLN
1000.0000 mg | Freq: Four times a day (QID) | ORAL | Status: AC
  Filled 2015-05-26: qty 40

## 2015-05-26 MED ORDER — FENTANYL CITRATE (PF) 100 MCG/2ML IJ SOLN: 25.0000 ug | INTRAMUSCULAR | Status: DC | PRN

## 2015-05-26 MED ORDER — IODIXANOL 320 MG/ML IV SOLN
INTRAVENOUS | Status: DC | PRN
  Administered 2015-05-26: 59.6 mL via INTRA_ARTERIAL

## 2015-05-26 MED ORDER — INSULIN REGULAR BOLUS VIA INFUSION
0.0000 [IU] | Freq: Three times a day (TID) | INTRAVENOUS | Status: DC
  Filled 2015-05-26: qty 10

## 2015-05-26 MED ORDER — DEXMEDETOMIDINE HCL IN NACL 200 MCG/50ML IV SOLN
0.1000 ug/kg/h | INTRAVENOUS | Status: DC
Start: 2015-05-26 — End: 2015-05-27

## 2015-05-26 MED ORDER — FENTANYL CITRATE (PF) 250 MCG/5ML IJ SOLN
INTRAMUSCULAR | Status: AC
  Filled 2015-05-26: qty 5

## 2015-05-26 MED ORDER — NITROGLYCERIN IN D5W 200-5 MCG/ML-% IV SOLN
0.0000 ug/min | INTRAVENOUS | Status: DC
  Administered 2015-05-27: 40 ug/min via INTRAVENOUS
  Filled 2015-05-26: qty 250

## 2015-05-26 MED ORDER — CETYLPYRIDINIUM CHLORIDE 0.05 % MT LIQD
7.0000 mL | Freq: Two times a day (BID) | OROMUCOSAL | Status: DC
  Administered 2015-05-26 – 2015-06-03 (×13): 7 mL via OROMUCOSAL

## 2015-05-26 MED ORDER — IPRATROPIUM-ALBUTEROL 0.5-2.5 (3) MG/3ML IN SOLN
3.0000 mL | Freq: Four times a day (QID) | RESPIRATORY_TRACT | Status: DC | PRN
  Administered 2015-05-27 – 2015-05-31 (×5): 3 mL via RESPIRATORY_TRACT
  Filled 2015-05-26 (×5): qty 3

## 2015-05-26 MED ORDER — LACTATED RINGERS IV SOLN
INTRAVENOUS | Status: DC | PRN
  Administered 2015-05-26 (×2): via INTRAVENOUS

## 2015-05-26 MED ORDER — SODIUM CHLORIDE 0.9 % IV SOLN
250.0000 [IU] | INTRAVENOUS | Status: DC | PRN
  Administered 2015-05-26: 2 [IU]/h via INTRAVENOUS

## 2015-05-26 MED ORDER — SODIUM CHLORIDE 0.9 % IV SOLN
0.0125 ug/kg/min | INTRAVENOUS | Status: DC
  Filled 2015-05-26: qty 2000

## 2015-05-26 MED ORDER — PROTAMINE SULFATE 10 MG/ML IV SOLN
INTRAVENOUS | Status: DC | PRN
  Administered 2015-05-26: 15 mg via INTRAVENOUS
  Administered 2015-05-26 (×3): 25 mg via INTRAVENOUS

## 2015-05-26 MED ORDER — SODIUM CHLORIDE 0.9 % IV SOLN
INTRAVENOUS | Status: AC
  Administered 2015-05-26: 50 mL/h via INTRAVENOUS

## 2015-05-26 MED ORDER — METOPROLOL TARTRATE 12.5 MG HALF TABLET
12.5000 mg | ORAL_TABLET | Freq: Two times a day (BID) | ORAL | Status: DC
  Filled 2015-05-26 (×3): qty 1

## 2015-05-26 SURGICAL SUPPLY — 97 items
ADH SKN CLS APL DERMABOND .7 (GAUZE/BANDAGES/DRESSINGS) ×1
BAG BANDED W/RUBBER/TAPE 36X54 (MISCELLANEOUS) ×3 IMPLANT
BAG DECANTER FOR FLEXI CONT (MISCELLANEOUS) IMPLANT
BAG EQP BAND 135X91 W/RBR TAPE (MISCELLANEOUS) ×1
BAG SNAP BAND KOVER 36X36 (MISCELLANEOUS) ×6 IMPLANT
BLADE OSCILLATING /SAGITTAL (BLADE) IMPLANT
BLADE STERNUM SYSTEM 6 (BLADE) ×3 IMPLANT
BLADE SURG ROTATE 9660 (MISCELLANEOUS) IMPLANT
CABLE PACING FASLOC BIEGE (MISCELLANEOUS) ×2 IMPLANT
CABLE PACING FASLOC BLUE (MISCELLANEOUS) ×3 IMPLANT
CANNULA FEM VENOUS REMOTE 22FR (CANNULA) IMPLANT
CANNULA OPTISITE PERFUSION 16F (CANNULA) IMPLANT
CANNULA OPTISITE PERFUSION 18F (CANNULA) IMPLANT
CATH DIAG EXPO 6F VENT PIG 145 (CATHETERS) ×4 IMPLANT
CATH EXPO 5FR AL1 (CATHETERS) ×2 IMPLANT
CATH S G BIP PACING (SET/KITS/TRAYS/PACK) ×3 IMPLANT
CATH SOFT-VU 4F 65 STRAIGHT (CATHETERS) IMPLANT
CATH SOFT-VU STRAIGHT 4F 65CM (CATHETERS) ×3
CLIP TI MEDIUM 24 (CLIP) ×3 IMPLANT
CLIP TI WIDE RED SMALL 24 (CLIP) ×3 IMPLANT
CONT SPEC 4OZ CLIKSEAL STRL BL (MISCELLANEOUS) ×6 IMPLANT
COVER BACK TABLE 24X17X13 BIG (DRAPES) ×3 IMPLANT
COVER DOME SNAP 22 D (MISCELLANEOUS) ×3 IMPLANT
COVER MAYO STAND STRL (DRAPES) ×3 IMPLANT
COVER TABLE BACK 60X90 (DRAPES) ×3 IMPLANT
CRADLE DONUT ADULT HEAD (MISCELLANEOUS) ×3 IMPLANT
DERMABOND ADVANCED (GAUZE/BANDAGES/DRESSINGS) ×2
DERMABOND ADVANCED .7 DNX12 (GAUZE/BANDAGES/DRESSINGS) ×1 IMPLANT
DRAPE INCISE IOBAN 66X45 STRL (DRAPES) IMPLANT
DRAPE SLUSH MACHINE 52X66 (DRAPES) ×3 IMPLANT
DRAPE TABLE COVER HEAVY DUTY (DRAPES) ×3 IMPLANT
DRSG TEGADERM 4X4.75 (GAUZE/BANDAGES/DRESSINGS) ×3 IMPLANT
ELECT REM PT RETURN 9FT ADLT (ELECTROSURGICAL) ×6
ELECTRODE REM PT RTRN 9FT ADLT (ELECTROSURGICAL) ×2 IMPLANT
FELT TEFLON 6X6 (MISCELLANEOUS) ×3 IMPLANT
FEMORAL VENOUS CANN RAP (CANNULA) IMPLANT
GAUZE SPONGE 4X4 12PLY STRL (GAUZE/BANDAGES/DRESSINGS) ×3 IMPLANT
GLOVE ECLIPSE 8.0 STRL XLNG CF (GLOVE) ×6 IMPLANT
GLOVE EUDERMIC 7 POWDERFREE (GLOVE) ×6 IMPLANT
GLOVE ORTHO TXT STRL SZ7.5 (GLOVE) ×6 IMPLANT
GOWN STRL REUS W/ TWL LRG LVL3 (GOWN DISPOSABLE) ×3 IMPLANT
GOWN STRL REUS W/ TWL XL LVL3 (GOWN DISPOSABLE) ×6 IMPLANT
GOWN STRL REUS W/TWL LRG LVL3 (GOWN DISPOSABLE) ×9
GOWN STRL REUS W/TWL XL LVL3 (GOWN DISPOSABLE) ×18
GUIDEWIRE SAF TJ AMPL .035X180 (WIRE) ×3 IMPLANT
GUIDEWIRE SAFE TJ AMPLATZ EXST (WIRE) ×2 IMPLANT
GUIDEWIRE STRAIGHT .035 260CM (WIRE) ×3 IMPLANT
INSERT FOGARTY 61MM (MISCELLANEOUS) ×3 IMPLANT
INSERT FOGARTY SM (MISCELLANEOUS) ×6 IMPLANT
INSERT FOGARTY XLG (MISCELLANEOUS) IMPLANT
KIT BASIN OR (CUSTOM PROCEDURE TRAY) ×3 IMPLANT
KIT DILATOR VASC 18G NDL (KITS) IMPLANT
KIT HEART LEFT (KITS) ×2 IMPLANT
KIT ROOM TURNOVER OR (KITS) ×3 IMPLANT
KIT SUCTION CATH 14FR (SUCTIONS) ×6 IMPLANT
NDL PERC 18GX7CM (NEEDLE) ×1 IMPLANT
NEEDLE PERC 18GX7CM (NEEDLE) ×3 IMPLANT
NS IRRIG 1000ML POUR BTL (IV SOLUTION) ×9 IMPLANT
PACK AORTA (CUSTOM PROCEDURE TRAY) ×3 IMPLANT
PAD ARMBOARD 7.5X6 YLW CONV (MISCELLANEOUS) ×6 IMPLANT
PAD ELECT DEFIB RADIOL ZOLL (MISCELLANEOUS) ×3 IMPLANT
SHEATH PINNACLE 6F 10CM (SHEATH) ×4 IMPLANT
SPONGE GAUZE 4X4 12PLY STER LF (GAUZE/BANDAGES/DRESSINGS) ×4 IMPLANT
SPONGE LAP 4X18 X RAY DECT (DISPOSABLE) ×3 IMPLANT
STOPCOCK MORSE 400PSI 3WAY (MISCELLANEOUS) ×6 IMPLANT
SUT ETHIBOND X763 2 0 SH 1 (SUTURE) ×3 IMPLANT
SUT GORETEX CV 4 TH 22 36 (SUTURE) ×3 IMPLANT
SUT GORETEX CV4 TH-18 (SUTURE) ×9 IMPLANT
SUT GORETEX TH-18 36 INCH (SUTURE) ×6 IMPLANT
SUT PROLENE 3 0 SH1 36 (SUTURE) IMPLANT
SUT PROLENE 4 0 RB 1 (SUTURE) ×3
SUT PROLENE 4-0 RB1 .5 CRCL 36 (SUTURE) ×1 IMPLANT
SUT PROLENE 5 0 C 1 36 (SUTURE) ×6 IMPLANT
SUT PROLENE 6 0 C 1 30 (SUTURE) ×6 IMPLANT
SUT SILK  1 MH (SUTURE) ×2
SUT SILK 1 MH (SUTURE) ×1 IMPLANT
SUT SILK 2 0 SH CR/8 (SUTURE) IMPLANT
SUT VIC AB 2-0 CT1 27 (SUTURE) ×3
SUT VIC AB 2-0 CT1 TAPERPNT 27 (SUTURE) ×1 IMPLANT
SUT VIC AB 2-0 CTX 36 (SUTURE) IMPLANT
SUT VIC AB 3-0 SH 8-18 (SUTURE) ×6 IMPLANT
SYR 130ML INJECTION CT SYSTEM (MISCELLANEOUS) ×2 IMPLANT
SYR 20CC LL (SYRINGE) ×2 IMPLANT
SYR 30ML LL (SYRINGE) ×6 IMPLANT
SYR 50ML LL SCALE MARK (SYRINGE) ×3 IMPLANT
SYR 5ML LL (SYRINGE) ×2 IMPLANT
SYRINGE 10CC LL (SYRINGE) ×2 IMPLANT
TAPE CLOTH SURG 4X10 WHT LF (GAUZE/BANDAGES/DRESSINGS) ×4 IMPLANT
TOWEL OR 17X26 10 PK STRL BLUE (TOWEL DISPOSABLE) ×6 IMPLANT
TRANSDUCER W/STOPCOCK (MISCELLANEOUS) ×4 IMPLANT
TRAY FOLEY IC TEMP SENS 16FR (CATHETERS) ×3 IMPLANT
TUBING ART PRESS 72  MALE/FEM (TUBING) ×2
TUBING ART PRESS 72 MALE/FEM (TUBING) IMPLANT
TUBING HIGH PRESSURE 120CM (CONNECTOR) ×3 IMPLANT
VALVE HEART TRANSCATH SZ3 23MM (Prosthesis & Implant Heart) ×2 IMPLANT
WIRE .035 3MM-J 145CM (WIRE) ×2 IMPLANT
WIRE AMPLATZ SS-J .035X180CM (WIRE) ×2 IMPLANT

## 2015-05-26 NOTE — Interval H&P Note (Signed)
History and Physical Interval Note:  05/26/2015 7:21 AM  Katherine [name]  has presented today for surgery, with the diagnosis of SEVERE AORTIC STENOSIS  The various methods of treatment have been discussed with the patient and family. After consideration of risks, benefits and other options for treatment, the patient has consented to  Procedure(s): TRANSCATHETER AORTIC VALVE REPLACEMENT, TRANSFEMORAL (N/A) TRANSESOPHAGEAL ECHOCARDIOGRAM (TEE) (N/A) as a surgical intervention .  The patient's history has been reviewed, patient examined, no change in status, stable for surgery.  I have reviewed the patient's chart and labs.  Questions were answered to the patient's satisfaction.     MCALHANY,CHRISTOPHER

## 2015-05-26 NOTE — Transfer of Care (Signed)
Immediate Anesthesia Transfer of Care Note  Patient: Katherine [name]  Procedure(s) Performed: Procedure(s): TRANSCATHETER AORTIC VALVE REPLACEMENT, TRANSFEMORAL (N/A) TRANSESOPHAGEAL ECHOCARDIOGRAM (TEE) (N/A)  Patient Location: ICU  Anesthesia Type:General  Level of Consciousness: awake and alert   Airway & Oxygen Therapy: Patient Spontanous Breathing and Patient connected to nasal cannula oxygen  Post-op Assessment: Report given to RN, Post -op Vital signs reviewed and stable and Patient moving all extremities  Post vital signs: Reviewed and stable.  On NTG, Nipride for Blood pressure control  Last Vitals:  Filed Vitals:   05/26/15 0558  BP: 171/115  Pulse:   Temp:   Resp:     Complications: No apparent anesthesia complications

## 2015-05-26 NOTE — Progress Notes (Signed)
Utilization Review Completed.Donne Anon T6/21/2016

## 2015-05-26 NOTE — H&P (View-Only) (Signed)
HPI:  The patient returns today to review the CT scans done for [name] workup and to be sure that nothing has changed significantly since I last saw her on 04/16/2015. She has been feeling about the same with marked tiredness and exertional fatigue. Her daughter says that she has been walking around her house but can't do much of anything without exhaustion.  Current Outpatient Prescriptions  Medication Sig Dispense Refill  . acetaminophen (TYLENOL) 500 MG tablet Take 1,000 mg by mouth 2 (two) times daily. Take 1000 mg by mouth in the morning and take 1000 mg by mouth in the evening.    Marland Kitchen ALPRAZolam (XANAX) 0.25 MG tablet Take 0.125-0.25 tablets by mouth 2 (two) times daily as needed for anxiety or sleep.     . fexofenadine (ALLEGRA) 180 MG tablet Take 180 mg by mouth daily.    . furosemide (LASIX) 20 MG tablet TAKE 2 TABLETS BY MOUTH DAILY 60 tablet 9  . HYDROcodone-acetaminophen (NORCO) 5-325 MG per tablet Take 1 tablet by mouth daily as needed for moderate pain.     Marland Kitchen insulin detemir (LEVEMIR FLEXPEN) 100 UNIT/ML injection Inject 48 Units into the skin 2 (two) times daily.     . Ipratropium-Albuterol (COMBIVENT RESPIMAT) 20-100 MCG/ACT AERS respimat Inhale 2 puffs into the lungs every 6 (six) hours as needed for wheezing or shortness of breath. 1 Inhaler 2  . Linaclotide (LINZESS) 145 MCG CAPS capsule Take 145 mcg by mouth daily as needed (for constipation).    Marland Kitchen loperamide (IMODIUM) 2 MG capsule Take 2 mg by mouth as needed for diarrhea or loose stools.    . Memantine HCl ER (NAMENDA XR) 28 MG CP24 Take 28 mg by mouth every morning.    . metFORMIN (GLUCOPHAGE-XR) 500 MG 24 hr tablet Take 500 mg by mouth daily with breakfast.     . nebivolol (BYSTOLIC) 10 MG tablet Take 10 mg by mouth every morning.    Marland Kitchen NEXIUM 20 MG capsule Take 20 mg by mouth daily.     . NON FORMULARY Apply 1-2 application topically 4 (four) times daily. DIC 3 % / BAC 2 % / GAB 5 % / LID-PRIL 2.5 % - 2.5 % / MENT  1 %    . potassium chloride SA (K-DUR,KLOR-CON) 20 MEQ tablet Take 20 mEq by mouth daily.    . rosuvastatin (CRESTOR) 20 MG tablet Take 20 mg by mouth at bedtime.     . VOLTAREN 1 % GEL Apply 2 g topically daily as needed (pain).      No current facility-administered medications for this visit.     Physical Exam: BP 185/82 mmHg  Pulse 74  Resp 20  Ht 5\' 2"  (1.575 m)  Wt 234 lb (106.142 kg)  BMI 42.79 kg/m2  SpO2 94% Obese, elderly woman in no distress in a wheel chair Lungs are clear Cardiac exam shows a regular rate and rhythm with a 3/6 systolic murmur along the RSB. There is mild lower leg edema  Diagnostic Tests:  ADDENDUM REPORT: 04/14/2015 15:35  CLINICAL DATA: Aortic stenosis  EXAM: Cardiac TAVR CT  TECHNIQUE: The patient was scanned on a Philips 256 scanner. A 120 kV retrospective scan was triggered in the descending thoracic aorta at 111 HU's. Gantry rotation speed was 270 msecs and collimation was .9 mm. No beta blockade or nitro were given. The 3D data set was reconstructed in 5% intervals of the R-R cycle. Systolic and diastolic phases were analyzed  on a dedicated work station using MPR, MIP and VRT modes. The patient received 80 cc of contrast.  FINDINGS: Aortic Valve: Tri leaflet moderately calcified leaflets with restricted motion. Heavy calcification if the sinotubular junction and superior aspect of the sinuses. Annulus with no calcification  Aorta: Calcification of the arch and descending thoracic aorta. Bovine Arch. No aneurysm or bulky mural debris.  Sinotubular Junction: 25 mm  Ascending Thoracic Aorta: 29.5 mm  Descending Thoracic Aorta: 27 mm  Sinus of Valsalva Measurements:  Non-coronary: 27 mm  Right -coronary: 26 mm  Left -coronary: 29 mm  Coronary Artery Height above Annulus:  Left Main: 12.3 mm  Right Coronary: 10.8 mm  Virtual Basal Annulus Measurements:  Maximum/Minimum Diameter: 24.4 mm x  20.8 mm  Perimeter: 71.6 mm  Area: 391 mm2  Coronary Arteries: Patient SVG to PDA, Patent SVG OM, Patent SVG diagonal and patent LIMA to LAD  Optimum Fluoroscopic Angle for Delivery: RAO 6 degrees Cranial 0 degrees  IMPRESSION: 1) Stenotic trileaflet aortic valve Annulus 391 mm2 suitable for a 23 mm Sapien 3 valve  2) Suitable coronary heights for deployment with patent SVG;s x 3 and patent LIMA  3) Heavily calcified STJ over 50% of circumference  4) No LAA thrombus  5) Optimum angiographic angle for delivery RAO 6 degrees Cranial 0 degrees  Jenkins Rouge   Electronically Signed  By: Jenkins Rouge M.D.  On: 04/14/2015 15:35      Study Result     EXAM: OVER-READ INTERPRETATION CT CHEST  The following report is an over-read performed by radiologist Dr. Julian Hy of [facility] on 04/14/2015. This over-read does not include interpretation of cardiac or coronary anatomy or pathology. The coronary CTA interpretation by the cardiologist is attached.  COMPARISON: CT chest dated 04/10/2015.  FINDINGS: Scattered small pulmonary nodules, including a 4 mm subpleural nodule in the right middle lobe (series 412/image 40), a 3 mm subpleural nodule in the right upper lobe along the major fissure (series 412/ image 42), and a 2 mm subpleural nodule in the anterior right middle lobe (series 412/ image 51). These are better evaluated on dedicated CTA chest.  Dependent atelectasis in the right lower lobe. No pleural effusion or pneumothorax.  Cardiomegaly. No pericardial effusion. Coronary atherosclerosis. Postsurgical changes related to prior CABG. Atherosclerotic calcifications of the aortic arch.  No suspicious mediastinal, hilar, or axillary lymphadenopathy.  Visualized left thyroid is nodular/heterogeneous.  Degenerative changes of the thoracic spine. Median sternotomy.  Visualized upper abdomen is  unremarkable.  IMPRESSION: Scattered small right pulmonary nodules measuring up to 4 mm, better evaluated on recent dedicated CTA chest.  Otherwise, no significant extracardiac findings.  Electronically Signed: By: Julian Hy M.D. On: 04/14/2015 12:05    CLINICAL DATA: 81 year old female with history of severe aortic stenosis. Preprocedural study prior to potential transcatheter aortic valve replacement (TAVR).  EXAM: CT ANGIOGRAPHY CHEST, ABDOMEN AND PELVIS  TECHNIQUE: Multidetector CT imaging through the chest, abdomen and pelvis was performed using the standard protocol during bolus administration of intravenous contrast. Multiplanar reconstructed images and MIPs were obtained and reviewed to evaluate the vascular anatomy.  CONTRAST: 132mL OMNIPAQUE IOHEXOL 350 MG/ML SOLN  COMPARISON: CT of the abdomen and pelvis 06/03/2014. Chest CT 12/22/2009.  FINDINGS: CTA CHEST FINDINGS  Mediastinum/Lymph Nodes: Heart size is mildly enlarged. There is no significant pericardial fluid, thickening or pericardial calcification. There is atherosclerosis of the thoracic aorta, the great vessels of the mediastinum and the coronary arteries, including calcified atherosclerotic plaque in the  left main, left anterior descending, left circumflex and right coronary arteries. Status post median sternotomy for CABG, including LIMA to the LAD. Moderately calcified aortic valve. No pathologically enlarged mediastinal or hilar lymph nodes. Esophagus is unremarkable in appearance. No axillary lymphadenopathy.  Lungs/Pleura: 4 mm subpleural nodule in the periphery of the right lower lobe (image 68 of series 5). No acute consolidative airspace disease. No pleural effusions. Mild subsegmental atelectasis in the right lower lobe.  Musculoskeletal/Soft Tissues: Median sternotomy wires. There are no aggressive appearing lytic or blastic lesions noted in the visualized  portions of the skeleton.  CTA ABDOMEN AND PELVIS FINDINGS  Hepatobiliary: No cystic or solid hepatic lesions. No intra or extrahepatic biliary ductal dilatation. Gallbladder is unremarkable in appearance.  Pancreas: No pancreatic mass. No pancreatic ductal dilatation. No pancreatic or peripancreatic fluid collections or inflammatory changes.  Spleen: Unremarkable.  Adrenals/Urinary Tract: Bilateral adrenal glands are normal in appearance. In the posterior aspect of the interpolar region of the right kidney there is a exophytic 3.5 x 3.3 x 3.7 cm heterogeneously enhancing lesion extending inferiorly, which is only slightly enlarged compared to remote prior studies dating back to 11/13/2008. This is completely separate from the right renal vein, which is grossly patent. There are multiple low-attenuation lesions in the kidneys bilaterally, many of which are too small to definitively characterize. The larger of these lesions are all compatible with simple cysts, with the largest of these measuring 3.4 cm interpolar region of the right kidney. Focal area of dystrophic calcification in the upper pole of the left kidney also noted, similar compared to remote prior examinations dating back to 11/13/2008. No hydroureteronephrosis. Urinary bladder is normal in appearance.  Stomach/Bowel: The appearance of the stomach is normal. No pathologic dilatation of small bowel or colon. There is a short segment of distal ileum which extends into and umbilical hernia, without signs of incarceration or obstruction at this time.  Vascular/Lymphatic: Vascular findings and measurements pertinent to potential TAVR procedure, as detailed below. No lymphadenopathy noted in the abdomen or pelvis.  Reproductive: The uterus is heterogeneous in the enlarged with multiple densely calcified lesions, most compatible with fibroids. Ovaries are atrophic.  Other: No significant volume of ascites. No  pneumoperitoneum.  Musculoskeletal: Old L1 vertebral body compression fracture with approximately 20% loss of anterior vertebral body height. 5 mm of anterolisthesis of L4 upon L5. There are no aggressive appearing lytic or blastic lesions noted in the visualized portions of the skeleton.  VASCULAR MEASUREMENTS PERTINENT TO TAVR:  AORTA:  Minimal Aortic Diameter - 12 x 12 mm  Severity of Aortic Calcification - severe  RIGHT PELVIS:  Right Common Iliac Artery -  Minimal Diameter - 7.1 x 9.5 mm  Tortuosity - mild  Calcification - moderate-to-severe  Right External Iliac Artery -  Minimal Diameter - 7.5 x 7.4 mm  Tortuosity - mild  Calcification - mild  Right Common Femoral Artery -  Minimal Diameter - 8.1 x 3.8 mm  Tortuosity - mild  Calcification - moderate  LEFT PELVIS:  Left Common Iliac Artery -  Minimal Diameter - 9.6 x 8.8 mm  Tortuosity - mild  Calcification - moderate  Left External Iliac Artery -  Minimal Diameter - 6.6 x 7.4 mm  Tortuosity - mild  Calcification - mild  Left Common Femoral Artery -  Minimal Diameter - 7.0 x 6.2 mm  Tortuosity - mild  Calcification - moderate  Review of the MIP images confirms the above findings.  IMPRESSION: 1. Vascular findings  and measurements pertinent to potential TAVR procedure, as detailed above. This patient does appear to have suitable pelvic arterial access, particularly on the left side. 2. Exophytic enhancing lesion in the interpolar region of the right kidney, currently measuring 3.5 x 3.3 x 3.7 cm, only slightly increased in size compared to prior examinations. Given the extremely slow interval growth compared to prior studies, the possibility of a benign lesion such as an oncocytoma should be considered, however, the possibility of a renal cell carcinoma remains of concern. Urologic consultation should be considered if not recently obtained. 3. 4  mm subpleural nodule in the periphery of the right lower lobe is highly nonspecific and may simply represent a subpleural lymph node. If the patient is at high risk for bronchogenic carcinoma, follow-up chest CT at 1year is recommended. If the patient is at low risk, no follow-up is needed. This recommendation follows the consensus statement: Guidelines for Management of Small Pulmonary Nodules Detected on CT Scans: A Statement from the [employer] as published in Radiology 2005; 237:395-400. 4. Additional incidental findings, as above.   Electronically Signed  By: Vinnie Langton M.D.  On: 04/10/2015 16:19    Ref Range 9mo ago    FVC-Pre L 1.19   FVC-%Pred-Pre % 74   FVC-Post L 1.03   FVC-%Pred-Post % 64   FVC-%Change-Post % -13   FEV1-Pre L 1.06   FEV1-%Pred-Pre % 85   FEV1-Post L 0.89   FEV1-%Pred-Post % 71   FEV1-%Change-Post % -16   FEV6-Pre L 1.19   FEV6-%Pred-Pre % 78   FEV6-Post L 1.03   FEV6-%Pred-Post % 67   FEV6-%Change-Post % -13   Pre FEV1/FVC ratio % 89   FEV1FVC-%Pred-Pre % 119   Post FEV1/FVC ratio % 86   FEV1FVC-%Change-Post % -3   Pre FEV6/FVC Ratio % 100   FEV6FVC-%Pred-Pre % 105   Post FEV6/FVC ratio % 100   FEV6FVC-%Pred-Post % 105   FEF 25-75 Pre L/sec 1.59   FEF2575-%Pred-Pre % 162   FEF 25-75 Post L/sec 0.69   FEF2575-%Pred-Post % 70   FEF2575-%Change-Post % -56   RV L 1.84   RV % pred % 77   TLC L 3.26   TLC % pred % 68   Resulting Agency [facility]       Specimen Collected: 04/10/15 1:37 PM Last Resulted: 04/10/15 2:32 PM                    Impression:  She has stage D severe symptomatic aortic stenosis with NYHA functional class III symptoms of shortness of breath and fatigue with minimal exertion such as walking around inside her house. I have personally reviewed her echo, cath, and CTA exams. I reviewed them with the patient and her daughter today. A discussion has been held with  the patient and 2 of her daughters regarding what types of management strategies would be attempted intraoperatively in the event of life-threatening complications, including whether or not the patient would be considered a candidate for the use of cardiopulmonary bypass and/or conversion to open sternotomy for attempted surgical intervention. I told them that I did not think she was a candidate for open surgery. The patient has been advised of a variety of complications that might develop including but not limited to risks of death, stroke, paravalvular leak, aortic dissection or other major vascular complications, aortic annulus rupture, device embolization, cardiac rupture or perforation, mitral regurgitation, acute myocardial infarction, arrhythmia, heart block or bradycardia requiring permanent pacemaker placement, congestive  heart failure, respiratory failure, renal failure, pneumonia, infection, other late complications related to structural valve deterioration or migration, or other complications that might ultimately cause a temporary or permanent loss of functional independence or other long term morbidity. The patient provides full informed consent for the procedure as described and all questions were answered.  Plan:  Transfemoral TAVR on Tuesday 05/26/2015   Gaye Pollack, MD [facility] [phone]

## 2015-05-26 NOTE — Op Note (Signed)
CARDIOTHORACIC SURGERY OPERATIVE NOTE  Date of Procedure:  05/26/2015  Preoperative Diagnosis: Severe Aortic Stenosis   Postoperative Diagnosis: Same   Procedure:    Transcatheter Aortic Valve Replacement - Left Transfemoral Approach  Edwards Sapien 3 Transcatheter Heart Valve (size 23 mm, model # 9600TFX, serial # [id number])   Co-Surgeons:  Gaye Pollack, MD and Lauree Chandler, MD  Assistants:   Sherren Mocha, MD Anesthesiologist:  Roderic Palau, MD  Echocardiographer:  Jenkins Rouge, MD  Pre-operative Echo Findings:   severe aortic stenosis  normal left ventricular systolic function  Mild MR  Post-operative Echo Findings:  trivial paravalvular leak  normal left ventricular systolic function  Unchanged mild MR.     DETAILS OF THE OPERATIVE PROCEDURE  The majority of the procedure is documented separately in a procedure note by Dr. Shelton Silvas ACCESS:   A small incision is made in the left groin immediately over the common femoral artery. The subcutaneous tissues are divided with electrocautery and the anterior surface of the common femoral artery is identified. Sharp dissection is utilized to free up the artery proximally and distally and the vessel is encircled with a vessel loop.  A pair of CV-4 Gore-tex sutures are place as diamond-shaped purse-strings on the anterior surface of the femoral artery.  The patient is heparinized systemically and ACT verified > 250 seconds.  The common femoral artery is punctured using an  18 gauge needle and a soft J-tipped guidewire is passed into the common iliac artery under fluoroscopic guidance.  A 6 Fr straight diagnostic catheter is placed over the guidewire and the guidewire is removed.  An Amplatz super stiff guidewire is passed through the sheath into the descending thoracic aorta and the introducing diagnostic catheter is removed.  Serial dilators are passed over the guidewire under continuous  fluoroscopic guidance, making certain that each dilator passes easily all of the way into the distal abdominal aorta.  A 14  Fr Burgess Estelle is passed over the guidewire into the abdominal aorta.  The introducing dilator is removed, the sheath is flushed with heparinized saline, and the sheath is secured to the skin.    FEMORAL SHEATH REMOVAL AND ARTERIAL CLOSURE:  After the completion of successful valve deployment as documented separately by Dr. Angelena Form, the femoral artery sheath is removed and the arteriotomy is closed using the previously placed Gore-tex purse-string sutures. Once the repair has been completed protamine was administered to reverse the anticoagulation. There was no visible narrowing at the arteriotomy site and a great pulse distal to the site. The incision is irrigated with saline solution and subsequently closed in multiple layers using absorbable suture.  The skin incision is closed using a subcuticular skin closure.     Gaye Pollack, MD 05/26/2015 12:02 PM

## 2015-05-26 NOTE — Anesthesia Procedure Notes (Addendum)
Procedure Name: Intubation Date/Time: 05/26/2015 7:59 AM Performed by: Suzy Bouchard Pre-anesthesia Checklist: Patient identified, Emergency Drugs available, Suction available, Patient being monitored and Timeout performed Patient Re-evaluated:Patient Re-evaluated prior to inductionOxygen Delivery Method: Circle system utilized Preoxygenation: Pre-oxygenation with 100% oxygen Intubation Type: IV induction Ventilation: Mask ventilation without difficulty Laryngoscope Size: Miller and 2 Grade View: Grade I Tube type: Oral Tube size: 7.0 mm Number of attempts: 1 Airway Equipment and Method: Stylet and LTA kit utilized Placement Confirmation: ETT inserted through vocal cords under direct vision,  positive ETCO2 and CO2 detector Secured at: 22 cm Tube secured with: Tape Dental Injury: Teeth and Oropharynx as per pre-operative assessment

## 2015-05-26 NOTE — CV Procedure (Signed)
[facility]  TAVR OPERATIVE NOTE   Date of Procedure:  05/26/2015  Preoperative Diagnosis: Severe Aortic Stenosis   Postoperative Diagnosis: Same   Procedure:    Transcatheter Aortic Valve Replacement - Transfemoral Approach  Edwards Sapien 3 THV (size 23 mm, model [id number],  serial [id number] )   Co-Surgeons:  Gaye Pollack, MD and Lauree Chandler, MD   Assistants:   Sherren Mocha, MD  Anesthesiologist:  Ola Spurr  Echocardiographer:  Johnsie Cancel  Pre-operative Echo Findings:  Severe aortic stenosis  Normal left ventricular systolic function  Post-operative Echo Findings:  Trivial paravalvular leak  Normal left ventricular systolic function  BRIEF CLINICAL NOTE AND INDICATIONS FOR SURGERY  81 yo female with history of severe aortic valve stenosis, CAD s/p 4V CABG in 2003, DM, HTN, hyperlipidemia, chronic diastolic CHF, obesity who is here today for TAVR. She was referred to the valve clinic in April 2016 by Dr. Debara Pickett to discuss TAVR for treatment of her severe aortic valve stenosis. She has known CAD and underwent 4 vessel CABG in 2003. Cardiac cath 03/24/15 showed patent bypass grafts. She has diabetes managed with insulin and oral therapy. She has been followed for the last few years by Dr. Debara Pickett for moderate aortic valve stenosis. She was last seen in the office by Dr. Debara Pickett on 02/20/15 and c/o more shortness of breath. She has chronic diastolic heart failure with chronic lower extremity edema that has been managed effectively with Lasix. Echo 03/03/15 with normal LV systolic function, [id number]. There was mild concentric hypertrophy and grade 1 diastolic dysfunction. The aortic valve is heavily calcified with poor leaflet excursion. Mean gradient across the aortic valve was 37 mmHg and peak gradient was 62 mm Hg. Aortic valve area estimated at 1.46 cm2. She has undergone CTA of the chest/abdomen/pelvis demonstrating calcific aorta and iliac system but  anatomy favorable for transfemoral approach to TAVR. Cardiac CT with valve area calculated at 391 mm2. Her symptoms include dyspnea and fatigue with minimal exertion.   During the course of the patient's preoperative work up they have been evaluated comprehensively by a multidisciplinary team of specialists coordinated through the [facility] in the [facility].  They have been demonstrated to suffer from symptomatic severe aortic stenosis as noted above. The patient has been counseled extensively as to the relative risks and benefits of all options for the treatment of severe aortic stenosis including long term medical therapy, conventional surgery for aortic valve replacement, and transcatheter aortic valve replacement.  The patient has been independently evaluated by two cardiac surgeons including Dr Cyndia Bent and Dr. Servando Snare  and they are felt to be at high risk for conventional surgical aortic valve replacement based upon a predicted risk of mortality using the Society of Thor[employer] Surgeons risk calculator of 5.768 %. Both surgeons indicated the patient would be a poor candidate for conventional surgery (predicted risk of mortality >15% and/or predicted risk of permanent morbidity >50%) because of comorbidities including advanced age, obesity, CAD, prior cardiac surgery.   Based upon review of all of the patient's preoperative diagnostic tests they are felt to be candidate for transcatheter aortic valve replacement using the transfemoral approach as an alternative to high risk conventional surgery.    Following the decision to proceed with transcatheter aortic valve replacement, a discussion has been held regarding what types of management strategies would be attempted intraoperatively in the event of life-threatening complications, including whether or not the patient would be considered a  candidate for the use of cardiopulmonary bypass and/or conversion to  open sternotomy for attempted surgical intervention.  The patient has been advised of a variety of complications that might develop peculiar to this approach including but not limited to risks of death, stroke, paravalvular leak, aortic dissection or other major vascular complications, aortic annulus rupture, device embolization, cardiac rupture or perforation, acute myocardial infarction, arrhythmia, heart block or bradycardia requiring permanent pacemaker placement, congestive heart failure, respiratory failure, renal failure, pneumonia, infection, other late complications related to structural valve deterioration or migration, or other complications that might ultimately cause a temporary or permanent loss of functional independence or other long term morbidity.  The patient provides full informed consent for the procedure as described and all questions were answered preoperatively.    DETAILS OF THE OPERATIVE PROCEDURE  PREPARATION:    The patient is brought to the operating room on the above mentioned date and central monitoring was established by the anesthesia team including placement of Swan-Ganz catheter and radial arterial line. The patient is placed in the supine position on the operating table.  Intravenous antibiotics are administered. General endotracheal anesthesia is induced uneventfully. A Foley catheter is placed.  Baseline transesophageal echocardiogram was performed. The patient's chest, abdomen, both groins, and both lower extremities are prepared and draped in a sterile manner. A time out procedure is performed.   PERIPHERAL ACCESS:    Using the modified Seldinger technique, femoral arterial and venous access was obtained with placement of 6 Fr sheaths on the right side.  A pigtail diagnostic catheter was passed through the right femoral arterial sheath under fluoroscopic guidance into the aortic root.  A temporary transvenous pacemaker catheter was passed through the right  femoral venous sheath under fluoroscopic guidance into the right ventricle.  The pacemaker was tested to ensure stable lead placement and pacemaker capture. Aortic root angiography was performed in order to determine the optimal angiographic angle for valve deployment.   TRANSFEMORAL ACCESS:   A left femoral arterial cutdown was performed by Dr Cyndia Bent. Please see his separate operative note for details. The patient was heparinized systemically and ACT verified > 250 seconds.    A 14 Fr transfemoral E-sheath was introduced into the left femoral artery after progressively dilating over an Amplatz superstiff wire. An AL1 catheter was used to direct a straight-tip exchange length wire across the native aortic valve into the left ventricle. This was exchanged out for a pigtail catheter and position was confirmed in the LV apex. Simultaneous LV and Ao pressures were recorded.  The pigtail catheter was then exchanged for an Amplatz Extra-stiff wire in the LV apex. At that point, BAV was performed using a 20 mm valvuloplasty balloon.  Once optimal position was achieved, BAV was done under rapid ventricular pacing at 180 bpm. The patient recovered well hemodynamically.   TRANSCATHETER HEART VALVE DEPLOYMENT:  An Edwards Sapien 3 THV (size 23 mm) was prepared and crimped per manufacturer's guidelines, and the proper orientation of the valve is confirmed on the [employer] delivery system. The valve was advanced through the introducer sheath using normal technique until in an appropriate position in the abdominal aorta beyond the sheath tip. The balloon was then retracted and using the fine-tuning wheel was centered on the valve. The valve was then advanced across the aortic arch using appropriate flexion of the catheter. The valve was carefully positioned across the aortic valve annulus. The Commander catheter was retracted using normal technique. Once final position of the valve has  been confirmed by  angiographic assessment, the valve is deployed while temporarily holding ventilation and during rapid ventricular pacing to maintain systolic blood pressure < 50 mmHg and pulse pressure < 10 mmHg. The balloon inflation is held for >3 seconds after reaching full deployment volume. Once the balloon has fully deflated the balloon is retracted into the ascending aorta and valve function is assessed using TEE. There is felt to be trivial paravalvular leak and no central aortic insufficiency.  The patient's hemodynamic recovery following valve deployment is good.  The deployment balloon and guidewire are both removed. Echo demostrated acceptable post-procedural gradients, stable mitral valve function, and trivial AI.   PROCEDURE COMPLETION:  The sheath was then removed and arteriotomy repaired by Dr Cyndia Bent. Please see his separate report for details. Protamine was administered once femoral arterial repair was complete. The temporary pacemaker, pigtail catheters and femoral sheaths were removed with manual pressure used for hemostasis.   The patient tolerated the procedure well and is transported to the surgical intensive care in stable condition. There were no immediate intraoperative complications. All sponge instrument and needle counts are verified correct at completion of the operation.   No blood products were administered during the operation.  The patient received a total of 59.6 mL of intravenous contrast during the procedure.  Kadijah Shamoon MD 05/26/2015 10:11 AM

## 2015-05-26 NOTE — Progress Notes (Signed)
  Echocardiogram Echocardiogram Transesophageal has been performed.  Darlina Sicilian M 05/26/2015, 10:02 AM

## 2015-05-26 NOTE — Anesthesia Preprocedure Evaluation (Addendum)
Anesthesia Evaluation  Patient identified by MRN, date of birth, ID band Patient awake    Reviewed: Allergy & Precautions, H&P , NPO status , Patient's Chart, lab work & pertinent test results  Airway Mallampati: III  TM Distance: >3 FB Neck ROM: Full    Dental no notable dental hx. (+) Edentulous Upper, Edentulous Lower, Dental Advisory Given   Pulmonary shortness of breath,  breath sounds clear to auscultation  Pulmonary exam normal       Cardiovascular hypertension, Pt. on medications and Pt. on home beta blockers + CAD and +CHF + Valvular Problems/Murmurs AS Rhythm:Regular Rate:Normal + Systolic murmurs    Neuro/Psych Anxiety negative neurological ROS  negative psych ROS   GI/Hepatic Neg liver ROS, PUD, GERD-  Medicated and Controlled,  Endo/Other  diabetes, Type 2, Oral Hypoglycemic AgentsMorbid obesity  Renal/GU negative Renal ROS  negative genitourinary   Musculoskeletal  (+) Arthritis -, Osteoarthritis,    Abdominal   Peds  Hematology negative hematology ROS (+) anemia ,   Anesthesia Other Findings   Reproductive/Obstetrics negative OB ROS                            Anesthesia Physical Anesthesia Plan  ASA: IV  Anesthesia Plan: General   Post-op Pain Management:    Induction: Intravenous  Airway Management Planned: Oral ETT  Additional Equipment: Arterial line, CVP, PA Cath and Ultrasound Guidance Line Placement  Intra-op Plan:   Post-operative Plan: Extubation in OR and Possible Post-op intubation/ventilation  Informed Consent: I have reviewed the patients History and Physical, chart, labs and discussed the procedure including the risks, benefits and alternatives for the proposed anesthesia with the patient or authorized representative who has indicated his/her understanding and acceptance.   Dental advisory given  Plan Discussed with: CRNA  Anesthesia Plan Comments:          Anesthesia Quick Evaluation

## 2015-05-26 NOTE — Anesthesia Postprocedure Evaluation (Signed)
  Anesthesia Post-op Note  Patient: Katherine [name]  Procedure(s) Performed: Procedure(s): TRANSCATHETER AORTIC VALVE REPLACEMENT, TRANSFEMORAL (N/A) TRANSESOPHAGEAL ECHOCARDIOGRAM (TEE) (N/A)  Patient Location: ICU  Anesthesia Type:General  Level of Consciousness: awake, alert  and oriented  Airway and Oxygen Therapy: Patient Spontanous Breathing and Patient connected to nasal cannula oxygen  Post-op Pain: none  Post-op Assessment: Post-op Vital signs reviewed, Patient's Cardiovascular Status Stable and Respiratory Function Stable              Post-op Vital Signs: Reviewed and stable  Last Vitals:  Filed Vitals:   05/26/15 1100  BP: 151/124  Pulse: 91  Temp:   Resp: 39    Complications: No apparent anesthesia complications

## 2015-05-27 ENCOUNTER — Inpatient Hospital Stay (HOSPITAL_COMMUNITY): Payer: Medicare Other

## 2015-05-27 ENCOUNTER — Encounter (HOSPITAL_COMMUNITY): Payer: Self-pay | Admitting: Cardiovascular Disease

## 2015-05-27 DIAGNOSIS — I359 Nonrheumatic aortic valve disorder, unspecified: Secondary | ICD-10-CM

## 2015-05-27 DIAGNOSIS — I10 Essential (primary) hypertension: Secondary | ICD-10-CM

## 2015-05-27 DIAGNOSIS — R061 Stridor: Secondary | ICD-10-CM

## 2015-05-27 DIAGNOSIS — I350 Nonrheumatic aortic (valve) stenosis: Principal | ICD-10-CM

## 2015-05-27 LAB — CBC
HCT: 29.7 % — ABNORMAL LOW (ref 36.0–46.0)
Hemoglobin: 10.3 g/dL — ABNORMAL LOW (ref 12.0–15.0)
MCH: 27 pg (ref 26.0–34.0)
MCHC: 34.7 g/dL (ref 30.0–36.0)
MCV: 78 fL (ref 78.0–100.0)
Platelets: 231 10*3/uL (ref 150–400)
RBC: 3.81 MIL/uL — ABNORMAL LOW (ref 3.87–5.11)
RDW: 14.5 % (ref 11.5–15.5)
WBC: 11.3 10*3/uL — ABNORMAL HIGH (ref 4.0–10.5)

## 2015-05-27 LAB — BASIC METABOLIC PANEL
Anion gap: 7 (ref 5–15)
BUN: 9 mg/dL (ref 6–20)
CALCIUM: 9.4 mg/dL (ref 8.9–10.3)
CO2: 26 mmol/L (ref 22–32)
CREATININE: 0.76 mg/dL (ref 0.44–1.00)
Chloride: 104 mmol/L (ref 101–111)
GFR calc Af Amer: >60 mL/min (ref >60)
Glucose, Bld: 140 mg/dL — ABNORMAL HIGH (ref 65–99)
POTASSIUM: 3.6 mmol/L (ref 3.5–5.1)
SODIUM: 137 mmol/L (ref 135–145)

## 2015-05-27 LAB — GLUCOSE, CAPILLARY
GLUCOSE-CAPILLARY: 113 mg/dL — AB (ref 65–99)
GLUCOSE-CAPILLARY: 120 mg/dL — AB (ref 65–99)
GLUCOSE-CAPILLARY: 135 mg/dL — AB (ref 65–99)
GLUCOSE-CAPILLARY: 98 mg/dL (ref 65–99)
Glucose-Capillary: 100 mg/dL — ABNORMAL HIGH (ref 65–99)
Glucose-Capillary: 110 mg/dL — ABNORMAL HIGH (ref 65–99)
Glucose-Capillary: 115 mg/dL — ABNORMAL HIGH (ref 65–99)
Glucose-Capillary: 117 mg/dL — ABNORMAL HIGH (ref 65–99)
Glucose-Capillary: 118 mg/dL — ABNORMAL HIGH (ref 65–99)
Glucose-Capillary: 131 mg/dL — ABNORMAL HIGH (ref 65–99)
Glucose-Capillary: 135 mg/dL — ABNORMAL HIGH (ref 65–99)
Glucose-Capillary: 137 mg/dL — ABNORMAL HIGH (ref 65–99)
Glucose-Capillary: 140 mg/dL — ABNORMAL HIGH (ref 65–99)
Glucose-Capillary: 140 mg/dL — ABNORMAL HIGH (ref 65–99)
Glucose-Capillary: 146 mg/dL — ABNORMAL HIGH (ref 65–99)
Glucose-Capillary: 171 mg/dL — ABNORMAL HIGH (ref 65–99)
Glucose-Capillary: 204 mg/dL — ABNORMAL HIGH (ref 65–99)
Glucose-Capillary: 83 mg/dL (ref 65–99)
Glucose-Capillary: 89 mg/dL (ref 65–99)

## 2015-05-27 LAB — MAGNESIUM
MAGNESIUM: 1.7 mg/dL (ref 1.7–2.4)
Magnesium: 1.1 mg/dL — ABNORMAL LOW (ref 1.7–2.4)

## 2015-05-27 MED ORDER — INSULIN ASPART 100 UNIT/ML ~~LOC~~ SOLN
0.0000 [IU] | SUBCUTANEOUS | Status: DC
  Administered 2015-05-27: 4 [IU] via SUBCUTANEOUS
  Administered 2015-05-27: 8 [IU] via SUBCUTANEOUS
  Administered 2015-05-28: 2 [IU] via SUBCUTANEOUS
  Administered 2015-05-28: 4 [IU] via SUBCUTANEOUS
  Administered 2015-05-28: 2 [IU] via SUBCUTANEOUS
  Administered 2015-05-28: 8 [IU] via SUBCUTANEOUS
  Administered 2015-05-28: 4 [IU] via SUBCUTANEOUS
  Administered 2015-05-28 – 2015-05-29 (×2): 2 [IU] via SUBCUTANEOUS
  Administered 2015-05-29: 8 [IU] via SUBCUTANEOUS
  Administered 2015-05-29: 12 [IU] via SUBCUTANEOUS
  Administered 2015-05-30: 8 [IU] via SUBCUTANEOUS
  Administered 2015-05-30: 2 [IU] via SUBCUTANEOUS
  Administered 2015-05-30 (×3): 4 [IU] via SUBCUTANEOUS
  Administered 2015-05-31 (×3): 2 [IU] via SUBCUTANEOUS
  Administered 2015-05-31: 20 [IU] via SUBCUTANEOUS
  Administered 2015-05-31: 2 [IU] via SUBCUTANEOUS
  Administered 2015-06-01: 4 [IU] via SUBCUTANEOUS
  Administered 2015-06-01 (×2): 2 [IU] via SUBCUTANEOUS
  Administered 2015-06-01: 4 [IU] via SUBCUTANEOUS
  Administered 2015-06-02: 8 [IU] via SUBCUTANEOUS
  Administered 2015-06-02: 12 [IU] via SUBCUTANEOUS
  Administered 2015-06-03: 2 [IU] via SUBCUTANEOUS

## 2015-05-27 MED ORDER — INSULIN DETEMIR 100 UNIT/ML ~~LOC~~ SOLN
20.0000 [IU] | Freq: Two times a day (BID) | SUBCUTANEOUS | Status: DC
  Administered 2015-05-27 – 2015-06-01 (×10): 20 [IU] via SUBCUTANEOUS
  Filled 2015-05-27 (×13): qty 0.2

## 2015-05-27 MED ORDER — POTASSIUM CHLORIDE 10 MEQ/50ML IV SOLN
10.0000 meq | INTRAVENOUS | Status: AC | PRN
  Administered 2015-05-26 – 2015-05-27 (×4): 10 meq via INTRAVENOUS
  Filled 2015-05-27: qty 50

## 2015-05-27 MED ORDER — HYDRALAZINE HCL 20 MG/ML IJ SOLN
20.0000 mg | Freq: Three times a day (TID) | INTRAMUSCULAR | Status: DC
  Administered 2015-05-27 (×3): 20 mg via INTRAVENOUS
  Filled 2015-05-27 (×6): qty 1

## 2015-05-27 MED ORDER — ENOXAPARIN SODIUM 40 MG/0.4ML ~~LOC~~ SOLN
40.0000 mg | SUBCUTANEOUS | Status: DC
  Administered 2015-05-27 – 2015-06-01 (×6): 40 mg via SUBCUTANEOUS
  Filled 2015-05-27 (×8): qty 0.4

## 2015-05-27 MED ORDER — NEBIVOLOL HCL 10 MG PO TABS
10.0000 mg | ORAL_TABLET | Freq: Every day | ORAL | Status: DC
  Administered 2015-05-27 – 2015-06-03 (×8): 10 mg via ORAL
  Filled 2015-05-27 (×8): qty 1

## 2015-05-27 MED ORDER — POTASSIUM CHLORIDE 10 MEQ/50ML IV SOLN
INTRAVENOUS | Status: AC
  Administered 2015-05-27: 10 meq via INTRAVENOUS
  Filled 2015-05-27: qty 50

## 2015-05-27 MED ORDER — MAGNESIUM SULFATE 2 GM/50ML IV SOLN
2.0000 g | Freq: Once | INTRAVENOUS | Status: AC
  Administered 2015-05-27: 2 g via INTRAVENOUS
  Filled 2015-05-27: qty 50

## 2015-05-27 NOTE — Progress Notes (Signed)
1 Day Post-Op Procedure(s) (LRB): TRANSCATHETER AORTIC VALVE REPLACEMENT, TRANSFEMORAL (N/A) TRANSESOPHAGEAL ECHOCARDIOGRAM (TEE) (N/A) Subjective:  No complaints  Reported to have some stridor overnight but I think it is just upper airway noise related to obesity and sedation.She is snoring at this time.  Objective: Vital signs in last 24 hours: Temp:  [97.2 F (36.2 C)-99.3 F (37.4 C)] 99.3 F (37.4 C) (06/22 0746) Pulse Rate:  [76-119] 91 (06/22 0700) Cardiac Rhythm:  [-] Normal sinus rhythm (06/22 0400) Resp:  [16-39] 25 (06/22 0700) BP: (93-158)/(37-137) 125/61 mmHg (06/22 0700) SpO2:  [88 %-100 %] 98 % (06/22 0700) Arterial Line BP: (108-206)/(28-76) 157/59 mmHg (06/22 0700) Weight:  [107 kg (235 lb 14.3 oz)] 107 kg (235 lb 14.3 oz) (06/22 0500)  Hemodynamic parameters for last 24 hours: PAP: (19-75)/(0-30) 26/6 mmHg CO:  [6.3 L/min-8 L/min] 7.7 L/min CI:  [3 L/min/m2-3.9 L/min/m2] 3.8 L/min/m2  Intake/Output from previous day: 06/21 0701 - 06/22 0700 In: 5266.4 [I.V.:4216.4; IV Piggyback:1050] Out: [id number] [Urine:1740; Blood:50] Intake/Output this shift:    General appearance: lethargic but arouses and follow commands Neurologic: intact Heart: regular rate and rhythm, S1, S2 normal, no murmur, click, rub or gallop Lungs: clear to auscultation bilaterally Wound: left groin incision ok.   Lab Results:  Recent Labs  05/26/15 1750 05/26/15 1755 05/27/15 0400  WBC 10.6*  --  11.3*  HGB 10.9* 11.2* 10.3*  HCT 31.3* 33.0* 29.7*  PLT 258  --  231   BMET:  Recent Labs  05/26/15 1755 05/27/15 0400  NA 140 137  K 4.4 3.6  CL 101 104  CO2  --  26  GLUCOSE 118* 140*  BUN 12 9  CREATININE 0.60 0.76  CALCIUM  --  9.4    PT/INR:  Recent Labs  05/26/15 1105  LABPROT 15.1  INR 1.17   ABG    Component Value Date/Time   PHART 7.378 05/26/2015 1107   HCO3 26.7* 05/26/2015 1107   TCO2 26 05/26/2015 1755   O2SAT 91.0 05/26/2015 1107   CBG (last 3)    Recent Labs  05/26/15 1753 05/26/15 1857 05/26/15 1956  GLUCAP 117* 151* 125*    Assessment/Plan: S/P Procedure(s) (LRB): TRANSCATHETER AORTIC VALVE REPLACEMENT, TRANSFEMORAL (N/A) TRANSESOPHAGEAL ECHOCARDIOGRAM (TEE) (N/A)  Hypertensive postop TAVR. Meds adjusted by cardiology.  DC sedatives and narcotics from [facility]. Will give tylenol for pain as needed.  Mobilize out of bed today.  PT consult  Diabetes: glucose controlled on insulin drip. Will start Levemir and SSI and stop drip.  Echo today     LOS: 1 day    Gaye Pollack 05/27/2015

## 2015-05-27 NOTE — Progress Notes (Addendum)
     SUBJECTIVE: No complaints this am.   BP 125/61 mmHg  Pulse 91  Temp(Src) 99.3 F (37.4 C) (Core (Comment))  Resp 25  Ht 5\' 2"  (1.575 m)  Wt 235 lb 14.3 oz (107 kg)  BMI 43.13 kg/m2  SpO2 98%  Intake/Output Summary (Last 24 hours) at 05/27/15 0725 Last data filed at 05/27/15 0700  Gross per 24 hour  Intake 5266.43 ml  Output   1790 ml  Net 3476.43 ml    PHYSICAL EXAM General: Obese female, awake and alert. Oriented x 3. NAD Psych:  Good affect, responds appropriately Lungs: Clear bilaterally with no wheezes or rhonci noted.  Heart: RRR with no murmurs noted. Abdomen: Bowel sounds are present. Soft, non-tender.  Extremities: No lower extremity edema. Both groins are soft  LABS: Basic Metabolic Panel:  Recent Labs  05/26/15 1750 05/26/15 1755 05/27/15 0400  NA  --  140 137  K  --  4.4 3.6  CL  --  101 104  CO2  --   --  26  GLUCOSE  --  118* 140*  BUN  --  12 9  CREATININE 0.62 0.60 0.76  CALCIUM  --   --  9.4  MG 1.1*  --  1.1*   CBC:  Recent Labs  05/26/15 1750 05/26/15 1755 05/27/15 0400  WBC 10.6*  --  11.3*  HGB 10.9* 11.2* 10.3*  HCT 31.3* 33.0* 29.7*  MCV 77.9*  --  78.0  PLT 258  --  231    Current Meds: . sodium chloride   Intravenous Once  . acetaminophen  1,000 mg Oral 4 times per day   Or  . acetaminophen (TYLENOL) oral liquid 160 mg/5 mL  1,000 mg Per Tube 4 times per day  . antiseptic oral rinse  7 mL Mouth Rinse BID  . aspirin EC  81 mg Oral Daily   Or  . aspirin  324 mg Per Tube Daily  . cefUROXime (ZINACEF)  IV  1.5 g Intravenous Q12H  . clopidogrel  600 mg Per NG tube Once  . clopidogrel  75 mg Oral Q breakfast  . erythromycin  1 application Right Eye [id number]  . famotidine (PEPCID) IV  20 mg Intravenous Q12H  . furosemide  40 mg Oral Daily  . insulin regular  0-10 Units Intravenous TID WC  . loratadine  10 mg Oral Daily  . memantine  28 mg Oral q morning - 10a  . metoprolol tartrate  12.5 mg Oral BID   Or  . metoprolol  tartrate  12.5 mg Per Tube BID  . [START ON 05/28/2015] pantoprazole  40 mg Oral Daily  . potassium chloride      . rosuvastatin  20 mg Oral QHS    ASSESSMENT AND PLAN:  1. Severe aortic valve stenosis: POD #1 s/p TAVR.  Continue Plavix. Will stop ASA. Echo pending today.   2. HTN: Will add scheduled IV hydralazine today and attempt to wean Nipride off. Continue IV NTG. Will restart her home dose of bystolic 10 mg daily and stop Lopressor.   3. Stridor: Likely due to vocal cord dysfunction post extubation. No audible stridor today. Will follow in ICU today.   4. DM: Resume home meds when taking po well.   D/c arterial line, SWAN. Up to chair today. Ambulate if possible.   Katherine [name]  6/22/20167:25 AM

## 2015-05-27 NOTE — Significant Event (Signed)
I was called by the bedside ICU nurse with concerns of stridor. CT nurse Dowel note for details.  My impression patient likely has woke cord dysfunction secondary to recent intubation. ENT consult was placed (ENT service was not paged). Patient is able to protect her airway at the moment. We'll continue to monitor.

## 2015-05-27 NOTE — Progress Notes (Signed)
Upon 0000 assessment patient stated she had difficulty breathing and sounded stridorous in her neck.  RT was called and a breathing treatment was administered, as well as work with the IS and flutter valve.  These interventions did not seem to help so the Cardiology on call Fellow was paged and came to assess the patient at bedside.  Racemic Epi was discussed, but due to the patient's HR and BP, and the fact that the patient is able to cough and speak, it was decided that it would be best to just monitor the patient.  No further orders were given.  While in the room MD Lishmanov did pull back the swan from 53 to 48.  Will continue to monitor the patient carefully.  Naida Sleight, RN.

## 2015-05-27 NOTE — Progress Notes (Signed)
  Echocardiogram 2D Echocardiogram has been performed.  Katherine [name] 05/27/2015, 1:33 PM

## 2015-05-27 NOTE — Progress Notes (Signed)
Patient ID: Katherine [name], female   DOB: 03-Jan-1928, 81 y.o.   MRN: [id number] SICU Evening Rounds:  Hemodynamically stable in sinus rhythm.  OOB and ambulating a little  Ate some dinner with nurses help  Still very sleepy.  Urine output ok

## 2015-05-27 NOTE — Evaluation (Signed)
Physical Therapy Evaluation Patient Details Name: Katherine [name] MRN: [id number] DOB: [birth year] Today's Date: 05/27/2015   History of Present Illness  Pt atmitted 6/21 for SOB and DOE. Pt found to have severe aortic stenosis. Pt with transcatheter aortic valve replacement -left transfemoral apprach on 6/21.  Clinical Impression  Pt with impaired cognition, impaired vision, generalized weakness, impaired balance and decreased activity tolerance. Pt requiring assist for all mobility. Pt to benefit from ST-SNF to improve independence with mobility for safe transition home.    Follow Up Recommendations SNF    Equipment Recommendations  None recommended by PT    Recommendations for Other Services       Precautions / Restrictions Precautions Precautions: Fall Restrictions Weight Bearing Restrictions: No      Mobility  Bed Mobility Overal bed mobility: Needs Assistance Bed Mobility: Rolling;Sidelying to Sit Rolling: Mod assist;+2 for physical assistance Sidelying to sit: Mod assist;+2 for physical assistance       General bed mobility comments: assist to achieve sidelying, assist for LE management and trunk elevation  Transfers Overall transfer level: Needs assistance Equipment used: Rolling walker (2 wheeled) Transfers: Sit to/from [facility] Sit to Stand: Mod assist;+2 physical assistance Stand pivot transfers: Mod assist;+2 physical assistance       General transfer comment: pt extremely slow to process, modA to initiate anterior weight shift and raise bottom of bed. max tactile cues to std pvt to chair with maxA for walker management and tactile cues to weight shift to step. RN watched IV lines  Ambulation/Gait Ambulation/Gait assistance:  (unable) Ambulation Distance (Feet):  ( shuffled steps to chair)            Stairs            Wheelchair Mobility    Modified Rankin (Stroke Patients Only)       Balance Overall balance  assessment: Needs assistance Sitting-balance support: Feet supported;Bilateral upper extremity supported Sitting balance-Leahy Scale: Poor Sitting balance - Comments: modA to maintain upright posture Postural control: Posterior lean Standing balance support: During functional activity Standing balance-Leahy Scale: Poor Standing balance comment: requires physical assist and RW to maintain upright standing                             Pertinent Vitals/Pain Pain Assessment: No/denies pain    Home Living Family/patient expects to be discharged to:: Skilled nursing facility Living Arrangements: Children               Additional Comments: pt lives in 1 story home with ramp but is alone most of the day.     Prior Function Level of Independence: Needs assistance   Gait / Transfers Assistance Needed: amb about <50 feet with use of RW and/or cane  ADL's / Homemaking Assistance Needed: assist for dressing/bathing  Comments: pt had HHA 3hrs/day     Hand Dominance   Dominant Hand: Right    Extremity/Trunk Assessment   Upper Extremity Assessment: Generalized weakness           Lower Extremity Assessment: Generalized weakness      Cervical / Trunk Assessment: Normal  Communication   Communication: HOH  Cognition Arousal/Alertness: Awake/alert Behavior During Therapy: WFL for tasks assessed/performed Overall Cognitive Status: Impaired/Different from baseline Area of Impairment: Orientation;Attention;Memory;Problem solving Orientation Level: Disoriented to;Place;Time;Situation Current Attention Level: Focused Memory: Decreased short-term memory       Problem Solving: Slow processing;Difficulty sequencing;Requires verbal cues;Requires tactile cues General  Comments: pt reports she's at her house and lives with her sister but actually lives with daughter. unable to derive she was in hospital    General Comments General comments (skin integrity, edema, etc.):  pt had impaired vision as noted when trying to report how many fingers PT was holding up. Pt with noted sty in eye however pt unable to track or reach for hand acurately    Exercises        Assessment/Plan    PT Assessment Patient needs continued PT services  PT Diagnosis Generalized weakness;Difficulty walking   PT Problem List Decreased strength;Decreased range of motion;Decreased activity tolerance;Decreased balance;Decreased mobility;Obesity;Decreased cognition  PT Treatment Interventions DME instruction;Gait training;Functional mobility training;Therapeutic activities;Therapeutic exercise   PT Goals (Current goals can be found in the Care Plan section) Acute Rehab PT Goals Patient Stated Goal: didn't state PT Goal Formulation: With patient/family Time For Goal Achievement: 06/03/15 Potential to Achieve Goals: Good    Frequency Min 3X/week   Barriers to discharge Decreased caregiver support alone most of the day    Co-evaluation               End of Session Equipment Utilized During Treatment: Gait belt;Oxygen (2Lo2 via [place]) Activity Tolerance: Patient tolerated treatment well Patient left: in chair;with call bell/phone within reach;with nursing/sitter in room;with family/visitor present Nurse Communication: Mobility status         Time: [id number] PT Time Calculation (min) (ACUTE ONLY): 25 min   Charges:   PT Evaluation $Initial PT Evaluation Tier I: 1 Procedure PT Treatments $Therapeutic Activity: 8-22 mins   PT G CodesKingsley Callander 05/27/2015, 2:34 PM   Kittie Plater, PT, DPT Pager #: [phone] Office #: [phone]

## 2015-05-28 LAB — CBC
HEMATOCRIT: 32.1 % — AB (ref 36.0–46.0)
Hemoglobin: 11.3 g/dL — ABNORMAL LOW (ref 12.0–15.0)
MCH: 27.3 pg (ref 26.0–34.0)
MCHC: 35.2 g/dL (ref 30.0–36.0)
MCV: 77.5 fL — ABNORMAL LOW (ref 78.0–100.0)
Platelets: 202 10*3/uL (ref 150–400)
RBC: 4.14 MIL/uL (ref 3.87–5.11)
RDW: 14.5 % (ref 11.5–15.5)
WBC: 12.3 10*3/uL — AB (ref 4.0–10.5)

## 2015-05-28 LAB — BASIC METABOLIC PANEL
ANION GAP: 7 (ref 5–15)
BUN: 9 mg/dL (ref 6–20)
CALCIUM: 9.9 mg/dL (ref 8.9–10.3)
CO2: 27 mmol/L (ref 22–32)
Chloride: 103 mmol/L (ref 101–111)
Creatinine, Ser: 0.73 mg/dL (ref 0.44–1.00)
Glucose, Bld: 153 mg/dL — ABNORMAL HIGH (ref 65–99)
Potassium: 3.6 mmol/L (ref 3.5–5.1)
Sodium: 137 mmol/L (ref 135–145)

## 2015-05-28 LAB — GLUCOSE, CAPILLARY
GLUCOSE-CAPILLARY: 130 mg/dL — AB (ref 65–99)
GLUCOSE-CAPILLARY: 160 mg/dL — AB (ref 65–99)
GLUCOSE-CAPILLARY: 175 mg/dL — AB (ref 65–99)
GLUCOSE-CAPILLARY: 169 mg/dL — AB (ref 65–99)
Glucose-Capillary: 115 mg/dL — ABNORMAL HIGH (ref 65–99)
Glucose-Capillary: 204 mg/dL — ABNORMAL HIGH (ref 65–99)

## 2015-05-28 LAB — MAGNESIUM: MAGNESIUM: 1.7 mg/dL (ref 1.7–2.4)

## 2015-05-28 MED ORDER — LISINOPRIL 10 MG PO TABS
10.0000 mg | ORAL_TABLET | Freq: Every day | ORAL | Status: DC
  Administered 2015-05-28 – 2015-05-29 (×2): 10 mg via ORAL
  Filled 2015-05-28 (×4): qty 1

## 2015-05-28 MED ORDER — POTASSIUM CHLORIDE CRYS ER 20 MEQ PO TBCR
40.0000 meq | EXTENDED_RELEASE_TABLET | Freq: Once | ORAL | Status: AC
  Administered 2015-05-28: 40 meq via ORAL
  Filled 2015-05-28: qty 2

## 2015-05-28 MED ORDER — GUAIFENESIN ER 600 MG PO TB12
600.0000 mg | ORAL_TABLET | Freq: Two times a day (BID) | ORAL | Status: DC
  Administered 2015-05-29 – 2015-06-03 (×11): 600 mg via ORAL
  Filled 2015-05-28 (×13): qty 1

## 2015-05-28 MED ORDER — POTASSIUM CHLORIDE 10 MEQ/50ML IV SOLN
10.0000 meq | INTRAVENOUS | Status: AC
  Administered 2015-05-28 (×3): 10 meq via INTRAVENOUS
  Filled 2015-05-28 (×3): qty 50

## 2015-05-28 MED ORDER — HYDRALAZINE HCL 50 MG PO TABS
50.0000 mg | ORAL_TABLET | Freq: Three times a day (TID) | ORAL | Status: DC
Start: 2015-05-28 — End: 2015-06-03
  Administered 2015-05-28 – 2015-06-03 (×17): 50 mg via ORAL
  Filled 2015-05-28 (×22): qty 1

## 2015-05-28 MED FILL — Dextrose Inj 5%: INTRAVENOUS | Qty: 250 | Status: AC

## 2015-05-28 MED FILL — Potassium Chloride Inj 2 mEq/ML: INTRAVENOUS | Qty: 40 | Status: AC

## 2015-05-28 MED FILL — Magnesium Sulfate Inj 50%: INTRAMUSCULAR | Qty: 10 | Status: AC

## 2015-05-28 MED FILL — Heparin Sodium (Porcine) Inj 1000 Unit/ML: INTRAMUSCULAR | Qty: 30 | Status: AC

## 2015-05-28 MED FILL — Phenylephrine HCl Inj 10 MG/ML: INTRAMUSCULAR | Qty: 2 | Status: AC

## 2015-05-28 NOTE — Progress Notes (Signed)
SUBJECTIVE: No complaints. No events.   BP 141/59 mmHg  Pulse 100  Temp(Src) 99.2 F (37.3 C) (Oral)  Resp 20  Ht 5\' 2"  (1.575 m)  Wt 232 lb 12.9 oz (105.6 kg)  BMI 42.57 kg/m2  SpO2 96%  Intake/Output Summary (Last 24 hours) at 05/28/15 0724 Last data filed at 05/28/15 0600  Gross per 24 hour  Intake 931.94 ml  Output   2055 ml  Net -1123.06 ml    PHYSICAL EXAM General: Well developed, well nourished, in no acute distress. Alert and oriented x 3.  Psych:  Good affect, responds appropriately Neck: No JVD. No masses noted.  Lungs: Clear bilaterally with no wheezes or rhonci noted.  Heart: RRR with no murmurs noted. Abdomen: Bowel sounds are present. Soft, non-tender.  Extremities: No lower extremity edema.   LABS: Basic Metabolic Panel:  Recent Labs  05/27/15 0400 05/27/15 1349 05/28/15 0315  NA 137  --  137  K 3.6  --  3.6  CL 104  --  103  CO2 26  --  27  GLUCOSE 140*  --  153*  BUN 9  --  9  CREATININE 0.76  --  0.73  CALCIUM 9.4  --  9.9  MG 1.1* 1.7 1.7   CBC:  Recent Labs  05/27/15 0400 05/28/15 0315  WBC 11.3* 12.3*  HGB 10.3* 11.3*  HCT 29.7* 32.1*  MCV 78.0 77.5*  PLT 231 202    Current Meds: . acetaminophen  1,000 mg Oral 4 times per day   Or  . acetaminophen (TYLENOL) oral liquid 160 mg/5 mL  1,000 mg Per Tube 4 times per day  . antiseptic oral rinse  7 mL Mouth Rinse BID  . clopidogrel  600 mg Per NG tube Once  . clopidogrel  75 mg Oral Q breakfast  . enoxaparin (LOVENOX) injection  40 mg Subcutaneous Q24H  . erythromycin  1 application Right Eye [id number]  . furosemide  40 mg Oral Daily  . hydrALAZINE  20 mg Intravenous TID  . insulin aspart  0-24 Units Subcutaneous 6 times per day  . insulin detemir  20 Units Subcutaneous BID  . loratadine  10 mg Oral Daily  . memantine  28 mg Oral q morning - 10a  . nebivolol  10 mg Oral Daily  . pantoprazole  40 mg Oral Daily  . potassium chloride  10 mEq Intravenous Q1 Hr x 3  .  rosuvastatin  20 mg Oral QHS   Echo 05/27/15: Left ventricle: Small hyperdynamic ventricle with no SAM but small LVOT gradient. The cavity size was normal. There was moderate concentric hypertrophy. Systolic function was vigorous. The estimated ejection fraction was in the range of 65% to 70%. - Aortic valve: Normal appearing 23 mm Sapien 3 stented valve No significant peri valvular regurgitations Mean gradient 14 mm Hg a bit higher than OR may be related to hyperdynamic function Valve area (VTI): 1.63 cm^2. Valve area (Vmax): 1.39 cm^2. Valve area (Vmean): 1.55 cm^2. - Mitral valve: Calcified annulus. There was mild regurgitation. Valve area by continuity equation (using LVOT flow): 1.82 cm^2. - Left atrium: The atrium was moderately dilated. - Right atrium: The atrium was mildly dilated. - Atrial septum: No defect or patent foramen ovale was identified. - Tricuspid valve: There was moderate regurgitation. - Pulmonary arteries: PA peak pressure: 53 mm Hg (S). - Pericardium, extracardiac: A trivial pericardial effusion was identified posterior to the heart.  ASSESSMENT AND PLAN:  1.  Severe aortic valve stenosis: POD #2 s/p TAVR. Continue Plavix.  Echo with normally functioning valve.   2. HTN: BP is better controlled but still elevated. Will change to po Hydralazine and add Lisinopril.   3. DM: SSI, home insulin regimen.   Will leave in ICU today. Will continue working with PT. Right IJ introducer out.     Katherine [name]  6/23/20167:24 AM

## 2015-05-29 DIAGNOSIS — Z954 Presence of other heart-valve replacement: Secondary | ICD-10-CM

## 2015-05-29 DIAGNOSIS — E088 Diabetes mellitus due to underlying condition with unspecified complications: Secondary | ICD-10-CM

## 2015-05-29 LAB — BASIC METABOLIC PANEL
ANION GAP: 9 (ref 5–15)
BUN: 19 mg/dL (ref 6–20)
CHLORIDE: 100 mmol/L — AB (ref 101–111)
CO2: 26 mmol/L (ref 22–32)
CREATININE: 0.86 mg/dL (ref 0.44–1.00)
Calcium: 10.2 mg/dL (ref 8.9–10.3)
GFR, EST NON AFRICAN AMERICAN: 60 mL/min — AB (ref >60)
GLUCOSE: 109 mg/dL — AB (ref 65–99)
Potassium: 4.4 mmol/L (ref 3.5–5.1)
Sodium: 135 mmol/L (ref 135–145)

## 2015-05-29 LAB — GLUCOSE, CAPILLARY
GLUCOSE-CAPILLARY: 100 mg/dL — AB (ref 65–99)
GLUCOSE-CAPILLARY: 127 mg/dL — AB (ref 65–99)
GLUCOSE-CAPILLARY: 234 mg/dL — AB (ref 65–99)
Glucose-Capillary: 204 mg/dL — ABNORMAL HIGH (ref 65–99)
Glucose-Capillary: 281 mg/dL — ABNORMAL HIGH (ref 65–99)

## 2015-05-29 LAB — TYPE AND SCREEN
ABO/RH(D): O NEG
Antibody Screen: NEGATIVE
UNIT DIVISION: 0
Unit division: 0

## 2015-05-29 MED ORDER — PNEUMOCOCCAL VAC POLYVALENT 25 MCG/0.5ML IJ INJ
0.5000 mL | INJECTION | INTRAMUSCULAR | Status: DC
  Filled 2015-05-29: qty 0.5

## 2015-05-29 NOTE — Progress Notes (Signed)
Pt moved to 2W from 2S- CSW informed by [facility] of move and the recommendation for SNF.  CSW attempted to call pt daughter, [name], concerning SNF- left message.  Domenica Reamer, [place] Social Worker [phone]

## 2015-05-29 NOTE — Progress Notes (Signed)
SUBJECTIVE: Pt mildly confused overnight but sitting in chair this am and pleasant. She is oriented this am but still mildly confused.   BP 114/52 mmHg  Pulse 78  Temp(Src) 98 F (36.7 C) (Oral)  Resp 24  Ht 5\' 2"  (1.575 m)  Wt 234 lb 5.6 oz (106.3 kg)  BMI 42.85 kg/m2  SpO2 96%  Intake/Output Summary (Last 24 hours) at 05/29/15 [id number] Last data filed at 05/29/15 0600  Gross per 24 hour  Intake    700 ml  Output    835 ml  Net   -135 ml    PHYSICAL EXAM General: Obese elderly female in no acute distress. Alert and oriented x 2  Psych:  Good affect, responds appropriately Neck: No JVD. No masses noted.  Lungs: Clear bilaterally with no wheezes or rhonci noted.  Heart: RRR with no murmurs noted. Abdomen: Bowel sounds are present. Soft, non-tender.  Extremities: No lower extremity edema.   LABS: Basic Metabolic Panel:  Recent Labs  05/27/15 1349 05/28/15 0315 05/29/15 0230  NA  --  137 135  K  --  3.6 4.4  CL  --  103 100*  CO2  --  27 26  GLUCOSE  --  153* 109*  BUN  --  9 19  CREATININE  --  0.73 0.86  CALCIUM  --  9.9 10.2  MG 1.7 1.7  --    CBC:  Recent Labs  05/27/15 0400 05/28/15 0315  WBC 11.3* 12.3*  HGB 10.3* 11.3*  HCT 29.7* 32.1*  MCV 78.0 77.5*  PLT 231 202   Current Meds: . acetaminophen  1,000 mg Oral 4 times per day   Or  . acetaminophen (TYLENOL) oral liquid 160 mg/5 mL  1,000 mg Per Tube 4 times per day  . antiseptic oral rinse  7 mL Mouth Rinse BID  . clopidogrel  600 mg Per NG tube Once  . clopidogrel  75 mg Oral Q breakfast  . enoxaparin (LOVENOX) injection  40 mg Subcutaneous Q24H  . erythromycin  1 application Right Eye [id number]  . furosemide  40 mg Oral Daily  . guaiFENesin  600 mg Oral BID  . hydrALAZINE  50 mg Oral 3 times per day  . insulin aspart  0-24 Units Subcutaneous 6 times per day  . insulin detemir  20 Units Subcutaneous BID  . lisinopril  10 mg Oral Daily  . loratadine  10 mg Oral Daily  . memantine  28 mg  Oral q morning - 10a  . nebivolol  10 mg Oral Daily  . pantoprazole  40 mg Oral Daily  . rosuvastatin  20 mg Oral QHS    Echo 05/27/15: Left ventricle: Small hyperdynamic ventricle with no SAM but small LVOT gradient. The cavity size was normal. There was moderate concentric hypertrophy. Systolic function was vigorous. The estimated ejection fraction was in the range of 65% to 70%. - Aortic valve: Normal appearing 23 mm Sapien 3 stented valve No significant peri valvular regurgitations Mean gradient 14 mm Hg a bit higher than OR may be related to hyperdynamic function Valve area (VTI): 1.63 cm^2. Valve area (Vmax): 1.39 cm^2. Valve area (Vmean): 1.55 cm^2. - Mitral valve: Calcified annulus. There was mild regurgitation. Valve area by continuity equation (using LVOT flow): 1.82 cm^2. - Left atrium: The atrium was moderately dilated. - Right atrium: The atrium was mildly dilated. - Atrial septum: No defect or patent foramen ovale was identified. - Tricuspid valve: There was  moderate regurgitation. - Pulmonary arteries: PA peak pressure: 53 mm Hg (S). - Pericardium, extracardiac: A trivial pericardial effusion was identified posterior to the heart.  ASSESSMENT AND PLAN:  1. Severe aortic valve stenosis: POD #3 s/p TAVR. Continue Plavix.Will use Plavix alone without ASA given advanced age. Echo with normally functioning valve 05/27/15.   2. HTN: BP is better controlled over last 24 hours. Last BP check this am is elevated but receiving am meds now. Continue current medications.    3. DM: Blood sugars well controlled. Advance to home insulin dosages as po intake increases.    4. Dispo: Transfer to 2W today in camera room/may need sitter. Will ask case management to see today. Will likely need SNF placement.     Katherine [name]  6/24/20166:48 AM

## 2015-05-29 NOTE — Progress Notes (Signed)
3 Days Post-Op Procedure(s) (LRB): TRANSCATHETER AORTIC VALVE REPLACEMENT, TRANSFEMORAL (N/A) TRANSESOPHAGEAL ECHOCARDIOGRAM (TEE) (N/A) Subjective:  No complaints. Says she got a little confused earlier and did not know where she was but says she is fine now. Breathing ok  Objective: Vital signs in last 24 hours: Temp:  [97.8 F (36.6 C)-98.6 F (37 C)] 98 F (36.7 C) (06/24 0349) Pulse Rate:  [66-102] 88 (06/24 0700) Cardiac Rhythm:  [-] Normal sinus rhythm (06/24 0600) Resp:  [19-31] 26 (06/24 0700) BP: (99-182)/(48-119) 182/87 mmHg (06/24 0700) SpO2:  [92 %-100 %] 95 % (06/24 0700) Weight:  [106.3 kg (234 lb 5.6 oz)] 106.3 kg (234 lb 5.6 oz) (06/24 0500)  Hemodynamic parameters for last 24 hours:    Intake/Output from previous day: 06/23 0701 - 06/24 0700 In: 820 [P.O.:720; IV Piggyback:100] Out: 835 [Urine:835] Intake/Output this shift:    General appearance: alert and cooperative Neurologic: intact Heart: regular rate and rhythm, S1, S2 normal, no murmur, click, rub or gallop Lungs: clear to auscultation bilaterally Extremities: extremities normal, atraumatic, no cyanosis or edema Wound: left groin incision ok  Lab Results:  Recent Labs  05/27/15 0400 05/28/15 0315  WBC 11.3* 12.3*  HGB 10.3* 11.3*  HCT 29.7* 32.1*  PLT 231 202   BMET:  Recent Labs  05/28/15 0315 05/29/15 0230  NA 137 135  K 3.6 4.4  CL 103 100*  CO2 27 26  GLUCOSE 153* 109*  BUN 9 19  CREATININE 0.73 0.86  CALCIUM 9.9 10.2    PT/INR:  Recent Labs  05/26/15 1105  LABPROT 15.1  INR 1.17   ABG    Component Value Date/Time   PHART 7.378 05/26/2015 1107   HCO3 26.7* 05/26/2015 1107   TCO2 26 05/26/2015 1755   O2SAT 91.0 05/26/2015 1107   CBG (last 3)   Recent Labs  05/28/15 1919 05/28/15 2339 05/29/15 0324  GLUCAP 169* 115* 100*    Assessment/Plan: S/P Procedure(s) (LRB): TRANSCATHETER AORTIC VALVE REPLACEMENT, TRANSFEMORAL (N/A) TRANSESOPHAGEAL  ECHOCARDIOGRAM (TEE) (N/A)  She is doing well POD 3.  Dr. Angelena Form is adjusting her antihypertensives and insulin.  Postop echo ok  Continue mobilization, IS, flutter valve  Transfer to 2W.   LOS: 3 days    Gaye Pollack 05/29/2015

## 2015-05-29 NOTE — Care Management Note (Signed)
Case Management Note  Patient Details  Name: Katherine [name] MRN: [id number] Date of Birth: [birth year]  Subjective/Objective:      Pt lives with dtr, [name], who works @ [employer].  She states [name] makes the decisions for her and requests that CSW call [name] re rehab @ SNF.                           Expected Discharge Plan:  Skilled Nursing Facility  In-House Referral:  Clinical Social Work  Discharge planning Services     Post Acute Care Choice:    Choice offered to:     DME Arranged:    DME Agency:     HH Arranged:    HH Agency:     Status of Service:  In process, will continue to follow  Medicare Important Message Given:  Yes Date Medicare IM Given:  05/29/15 Medicare IM give by:  Babette Relic RN Date Additional Medicare IM Given:    Additional Medicare Important Message give by:     If discussed at [place] of Stay Meetings, dates discussed:    Additional Comments:  Girard Cooter, RN 05/29/2015, 10:37 AM

## 2015-05-29 NOTE — Clinical Social Work Note (Signed)
Clinical Social Work Assessment  Patient Details  Name: Katherine [name] MRN: [id number] Date of Birth: 11/04/1928  Date of referral:  05/29/15               Reason for consult:  Facility Placement                Permission sought to share information with:  Chartered certified accountant granted to share information::  Yes, Verbal Permission Granted  Name::     Tree surgeon::  [facility]  Relationship::  Radio broadcast assistant Information:     Housing/Transportation Living arrangements for the past 2 months:  Single Family Home Source of Information:  Patient Patient Interpreter Needed:  None Criminal Activity/Legal Involvement Pertinent to Current Situation/Hospitalization:  No - Comment as needed Significant Relationships:  Adult Children Lives with:  Adult Children Do you feel safe going back to the place where you live?  Yes Need for family participation in patient care:  Yes (Comment)  Care giving concerns:  Pt lives at home with daughter who works full time at [employer]- unable to care for pt during the day- they have someone who comes in 3 hours a day but dtr feels like that is not enough   Facilities manager / plan:  CSW spoke with pt daughter about SNF placement for short term rehab.  Employment status:  Retired Forensic scientist:  Medicare PT Recommendations:  [place] / Referral to community resources:  [facility]  Patient/Family's Response to care: Pt dtr is agreeable to SNF placement and is adamant that this is a short term plan- states that she will quit her job rather than have her mom placed long term- wants to make sure that pt is included in conversation and made aware that this is short term placement  Patient/Family's Understanding of and Emotional Response to Diagnosis, Current Treatment, and Prognosis:  Patient dtr is somewhat conflicted about placement because she knows the pt does not want to  go but truly believes this is the only safe DC plan for the pt right now  Emotional Assessment Appearance:    Attitude/Demeanor/Rapport:  Unable to Assess Affect (typically observed):  Unable to Assess Orientation:  Oriented to Self Alcohol / Substance use:  Not Applicable Psych involvement (Current and /or in the community):  No (Comment)  Discharge Needs  Concerns to be addressed:  Discharge Planning Concerns Readmission within the last 30 days:    Current discharge risk:  Physical Impairment Barriers to Discharge:  Continued Medical Work up   Cranford Mon, LCSW 05/29/2015, 4:49 PM

## 2015-05-30 ENCOUNTER — Inpatient Hospital Stay (HOSPITAL_COMMUNITY): Payer: Medicare Other

## 2015-05-30 DIAGNOSIS — I350 Nonrheumatic aortic (valve) stenosis: Secondary | ICD-10-CM | POA: Insufficient documentation

## 2015-05-30 DIAGNOSIS — R0602 Shortness of breath: Secondary | ICD-10-CM | POA: Insufficient documentation

## 2015-05-30 LAB — GLUCOSE, CAPILLARY
GLUCOSE-CAPILLARY: 177 mg/dL — AB (ref 65–99)
GLUCOSE-CAPILLARY: 323 mg/dL — AB (ref 65–99)
GLUCOSE-CAPILLARY: 215 mg/dL — AB (ref 65–99)
Glucose-Capillary: 137 mg/dL — ABNORMAL HIGH (ref 65–99)
Glucose-Capillary: 183 mg/dL — ABNORMAL HIGH (ref 65–99)

## 2015-05-30 LAB — BASIC METABOLIC PANEL
ANION GAP: 11 (ref 5–15)
BUN: 24 mg/dL — ABNORMAL HIGH (ref 6–20)
CALCIUM: 10.6 mg/dL — AB (ref 8.9–10.3)
CO2: 26 mmol/L (ref 22–32)
Chloride: 98 mmol/L — ABNORMAL LOW (ref 101–111)
Creatinine, Ser: 0.9 mg/dL (ref 0.44–1.00)
GFR calc Af Amer: >60 mL/min (ref >60)
GFR, EST NON AFRICAN AMERICAN: 57 mL/min — AB (ref >60)
GLUCOSE: 151 mg/dL — AB (ref 65–99)
Potassium: 4.5 mmol/L (ref 3.5–5.1)
Sodium: 135 mmol/L (ref 135–145)

## 2015-05-30 LAB — CBC
HEMATOCRIT: 34.2 % — AB (ref 36.0–46.0)
HEMOGLOBIN: 12 g/dL (ref 12.0–15.0)
MCH: 27.1 pg (ref 26.0–34.0)
MCHC: 35.1 g/dL (ref 30.0–36.0)
MCV: 77.4 fL — AB (ref 78.0–100.0)
Platelets: 244 10*3/uL (ref 150–400)
RBC: 4.42 MIL/uL (ref 3.87–5.11)
RDW: 14.4 % (ref 11.5–15.5)
WBC: 12.2 10*3/uL — AB (ref 4.0–10.5)

## 2015-05-30 MED ORDER — FUROSEMIDE 10 MG/ML IJ SOLN
80.0000 mg | Freq: Two times a day (BID) | INTRAMUSCULAR | Status: DC
  Administered 2015-05-30 – 2015-06-01 (×6): 80 mg via INTRAVENOUS
  Filled 2015-05-30 (×10): qty 8

## 2015-05-30 MED ORDER — MORPHINE SULFATE 2 MG/ML IJ SOLN
INTRAMUSCULAR | Status: AC
  Administered 2015-05-30: 1 mg via INTRAVENOUS
  Filled 2015-05-30: qty 1

## 2015-05-30 MED ORDER — FUROSEMIDE 10 MG/ML IJ SOLN
80.0000 mg | Freq: Once | INTRAMUSCULAR | Status: AC
  Administered 2015-05-30: 80 mg via INTRAVENOUS
  Filled 2015-05-30: qty 8

## 2015-05-30 MED ORDER — MORPHINE SULFATE 2 MG/ML IJ SOLN
1.0000 mg | Freq: Once | INTRAMUSCULAR | Status: AC
  Administered 2015-05-30: 1 mg via INTRAVENOUS

## 2015-05-30 MED ORDER — NITROGLYCERIN 2 % TD OINT
1.0000 [in_us] | TOPICAL_OINTMENT | Freq: Four times a day (QID) | TRANSDERMAL | Status: DC
  Administered 2015-05-30 – 2015-05-31 (×3): 1 [in_us] via TOPICAL
  Filled 2015-05-30: qty 30

## 2015-05-30 NOTE — Progress Notes (Signed)
Nurse came in to check patient and noted patient struggling to breath, voiced i can't breath . Vitals taken Bp systolic in the [id number]. O2 low 80's at 2l. Respiratory, Rapid response  and cardiologist on call paged.Cardiologist came and saw patient. Orders received. Bp at this time is WNL. 149/64, Oxygen  99% 2L. HR 113 Patient continues to say i am not okay and anxious.

## 2015-05-30 NOTE — Significant Event (Signed)
Paged re rapid response for acute onset of dyspnea and hypertension to 172/81.  Maintaining sats on 2L [place] but in moderate resp distress. She was given PRN hydralazine and metoprolol prior to my arrival.  On my exam she was elderly, mildly confused, in moderate resp distress.189/87.100% on 2L. RR in 24s. Lungs had coarse breath sounds at bases but no frank rales. CXR demonstrated bilateral pulm edema.   Impression Flash pulm edema  Plan 80mg  IV lasix 1 inch nitropast 1mg  IV morphine

## 2015-05-30 NOTE — Progress Notes (Signed)
Call received per floor RN at [phone] regarding Pt with severe SOB and wheezing. RT at bedside giving albuterol treatment. Upon my arrival at 0420 Pt found in bed, RR 30s, po2 98% on Albuterol NEB Mask, an 97-100 % on 2 LNC.  Lung sounds with crackles in upper lobes, upper airway course with slight expiratory wheeze. Pt awake, alert, confused, follows commands. SBP  225/109, HR 120s initally. Hydralazine 20mg , Lopressor 5mg  given. Stat PCXR ordered and completed. Cardiology MD on call Dr. Tommi Rumps paged to bedside. Pt BP with some improvement after meds, 167/88 at 0435.  MD to bedside at 0435 to assess Pt. Lasix,Morphine, NTG paste ordered. RN advised to monitor closely, and notify myself and provider for worsening changes.

## 2015-05-30 NOTE — Evaluation (Signed)
Clinical/Bedside Swallow Evaluation Patient Details  Name: Katherine [name] MRN: [id number] Date of Birth: 1928-09-20  Today's Date: 05/30/2015 Time: SLP Start Time (ACUTE ONLY): 1045 SLP Stop Time (ACUTE ONLY): 1110 SLP Time Calculation (min) (ACUTE ONLY): 25 min  Past Medical History:  Past Medical History  Diagnosis Date  . Gastric mass   . Hernia of unspecified site of abdominal cavity without mention of obstruction or gangrene     hiatal  . Gastric ulcer   . Thyroid mass   . GERD (gastroesophageal reflux disease)   . Anxiety   . Coronary artery disease   . Osteoarthritis   . Anemia   . Hypertension   . Hyperlipidemia   . Diabetes mellitus, type 2   . Aortic valvular stenosis     moderate to severe  EF on 2D Echo 10/22/2012 65-70%  valve area of 1.2cm, and peak and mean gradients of 45 and 80mmHg  . CHF (congestive heart failure)   . Dyspnea     chronic  . Easy fatigability   . Normal cardiac stress test 12/2013    normal stress nuclear study  . Heart murmur   . Chalazion of right upper eyelid early June 2016    treating with cream, Rx by Dr. Merlyn Albert     Past Surgical History:  Past Surgical History  Procedure Laterality Date  . Coronary artery bypass graft      x4 in 2003  . Tubal ligation    . Knee surgery Right   . Uterine polypectomy    . Internal hemorrhoidectomy    . Cataract extraction Bilateral   . Left and right heart catheterization with coronary/graft angiogram N/A 03/24/2015    Procedure: LEFT AND RIGHT HEART CATHETERIZATION WITH Beatrix Fetters;  Surgeon: Burnell Blanks, MD;  Location: [facility] CATH LAB;  Service: Cardiovascular;  Laterality: N/A;  . Transcatheter aortic valve replacement, transfemoral N/A 05/26/2015    Procedure: TRANSCATHETER AORTIC VALVE REPLACEMENT, TRANSFEMORAL;  Surgeon: Burnell Blanks, MD;  Location: [facility];  Service: Open Heart Surgery;  Laterality: N/A;  . Tee without cardioversion N/A 05/26/2015   Procedure: TRANSESOPHAGEAL ECHOCARDIOGRAM (TEE);  Surgeon: Burnell Blanks, MD;  Location: [facility];  Service: Open Heart Surgery;  Laterality: N/A;   HPI:  The patient, an 81 year old female, was admitted to the hospital due to severe aortic stenosis for a transcatheter aortic valve replacement.  Nursing reports changes in the patient's respiratory status and concerns for aspiration.  The patient was made NPO this AM.  Most current chest x-ray from 05/30/2015 showed vascular congestion, cardiolegaly and bibasilar opacities with possible mild PNA.     Assessment / Plan / Recommendation Clinical Impression  Clinical swallow evaluation was completed.  The patient presents with oral and pharyngeal dysphagia characterized by delayed, disorganized oral phase, delayed swallow trigger and decreased hyo-laryngeal excursion.  The patient presents with overt s/s of aspiration as seen with an inconsistent throat clear and increased work of breathing/wheeze.  Recommend keep the patient NPO and proceed with MBS to determine safest PO diet.         Diet Recommendation NPO   Medication Administration: Via alternative means    Other  Recommendations Oral Care Recommendations: Oral care BID         Pertinent Vitals/Pain The patient reports no pain.       Swallow Study Prior Functional Status       General Date of Onset: 05/26/15 Other Pertinent Information: The patient,  an 81 year old female, was admitted to the hospital due to severe aortic stenosis for a transcatheter aortic valve replacement.  Nursing reports changes in the patient's respiratory status and concerns for aspiration.  The patient was made NPO this AM.  Most current chest x-ray from 05/30/2015 showed vascular congestion, cardiolegaly and bibasilar opacities with possible mild PNA.   Type of Study: Bedside swallow evaluation Previous Swallow Assessment: None noted. Diet Prior to this Study: NPO Temperature Spikes Noted: No History of  Recent Intubation: Yes Length of Intubations (days): 1 days (for surgery only) Date extubated: 05/26/15 Behavior/Cognition: Alert;Cooperative;Pleasant mood;Requires cueing Oral Cavity - Dentition:  (Upper and lower dentures that fit well. ) Self-Feeding Abilities: Able to feed self with adaptive devices Patient Positioning: Upright in bed Baseline Vocal Quality: Normal Volitional Cough: Weak Volitional Swallow: Able to elicit    Oral/Motor/Sensory Function Overall Oral Motor/Sensory Function: Appears within functional limits for tasks assessed Labial ROM: Within Functional Limits Labial Symmetry: Within Functional Limits Labial Strength: Within Functional Limits Labial Sensation: Reduced Lingual ROM: Other (Comment) (Pt with difficulty elevating.  ) Lingual Symmetry: Within Functional Limits Lingual Strength: Reduced Facial ROM: Within Functional Limits Facial Symmetry: Within Functional Limits Facial Strength: Within Functional Limits Mandible: Within Functional Limits   Ice Chips Ice chips: Not tested   Thin Liquid Thin Liquid: Impaired Presentation: Cup;Spoon;Self Fed Oral Phase Impairments: Reduced labial seal Oral Phase Functional Implications: Prolonged oral transit Pharyngeal  Phase Impairments: Suspected delayed Swallow;Decreased hyoid-laryngeal movement;Throat Clearing - Delayed;Cough - Delayed    Nectar Thick Nectar Thick Liquid: Not tested   Honey Thick Honey Thick Liquid: Not tested   Puree Puree: Impaired Presentation: Spoon Oral Phase Impairments: Reduced labial seal;Impaired anterior to posterior transit Oral Phase Functional Implications: Prolonged oral transit Pharyngeal Phase Impairments: Suspected delayed Swallow;Decreased hyoid-laryngeal movement   Solid   GO    Solid: Impaired Presentation: Spoon;Self Fed Oral Phase Impairments: Reduced labial seal;Reduced lingual movement/coordination;Impaired anterior to posterior transit;Poor awareness of  bolus;Impaired mastication Oral Phase Functional Implications: Oral residue (delayed oral transit) Pharyngeal Phase Impairments: Suspected delayed Swallow;Decreased hyoid-laryngeal movement;Throat Clearing - Delayed       Lamar Sprinkles 05/30/2015,11:30 AM  Shelly Flatten, [place], [facility] SLP [phone]

## 2015-05-30 NOTE — Plan of Care (Signed)
Problem: Phase II Progression Outcomes Goal: Barriers To Progression Addressed/Resolved Outcome: Not Progressing Patient may have aspirated.  Constant cough with little cough effort.  Lungs sound diminished.  Provider notified.  Patient diet changed to NPO and swallow evaluation ordered.

## 2015-05-30 NOTE — Progress Notes (Signed)
MBS report now available under imaging section  Arvil Chaco MA, [facility] Speech Language Pathologist

## 2015-05-30 NOTE — Progress Notes (Signed)
SUBJECTIVE:  Became acutely hypertensive overnight at 172/60mmHg and desated with moderate respiratory distress.  Given Hydralazine and metoprolol.  O2 sats 100% on 2L at time of event.  CXR showed pulmonary edema.    OBJECTIVE:   Vitals:   Filed Vitals:   05/30/15 0525 05/30/15 0528 05/30/15 0543 05/30/15 0615  BP: 147/55  138/57 152/60  Pulse:   112 112  Temp:      TempSrc:      Resp:    21  Height:      Weight:  244 lb 0.8 oz (110.7 kg)    SpO2:   99% 100%   I&O's:   Intake/Output Summary (Last 24 hours) at 05/30/15 0947 Last data filed at 05/30/15 0617  Gross per 24 hour  Intake      0 ml  Output   1650 ml  Net  -1650 ml   TELEMETRY: Reviewed telemetry pt in NSR:     PHYSICAL EXAM General: Well developed, well nourished, in no acute distress Head: Eyes PERRLA, No xanthomas.   Normal cephalic and atramatic  Lungs:   Crackles at left base Heart:   HRRR S1 S2 Pulses are 2+ & equal. Abdomen: Bowel sounds are positive, abdomen soft and non-tender without masses  Extremities:   No clubbing, cyanosis or edema.  DP +1 Neuro: Alert and oriented X 3. Psych:  Good affect, responds appropriately   LABS: Basic Metabolic Panel:  Recent Labs  05/27/15 1349  05/28/15 0315 05/29/15 0230 05/30/15 0428  NA  --   < > 137 135 135  K  --   < > 3.6 4.4 4.5  CL  --   < > 103 100* 98*  CO2  --   < > 27 26 26   GLUCOSE  --   < > 153* 109* 151*  BUN  --   < > 9 19 24*  CREATININE  --   < > 0.73 0.86 0.90  CALCIUM  --   < > 9.9 10.2 10.6*  MG 1.7  --  1.7  --   --   < > = values in this interval not displayed. Liver Function Tests: No results for input(s): AST, ALT, ALKPHOS, BILITOT, PROT, ALBUMIN in the last 72 hours. No results for input(s): LIPASE, AMYLASE in the last 72 hours. CBC:  Recent Labs  05/28/15 0315 05/30/15 0428  WBC 12.3* 12.2*  HGB 11.3* 12.0  HCT 32.1* 34.2*  MCV 77.5* 77.4*  PLT 202 244   Cardiac Enzymes: No results for input(s): CKTOTAL, CKMB,  CKMBINDEX, TROPONINI in the last 72 hours. BNP: Invalid input(s): POCBNP D-Dimer: No results for input(s): DDIMER in the last 72 hours. Hemoglobin A1C: No results for input(s): HGBA1C in the last 72 hours. Fasting Lipid Panel: No results for input(s): CHOL, HDL, LDLCALC, TRIG, CHOLHDL, LDLDIRECT in the last 72 hours. Thyroid Function Tests: No results for input(s): TSH, T4TOTAL, T3FREE, THYROIDAB in the last 72 hours.  Invalid input(s): FREET3 Anemia Panel: No results for input(s): VITAMINB12, FOLATE, FERRITIN, TIBC, IRON, RETICCTPCT in the last 72 hours. Coag Panel:   Lab Results  Component Value Date   INR 1.17 05/26/2015   INR 1.01 05/22/2015   INR 1.0 03/17/2015    RADIOLOGY: Dg Chest 2 View  05/22/2015   CLINICAL DATA:  Transcatheter aortic valve replacement, aortic stenosis  EXAM: CHEST  2 VIEW  COMPARISON:  01/06/2015  FINDINGS: Cardiomegaly is noted. Again noted status post CABG. No acute infiltrate or pulmonary  edema. Mild right basilar atelectasis or scarring. Atherosclerotic calcifications of thoracic aorta. Thoracic spine osteopenia.  IMPRESSION: No acute infiltrate or pulmonary edema. Status post CABG. Mild right basilar atelectasis or scarring.   Electronically Signed   By: Lahoma Crocker M.D.   On: 05/22/2015 15:36   Dg Chest Port 1 View  05/30/2015   CLINICAL DATA:  Acute onset of shortness of breath. Initial encounter.  EXAM: PORTABLE CHEST - 1 VIEW  COMPARISON:  Chest radiograph performed 05/27/2015  FINDINGS: The lungs are hypoexpanded. Vascular congestion is noted. Mild bibasilar opacities may reflect atelectasis or mild pneumonia. No pleural effusion or pneumothorax is seen.  The cardiomediastinal silhouette is enlarged. The patient is status post median sternotomy, with evidence of prior CABG. No acute osseous abnormalities are seen.  IMPRESSION: Lungs hypoexpanded. Vascular congestion and cardiomegaly. Mild bibasilar opacities may reflect atelectasis or mild pneumonia.    Electronically Signed   By: Garald Balding M.D.   On: 05/30/2015 05:00   Dg Chest Port 1 View  05/27/2015   CLINICAL DATA:  Post transcatheter aortic valve replacement, severe aortic stenosis  EXAM: PORTABLE CHEST - 1 VIEW  COMPARISON:  Portable chest x-ray of 05/26/2015  FINDINGS: There is little change in overall aeration with basilar atelectasis right greater than left. Cardiomegaly and mild pulmonary vascular congestion remain. The tip of the Swan-Ganz catheter remains in the pulmonary artery to the right lower lobe. No pneumothorax is seen.  IMPRESSION: 1. Little change in poor aeration with basilar atelectasis right greater than left. 2. Cardiomegaly.  Question mild pulmonary vascular congestion.   Electronically Signed   By: Ivar Drape M.D.   On: 05/27/2015 08:04   Dg Chest Port 1 View  05/26/2015   CLINICAL DATA:  Transcatheter aortic valve replacement. Aortic valve stenosis  EXAM: PORTABLE CHEST - 1 VIEW  COMPARISON:  May 22, 2015  FINDINGS: There is now a prosthetic aortic valve present. Swan-Ganz catheter tip is in the distal right main pulmonary artery. No pneumothorax. There is no edema or consolidation. Heart is mildly enlarged with pulmonary vascular within normal limits. Patient is also status post coronary artery bypass grafting. No adenopathy.  IMPRESSION: Swan-Ganz catheter tip and distal right main pulmonary artery. No pneumothorax. Stable cardiomegaly. No edema or consolidation. Prosthetic aortic valve present.   Electronically Signed   By: Lowella Grip III M.D.   On: 05/26/2015 11:40   ASSESSMENT AND PLAN:  1. Severe aortic valve stenosis: POD #4 s/p TAVR. Continue Plavix.Will use Plavix alone without ASA given advanced age. Echo with normally functioning valve 05/27/15.   2. HTN: had markedly elevated BP last night associated with flash pulmonary edema. BP improved this am.   Continue current medications.   3. DM: Blood sugars well controlled. Advance to home insulin  dosages as po intake increases.   4.  Acute pulmonary edema ? Secondary to acute hypertensive event.  O2 sats at 100% on 2L O2. She put out 1500cc overnight.  She is +888 L.  Continue IV lasix BID.  Chest xray early this am with ? Atelectasis vs. Mild PNA.  Recheck chest xray - WBC mildy elevated ? Aspiration PNA   5.  Dispo: Will ask case management to see. Will likely need SNF placement.        Sueanne Margarita, MD  05/30/2015  9:47 AM

## 2015-05-30 NOTE — Progress Notes (Addendum)
      [address]       ,[place] [address]             [phone]      4 Days Post-Op Procedure(s) (LRB): TRANSCATHETER AORTIC VALVE REPLACEMENT, TRANSFEMORAL (N/A) TRANSESOPHAGEAL ECHOCARDIOGRAM (TEE) (N/A)   Subjective:  Katherine [name] states she had a rough day and night.  She is confused this morning and states she wishes she knew where she is.  I tried to reorient her, but she states that's where we tell her she is.  She does have some shortness of breath this morning and states she has a cough, but is unable to cough anything up.     Objective: Vital signs in last 24 hours: Temp:  [97.9 F (36.6 C)-98.8 F (37.1 C)] 98.8 F (37.1 C) (06/25 0400) Pulse Rate:  [77-119] 112 (06/25 0615) Cardiac Rhythm:  [-] Normal sinus rhythm (06/24 2100) Resp:  [21-26] 21 (06/25 0615) BP: (134-235)/(49-138) 152/60 mmHg (06/25 0615) SpO2:  [95 %-100 %] 100 % (06/25 0615) Weight:  [244 lb 0.8 oz (110.7 kg)] 244 lb 0.8 oz (110.7 kg) (06/25 0528)  Intake/Output from previous day: 06/24 0701 - 06/25 0700 In: 240 [P.O.:240] Out: 1740 [Urine:1740]  General appearance: alert, cooperative and no distress Heart: regular rate and rhythm Lungs: coarse Abdomen: soft, non-tender; bowel sounds normal; no masses,  no organomegaly Extremities: edema 1+ Wound: clean and dry  Lab Results:  Recent Labs  05/28/15 0315 05/30/15 0428  WBC 12.3* 12.2*  HGB 11.3* 12.0  HCT 32.1* 34.2*  PLT 202 244   BMET:  Recent Labs  05/29/15 0230 05/30/15 0428  NA 135 135  K 4.4 4.5  CL 100* 98*  CO2 26 26  GLUCOSE 109* 151*  BUN 19 24*  CREATININE 0.86 0.90  CALCIUM 10.2 10.6*    PT/INR: No results for input(s): LABPROT, INR in the last 72 hours. ABG    Component Value Date/Time   PHART 7.378 05/26/2015 1107   HCO3 26.7* 05/26/2015 1107   TCO2 26 05/26/2015 1755   O2SAT 91.0 05/26/2015 1107   CBG (last 3)   Recent Labs  05/29/15 2054 05/30/15 0400 05/30/15 0614    GLUCAP 234* 137* 177*    Assessment/Plan: S/P Procedure(s) (LRB): TRANSCATHETER AORTIC VALVE REPLACEMENT, TRANSFEMORAL (N/A) TRANSESOPHAGEAL ECHOCARDIOGRAM (TEE) (N/A)  1. CV- Hypertensive overnight- on Bystolic, NTG patch, Lisinopril, and Hydralazine 2. Pulm- dyspneic overnight, desaturations on oxygen, difficult for patient to use IS... There is also concern of aspiration, CXR with atelectasis ? Mild pneumonia 3. Renal- creatinine WNL, weight is up about 10 lbs, continue Lasix 4. Dementia- on home medications 5. Dispo- patient with significant event overnight, HTN medications adjusted, diuretics increased, concern for aspiration.... Made NPO care per Cardiology   LOS: 4 days    Ellwood Handler 05/30/2015  Patient seen and examined, agree with above  Remo Lipps C. Roxan Hockey, MD [facility] [phone]

## 2015-05-31 ENCOUNTER — Inpatient Hospital Stay (HOSPITAL_COMMUNITY): Payer: Medicare Other

## 2015-05-31 LAB — CBC WITH DIFFERENTIAL/PLATELET
BASOS ABS: 0 10*3/uL (ref 0.0–0.1)
Basophils Relative: 0 % (ref 0–1)
EOS PCT: 1 % (ref 0–5)
Eosinophils Absolute: 0.1 10*3/uL (ref 0.0–0.7)
HEMATOCRIT: 31.6 % — AB (ref 36.0–46.0)
Hemoglobin: 10.9 g/dL — ABNORMAL LOW (ref 12.0–15.0)
Lymphocytes Relative: 19 % (ref 12–46)
Lymphs Abs: 1.8 10*3/uL (ref 0.7–4.0)
MCH: 27 pg (ref 26.0–34.0)
MCHC: 34.5 g/dL (ref 30.0–36.0)
MCV: 78.4 fL (ref 78.0–100.0)
MONO ABS: 1 10*3/uL (ref 0.1–1.0)
MONOS PCT: 11 % (ref 3–12)
NEUTROS ABS: 6.5 10*3/uL (ref 1.7–7.7)
Neutrophils Relative %: 69 % (ref 43–77)
Platelets: 252 10*3/uL (ref 150–400)
RBC: 4.03 MIL/uL (ref 3.87–5.11)
RDW: 14.3 % (ref 11.5–15.5)
WBC: 9.4 10*3/uL (ref 4.0–10.5)

## 2015-05-31 LAB — GLUCOSE, CAPILLARY
Glucose-Capillary: 131 mg/dL — ABNORMAL HIGH (ref 65–99)
Glucose-Capillary: 137 mg/dL — ABNORMAL HIGH (ref 65–99)
Glucose-Capillary: 159 mg/dL — ABNORMAL HIGH (ref 65–99)
Glucose-Capillary: 166 mg/dL — ABNORMAL HIGH (ref 65–99)
Glucose-Capillary: 368 mg/dL — ABNORMAL HIGH (ref 65–99)
Glucose-Capillary: 59 mg/dL — ABNORMAL LOW (ref 65–99)
Glucose-Capillary: 76 mg/dL (ref 65–99)

## 2015-05-31 MED ORDER — LISINOPRIL 20 MG PO TABS
20.0000 mg | ORAL_TABLET | Freq: Every day | ORAL | Status: DC
  Filled 2015-05-31: qty 1

## 2015-05-31 MED ORDER — LISINOPRIL 10 MG PO TABS
10.0000 mg | ORAL_TABLET | Freq: Every day | ORAL | Status: DC
  Administered 2015-05-31 – 2015-06-01 (×2): 10 mg via ORAL
  Filled 2015-05-31 (×4): qty 1

## 2015-05-31 MED ORDER — FUROSEMIDE 10 MG/ML IJ SOLN: 40.0000 mg | Freq: Once | INTRAMUSCULAR | Status: DC

## 2015-05-31 MED ORDER — FUROSEMIDE 10 MG/ML IJ SOLN
40.0000 mg | INTRAMUSCULAR | Status: AC | PRN
  Administered 2015-06-03: 40 mg via INTRAVENOUS

## 2015-05-31 NOTE — Progress Notes (Addendum)
      [address]       [place] [address]             [phone]      5 Days Post-Op Procedure(s) (LRB): TRANSCATHETER AORTIC VALVE REPLACEMENT, TRANSFEMORAL (N/A) TRANSESOPHAGEAL ECHOCARDIOGRAM (TEE) (N/A)   Subjective:  Ms. Carnley states she feels better this morning.  She did experience another episode of HTN and dyspnea overnight.  IV lasix was administered in addition to antihypertensives which seemed to help.  Objective: Vital signs in last 24 hours: Temp:  [98.7 F (37.1 C)-98.8 F (37.1 C)] 98.7 F (37.1 C) (06/26 0329) Pulse Rate:  [72-98] 78 (06/26 0445) Cardiac Rhythm:  [-] Normal sinus rhythm (06/25 2100) Resp:  [20-24] 22 (06/26 0342) BP: (119-209)/(44-109) 120/51 mmHg (06/26 0506) SpO2:  [95 %-100 %] 100 % (06/26 0445) Weight:  [234 lb 5.6 oz (106.3 kg)] 234 lb 5.6 oz (106.3 kg) (06/26 0349)  Intake/Output from previous day: 06/25 0701 - 06/26 0700 In: -  Out: 2625 [Urine:2625]  General appearance: alert, cooperative and no distress Heart: regular rate and rhythm Lungs: wheezes scattered Abdomen: soft, non-tender; bowel sounds normal; no masses,  no organomegaly Wound: clean and dry  Lab Results:  Recent Labs  05/30/15 0428  WBC 12.2*  HGB 12.0  HCT 34.2*  PLT 244   BMET:  Recent Labs  05/29/15 0230 05/30/15 0428  NA 135 135  K 4.4 4.5  CL 100* 98*  CO2 26 26  GLUCOSE 109* 151*  BUN 19 24*  CREATININE 0.86 0.90  CALCIUM 10.2 10.6*    PT/INR: No results for input(s): LABPROT, INR in the last 72 hours. ABG    Component Value Date/Time   PHART 7.378 05/26/2015 1107   HCO3 26.7* 05/26/2015 1107   TCO2 26 05/26/2015 1755   O2SAT 91.0 05/26/2015 1107   CBG (last 3)   Recent Labs  05/30/15 2048 05/31/15 0036 05/31/15 0405  GLUCAP 215* 131* 137*    Assessment/Plan: S/P Procedure(s) (LRB): TRANSCATHETER AORTIC VALVE REPLACEMENT, TRANSFEMORAL (N/A) TRANSESOPHAGEAL ECHOCARDIOGRAM (TEE) (N/A)  1. CV-  continued episodes of HTN, continue current regimen 2. Pulm- continued dyspnea, on oxygen, encourage IS, continue Lasix 3. Dementia- on home meds 4. Dysphagia, MBS WNL 5. Dispo- care per cardiology   LOS: 5 days    Ellwood Handler 05/31/2015  Patient seen and examined, agree with above Dr. Radford Pax has ordered another CXR on Mrs. [place] today  Revonda Standard. Roxan Hockey, MD [facility] [phone]

## 2015-05-31 NOTE — Progress Notes (Signed)
0120-Pt stated "she cant catch her breath". Sat 90% on room air. Placed on 2L O2 sat 100%. Prn duoneb administered. Pt still stating she is short of breath and increasingly diaphoretic. pt BP 209/109, HR 95, resp 24 sat 100% on 4L. Given 5 mg Lopressor. Bp decreased. Pt verbalized easier to breathe. Recording BP regularly to monitor BP closely.  0320- Pt short of breath and diaphoretic again. BP 202/88, HR 95, sat 99 on 4L 20 mg hydralazine administered. BP decreased. Pt verbalized relief.  0415-MD paged regarding incidents. MD gave orders to administer 40 mg lasix on next incidence of shortness of breath. Will continue to monitor.   Raliegh Ip RN

## 2015-05-31 NOTE — Progress Notes (Signed)
SUBJECTIVE:  Still SOB but feels better  OBJECTIVE:   Vitals:   Filed Vitals:   05/31/15 0349 05/31/15 0422 05/31/15 0445 05/31/15 0506  BP: 134/55 119/44 119/52 120/51  Pulse: 83  78   Temp:      TempSrc:      Resp:      Height:      Weight: 234 lb 5.6 oz (106.3 kg)     SpO2: 100% 100% 100%    I&O's:   Intake/Output Summary (Last 24 hours) at 05/31/15 0911 Last data filed at 05/31/15 0058  Gross per 24 hour  Intake      0 ml  Output   2625 ml  Net  -2625 ml   TELEMETRY: Reviewed telemetry pt in NSR:     PHYSICAL EXAM General: Well developed, well nourished, in no acute distress Head: Eyes PERRLA, No xanthomas.   Normal cephalic and atramatic  Lungs:  Crackles at left base with coarse rhonchi Heart:   HRRR S1 S2 Pulses are 2+ & equal. Abdomen: Bowel sounds are positive, abdomen soft and non-tender without masses  Extremities:   No clubbing, cyanosis or edema.  DP +1 Neuro: Alert and oriented X 3. Psych:  Good affect, responds appropriately   LABS: Basic Metabolic Panel:  Recent Labs  05/29/15 0230 05/30/15 0428  NA 135 135  K 4.4 4.5  CL 100* 98*  CO2 26 26  GLUCOSE 109* 151*  BUN 19 24*  CREATININE 0.86 0.90  CALCIUM 10.2 10.6*   Liver Function Tests: No results for input(s): AST, ALT, ALKPHOS, BILITOT, PROT, ALBUMIN in the last 72 hours. No results for input(s): LIPASE, AMYLASE in the last 72 hours. CBC:  Recent Labs  05/30/15 0428  WBC 12.2*  HGB 12.0  HCT 34.2*  MCV 77.4*  PLT 244   Cardiac Enzymes: No results for input(s): CKTOTAL, CKMB, CKMBINDEX, TROPONINI in the last 72 hours. BNP: Invalid input(s): POCBNP D-Dimer: No results for input(s): DDIMER in the last 72 hours. Hemoglobin A1C: No results for input(s): HGBA1C in the last 72 hours. Fasting Lipid Panel: No results for input(s): CHOL, HDL, LDLCALC, TRIG, CHOLHDL, LDLDIRECT in the last 72 hours. Thyroid Function Tests: No results for input(s): TSH, T4TOTAL, T3FREE,  THYROIDAB in the last 72 hours.  Invalid input(s): FREET3 Anemia Panel: No results for input(s): VITAMINB12, FOLATE, FERRITIN, TIBC, IRON, RETICCTPCT in the last 72 hours. Coag Panel:   Lab Results  Component Value Date   INR 1.17 05/26/2015   INR 1.01 05/22/2015   INR 1.0 03/17/2015    RADIOLOGY: Dg Chest 1 View  05/30/2015   CLINICAL DATA:  Shortness of breath. Chest x-ray following swallowing study. History of CAD, hypertension, CHF, diabetes.  EXAM: CHEST  1 VIEW  COMPARISON:  05/30/2015  FINDINGS: Status post median sternotomy and CABG. The heart is enlarged. Shallow lung inflation. No pulmonary edema. There is patchy density at the medial right lung base, similar in appearance to the previous exam. Minimal streaky densities at the left lung base also appear stable.  IMPRESSION: 1. Cardiomegaly. 2. Bibasilar opacities, stable in appearance.   Electronically Signed   By: Nolon Nations M.D.   On: 05/30/2015 14:02   Dg Chest 2 View  05/22/2015   CLINICAL DATA:  Transcatheter aortic valve replacement, aortic stenosis  EXAM: CHEST  2 VIEW  COMPARISON:  01/06/2015  FINDINGS: Cardiomegaly is noted. Again noted status post CABG. No acute infiltrate or pulmonary edema. Mild right basilar atelectasis or scarring.  Atherosclerotic calcifications of thoracic aorta. Thoracic spine osteopenia.  IMPRESSION: No acute infiltrate or pulmonary edema. Status post CABG. Mild right basilar atelectasis or scarring.   Electronically Signed   By: Lahoma Crocker M.D.   On: 05/22/2015 15:36   Dg Chest Port 1 View  05/30/2015   CLINICAL DATA:  Acute onset of shortness of breath. Initial encounter.  EXAM: PORTABLE CHEST - 1 VIEW  COMPARISON:  Chest radiograph performed 05/27/2015  FINDINGS: The lungs are hypoexpanded. Vascular congestion is noted. Mild bibasilar opacities may reflect atelectasis or mild pneumonia. No pleural effusion or pneumothorax is seen.  The cardiomediastinal silhouette is enlarged. The patient is  status post median sternotomy, with evidence of prior CABG. No acute osseous abnormalities are seen.  IMPRESSION: Lungs hypoexpanded. Vascular congestion and cardiomegaly. Mild bibasilar opacities may reflect atelectasis or mild pneumonia.   Electronically Signed   By: Garald Balding M.D.   On: 05/30/2015 05:00   Dg Chest Port 1 View  05/27/2015   CLINICAL DATA:  Post transcatheter aortic valve replacement, severe aortic stenosis  EXAM: PORTABLE CHEST - 1 VIEW  COMPARISON:  Portable chest x-ray of 05/26/2015  FINDINGS: There is little change in overall aeration with basilar atelectasis right greater than left. Cardiomegaly and mild pulmonary vascular congestion remain. The tip of the Swan-Ganz catheter remains in the pulmonary artery to the right lower lobe. No pneumothorax is seen.  IMPRESSION: 1. Little change in poor aeration with basilar atelectasis right greater than left. 2. Cardiomegaly.  Question mild pulmonary vascular congestion.   Electronically Signed   By: Ivar Drape M.D.   On: 05/27/2015 08:04   Dg Chest Port 1 View  05/26/2015   CLINICAL DATA:  Transcatheter aortic valve replacement. Aortic valve stenosis  EXAM: PORTABLE CHEST - 1 VIEW  COMPARISON:  May 22, 2015  FINDINGS: There is now a prosthetic aortic valve present. Swan-Ganz catheter tip is in the distal right main pulmonary artery. No pneumothorax. There is no edema or consolidation. Heart is mildly enlarged with pulmonary vascular within normal limits. Patient is also status post coronary artery bypass grafting. No adenopathy.  IMPRESSION: Swan-Ganz catheter tip and distal right main pulmonary artery. No pneumothorax. Stable cardiomegaly. No edema or consolidation. Prosthetic aortic valve present.   Electronically Signed   By: Lowella Grip III M.D.   On: 05/26/2015 11:40   Dg Swallowing Func-speech Pathology  05/30/2015    Objective Swallowing Evaluation:    Patient Details  Name: Katherine [name] MRN: [id number] Date of  Birth: [birth year]  Today's Date: 05/30/2015 Time: SLP Start Time (ACUTE ONLY): 1045-SLP Stop Time (ACUTE ONLY): 1110 SLP Time Calculation (min) (ACUTE ONLY): 25 min  Past Medical History:  Past Medical History  Diagnosis Date  . Gastric mass   . Hernia of unspecified site of abdominal cavity without mention of  obstruction or gangrene     hiatal  . Gastric ulcer   . Thyroid mass   . GERD (gastroesophageal reflux disease)   . Anxiety   . Coronary artery disease   . Osteoarthritis   . Anemia   . Hypertension   . Hyperlipidemia   . Diabetes mellitus, type 2   . Aortic valvular stenosis     moderate to severe  EF on 2D Echo 10/22/2012 65-70%  valve area of  1.2cm, and peak and mean gradients of 45 and 52mmHg  . CHF (congestive heart failure)   . Dyspnea     chronic  . Easy fatigability   .  Normal cardiac stress test 12/2013    normal stress nuclear study  . Heart murmur   . Chalazion of right upper eyelid early June 2016    treating with cream, Rx by Dr. Merlyn Albert     Past Surgical History:  Past Surgical History  Procedure Laterality Date  . Coronary artery bypass graft      x4 in 2003  . Tubal ligation    . Knee surgery Right   . Uterine polypectomy    . Internal hemorrhoidectomy    . Cataract extraction Bilateral   . Left and right heart catheterization with coronary/graft angiogram N/A  03/24/2015    Procedure: LEFT AND RIGHT HEART CATHETERIZATION WITH Beatrix Fetters;  Surgeon: Burnell Blanks, MD;  Location: [facility] CATH LAB;   Service: Cardiovascular;  Laterality: N/A;  . Transcatheter aortic valve replacement, transfemoral N/A 05/26/2015    Procedure: TRANSCATHETER AORTIC VALVE REPLACEMENT, TRANSFEMORAL;   Surgeon: Burnell Blanks, MD;  Location: [place];  Service: Open  Heart Surgery;  Laterality: N/A;  . Tee without cardioversion N/A 05/26/2015    Procedure: TRANSESOPHAGEAL ECHOCARDIOGRAM (TEE);  Surgeon: Burnell Blanks, MD;  Location: [facility];  Service: Open Heart Surgery;  Laterality:  N/A;    HPI:  Other Pertinent Information: The patient, an 81 year old female, was  admitted to the hospital due to severe aortic stenosis for a transcatheter  aortic valve replacement.  Nursing reports changes in the patient's  respiratory status and concerns for aspiration.  The patient was made NPO  this AM.  Most current chest x-ray from 05/30/2015 showed vascular  congestion, cardiolegaly and bibasilar opacities with possible mild PNA.     No Data Recorded  Clinical Impressions: Pt presenting with mild oral and mild to moderate  pharyngeal dysphagia. Pt with delay in swallow initiation with thin  liquids at the level of the pyriform sinuses. Delay in swallow initiation  triggered at the valleculae with all other consistencies including puree,  nectar thick liquids, and regular consistencies. Silent penetration of  thin liquids evidenced when the patient used a straw. This did not pass  below the level of the true vocal cords, however volitional cough did not  clear the penetrates. No penetration or aspiration of thin liquids were  seen when the patient used a cup or teaspoon. Pt with mild pyriform sinus  residue secondary to reduced laryngeal elevation. Recommend regular diet  with thin liquids. NO straw, medicines whole with puree. ST to follow up  for diet tolerance and aspiration precaution education.  Assessment / Plan / Recommendation No flowsheet data found.    No flowsheet data found.   CHL IP DIET RECOMMENDATION 05/30/2015  SLP Diet Recommendations Age appropriate regular solids;Thin  Liquid Administration via Cup, spoon, NO Straw  Medication Administration Whole with puree  Compensations   Postural Changes and/or Swallow Maneuvers (None)     CHL IP OTHER RECOMMENDATIONS 05/30/2015  Recommended Consults (None)  Oral Care Recommendations Oral care BID  Other Recommendations (None)     No flowsheet data found.   No flowsheet data found.   Pertinent Vitals/Pain     SLP Swallow Goals No flowsheet data found.  No  flowsheet data found.    No flowsheet data found.          No flowsheet data found.  No flowsheet data found.  Arvil Chaco MA, [facility] Speech Language Pathologist          [place], [place]  E 05/30/2015, 2:10 PM    ASSESSMENT AND PLAN:  1. Severe aortic valve stenosis: POD #5 s/p TAVR. Continue Plavix.Will use Plavix alone without ASA given advanced age. Echo with normally functioning valve 05/27/15.   2. HTN: had markedly elevated BP last night at 202/48mmHg but BP improved this am and actually soft.Since her BP seems to be higher at night,I am going to change the timing of her lisinopril to try to avoid nighttime elevations in her BP.  Will change Lisinopril to 10mg  q 1800 and see if BP better at night.    3. DM: Blood sugars well controlled. Advance to home insulin dosages as po intake increases.   4. Acute pulmonary edema ? Secondary to acute hypertensive event.O2 sats at 100% on 2L O2. She is net neg 1.7 L. Weight has dropped 10lbs.  Continue IV lasix BID. Chest xray early this am with ? Atelectasis vs. Mild PNA.Her lungs sound junky today. WBC mildy elevated ? Aspiration PNA.  She is afebrile and WBC trending down.  Will repeat chest xray this am.   5. Dispo: Will ask case management to see. Will likely need SNF placement    Sueanne Margarita, MD  05/31/2015  9:11 AM

## 2015-06-01 DIAGNOSIS — I5031 Acute diastolic (congestive) heart failure: Secondary | ICD-10-CM

## 2015-06-01 LAB — GLUCOSE, CAPILLARY
GLUCOSE-CAPILLARY: 133 mg/dL — AB (ref 65–99)
GLUCOSE-CAPILLARY: 172 mg/dL — AB (ref 65–99)
Glucose-Capillary: 120 mg/dL — ABNORMAL HIGH (ref 65–99)
Glucose-Capillary: 105 mg/dL — ABNORMAL HIGH (ref 65–99)
Glucose-Capillary: 181 mg/dL — ABNORMAL HIGH (ref 65–99)
Glucose-Capillary: 153 mg/dL — ABNORMAL HIGH (ref 65–99)
Glucose-Capillary: 136 mg/dL — ABNORMAL HIGH (ref 65–99)

## 2015-06-01 LAB — BASIC METABOLIC PANEL
Anion gap: 9 (ref 5–15)
BUN: 30 mg/dL — ABNORMAL HIGH (ref 6–20)
CALCIUM: 10.2 mg/dL (ref 8.9–10.3)
CO2: 32 mmol/L (ref 22–32)
Chloride: 98 mmol/L — ABNORMAL LOW (ref 101–111)
Creatinine, Ser: 0.95 mg/dL (ref 0.44–1.00)
GFR calc Af Amer: >60 mL/min (ref >60)
GFR, EST NON AFRICAN AMERICAN: 53 mL/min — AB (ref >60)
GLUCOSE: 201 mg/dL — AB (ref 65–99)
Potassium: 3.7 mmol/L (ref 3.5–5.1)
Sodium: 139 mmol/L (ref 135–145)

## 2015-06-01 MED ORDER — DOCUSATE SODIUM 100 MG PO CAPS
100.0000 mg | ORAL_CAPSULE | Freq: Two times a day (BID) | ORAL | Status: DC
  Administered 2015-06-01 – 2015-06-03 (×5): 100 mg via ORAL
  Filled 2015-06-01 (×7): qty 1

## 2015-06-01 MED ORDER — POLYETHYLENE GLYCOL 3350 17 G PO PACK
17.0000 g | PACK | Freq: Every day | ORAL | Status: DC
  Administered 2015-06-01 – 2015-06-03 (×2): 17 g via ORAL
  Filled 2015-06-01 (×3): qty 1

## 2015-06-01 MED ORDER — METFORMIN HCL ER 500 MG PO TB24
500.0000 mg | ORAL_TABLET | Freq: Every day | ORAL | Status: DC
  Administered 2015-06-02: 500 mg via ORAL
  Filled 2015-06-01 (×3): qty 1

## 2015-06-01 MED ORDER — INSULIN DETEMIR 100 UNIT/ML ~~LOC~~ SOLN
48.0000 [IU] | Freq: Two times a day (BID) | SUBCUTANEOUS | Status: DC
  Administered 2015-06-01 – 2015-06-02 (×3): 48 [IU] via SUBCUTANEOUS
  Filled 2015-06-01 (×4): qty 0.48

## 2015-06-01 NOTE — Clinical Social Work Placement (Signed)
   CLINICAL SOCIAL WORK PLACEMENT  NOTE  Date:  06/01/2015  Patient Details  Name: Katherine [name] MRN: [id number] Date of Birth: 1928-02-16  Clinical Social Work is seeking post-discharge placement for this patient at the [place] level of care (*CSW will initial, date and re-position this form in  chart as items are completed):  Yes   Patient/family provided with [facility] Work Department's list of facilities offering this level of care within the geographic area requested by the patient (or if unable, by the patient's family).  Yes   Patient/family informed of their freedom to choose among providers that offer the needed level of care, that participate in Medicare, Medicaid or managed care program needed by the patient, have an available bed and are willing to accept the patient.  Yes   Patient/family informed of [facility] ownership interest in [facility] and [facility], as well as of the fact that they are under no obligation to receive care at these facilities.  PASRR submitted to EDS on 05/31/15     PASRR number received on 05/31/15     Existing PASRR number confirmed on       FL2 transmitted to all facilities in geographic area requested by pt/family on 05/31/15     FL2 transmitted to all facilities within larger geographic area on       Patient informed that his/her managed care company has contracts with or will negotiate with certain facilities, including the following:            Patient/family informed of bed offers received.  Patient chooses bed at       Physician recommends and patient chooses bed at      Patient to be transferred to   on  .  Patient to be transferred to facility by       Patient family notified on   of transfer.  Name of family member notified:        PHYSICIAN Please sign FL2     Additional Comment:    _______________________________________________ Cranford Mon,  LCSW 06/01/2015, 9:59 AM

## 2015-06-01 NOTE — Progress Notes (Addendum)
      [address]       [place] [address]             [phone]        6 Days Post-Op Procedure(s) (LRB): TRANSCATHETER AORTIC VALVE REPLACEMENT, TRANSFEMORAL (N/A) TRANSESOPHAGEAL ECHOCARDIOGRAM (TEE) (N/A)  Subjective: Patient with cough, productive at times.  Objective: Vital signs in last 24 hours: Temp:  [98 F (36.7 C)-98.6 F (37 C)] 98.6 F (37 C) (06/26 2017) Pulse Rate:  [62-87] 87 (06/27 0356) Cardiac Rhythm:  [-] Normal sinus rhythm (06/27 0400) Resp:  [20-21] 20 (06/26 2017) BP: (126-192)/(41-83) 127/44 mmHg (06/27 0602) SpO2:  [98 %-100 %] 100 % (06/27 0356) Weight:  [231 lb 0.7 oz (104.8 kg)] 231 lb 0.7 oz (104.8 kg) (06/27 0412)  Pre op weight 106 kg Current Weight  06/01/15 231 lb 0.7 oz (104.8 kg)      Intake/Output from previous day: 06/26 0701 - 06/27 0700 In: -  Out: 950 [Urine:950]   Physical Exam:  Cardiovascular: RRR Pulmonary: Diminished at bases Abdomen: Soft, non tender, bowel sounds present. Extremities: Mild bilateral lower extremity edema. Wound: Clean and dry.  No erythema or signs of infection.  Lab Results: CBC: Recent Labs  05/30/15 0428 05/31/15 1020  WBC 12.2* 9.4  HGB 12.0 10.9*  HCT 34.2* 31.6*  PLT 244 252   BMET:  Recent Labs  05/30/15 0428  NA 135  K 4.5  CL 98*  CO2 26  GLUCOSE 151*  BUN 24*  CREATININE 0.90  CALCIUM 10.6*    PT/INR:  Lab Results  Component Value Date   INR 1.17 05/26/2015   INR 1.01 05/22/2015   INR 1.0 03/17/2015   ABG:  INR: Will add last result for INR, ABG once components are confirmed Will add last 4 CBG results once components are confirmed  Assessment/Plan:  1. CV - SR in the 80's. On Bystolic 10 mg daily, Lisinopril 10 mg daily, and Plavix 75 mg daily. 2.  Pulmonary - On 2 liters of oxygen via [place]. Mucinex for cough. 3. Volume Overload - On Lasix 80 IV bid 4.  Acute blood loss anemia - H and H yesterday 10.9 and 31.6 5. DM-CBGs  159/172/120. On Insulin. Metformin not restarted yet. Pre op HGA1C 8. Will need follow up with medical doctor after discharge. 6. Management per cardiology  ZIMMERMAN,DONIELLE MPA-C 06/01/2015,7:21 AM  I have seen and examined the patient and agree with the assessment and plan as outlined.  Anticipate SNF placement.  Rexene Alberts 06/01/2015 8:29 AM

## 2015-06-01 NOTE — Progress Notes (Signed)
     SUBJECTIVE: No complaints.   BP 127/44 mmHg  Pulse 87  Temp(Src) 98.6 F (37 C) (Oral)  Resp 20  Ht 5\' 2"  (1.575 m)  Wt 231 lb 0.7 oz (104.8 kg)  BMI 42.25 kg/m2  SpO2 100%  Intake/Output Summary (Last 24 hours) at 06/01/15 1056 Last data filed at 06/01/15 0740  Gross per 24 hour  Intake      0 ml  Output   1210 ml  Net  -1210 ml    PHYSICAL EXAM General: Well developed, well nourished, in no acute distress. Alert and oriented x 3.  Psych:  Good affect, responds appropriately Neck: No JVD. No masses noted.  Lungs: Clear bilaterally with no wheezes or rhonci noted.  Heart: RRR with soft systolic murmur noted. Abdomen: Bowel sounds are present. Soft, non-tender.  Extremities: No lower extremity edema.   LABS: Basic Metabolic Panel:  Recent Labs  05/30/15 0428  NA 135  K 4.5  CL 98*  CO2 26  GLUCOSE 151*  BUN 24*  CREATININE 0.90  CALCIUM 10.6*   CBC:  Recent Labs  05/30/15 0428 05/31/15 1020  WBC 12.2* 9.4  NEUTROABS  --  6.5  HGB 12.0 10.9*  HCT 34.2* 31.6*  MCV 77.4* 78.4  PLT 244 252   Current Meds: . antiseptic oral rinse  7 mL Mouth Rinse BID  . clopidogrel  75 mg Oral Q breakfast  . enoxaparin (LOVENOX) injection  40 mg Subcutaneous Q24H  . erythromycin  1 application Right Eye [id number]  . furosemide  80 mg Intravenous BID  . guaiFENesin  600 mg Oral BID  . hydrALAZINE  50 mg Oral 3 times per day  . insulin aspart  0-24 Units Subcutaneous 6 times per day  . insulin detemir  20 Units Subcutaneous BID  . lisinopril  10 mg Oral Daily  . loratadine  10 mg Oral Daily  . memantine  28 mg Oral q morning - 10a  . nebivolol  10 mg Oral Daily  . pantoprazole  40 mg Oral Daily  . pneumococcal 23 valent vaccine  0.5 mL Intramuscular Tomorrow-1000  . rosuvastatin  20 mg Oral QHS    ASSESSMENT AND PLAN:  1. Severe aortic valve stenosis: POD #6 s/p TAVR. Continue Plavix.Will use Plavix alone without ASA given advanced age. Echo with normally  functioning valve 05/27/15.   2. HTN: BP is better controlled today. Continue current meds.   3. DM: Blood sugars have been elevated. Will increase insulin to home dosage.   4. Acute diastolic CHF:  She is net negative 2.6 liters since admission. Chest x-ray yesterday with mild pulmonary edema.  Still may have mild volume overload. Will continue IV Lasix today. Check BMET.   5. Dispo: Will need SNF placement. Appreciate Case management/SW assistance. May be ready for discharge to SNF by 06/02/15.   Katherine [name]  6/27/201610:56 AM

## 2015-06-01 NOTE — Progress Notes (Signed)
Utilization review completed.  

## 2015-06-01 NOTE — Care Management (Signed)
Important Message  Patient Details  Name: Katherine [name] MRN: [id number] Date of Birth: Dec 10, 1927   Medicare Important Message Given:  Yes-second notification given    Nathen May 06/01/2015, 1:03 PM

## 2015-06-01 NOTE — Progress Notes (Signed)
CSW provided bed offers to patient daughter, [name], she will review offers and inform CSW of her decision.    Pt daughter seems to think the pt will not be able to DC for a few days due to new diagnosis of pneumonia from the weekend.  CSW will continue to follow.  Domenica Reamer, [place] Social Worker [phone]

## 2015-06-01 NOTE — Progress Notes (Signed)
Physical Therapy Treatment Patient Details Name: Katherine [name] MRN: [id number] DOB: [birth year] Today's Date: 06/01/2015    History of Present Illness Pt atmitted 6/21 for SOB and DOE. Pt found to have severe aortic stenosis. Pt with transcatheter aortic valve replacement -left transfemoral apprach on 6/21.    PT Comments    Pt progressing slowly towards physical therapy goals. Pt continues to be confused, however follows commands fairly well during mobility. Pt continues to fatigue quickly and active assist was provided during LE therapeutic exercise for range. Will continue to follow and progress as able per POC.   Follow Up Recommendations  SNF;Supervision/Assistance - 24 hour     Equipment Recommendations  None recommended by PT    Recommendations for Other Services       Precautions / Restrictions Precautions Precautions: Fall Restrictions Weight Bearing Restrictions: No    Mobility  Bed Mobility Overal bed mobility: Needs Assistance Bed Mobility: Supine to Sit     Supine to sit: Mod assist;+2 for physical assistance     General bed mobility comments: Pt required assist with LE's for initiating movement to EOB. +2 assist for trunk elevation and HHA required to achieve full sitting position.   Transfers Overall transfer level: Needs assistance Equipment used: Rolling walker (2 wheeled) Transfers: Sit to/from Stand Sit to Stand: Mod assist;+2 physical assistance Stand pivot transfers: Mod assist;+2 physical assistance       General transfer comment: +2 assist provided to achieve full standing position. Once standing, pt was able to take shuffling pivotal steps around to the recliner chair, however required increased cueing for sequencing and technique. Followed commands well to reach back for the arm rests of chair for controlled descent to sitting.   Ambulation/Gait             General Gait Details: Unable to progress gait training at this  time   Stairs            Wheelchair Mobility    Modified Rankin (Stroke Patients Only)       Balance Overall balance assessment: Needs assistance Sitting-balance support: Feet supported;No upper extremity supported Sitting balance-Leahy Scale: Poor Sitting balance - Comments: Assist to maintain static sitting at times   Standing balance support: Bilateral upper extremity supported;During functional activity Standing balance-Leahy Scale: Poor                      Cognition Arousal/Alertness: Awake/alert Behavior During Therapy: WFL for tasks assessed/performed Overall Cognitive Status: Impaired/Different from baseline Area of Impairment: Orientation;Attention;Memory;Problem solving Orientation Level: Disoriented to;Place;Time;Situation Current Attention Level: Focused Memory: Decreased short-term memory       Problem Solving: Slow processing;Difficulty sequencing;Requires verbal cues;Requires tactile cues General Comments: As soon as therapist entered room pt asking about what day it is, and appeared confused with her surroundings. Pt was reoriented to place, time and situation.     Exercises General Exercises - Lower Extremity Long Arc Quad: 10 reps;Both Hip ABduction/ADduction: 10 reps;Both Hip Flexion/Marching: 10 reps;Both    General Comments General comments (skin integrity, edema, etc.): Pt with glasses present in room. Would be beneficial to have her don them prior to transfer next session.      Pertinent Vitals/Pain Pain Assessment: No/denies pain    Home Living Family/patient expects to be discharged to:: Skilled nursing facility Living Arrangements: Children                  Prior Function  PT Goals (current goals can now be found in the care plan section) Acute Rehab PT Goals Patient Stated Goal: didn't state PT Goal Formulation: With patient/family Time For Goal Achievement: 06/03/15 Potential to Achieve Goals:  Good Progress towards PT goals: Progressing toward goals    Frequency  Min 3X/week    PT Plan Current plan remains appropriate    Co-evaluation             End of Session Equipment Utilized During Treatment: Gait belt Activity Tolerance: Patient tolerated treatment well Patient left: in chair;with call bell/phone within reach;with nursing/sitter in room;with family/visitor present     Time: [id number] PT Time Calculation (min) (ACUTE ONLY): 22 min  Charges:  $Therapeutic Activity: 8-22 mins                    G Codes:      Rolinda Roan June 15, 2015, 10:33 AM   Rolinda Roan, PT, DPT Acute Rehabilitation Services Pager: [phone]

## 2015-06-02 DIAGNOSIS — K5900 Constipation, unspecified: Secondary | ICD-10-CM

## 2015-06-02 LAB — GLUCOSE, CAPILLARY
GLUCOSE-CAPILLARY: 106 mg/dL — AB (ref 65–99)
GLUCOSE-CAPILLARY: 121 mg/dL — AB (ref 65–99)
Glucose-Capillary: 230 mg/dL — ABNORMAL HIGH (ref 65–99)
Glucose-Capillary: 249 mg/dL — ABNORMAL HIGH (ref 65–99)
Glucose-Capillary: 259 mg/dL — ABNORMAL HIGH (ref 65–99)
Glucose-Capillary: 66 mg/dL (ref 65–99)
Glucose-Capillary: 74 mg/dL (ref 65–99)
Glucose-Capillary: 95 mg/dL (ref 65–99)

## 2015-06-02 LAB — BASIC METABOLIC PANEL
ANION GAP: 6 (ref 5–15)
BUN: 31 mg/dL — AB (ref 6–20)
CO2: 33 mmol/L — ABNORMAL HIGH (ref 22–32)
Calcium: 10 mg/dL (ref 8.9–10.3)
Chloride: 102 mmol/L (ref 101–111)
Creatinine, Ser: 0.96 mg/dL (ref 0.44–1.00)
GFR calc Af Amer: >60 mL/min (ref >60)
GFR, EST NON AFRICAN AMERICAN: 52 mL/min — AB (ref >60)
Glucose, Bld: 101 mg/dL — ABNORMAL HIGH (ref 65–99)
POTASSIUM: 3.3 mmol/L — AB (ref 3.5–5.1)
Sodium: 141 mmol/L (ref 135–145)

## 2015-06-02 MED ORDER — LACTULOSE 10 GM/15ML PO SOLN
30.0000 g | Freq: Every day | ORAL | Status: DC | PRN
  Filled 2015-06-02: qty 45

## 2015-06-02 MED ORDER — POTASSIUM CHLORIDE CRYS ER 20 MEQ PO TBCR
30.0000 meq | EXTENDED_RELEASE_TABLET | Freq: Once | ORAL | Status: AC
  Administered 2015-06-02: 30 meq via ORAL
  Filled 2015-06-02: qty 1

## 2015-06-02 MED ORDER — FUROSEMIDE 40 MG PO TABS
40.0000 mg | ORAL_TABLET | Freq: Every day | ORAL | Status: DC
  Administered 2015-06-02 – 2015-06-03 (×2): 40 mg via ORAL
  Filled 2015-06-02 (×2): qty 1

## 2015-06-02 MED ORDER — POTASSIUM CHLORIDE CRYS ER 20 MEQ PO TBCR
40.0000 meq | EXTENDED_RELEASE_TABLET | Freq: Once | ORAL | Status: AC
  Administered 2015-06-02: 40 meq via ORAL
  Filled 2015-06-02: qty 2

## 2015-06-02 MED ORDER — LINACLOTIDE 145 MCG PO CAPS
145.0000 ug | ORAL_CAPSULE | Freq: Every day | ORAL | Status: DC
  Administered 2015-06-02 – 2015-06-03 (×2): 145 ug via ORAL
  Filled 2015-06-02 (×2): qty 1

## 2015-06-02 NOTE — Progress Notes (Signed)
Hypoglycemic Event  CBG: 66/16:21  Treatment: 4 oz apple juice  Symptoms: none  Follow-up CBG: [id number] CBG Result:74  Possible Reasons for Event: unknown  Comments/MD notified:no    [name], Katherine [name]  Remember to initiate Hypoglycemia Order Set & complete

## 2015-06-02 NOTE — Progress Notes (Signed)
Speech Language Pathology Treatment: Dysphagia  Patient Details Name: Katherine [name] MRN: [id number] DOB: 05-15-1928 Today's Date: 06/02/2015 Time: 3749-6646 SLP Time Calculation (min) (ACUTE ONLY): 19 min  Assessment / Plan / Recommendation Clinical Impression  Reinforced precautions with daughter and pt. Pt consuming meal and small cup sips of liquids without difficulty. Recommend pt continue to avoid straws, consume regular diet and thin liquids. No SLP f/u needed, will sign off.    HPI Other Pertinent Information: The patient, an 81 year old female, was admitted to the hospital due to severe aortic stenosis for a transcatheter aortic valve replacement.  Nursing reports changes in the patient's respiratory status and concerns for aspiration.  The patient was made NPO this AM.  Most current chest x-ray from 05/30/2015 showed vascular congestion, cardiolegaly and bibasilar opacities with possible mild PNA.     Pertinent Vitals Pain Assessment: No/denies pain  SLP Plan  All goals met    Recommendations Diet recommendations: Regular;Thin liquid Liquids provided via: Cup;No straw Medication Administration: Whole meds with puree Supervision: Patient able to self feed Postural Changes and/or Swallow Maneuvers: Seated upright 90 degrees              Oral Care Recommendations: Oral care BID Plan: All goals met    GO    Herbie Baltimore, MA CCC-SLP [phone]  Lynann Beaver 06/02/2015, 9:35 AM

## 2015-06-02 NOTE — Progress Notes (Signed)
Pt daughter chooses [facility]- facility has bed available today.  Domenica Reamer, [place] Social Worker [phone]

## 2015-06-02 NOTE — Progress Notes (Signed)
     SUBJECTIVE: No chest pain or SOB  BP 140/43 mmHg  Pulse 74  Temp(Src) 99.7 F (37.6 C) (Oral)  Resp 18  Ht 5\' 2"  (1.575 m)  Wt 235 lb 10.8 oz (106.9 kg)  BMI 43.09 kg/m2  SpO2 96%  Intake/Output Summary (Last 24 hours) at 06/02/15 1044 Last data filed at 06/02/15 0700  Gross per 24 hour  Intake    230 ml  Output   2300 ml  Net  -2070 ml    PHYSICAL EXAM General: Well developed, well nourished, in no acute distress. Alert and oriented x 3.  Psych:  Good affect, responds appropriately Neck: No JVD. No masses noted.  Lungs: Clear bilaterally with no wheezes or rhonci noted.  Heart: RRR with mild systolic murmur noted. Abdomen: Bowel sounds are present. Soft, non-tender.  Extremities: No lower extremity edema.   LABS: Basic Metabolic Panel:  Recent Labs  06/01/15 1203 06/02/15 0612  NA 139 141  K 3.7 3.3*  CL 98* 102  CO2 32 33*  GLUCOSE 201* 101*  BUN 30* 31*  CREATININE 0.95 0.96  CALCIUM 10.2 10.0   CBC:  Recent Labs  05/31/15 1020  WBC 9.4  NEUTROABS 6.5  HGB 10.9*  HCT 31.6*  MCV 78.4  PLT 252    Current Meds: . antiseptic oral rinse  7 mL Mouth Rinse BID  . clopidogrel  75 mg Oral Q breakfast  . docusate sodium  100 mg Oral BID  . enoxaparin (LOVENOX) injection  40 mg Subcutaneous Q24H  . erythromycin  1 application Right Eye [id number]  . furosemide  80 mg Intravenous BID  . guaiFENesin  600 mg Oral BID  . hydrALAZINE  50 mg Oral 3 times per day  . insulin aspart  0-24 Units Subcutaneous 6 times per day  . insulin detemir  48 Units Subcutaneous BID  . lisinopril  10 mg Oral Daily  . loratadine  10 mg Oral Daily  . memantine  28 mg Oral q morning - 10a  . metFORMIN  500 mg Oral Q breakfast  . nebivolol  10 mg Oral Daily  . pantoprazole  40 mg Oral Daily  . pneumococcal 23 valent vaccine  0.5 mL Intramuscular Tomorrow-1000  . polyethylene glycol  17 g Oral Daily  . potassium chloride  30 mEq Oral Once  . potassium chloride  40 mEq Oral  Once  . rosuvastatin  20 mg Oral QHS     ASSESSMENT AND PLAN:  1. Severe aortic valve stenosis: POD #7 s/p TAVR. Continue Plavix.Will use Plavix alone without ASA given advanced age. Echo with normally functioning valve 05/27/15.   2. HTN: BP is controlled. Continue current meds.   3. Diabetes mellitus with circulatory complications: Blood sugars better controlled on home insulin regimen. Metformin has been restarted.    4. Acute diastolic CHF: She is net negative 5 liters since admission. Will change to po Lasix today. (40 mg daily-home dosage).  5. Hypokalemia: Will replace potassium this am.   6. Constipation: Will restart home laxative and if no bowel movement by 2pm, will give lactulose  7. Dispo: Will need SNF placement. Appreciate Case management/SW assistance. I would like to continue ambulation today with PT, replace potassium and make sure her respiratory status is stable over next 24 hours. Plan d/c to SNF tomorrow 06/03/15.  Plan discussed with pt and daughter at bedside  [facility] ([facility])  6/28/201610:44 AM

## 2015-06-02 NOTE — Progress Notes (Addendum)
      [address]       [place] [address]             [phone]        7 Days Post-Op Procedure(s) (LRB): TRANSCATHETER AORTIC VALVE REPLACEMENT, TRANSFEMORAL (N/A) TRANSESOPHAGEAL ECHOCARDIOGRAM (TEE) (N/A)  Subjective: Patient with cough, productive at times.  Objective: Vital signs in last 24 hours: Temp:  [98.7 F (37.1 C)-99.7 F (37.6 C)] 99.7 F (37.6 C) (06/28 0433) Pulse Rate:  [67-75] 74 (06/28 0555) Cardiac Rhythm:  [-] Normal sinus rhythm (06/27 2000) Resp:  [18] 18 (06/28 0433) BP: (126-144)/(43-52) 140/43 mmHg (06/28 0555) SpO2:  [95 %-99 %] 96 % (06/28 0433) Weight:  [235 lb 10.8 oz (106.9 kg)] 235 lb 10.8 oz (106.9 kg) (06/28 0433)  Pre op weight 106 kg Current Weight  06/02/15 235 lb 10.8 oz (106.9 kg)      Intake/Output from previous day: 06/27 0701 - 06/28 0700 In: 230 [P.O.:230] Out: 2560 [Urine:2560]   Physical Exam:  Cardiovascular: RRR Pulmonary: Slightly diminished at bases Abdomen: Soft, non tender, bowel sounds present. Extremities: Mild bilateral lower extremity edema. Wound: Clean and dry.  No erythema or signs of infection.  Lab Results: CBC:  Recent Labs  05/31/15 1020  WBC 9.4  HGB 10.9*  HCT 31.6*  PLT 252   BMET:   Recent Labs  06/01/15 1203 06/02/15 0612  NA 139 141  K 3.7 3.3*  CL 98* 102  CO2 32 33*  GLUCOSE 201* 101*  BUN 30* 31*  CREATININE 0.95 0.96  CALCIUM 10.2 10.0    PT/INR:  Lab Results  Component Value Date   INR 1.17 05/26/2015   INR 1.01 05/22/2015   INR 1.0 03/17/2015   ABG:  INR: Will add last result for INR, ABG once components are confirmed Will add last 4 CBG results once components are confirmed  Assessment/Plan:  1. CV - SR in the 80's. On Bystolic 10 mg daily, Lisinopril 10 mg daily, and Plavix 75 mg daily. 2.  Pulmonary - On room air. Mucinex for cough. 3. Volume Overload - On Lasix 80 IV bid 4.  Acute blood loss anemia - H and H yesterday 10.9 and  31.6 5. DM-CBGs 249/230/106. On Insulin and Metformin to be started this am. Pre op HGA1C 8. Will need follow up with medical doctor after discharge. 6. Supplement potassium 7. Will need SNF when ready for discharge;management per cardiology  ZIMMERMAN,DONIELLE MPA-C 06/02/2015,7:23 AM    I have seen and examined the patient and agree with the assessment and plan as outlined.  Slow progress.    Rexene Alberts 06/02/2015 7:03 PM

## 2015-06-02 NOTE — Care Management Note (Signed)
Case Management Note Note started by [place]  Patient Details  Name: Katherine [name] MRN: [id number] Date of Birth: 1927/12/17  Subjective/Objective:      Pt lives with dtr, Katherine [name], who works @ [employer].  She states Katherine [name] makes the decisions for her and requests that CSW call Katherine [name] re rehab @ SNF.                           Expected Discharge Plan:  Skilled Nursing Facility  In-House Referral:  Clinical Social Work  Discharge planning Services  CM Consult  Post Acute Care Choice:    Choice offered to:     DME Arranged:    DME Agency:     HH Arranged:    HH Agency:     Status of Service:  In process, will continue to follow  Medicare Important Message Given:  Yes-second notification given Date Medicare IM Given:  05/29/15 Medicare IM give by:  Babette Relic RN Date Additional Medicare IM Given:    Additional Medicare Important Message give by:     If discussed at [place] of Stay Meetings, dates discussed:  06/02/15  Additional Comments:  06/02/15- pt s/p TAVR-  Post op volume overload with IV lasix given 06/01/15- d/cplan for SNF when medically stable- CSW following.   Dawayne Patricia, RN 06/02/2015, 10:23 AM

## 2015-06-03 DIAGNOSIS — R4789 Other speech disturbances: Secondary | ICD-10-CM | POA: Diagnosis not present

## 2015-06-03 DIAGNOSIS — N390 Urinary tract infection, site not specified: Secondary | ICD-10-CM | POA: Diagnosis not present

## 2015-06-03 DIAGNOSIS — M25519 Pain in unspecified shoulder: Secondary | ICD-10-CM | POA: Diagnosis not present

## 2015-06-03 DIAGNOSIS — I5032 Chronic diastolic (congestive) heart failure: Secondary | ICD-10-CM | POA: Diagnosis not present

## 2015-06-03 DIAGNOSIS — I10 Essential (primary) hypertension: Secondary | ICD-10-CM | POA: Diagnosis not present

## 2015-06-03 DIAGNOSIS — Z954 Presence of other heart-valve replacement: Secondary | ICD-10-CM | POA: Diagnosis not present

## 2015-06-03 DIAGNOSIS — I350 Nonrheumatic aortic (valve) stenosis: Secondary | ICD-10-CM | POA: Diagnosis not present

## 2015-06-03 DIAGNOSIS — F0390 Unspecified dementia without behavioral disturbance: Secondary | ICD-10-CM | POA: Diagnosis not present

## 2015-06-03 DIAGNOSIS — E876 Hypokalemia: Secondary | ICD-10-CM | POA: Diagnosis not present

## 2015-06-03 DIAGNOSIS — Z952 Presence of prosthetic heart valve: Secondary | ICD-10-CM | POA: Diagnosis not present

## 2015-06-03 DIAGNOSIS — E785 Hyperlipidemia, unspecified: Secondary | ICD-10-CM | POA: Diagnosis not present

## 2015-06-03 DIAGNOSIS — E1165 Type 2 diabetes mellitus with hyperglycemia: Secondary | ICD-10-CM | POA: Diagnosis not present

## 2015-06-03 DIAGNOSIS — D62 Acute posthemorrhagic anemia: Secondary | ICD-10-CM | POA: Diagnosis not present

## 2015-06-03 DIAGNOSIS — H00013 Hordeolum externum right eye, unspecified eyelid: Secondary | ICD-10-CM | POA: Diagnosis not present

## 2015-06-03 DIAGNOSIS — R278 Other lack of coordination: Secondary | ICD-10-CM | POA: Diagnosis not present

## 2015-06-03 DIAGNOSIS — Z48812 Encounter for surgical aftercare following surgery on the circulatory system: Secondary | ICD-10-CM | POA: Diagnosis not present

## 2015-06-03 DIAGNOSIS — K219 Gastro-esophageal reflux disease without esophagitis: Secondary | ICD-10-CM | POA: Diagnosis not present

## 2015-06-03 DIAGNOSIS — I351 Nonrheumatic aortic (valve) insufficiency: Secondary | ICD-10-CM | POA: Diagnosis not present

## 2015-06-03 DIAGNOSIS — I5031 Acute diastolic (congestive) heart failure: Secondary | ICD-10-CM | POA: Diagnosis not present

## 2015-06-03 DIAGNOSIS — R41841 Cognitive communication deficit: Secondary | ICD-10-CM | POA: Diagnosis not present

## 2015-06-03 DIAGNOSIS — K5900 Constipation, unspecified: Secondary | ICD-10-CM | POA: Diagnosis not present

## 2015-06-03 DIAGNOSIS — R0602 Shortness of breath: Secondary | ICD-10-CM | POA: Diagnosis not present

## 2015-06-03 DIAGNOSIS — E119 Type 2 diabetes mellitus without complications: Secondary | ICD-10-CM | POA: Diagnosis not present

## 2015-06-03 DIAGNOSIS — F419 Anxiety disorder, unspecified: Secondary | ICD-10-CM | POA: Diagnosis not present

## 2015-06-03 DIAGNOSIS — F411 Generalized anxiety disorder: Secondary | ICD-10-CM | POA: Diagnosis not present

## 2015-06-03 DIAGNOSIS — M25529 Pain in unspecified elbow: Secondary | ICD-10-CM | POA: Diagnosis not present

## 2015-06-03 DIAGNOSIS — M6281 Muscle weakness (generalized): Secondary | ICD-10-CM | POA: Diagnosis not present

## 2015-06-03 DIAGNOSIS — I059 Rheumatic mitral valve disease, unspecified: Secondary | ICD-10-CM | POA: Diagnosis not present

## 2015-06-03 DIAGNOSIS — R1312 Dysphagia, oropharyngeal phase: Secondary | ICD-10-CM | POA: Diagnosis not present

## 2015-06-03 DIAGNOSIS — R262 Difficulty in walking, not elsewhere classified: Secondary | ICD-10-CM | POA: Diagnosis not present

## 2015-06-03 DIAGNOSIS — I340 Nonrheumatic mitral (valve) insufficiency: Secondary | ICD-10-CM | POA: Diagnosis not present

## 2015-06-03 DIAGNOSIS — I2510 Atherosclerotic heart disease of native coronary artery without angina pectoris: Secondary | ICD-10-CM | POA: Diagnosis not present

## 2015-06-03 DIAGNOSIS — I5030 Unspecified diastolic (congestive) heart failure: Secondary | ICD-10-CM | POA: Diagnosis not present

## 2015-06-03 DIAGNOSIS — R5381 Other malaise: Secondary | ICD-10-CM | POA: Diagnosis not present

## 2015-06-03 LAB — BASIC METABOLIC PANEL
Anion gap: 13 (ref 5–15)
BUN: 30 mg/dL — AB (ref 6–20)
CHLORIDE: 101 mmol/L (ref 101–111)
CO2: 30 mmol/L (ref 22–32)
CREATININE: 1.03 mg/dL — AB (ref 0.44–1.00)
Calcium: 10.7 mg/dL — ABNORMAL HIGH (ref 8.9–10.3)
GFR calc Af Amer: 56 mL/min — ABNORMAL LOW (ref >60)
GFR calc non Af Amer: 48 mL/min — ABNORMAL LOW (ref >60)
Glucose, Bld: 37 mg/dL — CL (ref 65–99)
Potassium: 3.9 mmol/L (ref 3.5–5.1)
Sodium: 144 mmol/L (ref 135–145)

## 2015-06-03 LAB — GLUCOSE, CAPILLARY
GLUCOSE-CAPILLARY: 34 mg/dL — AB (ref 65–99)
GLUCOSE-CAPILLARY: 118 mg/dL — AB (ref 65–99)
Glucose-Capillary: 101 mg/dL — ABNORMAL HIGH (ref 65–99)
Glucose-Capillary: 108 mg/dL — ABNORMAL HIGH (ref 65–99)
Glucose-Capillary: 50 mg/dL — ABNORMAL LOW (ref 65–99)
Glucose-Capillary: 62 mg/dL — ABNORMAL LOW (ref 65–99)
Glucose-Capillary: 134 mg/dL — ABNORMAL HIGH (ref 65–99)

## 2015-06-03 MED ORDER — LISINOPRIL 10 MG PO TABS
10.0000 mg | ORAL_TABLET | Freq: Every day | ORAL | Status: DC
Start: 2015-06-03 — End: 2020-03-30

## 2015-06-03 MED ORDER — TRAMADOL HCL 50 MG PO TABS: 50.0000 mg | ORAL_TABLET | ORAL | Status: DC | PRN

## 2015-06-03 MED ORDER — POLYETHYLENE GLYCOL 3350 17 G PO PACK: 17.0000 g | PACK | Freq: Every day | ORAL | Status: DC

## 2015-06-03 MED ORDER — HYDRALAZINE HCL 50 MG PO TABS: 50.0000 mg | ORAL_TABLET | Freq: Three times a day (TID) | ORAL | Status: DC

## 2015-06-03 MED ORDER — INSULIN DETEMIR 100 UNIT/ML ~~LOC~~ SOLN
30.0000 [IU] | Freq: Two times a day (BID) | SUBCUTANEOUS | Status: DC
  Administered 2015-06-03: 30 [IU] via SUBCUTANEOUS
  Filled 2015-06-03 (×2): qty 0.3

## 2015-06-03 MED ORDER — FUROSEMIDE 40 MG PO TABS: 40.0000 mg | ORAL_TABLET | Freq: Every day | ORAL | Status: DC

## 2015-06-03 MED ORDER — GUAIFENESIN ER 600 MG PO TB12: 600.0000 mg | ORAL_TABLET | Freq: Two times a day (BID) | ORAL | Status: DC

## 2015-06-03 MED ORDER — INSULIN DETEMIR 100 UNIT/ML ~~LOC~~ SOLN: 30.0000 [IU] | Freq: Two times a day (BID) | SUBCUTANEOUS | Status: DC

## 2015-06-03 MED ORDER — LACTULOSE 10 GM/15ML PO SOLN: 30.0000 g | Freq: Every day | ORAL | Status: DC | PRN

## 2015-06-03 MED ORDER — DOCUSATE SODIUM 100 MG PO CAPS: 100.0000 mg | ORAL_CAPSULE | Freq: Two times a day (BID) | ORAL | Status: DC

## 2015-06-03 MED ORDER — CLOPIDOGREL BISULFATE 75 MG PO TABS: 75.0000 mg | ORAL_TABLET | Freq: Every day | ORAL | Status: DC

## 2015-06-03 MED ORDER — ALPRAZOLAM 0.25 MG PO TABS: 0.1250 mg | ORAL_TABLET | Freq: Two times a day (BID) | ORAL | Status: DC | PRN

## 2015-06-03 MED ORDER — GLUCOSE 40 % PO GEL
ORAL | Status: AC
  Administered 2015-06-03: 37.5 g via ORAL
  Filled 2015-06-03: qty 1

## 2015-06-03 NOTE — Discharge Summary (Signed)
CARDIOLOGY DISCHARGE SUMMARY   Patient ID: Katherine [name] MRN: [id number] DOB/AGE: [birth year] 81 y.o.  Admit date: 05/26/2015 Discharge date: 06/03/2015  PCP: Delphina Cahill, MD Primary Cardiologist: Dr Debara Pickett TAVR MD: Dr Angelena Form  Primary Discharge Diagnosis:    Severe aortic valve stenosis Secondary Discharge Diagnosis:  Active Problems:   Essential hypertension   Acute diastolic congestive heart failure, NYHA class 3   Severe aortic stenosis   SOB (shortness of breath)   Deconditioning    Acute blood loss anemia  Consults: Dr. Cyndia Bent, Speech Therapy, Physical Therapy  Procedures:    postoperative swallow eval   postoperative 2-D echocardiogram   intraoperative transesophageal echocardiogram  Transcatheter Aortic Valve Replacement - Transfemoral Approach Edwards Sapien 3 THV (size 23 mm, model XX:[id number], serial [id number] )   Hospital Course: Katherine SHIPLETT is a 81 y.o. female with a history of  severe aortic stenosis. Her symptoms from the left ear include marked fatigue, and dyspnea on exertion. She was evaluated by Dr. Cyndia Bent and considered a candidate for TAVR. She came to the hospital for the procedure on 05/26/2015.  She tolerated the procedure well. She was transported to the surgical intensive care.   She was continued on Plavix but aspirin was discontinued because of her age. She initially required a Nipride drip for blood pressure control but was started on  scheduled IV metoprolol and IV hydralazine when necessary. As she recovered from anesthesia, she was restarted on her home dose of bystolic, and the IV meds were weaned off.    She had some problems with hypoglycemia and her insulin dose was decreased. She is to continue on the metformin.   She was seen by physical therapy and was felt to have impaired cognition, impaired vision, deconditioning and generalized weakness. Skilled nursing facility placement was recommended and this  was pursued.   On 06/25 ,she had  Acute pulmonary edema and was diuresed.  She was also significantly hypertensive. Her medications were adjusted and her blood pressure improved.  By 06/28, she was stable for transitioned to by mouth Lasix. She was put back on Lasix at her home dose.   On 06/28, she was seen by speech therapy and it was recommended at discharge that she avoids drawls , consume meals with small bites and sips of water. She is clear to be on a regular diet with thin liquids.   On 6/29, she was seen by Dr. Cyndia Bent and by Dr. Angelena Form. Her volume status was stable and she was felt safe to discharge to a skilled nursing facility for continued care and rehabilitation.  She is encouraged to follow-up with her primary care physician after discharge for management of her diabetes.  Labs:   Lab Results  Component Value Date   WBC 9.4 05/31/2015   HGB 10.9* 05/31/2015   HCT 31.6* 05/31/2015   MCV 78.4 05/31/2015   PLT 252 05/31/2015     Recent Labs Lab 06/03/15 0410  NA 144  K 3.9  CL 101  CO2 30  BUN 30*  CREATININE 1.03*  CALCIUM 10.7*  GLUCOSE 37*    Radiology: Dg Chest 1 View 05/30/2015   CLINICAL DATA:  Shortness of breath. Chest x-ray following swallowing study. History of CAD, hypertension, CHF, diabetes.  EXAM: CHEST  1 VIEW  COMPARISON:  05/30/2015  FINDINGS: Status post median sternotomy and CABG. The heart is enlarged. Shallow lung inflation. No pulmonary edema. There is patchy density at the medial right lung base, similar  in appearance to the previous exam. Minimal streaky densities at the left lung base also appear stable.  IMPRESSION: 1. Cardiomegaly. 2. Bibasilar opacities, stable in appearance.   Electronically Signed   By: Nolon Nations M.D.   On: 05/30/2015 14:02   Dg Chest 2 View 05/31/2015   CLINICAL DATA:  Shortness of breath that began last night  EXAM: CHEST  2 VIEW  COMPARISON:  05/30/2015  FINDINGS: There is marked cardiac enlargement. The patient  is status post median sternotomy and CABG procedure. There is diffuse pulmonary vascular congestion. Mild pleural fluid noted. Atelectasis noted in the lung bases.  IMPRESSION: 1. Mild CHF.   Electronically Signed   By: Kerby Moors M.D.   On: 05/31/2015 13:05   Dg Chest 2 View 05/22/2015   CLINICAL DATA:  Transcatheter aortic valve replacement, aortic stenosis  EXAM: CHEST  2 VIEW  COMPARISON:  01/06/2015  FINDINGS: Cardiomegaly is noted. Again noted status post CABG. No acute infiltrate or pulmonary edema. Mild right basilar atelectasis or scarring. Atherosclerotic calcifications of thoracic aorta. Thoracic spine osteopenia.  IMPRESSION: No acute infiltrate or pulmonary edema. Status post CABG. Mild right basilar atelectasis or scarring.   Electronically Signed   By: Lahoma Crocker M.D.   On: 05/22/2015 15:36   Dg Chest Port 1 View 05/30/2015   CLINICAL DATA:  Acute onset of shortness of breath. Initial encounter.  EXAM: PORTABLE CHEST - 1 VIEW  COMPARISON:  Chest radiograph performed 05/27/2015  FINDINGS: The lungs are hypoexpanded. Vascular congestion is noted. Mild bibasilar opacities may reflect atelectasis or mild pneumonia. No pleural effusion or pneumothorax is seen.  The cardiomediastinal silhouette is enlarged. The patient is status post median sternotomy, with evidence of prior CABG. No acute osseous abnormalities are seen.  IMPRESSION: Lungs hypoexpanded. Vascular congestion and cardiomegaly. Mild bibasilar opacities may reflect atelectasis or mild pneumonia.   Electronically Signed   By: Garald Balding M.D.   On: 05/30/2015 05:00   Dg Chest Port 1 View 05/27/2015   CLINICAL DATA:  Post transcatheter aortic valve replacement, severe aortic stenosis  EXAM: PORTABLE CHEST - 1 VIEW  COMPARISON:  Portable chest x-ray of 05/26/2015  FINDINGS: There is little change in overall aeration with basilar atelectasis right greater than left. Cardiomegaly and mild pulmonary vascular congestion remain. The tip of  the Swan-Ganz catheter remains in the pulmonary artery to the right lower lobe. No pneumothorax is seen.  IMPRESSION: 1. Little change in poor aeration with basilar atelectasis right greater than left. 2. Cardiomegaly.  Question mild pulmonary vascular congestion.   Electronically Signed   By: Ivar Drape M.D.   On: 05/27/2015 08:04   Dg Chest Port 1 View 05/26/2015   CLINICAL DATA:  Transcatheter aortic valve replacement. Aortic valve stenosis  EXAM: PORTABLE CHEST - 1 VIEW  COMPARISON:  May 22, 2015  FINDINGS: There is now a prosthetic aortic valve present. Swan-Ganz catheter tip is in the distal right main pulmonary artery. No pneumothorax. There is no edema or consolidation. Heart is mildly enlarged with pulmonary vascular within normal limits. Patient is also status post coronary artery bypass grafting. No adenopathy.  IMPRESSION: Swan-Ganz catheter tip and distal right main pulmonary artery. No pneumothorax. Stable cardiomegaly. No edema or consolidation. Prosthetic aortic valve present.   Electronically Signed   By: Lowella Grip III M.D.   On: 05/26/2015 11:40   Dg Swallowing Func-speech Pathology 05/30/2015    Objective Swallowing Evaluation:    Patient Details  Name: Katherine [name]  [place] MRN: [id number] Date of Birth: Nov 25, 1928  Today's Date: 05/30/2015 Time: SLP Start Time (ACUTE ONLY): 1045-SLP Stop Time (ACUTE ONLY): 1110 SLP Time Calculation (min) (ACUTE ONLY): 25 min.  Nursing reports changes in the patient's  respiratory status and concerns for aspiration.  The patient was made NPO  this AM.  Most current chest x-ray from 05/30/2015 showed vascular  congestion, cardiolegaly and bibasilar opacities with possible mild PNA.     No Data Recorded  Clinical Impressions: Pt presenting with mild oral and mild to moderate  pharyngeal dysphagia. Pt with delay in swallow initiation with thin  liquids at the level of the pyriform sinuses. Delay in swallow initiation  triggered at the valleculae with all  other consistencies including puree,  nectar thick liquids, and regular consistencies. Silent penetration of  thin liquids evidenced when the patient used a straw. This did not pass  below the level of the true vocal cords, however volitional cough did not  clear the penetrates. No penetration or aspiration of thin liquids were  seen when the patient used a cup or teaspoon. Pt with mild pyriform sinus  residue secondary to reduced laryngeal elevation. Recommend regular diet  with thin liquids. NO straw, medicines whole with puree. ST to follow up  for diet tolerance and aspiration precaution education.    Assessment / Plan / Recommendation CHL IP DIET RECOMMENDATION 05/30/2015  SLP Diet Recommendations Age appropriate regular solids;Thin  Liquid Administration via Cup, spoon, NO Straw  Medication Administration Whole with puree  Compensations   Postural Changes and/or Swallow Maneuvers (None)       CHL IP OTHER RECOMMENDATIONS 05/30/2015  Recommended Consults (None)  Oral Care Recommendations Oral care BID  Other Recommendations (None) Arvil Chaco MA, CCC-SLP Acute Care Speech Language Pathologist          Levi Aland 05/30/2015, 2:10 PM     EKG: 05/27/2015 Sinus rhythm, rate 89 LVH  05/27/2015 Conclusions - Left ventricle: Small hyperdynamic ventricle with no SAM but small LVOT gradient. The cavity size was normal. There was moderate concentric hypertrophy. Systolic function was vigorous. The estimated ejection fraction was in the range of 65% to 70%. - Aortic valve: Normal appearing 23 mm Sapien 3 stented valve No significant peri valvular regurgitations Mean gradient 14 mm Hg a bit higher than OR may be related to hyperdynamic function Valve area (VTI): 1.63 cm^2. Valve area (Vmax): 1.39 cm^2. Valve area (Vmean): 1.55 cm^2. - Mitral valve: Calcified annulus. There was mild regurgitation. Valve area by continuity equation (using LVOT flow): 1.82 cm^2. - Left atrium:  The atrium was moderately dilated. - Right atrium: The atrium was mildly dilated. - Atrial septum: No defect or patent foramen ovale was identified. - Tricuspid valve: There was moderate regurgitation. - Pulmonary arteries: PA peak pressure: 53 mm Hg (S). - Pericardium, extracardiac: A trivial pericardial effusion was identified posterior to the heart.  FOLLOW UP PLANS AND APPOINTMENTS No Known Allergies   Medication List    STOP taking these medications        HYDROcodone-acetaminophen 5-325 MG per tablet  Commonly known as:  NORCO/VICODIN      TAKE these medications        acetaminophen 500 MG tablet  Commonly known as:  TYLENOL  Take 1,000 mg by mouth 2 (two) times daily. Take 1000 mg by mouth in the morning and take 1000 mg by mouth in the evening.     ALPRAZolam 0.25 MG tablet  Commonly known as:  XANAX  Take 0.5 tablets (0.125 mg total) by mouth 2 (two) times daily as needed for anxiety or sleep.     clopidogrel 75 MG tablet  Commonly known as:  PLAVIX  Take 1 tablet (75 mg total) by mouth daily with breakfast.     docusate sodium 100 MG capsule  Commonly known as:  COLACE  Take 1 capsule (100 mg total) by mouth 2 (two) times daily.     erythromycin ophthalmic ointment  Place 1 application into the right eye every 3 (three) hours.     fexofenadine 180 MG tablet  Commonly known as:  ALLEGRA  Take 180 mg by mouth daily.     furosemide 40 MG tablet  Commonly known as:  LASIX  Take 1 tablet (40 mg total) by mouth daily.     guaiFENesin 600 MG 12 hr tablet  Commonly known as:  MUCINEX  Take 1 tablet (600 mg total) by mouth 2 (two) times daily.     hydrALAZINE 50 MG tablet  Commonly known as:  APRESOLINE  Take 1 tablet (50 mg total) by mouth every 8 (eight) hours.     insulin detemir 100 UNIT/ML injection  Commonly known as:  LEVEMIR  Inject 0.3 mLs (30 Units total) into the skin 2 (two) times daily.     Ipratropium-Albuterol 20-100 MCG/ACT Aers respimat    Commonly known as:  COMBIVENT RESPIMAT  Inhale 2 puffs into the lungs every 6 (six) hours as needed for wheezing or shortness of breath.     lactulose 10 GM/15ML solution  Commonly known as:  CHRONULAC  Take 45 mLs (30 g total) by mouth daily as needed for mild constipation or severe constipation.     LINZESS 145 MCG Caps capsule  Generic drug:  Linaclotide  Take 145 mcg by mouth daily as needed (for constipation).     lisinopril 10 MG tablet  Commonly known as:  PRINIVIL,ZESTRIL  Take 1 tablet (10 mg total) by mouth daily.     loperamide 2 MG capsule  Commonly known as:  IMODIUM  Take 2 mg by mouth as needed for diarrhea or loose stools.     metFORMIN 500 MG 24 hr tablet  Commonly known as:  GLUCOPHAGE-XR  Take 500 mg by mouth daily with breakfast.     NAMENDA XR 28 MG Cp24 24 hr capsule  Generic drug:  memantine  Take 28 mg by mouth every morning.     nebivolol 10 MG tablet  Commonly known as:  BYSTOLIC  Take 10 mg by mouth every morning.     NEXIUM 20 MG capsule  Generic drug:  esomeprazole  Take 20 mg by mouth daily.     NON FORMULARY  Apply 1-2 application topically 4 (four) times daily. DIC 3 % / BAC 2 % / GAB 5 % / LID-PRIL 2.5 % - 2.5 % / MENT 1 %     polyethylene glycol packet  Commonly known as:  MIRALAX / GLYCOLAX  Take 17 g by mouth daily.     potassium chloride SA 20 MEQ tablet  Commonly known as:  K-DUR,KLOR-CON  Take 20 mEq by mouth daily.     rosuvastatin 20 MG tablet  Commonly known as:  CRESTOR  Take 20 mg by mouth at bedtime.     traMADol 50 MG tablet  Commonly known as:  ULTRAM  Take 1-2 tablets (50-100 mg total) by mouth every 4 (four) hours as needed for moderate pain.  VOLTAREN 1 % Gel  Generic drug:  diclofenac sodium  Apply 2 g topically daily as needed (pain).        Discharge Instructions    Diet - low sodium heart healthy    Complete by:  As directed      Diet Carb Modified    Complete by:  As directed      Increase  activity slowly    Complete by:  As directed           Follow-up Information    Follow up with [facility] On 06/09/2015.   Why:  See provider at 3:15 p.m., please arrive 15 minutes early for paperwork   Contact information:   [place] [phone]      Follow up with Gaye Pollack, MD On 06/10/2015.   Specialty:  Cardiothoracic Surgery   Why:  See provider at 1 PM, please arrive 15 minutes early for paperwork.   Contact information:   [place] [address] [phone]       Follow up with Lauree Chandler, MD On 07/02/2015.   Specialty:  Cardiology   Why:   Echocardiogram at 1:00 PM ,Appointment with M.D. at 2:00 PM, please arrive 15 minutes early for paperwork   Contact information:   [place] [place] [address] [phone]       Schedule an appointment as soon as possible for a visit with Delphina Cahill, MD.   Specialty:  Internal Medicine   Why:   for diabetes management.   Contact information:    [place] [address] [phone]       BRING ALL MEDICATIONS WITH YOU TO FOLLOW UP APPOINTMENTS  Time spent with patient to include physician time: 53 min Signed: Rosaria Ferries, PA-C 06/03/2015, 11:50 AM Co-Sign MD

## 2015-06-03 NOTE — Progress Notes (Signed)
     SUBJECTIVE: No chest pain or SOB. Low blood sugars overnight.   BP 162/46 mmHg  Pulse 76  Temp(Src) 98.3 F (36.8 C) (Oral)  Resp 26  Ht 5\' 2"  (1.575 m)  Wt 222 lb 3.6 oz (100.8 kg)  BMI 40.63 kg/m2  SpO2 98%  Intake/Output Summary (Last 24 hours) at 06/03/15 0711 Last data filed at 06/03/15 0500  Gross per 24 hour  Intake    240 ml  Output    825 ml  Net   -585 ml    PHYSICAL EXAM General: Well developed, well nourished, in no acute distress. Alert and oriented x 3.  Psych:  Good affect, responds appropriately Neck: No JVD. No masses noted.  Lungs: Clear bilaterally with no wheezes or rhonci noted.  Heart: RRR with soft systolic murmur noted. Abdomen: Bowel sounds are present. Soft, non-tender.  Extremities: No lower extremity edema.   LABS: Basic Metabolic Panel:  Recent Labs  06/02/15 0612 06/03/15 0410  NA 141 144  K 3.3* 3.9  CL 102 101  CO2 33* 30  GLUCOSE 101* 37*  BUN 31* 30*  CREATININE 0.96 1.03*  CALCIUM 10.0 10.7*   CBC:  Recent Labs  05/31/15 1020  WBC 9.4  NEUTROABS 6.5  HGB 10.9*  HCT 31.6*  MCV 78.4  PLT 252   Current Meds: . antiseptic oral rinse  7 mL Mouth Rinse BID  . clopidogrel  75 mg Oral Q breakfast  . docusate sodium  100 mg Oral BID  . enoxaparin (LOVENOX) injection  40 mg Subcutaneous Q24H  . erythromycin  1 application Right Eye [id number]  . furosemide  40 mg Oral Daily  . guaiFENesin  600 mg Oral BID  . hydrALAZINE  50 mg Oral 3 times per day  . insulin aspart  0-24 Units Subcutaneous 6 times per day  . insulin detemir  48 Units Subcutaneous BID  . Linaclotide  145 mcg Oral Daily  . lisinopril  10 mg Oral Daily  . loratadine  10 mg Oral Daily  . memantine  28 mg Oral q morning - 10a  . metFORMIN  500 mg Oral Q breakfast  . nebivolol  10 mg Oral Daily  . pantoprazole  40 mg Oral Daily  . pneumococcal 23 valent vaccine  0.5 mL Intramuscular Tomorrow-1000  . polyethylene glycol  17 g Oral Daily  . rosuvastatin   20 mg Oral QHS     ASSESSMENT AND PLAN:  1. Severe aortic valve stenosis: POD #8 s/p TAVR. Continue Plavix.Will use Plavix alone without ASA given advanced age. Echo with normally functioning valve 05/27/15.   2. HTN: BP is controlled. Continue current meds.   3. Diabetes mellitus with circulatory complications: Blood sugars low on her home insulin regimen. Will lower insulin down to 30 units BID. Metformin has been restarted.   4. Acute diastolic CHF: She is net negative 5.5 liters since admission. Will continue Lasix 40 mg daily-home dosage  5. Constipation: Will restart home laxative and if no bowel movement by 2pm, will give lactulose  6. Dispo: Will discharge to SNF today. Plan discussed with daughter by phone. She will need follow up with me in 30 days with an echo (this appt has to be with me). She will need follow up with Dr. Cyndia Bent in the [place] surgery office in 1-2 weeks.   Katherine [name]  6/29/20167:11 AM

## 2015-06-03 NOTE — Progress Notes (Signed)
Physical Therapy Treatment Patient Details Name: Katherine [name] MRN: [id number] DOB: Nov 29, 1928 Today's Date: 06/03/2015    History of Present Illness Pt atmitted 6/21 for SOB and DOE. Pt found to have severe aortic stenosis. Pt with transcatheter aortic valve replacement -left transfemoral apprach on 6/21.    PT Comments    Pt progressing slowly towards physical therapy goals. Overall pt appeared more awake and engaged this session, however was not able to use RW for SPT to recliner as she did last session. Was still able to transition to the recliner, however pivotal steps were limited. Pt anticipates d/c to SNF today. Will continue to follow.  Follow Up Recommendations  SNF;Supervision/Assistance - 24 hour     Equipment Recommendations  None recommended by PT    Recommendations for Other Services       Precautions / Restrictions Precautions Precautions: Fall Restrictions Weight Bearing Restrictions: No    Mobility  Bed Mobility Overal bed mobility: Needs Assistance Bed Mobility: Supine to Sit     Supine to sit: Mod assist;+2 for physical assistance     General bed mobility comments: Pt required assist with LE's for initiating movement to EOB. +2 assist for trunk elevation and HHA required to achieve full sitting position.   Transfers Overall transfer level: Needs assistance Equipment used: Rolling walker (2 wheeled) Transfers: Sit to/from [facility] Sit to Stand: Mod assist;+2 physical assistance Stand pivot transfers: Mod assist;+2 physical assistance       General transfer comment: +2 assist provided to achieve full standing position. Once standing, pt was able to take shuffling pivotal steps around to the recliner chair, however required increased cueing for sequencing and technique. Followed commands well to reach back for the arm rests of chair for controlled descent to sitting.   Ambulation/Gait             General Gait  Details: Pt unable to tolerate standing activity at EOB long enough to attempt gait training this session.    Stairs            Wheelchair Mobility    Modified Rankin (Stroke Patients Only)       Balance Overall balance assessment: Needs assistance Sitting-balance support: Feet supported;No upper extremity supported Sitting balance-Leahy Scale: Poor Sitting balance - Comments: Assist to maintain static sitting at times   Standing balance support: Bilateral upper extremity supported Standing balance-Leahy Scale: Poor                      Cognition Arousal/Alertness: Awake/alert Behavior During Therapy: WFL for tasks assessed/performed Overall Cognitive Status: Impaired/Different from baseline Area of Impairment: Orientation;Attention;Memory;Problem solving Orientation Level: Disoriented to;Place;Time;Situation Current Attention Level: Focused Memory: Decreased short-term memory       Problem Solving: Slow processing;Difficulty sequencing;Requires verbal cues;Requires tactile cues      Exercises      General Comments        Pertinent Vitals/Pain Pain Assessment: No/denies pain    Home Living                      Prior Function            PT Goals (current goals can now be found in the care plan section) Acute Rehab PT Goals Patient Stated Goal: didn't state PT Goal Formulation: With patient/family Time For Goal Achievement: 06/03/15 Potential to Achieve Goals: Good Progress towards PT goals: Progressing toward goals    Frequency  Min 3X/week  PT Plan Current plan remains appropriate    Co-evaluation             End of Session Equipment Utilized During Treatment: Gait belt Activity Tolerance: Patient limited by fatigue Patient left: in chair;with call bell/phone within reach;with nursing/sitter in room;with family/visitor present     Time: [id number] PT Time Calculation (min) (ACUTE ONLY): 29 min  Charges:   $Therapeutic Activity: 23-37 mins                    G Codes:      Rolinda Roan 07-02-2015, 1:07 PM   Rolinda Roan, PT, DPT Acute Rehabilitation Services Pager: [phone]

## 2015-06-03 NOTE — Progress Notes (Signed)
Hypoglycemic Event  CBG: 29, 35  Treatment: 1 tube instant glucose, GRAPE JUICE, ORANGE JUICE WITH 6 PACKETS OF SUGAR, 1 CONTAINER OF PEANUT BUTTER  Symptoms: Sweaty  Follow-up CBG: Time: SEE CBG RESULTS CBG Result: 50, 62, 108  Possible Reasons for Event: Medication regimen: H.S. LEVEMIR  Comments/MD notified:CARDIOTHORACIC PATIENT    Katherine [name]  Remember to initiate Hypoglycemia Order Set & complete

## 2015-06-03 NOTE — Progress Notes (Signed)
      [address]       [place] [address]             [phone]        8 Days Post-Op Procedure(s) (LRB): TRANSCATHETER AORTIC VALVE REPLACEMENT, TRANSFEMORAL (N/A) TRANSESOPHAGEAL ECHOCARDIOGRAM (TEE) (N/A)  Subjective: Patient felt bad and weak earlier with hypoglycemia.  Objective: Vital signs in last 24 hours: Temp:  [98.3 F (36.8 C)-99.2 F (37.3 C)] 98.3 F (36.8 C) (06/29 0433) Pulse Rate:  [74-79] 76 (06/29 0500) Cardiac Rhythm:  [-] Normal sinus rhythm (06/29 0433) Resp:  [17-26] 26 (06/29 0433) BP: (134-175)/(46-60) 162/46 mmHg (06/29 0500) SpO2:  [96 %-98 %] 98 % (06/29 0441) Weight:  [222 lb 3.6 oz (100.8 kg)] 222 lb 3.6 oz (100.8 kg) (06/29 0500)  Pre op weight 106 kg Current Weight  06/03/15 222 lb 3.6 oz (100.8 kg)      Intake/Output from previous day: 06/28 0701 - 06/29 0700 In: 240 [P.O.:240] Out: 825 [Urine:825]   Physical Exam:  Cardiovascular: RRR Pulmonary: Mostly clear Abdomen: Soft, non tender, bowel sounds present. Extremities: Trace bilateral lower extremity edema. Wound: Clean and dry.  No erythema or signs of infection.  Lab Results: CBC:  Recent Labs  05/31/15 1020  WBC 9.4  HGB 10.9*  HCT 31.6*  PLT 252   BMET:   Recent Labs  06/02/15 0612 06/03/15 0410  NA 141 144  K 3.3* 3.9  CL 102 101  CO2 33* 30  GLUCOSE 101* 37*  BUN 31* 30*  CREATININE 0.96 1.03*  CALCIUM 10.0 10.7*    PT/INR:  Lab Results  Component Value Date   INR 1.17 05/26/2015   INR 1.01 05/22/2015   INR 1.0 03/17/2015   ABG:  INR: Will add last result for INR, ABG once components are confirmed Will add last 4 CBG results once components are confirmed  Assessment/Plan:  1. CV - SR in the 70's. Hypertensive. On Bystolic 10 mg daily, Lisinopril 10 mg daily, Hydralazine 50 mg tid, and Plavix 75 mg daily. 2.  Pulmonary - On room air. Mucinex for cough. 3. Volume Overload - Was on Lasix 80 IV bid. Now on Lasix 40 mg  daily. 4.  Acute blood loss anemia - Last H and H 10.9 and 31.6 5. DM-CBGs 50/62/108. On Insulin and Metformin. Insulin has been reduced secondary to hypoglycemia. Pre op HGA1C 8. Will need follow up with medical doctor after discharge. 6. Will need SNF when ready for discharge;management per cardiology  ZIMMERMAN,DONIELLE MPA-C 06/03/2015,7:39 AM

## 2015-06-03 NOTE — Progress Notes (Signed)
CBG NOW 50. GIVEN PEANUT BUTTER AND MORE JUICE

## 2015-06-03 NOTE — Progress Notes (Signed)
Report called to Adonis Huguenin, Therapist, sports at [facility]. Pt. Being transported by ambulance. Daughter with patient at this time.

## 2015-06-03 NOTE — Progress Notes (Signed)
Critically low glucose of 37 called by lab.  Issue already resolved (blood was drawn at same time as CBG assessments of 29 and 34 at approximately 0400).

## 2015-06-03 NOTE — Progress Notes (Signed)
CBG 29. RE-CHECKED AND FOUND TO BE 35.  PATIENT GIVEN TUBE OF GLUCOSE GEL BY MOUTH AND GRAPE JUICE.  M.D. NOTIFIED.

## 2015-06-03 NOTE — Discharge Instructions (Signed)
Daily Weights

## 2015-06-03 NOTE — Progress Notes (Signed)
Patient will discharge to [facility] Anticipated discharge date:06/03/15 Family notified:daugther at bedside Transportation by [facility]- scheduled for 2pm  CSW signing off.  Domenica Reamer, [place] Social Worker [phone]

## 2015-06-03 NOTE — Clinical Social Work Placement (Signed)
   CLINICAL SOCIAL WORK PLACEMENT  NOTE  Date:  06/03/2015  Patient Details  Name: Katherine [name] MRN: [id number] Date of Birth: Jul 12, 1928  Clinical Social Work is seeking post-discharge placement for this patient at the [place] level of care (*CSW will initial, date and re-position this form in  chart as items are completed):  Yes   Patient/family provided with [facility] Work Department's list of facilities offering this level of care within the geographic area requested by the patient (or if unable, by the patient's family).  Yes   Patient/family informed of their freedom to choose among providers that offer the needed level of care, that participate in Medicare, Medicaid or managed care program needed by the patient, have an available bed and are willing to accept the patient.  Yes   Patient/family informed of [facility]'s ownership interest in [facility] and [facility], as well as of the fact that they are under no obligation to receive care at these facilities.  PASRR submitted to EDS on 05/31/15     PASRR number received on 05/31/15     Existing PASRR number confirmed on       FL2 transmitted to all facilities in geographic area requested by pt/family on 05/31/15     FL2 transmitted to all facilities within larger geographic area on       Patient informed that his/her managed care company has contracts with or will negotiate with certain facilities, including the following:        Yes   Patient/family informed of bed offers received.  Patient chooses bed at [facility]     Physician recommends and patient chooses bed at      Patient to be transferred to [facility] on 06/03/15.  Patient to be transferred to facility by ptar     Patient family notified on 06/03/15 of transfer.  Name of family member notified:        PHYSICIAN Please sign FL2     Additional Comment:     _______________________________________________ Cranford Mon, LCSW 06/03/2015, 1:28 PM

## 2015-06-04 ENCOUNTER — Encounter: Payer: Self-pay | Admitting: Adult Health

## 2015-06-04 ENCOUNTER — Non-Acute Institutional Stay (SKILLED_NURSING_FACILITY): Payer: Medicare Other | Admitting: Adult Health

## 2015-06-04 DIAGNOSIS — I5030 Unspecified diastolic (congestive) heart failure: Secondary | ICD-10-CM | POA: Diagnosis not present

## 2015-06-04 DIAGNOSIS — E785 Hyperlipidemia, unspecified: Secondary | ICD-10-CM

## 2015-06-04 DIAGNOSIS — D62 Acute posthemorrhagic anemia: Secondary | ICD-10-CM

## 2015-06-04 DIAGNOSIS — I10 Essential (primary) hypertension: Secondary | ICD-10-CM | POA: Diagnosis not present

## 2015-06-04 DIAGNOSIS — E876 Hypokalemia: Secondary | ICD-10-CM

## 2015-06-04 DIAGNOSIS — E1165 Type 2 diabetes mellitus with hyperglycemia: Secondary | ICD-10-CM | POA: Diagnosis not present

## 2015-06-04 DIAGNOSIS — F411 Generalized anxiety disorder: Secondary | ICD-10-CM

## 2015-06-04 DIAGNOSIS — J810 Acute pulmonary edema: Secondary | ICD-10-CM | POA: Insufficient documentation

## 2015-06-04 DIAGNOSIS — K5900 Constipation, unspecified: Secondary | ICD-10-CM

## 2015-06-04 DIAGNOSIS — R5381 Other malaise: Secondary | ICD-10-CM | POA: Diagnosis not present

## 2015-06-04 DIAGNOSIS — IMO0002 Reserved for concepts with insufficient information to code with codable children: Secondary | ICD-10-CM

## 2015-06-04 DIAGNOSIS — F0390 Unspecified dementia without behavioral disturbance: Secondary | ICD-10-CM | POA: Diagnosis not present

## 2015-06-04 DIAGNOSIS — K219 Gastro-esophageal reflux disease without esophagitis: Secondary | ICD-10-CM

## 2015-06-04 DIAGNOSIS — I350 Nonrheumatic aortic (valve) stenosis: Secondary | ICD-10-CM | POA: Diagnosis not present

## 2015-06-04 DIAGNOSIS — H00013 Hordeolum externum right eye, unspecified eyelid: Secondary | ICD-10-CM

## 2015-06-04 LAB — GLUCOSE, CAPILLARY: Glucose-Capillary: 29 mg/dL — CL (ref 65–99)

## 2015-06-04 NOTE — Progress Notes (Signed)
Patient ID: Katherine [name], female   DOB: 22-Feb-1928, 81 y.o.   MRN: [id number]   06/04/2015  Facility:  [facility]urs[facility] Home Location:  [place] Room Number: 907-P LEVEL OF CARE:  SNF (31)   Chief Complaint  Patient presents with  . Hospitalization Follow-up    Physical deconditioning, severe aortic stenosis S/P TAVR, anxiety, constipation, hypertension, diabetes mellitus, dementia, GERD, hypokalemia, hyperlipidemia, diastolic CHF, stye and anemia     HISTORY OF PRESENT ILLNESS:  This is an 81 year old female was being admitted to [facility] on 06/03/15 from [facility]. She has PMH of severe aortic stenosis, hypertension, diabetes mellitus,, hyperlipidemia and GERD. She has severe aortic stenosis. Her symptoms include marked fatigue and dyspnea on exertion. She had TAVR on 05/26/15.  She has been admitted for a short-term rehabilitation.  PAST MEDICAL HISTORY:  Past Medical History  Diagnosis Date  . Gastric mass   . Hernia of unspecified site of abdominal cavity without mention of obstruction or gangrene     hiatal  . Gastric ulcer   . Thyroid mass   . GERD (gastroesophageal reflux disease)   . Anxiety   . Coronary artery disease   . Osteoarthritis   . Anemia   . Hypertension   . Hyperlipidemia   . Diabetes mellitus, type 2   . Aortic valvular stenosis     moderate to severe  EF on 2D Echo 10/22/2012 65-70%  valve area of 1.2cm, and peak and mean gradients of 45 and 41mmHg  . CHF (congestive heart failure)   . Dyspnea     chronic  . Easy fatigability   . Normal cardiac stress test 12/2013    normal stress nuclear study  . Heart murmur   . Chalazion of right upper eyelid early June 2016    treating with cream, Rx by Dr. Merlyn Albert      CURRENT MEDICATIONS: Reviewed per MAR/see medication list  No Known Allergies   REVIEW OF SYSTEMS:  GENERAL: no change in appetite, no fatigue, no weight changes, no fever, chills or  weakness RESPIRATORY: no cough, SOB, DOE, wheezing, hemoptysis CARDIAC: no chest pain, or palpitations GI: no abdominal pain, diarrhea, heart burn, nausea or vomiting, +constipation  PHYSICAL EXAMINATION  GENERAL: no acute distress, obese SKIN:  Left inguinal area surgical incision glued, dry, no erythema EYES: conjunctivae normal, sclerae normal, right eyelid has a stye NECK: supple, trachea midline, no neck masses, no thyroid tenderness, no thyromegaly LYMPHATICS: no LAN in the neck, no supraclavicular LAN RESPIRATORY: breathing is even & unlabored, BS CTAB CARDIAC: RRR, no murmur,no extra heart sounds, BLE edema trace edema GI: abdomen soft, normal BS, no masses, no tenderness, no hepatomegaly, no splenomegaly EXTREMITIES:  Able to move 4 extremities PSYCHIATRIC: the patient is alert & oriented to person, affect & behavior appropriate  LABS/RADIOLOGY: Labs reviewed: Basic Metabolic Panel:  Recent Labs  05/27/15 0400 05/27/15 1349 05/28/15 0315  06/01/15 1203 06/02/15 0612 06/03/15 0410  NA 137  --  137  < > 139 141 144  K 3.6  --  3.6  < > 3.7 3.3* 3.9  CL 104  --  103  < > 98* 102 101  CO2 26  --  27  < > 32 33* 30  GLUCOSE 140*  --  153*  < > 201* 101* 37*  BUN 9  --  9  < > 30* 31* 30*  CREATININE 0.76  --  0.73  < >  0.95 0.96 1.03*  CALCIUM 9.4  --  9.9  < > 10.2 10.0 10.7*  MG 1.1* 1.7 1.7  --   --   --   --   < > = values in this interval not displayed. Liver Function Tests:  Recent Labs  09/07/14 1214 05/22/15 1310  AST 14 17  ALT 12 14  ALKPHOS 90 84  BILITOT 0.3 0.4  PROT 7.5 7.5  ALBUMIN 3.6 3.4*   CBC:  Recent Labs  11/21/14 1501  05/28/15 0315 05/30/15 0428 05/31/15 1020  WBC 8.3  < > 12.3* 12.2* 9.4  NEUTROABS 5.7  --   --   --  6.5  HGB 12.4  < > 11.3* 12.0 10.9*  HCT 35.4*  < > 32.1* 34.2* 31.6*  MCV 80.3  < > 77.5* 77.4* 78.4  PLT 306  < > 202 244 252  < > = values in this interval not displayed.  Cardiac Enzymes:  Recent  Labs  11/21/14 1501  TROPONINI <0.30   CBG:  Recent Labs  06/03/15 0517 06/03/15 0757 06/03/15 1129  GLUCAP 108* 118* 134*    Dg Chest 1 View  05/30/2015   CLINICAL DATA:  Shortness of breath. Chest x-ray following swallowing study. History of CAD, hypertension, CHF, diabetes.  EXAM: CHEST  1 VIEW  COMPARISON:  05/30/2015  FINDINGS: Status post median sternotomy and CABG. The heart is enlarged. Shallow lung inflation. No pulmonary edema. There is patchy density at the medial right lung base, similar in appearance to the previous exam. Minimal streaky densities at the left lung base also appear stable.  IMPRESSION: 1. Cardiomegaly. 2. Bibasilar opacities, stable in appearance.   Electronically Signed   By: Nolon Nations M.D.   On: 05/30/2015 14:02   Dg Chest 2 View  05/31/2015   CLINICAL DATA:  Shortness of breath that began last night  EXAM: CHEST  2 VIEW  COMPARISON:  05/30/2015  FINDINGS: There is marked cardiac enlargement. The patient is status post median sternotomy and CABG procedure. There is diffuse pulmonary vascular congestion. Mild pleural fluid noted. Atelectasis noted in the lung bases.  IMPRESSION: 1. Mild CHF.   Electronically Signed   By: Kerby Moors M.D.   On: 05/31/2015 13:05   Dg Chest 2 View  05/22/2015   CLINICAL DATA:  Transcatheter aortic valve replacement, aortic stenosis  EXAM: CHEST  2 VIEW  COMPARISON:  01/06/2015  FINDINGS: Cardiomegaly is noted. Again noted status post CABG. No acute infiltrate or pulmonary edema. Mild right basilar atelectasis or scarring. Atherosclerotic calcifications of thoracic aorta. Thoracic spine osteopenia.  IMPRESSION: No acute infiltrate or pulmonary edema. Status post CABG. Mild right basilar atelectasis or scarring.   Electronically Signed   By: Lahoma Crocker M.D.   On: 05/22/2015 15:36   Dg Chest Port 1 View  05/30/2015   CLINICAL DATA:  Acute onset of shortness of breath. Initial encounter.  EXAM: PORTABLE CHEST - 1 VIEW   COMPARISON:  Chest radiograph performed 05/27/2015  FINDINGS: The lungs are hypoexpanded. Vascular congestion is noted. Mild bibasilar opacities may reflect atelectasis or mild pneumonia. No pleural effusion or pneumothorax is seen.  The cardiomediastinal silhouette is enlarged. The patient is status post median sternotomy, with evidence of prior CABG. No acute osseous abnormalities are seen.  IMPRESSION: Lungs hypoexpanded. Vascular congestion and cardiomegaly. Mild bibasilar opacities may reflect atelectasis or mild pneumonia.   Electronically Signed   By: Garald Balding M.D.   On: 05/30/2015 05:00  Dg Chest Port 1 View  05/27/2015   CLINICAL DATA:  Post transcatheter aortic valve replacement, severe aortic stenosis  EXAM: PORTABLE CHEST - 1 VIEW  COMPARISON:  Portable chest x-ray of 05/26/2015  FINDINGS: There is little change in overall aeration with basilar atelectasis right greater than left. Cardiomegaly and mild pulmonary vascular congestion remain. The tip of the Swan-Ganz catheter remains in the pulmonary artery to the right lower lobe. No pneumothorax is seen.  IMPRESSION: 1. Little change in poor aeration with basilar atelectasis right greater than left. 2. Cardiomegaly.  Question mild pulmonary vascular congestion.   Electronically Signed   By: Ivar Drape M.D.   On: 05/27/2015 08:04   Dg Chest Port 1 View  05/26/2015   CLINICAL DATA:  Transcatheter aortic valve replacement. Aortic valve stenosis  EXAM: PORTABLE CHEST - 1 VIEW  COMPARISON:  May 22, 2015  FINDINGS: There is now a prosthetic aortic valve present. Swan-Ganz catheter tip is in the distal right main pulmonary artery. No pneumothorax. There is no edema or consolidation. Heart is mildly enlarged with pulmonary vascular within normal limits. Patient is also status post coronary artery bypass grafting. No adenopathy.  IMPRESSION: Swan-Ganz catheter tip and distal right main pulmonary artery. No pneumothorax. Stable cardiomegaly. No edema  or consolidation. Prosthetic aortic valve present.   Electronically Signed   By: Lowella Grip III M.D.   On: 05/26/2015 11:40   Dg Swallowing Func-speech Pathology  05/30/2015    Objective Swallowing Evaluation:    Patient Details  Name: INAYA KIGHT MRN: [id number] Date of Birth: 1928/03/08  Today's Date: 05/30/2015 Time: SLP Start Time (ACUTE ONLY): 1045-SLP Stop Time (ACUTE ONLY): 1110 SLP Time Calculation (min) (ACUTE ONLY): 25 min  Past Medical History:  Past Medical History  Diagnosis Date  . Gastric mass   . Hernia of unspecified site of abdominal cavity without mention of  obstruction or gangrene     hiatal  . Gastric ulcer   . Thyroid mass   . GERD (gastroesophageal reflux disease)   . Anxiety   . Coronary artery disease   . Osteoarthritis   . Anemia   . Hypertension   . Hyperlipidemia   . Diabetes mellitus, type 2   . Aortic valvular stenosis     moderate to severe  EF on 2D Echo 10/22/2012 65-70%  valve area of  1.2cm, and peak and mean gradients of 45 and 21mmHg  . CHF (congestive heart failure)   . Dyspnea     chronic  . Easy fatigability   . Normal cardiac stress test 12/2013    normal stress nuclear study  . Heart murmur   . Chalazion of right upper eyelid early June 2016    treating with cream, Rx by Dr. Merlyn Albert     Past Surgical History:  Past Surgical History  Procedure Laterality Date  . Coronary artery bypass graft      x4 in 2003  . Tubal ligation    . Knee surgery Right   . Uterine polypectomy    . Internal hemorrhoidectomy    . Cataract extraction Bilateral   . Left and right heart catheterization with coronary/graft angiogram N/A  03/24/2015    Procedure: LEFT AND RIGHT HEART CATHETERIZATION WITH Beatrix Fetters;  Surgeon: Burnell Blanks, MD;  Location: [facility] CATH LAB;   Service: Cardiovascular;  Laterality: N/A;  . Transcatheter aortic valve replacement, transfemoral N/A 05/26/2015    Procedure: TRANSCATHETER AORTIC VALVE REPLACEMENT, TRANSFEMORAL;  Surgeon:  Burnell Blanks, MD;  Location: [place];  Service: Open  Heart Surgery;  Laterality: N/A;  . Tee without cardioversion N/A 05/26/2015    Procedure: TRANSESOPHAGEAL ECHOCARDIOGRAM (TEE);  Surgeon: Burnell Blanks, MD;  Location: [place];  Service: Open Heart Surgery;  Laterality:  N/A;   HPI:  Other Pertinent Information: The patient, an 81 year old female, was  admitted to the hospital due to severe aortic stenosis for a transcatheter  aortic valve replacement.  Nursing reports changes in the patient's  respiratory status and concerns for aspiration.  The patient was made NPO  this AM.  Most current chest x-ray from 05/30/2015 showed vascular  congestion, cardiolegaly and bibasilar opacities with possible mild PNA.     No Data Recorded  Clinical Impressions: Pt presenting with mild oral and mild to moderate  pharyngeal dysphagia. Pt with delay in swallow initiation with thin  liquids at the level of the pyriform sinuses. Delay in swallow initiation  triggered at the valleculae with all other consistencies including puree,  nectar thick liquids, and regular consistencies. Silent penetration of  thin liquids evidenced when the patient used a straw. This did not pass  below the level of the true vocal cords, however volitional cough did not  clear the penetrates. No penetration or aspiration of thin liquids were  seen when the patient used a cup or teaspoon. Pt with mild pyriform sinus  residue secondary to reduced laryngeal elevation. Recommend regular diet  with thin liquids. NO straw, medicines whole with puree. ST to follow up  for diet tolerance and aspiration precaution education.  Assessment / Plan / Recommendation No flowsheet data found.    No flowsheet data found.   CHL IP DIET RECOMMENDATION 05/30/2015  SLP Diet Recommendations Age appropriate regular solids;Thin  Liquid Administration via Cup, spoon, NO Straw  Medication Administration Whole with puree  Compensations   Postural Changes and/or  Swallow Maneuvers (None)     CHL IP OTHER RECOMMENDATIONS 05/30/2015  Recommended Consults (None)  Oral Care Recommendations Oral care BID  Other Recommendations (None)     No flowsheet data found.   No flowsheet data found.   Pertinent Vitals/Pain     SLP Swallow Goals No flowsheet data found.  No flowsheet data found.    No flowsheet data found.          No flowsheet data found.  No flowsheet data found.  Arvil Chaco MA, CCC-SLP Acute Care Speech Language Pathologist          Levi Aland 05/30/2015, 2:10 PM     ASSESSMENT/PLAN:  Physical deconditioning - for rehabilitation  Severe aortic stenosis S/P TAVR - continue Plavix 75 mg 1 tab by mouth daily, Tylenol 500 mg 2 tabs = 1000 mg by mouth twice a day and tramadol 50 mg 1-2 tabs by mouth every 4 hours when necessary for pain  Anxiety - mood is stable; continue Xanax 0.25 mg take 1/2 tab = 0.125 mg by mouth twice a day when necessary  Constipation - discontinue Colace, start senna S2 tabs by mouth twice a day, continue lactulose 30 g/45 ML by mouth daily when necessary, MiraLAX 17 g by mouth daily and Linzess 145 g 1 capsule by mouth daily when necessary  Diastolic CHF - continue Lasix 40 mg 1 tab by mouth daily; weight Q Mondays and Wednesdays  Hypertension - continue hydralazine 50 mg 1 tab by mouth every 8 hours, lisinopril 10 mg 1 tab by  mouth daily and Bystolic 10 mg 1 tab by mouth every morning  Diabetes mellitus, type II - hemoglobin A1c 8.0; insulin dosage was recently decreased; continue Levemir 30 units subcutaneous twice a day and Glucophage XR 500 mg 1 tab by mouth daily  Dementia - continue Namenda XR 28 mg 1 capsule by mouth every morning  GERD - continue Nexium 20 mg 1 capsule by mouth daily  Hypokalemia - K 3.9; continue K Dur 20 meq by mouth daily  Hyperlipidemia - continue Crestor 20 mg 1 tab by mouth daily at bedtime  Anemia, acute blood loss - hemoglobin 10.9; will monitor  Stye on right eyelid - continue  erythromycin ophthalmic ointment to right eye every 3 hours while awake 5 days    Goals of care:  Short-term rehabilitation   Labs/test ordered:  CBC and BMP  Spent 50 minutes in patient care.    [facility], NP [employer] [phone]

## 2015-06-05 ENCOUNTER — Non-Acute Institutional Stay (SKILLED_NURSING_FACILITY): Payer: Medicare Other | Admitting: Internal Medicine

## 2015-06-05 DIAGNOSIS — K219 Gastro-esophageal reflux disease without esophagitis: Secondary | ICD-10-CM | POA: Diagnosis not present

## 2015-06-05 DIAGNOSIS — I350 Nonrheumatic aortic (valve) stenosis: Secondary | ICD-10-CM

## 2015-06-05 DIAGNOSIS — I10 Essential (primary) hypertension: Secondary | ICD-10-CM

## 2015-06-05 DIAGNOSIS — K5900 Constipation, unspecified: Secondary | ICD-10-CM

## 2015-06-05 DIAGNOSIS — R5381 Other malaise: Secondary | ICD-10-CM | POA: Diagnosis not present

## 2015-06-05 DIAGNOSIS — IMO0002 Reserved for concepts with insufficient information to code with codable children: Secondary | ICD-10-CM

## 2015-06-05 DIAGNOSIS — F0390 Unspecified dementia without behavioral disturbance: Secondary | ICD-10-CM | POA: Diagnosis not present

## 2015-06-05 DIAGNOSIS — I5031 Acute diastolic (congestive) heart failure: Secondary | ICD-10-CM

## 2015-06-05 DIAGNOSIS — E785 Hyperlipidemia, unspecified: Secondary | ICD-10-CM

## 2015-06-05 DIAGNOSIS — E1165 Type 2 diabetes mellitus with hyperglycemia: Secondary | ICD-10-CM

## 2015-06-05 DIAGNOSIS — F411 Generalized anxiety disorder: Secondary | ICD-10-CM | POA: Diagnosis not present

## 2015-06-05 DIAGNOSIS — D62 Acute posthemorrhagic anemia: Secondary | ICD-10-CM

## 2015-06-05 LAB — BASIC METABOLIC PANEL
BUN: 32 mg/dL — AB (ref 4–21)
CREATININE: 1 mg/dL (ref 0.5–1.1)
Glucose: 110 mg/dL
Potassium: 3.7 mmol/L (ref 3.4–5.3)
Sodium: 145 mmol/L (ref 137–147)

## 2015-06-05 LAB — HEPATIC FUNCTION PANEL
ALT: 16 U/L (ref 7–35)
AST: 18 U/L (ref 13–35)
Alkaline Phosphatase: 65 U/L (ref 25–125)
BILIRUBIN, TOTAL: 0.7 mg/dL

## 2015-06-05 LAB — CBC AND DIFFERENTIAL
HCT: 32 % — AB (ref 36–46)
Hemoglobin: 10.6 g/dL — AB (ref 12.0–16.0)
PLATELETS: 88 10*3/uL — AB (ref 150–399)
WBC: 8.4 10^3/mL

## 2015-06-05 NOTE — Progress Notes (Signed)
Patient ID: Katherine [name], female   DOB: 11-12-1928, 81 y.o.   MRN: [id number]     [facility]   PCP: Delphina Cahill, MD  Code Status: full code  No Known Allergies  Chief Complaint  Patient presents with  . New Admit To SNF     HPI:  81 year old patient is here for short term rehabilitation post hospital admission from 05/26/15-06/03/15 with severe aortic valve stenosis. She underwent TAVR on 05/26/15. She had acute pulmonary edema and responded well to diuresis in the hospital. She has PMH of HTN, CHF, diabetes mellitus,, hyperlipidemia and GERD. She is seen in her room today. She has generalized weakness. She has been working with therapy team. She has not had a bowel movement in few days and was started on bowel regimen.   Review of Systems:  Constitutional: Negative for fever, chills, diaphoresis.  HENT: Negative for headache, congestion, nasal discharge Eyes: Negative for eye pain, blurred vision, double vision and discharge.  Respiratory: Negative for cough, shortness of breath and wheezing.   Cardiovascular: Negative for chest pain, palpitations, leg swelling.  Gastrointestinal: Negative for heartburn, nausea, vomiting, abdominal pain Genitourinary: Negative for dysuria, flank pain.  Musculoskeletal: Negative for back pain, falls Skin: Negative for itching, rash.  Neurological: Negative for dizziness, tingling, focal weakness Psychiatric/Behavioral: Negative for depression   Past Medical History  Diagnosis Date  . Gastric mass   . Hernia of unspecified site of abdominal cavity without mention of obstruction or gangrene     hiatal  . Gastric ulcer   . Thyroid mass   . GERD (gastroesophageal reflux disease)   . Anxiety   . Coronary artery disease   . Osteoarthritis   . Anemia   . Hypertension   . Hyperlipidemia   . Diabetes mellitus, type 2   . Aortic valvular stenosis     moderate to severe  EF on 2D Echo 10/22/2012 65-70%   valve area of 1.2cm, and peak and mean gradients of 45 and 22mmHg  . CHF (congestive heart failure)   . Dyspnea     chronic  . Easy fatigability   . Normal cardiac stress test 12/2013    normal stress nuclear study  . Heart murmur   . Chalazion of right upper eyelid early June 2016    treating with cream, Rx by Dr. Merlyn Albert     Past Surgical History  Procedure Laterality Date  . Coronary artery bypass graft      x4 in 2003  . Tubal ligation    . Knee surgery Right   . Uterine polypectomy    . Internal hemorrhoidectomy    . Cataract extraction Bilateral   . Left and right heart catheterization with coronary/graft angiogram N/A 03/24/2015    Procedure: LEFT AND RIGHT HEART CATHETERIZATION WITH Beatrix Fetters;  Surgeon: Burnell Blanks, MD;  Location: [facility] CATH LAB;  Service: Cardiovascular;  Laterality: N/A;  . Transcatheter aortic valve replacement, transfemoral N/A 05/26/2015    Procedure: TRANSCATHETER AORTIC VALVE REPLACEMENT, TRANSFEMORAL;  Surgeon: Burnell Blanks, MD;  Location: [place];  Service: Open Heart Surgery;  Laterality: N/A;  . Tee without cardioversion N/A 05/26/2015    Procedure: TRANSESOPHAGEAL ECHOCARDIOGRAM (TEE);  Surgeon: Burnell Blanks, MD;  Location: [place];  Service: Open Heart Surgery;  Laterality: N/A;   Social History:   reports that she has never smoked. She has never used smokeless tobacco. She reports that she does not drink alcohol  or use illicit drugs.  Family History  Problem Relation Age of Onset  . Colon cancer Neg Hx   . Diabetes Mother   . Heart disease Mother   . Stroke Father   . Stroke Mother   . Heart attack Brother   . Cerebral aneurysm Mother     Medications: Patient's Medications  New Prescriptions   No medications on file  Previous Medications   ACETAMINOPHEN (TYLENOL) 500 MG TABLET    Take 1,000 mg by mouth 2 (two) times daily. Take 1000 mg by mouth in the morning and take 1000 mg by mouth in the  evening.   ALPRAZOLAM (XANAX) 0.25 MG TABLET    Take 0.5 tablets (0.125 mg total) by mouth 2 (two) times daily as needed for anxiety or sleep.   CLOPIDOGREL (PLAVIX) 75 MG TABLET    Take 1 tablet (75 mg total) by mouth daily with breakfast.   DOCUSATE SODIUM (COLACE) 100 MG CAPSULE    Take 1 capsule (100 mg total) by mouth 2 (two) times daily.   ERYTHROMYCIN OPHTHALMIC OINTMENT    Place 1 application into the right eye every 3 (three) hours.   FEXOFENADINE (ALLEGRA) 180 MG TABLET    Take 180 mg by mouth daily.   FUROSEMIDE (LASIX) 40 MG TABLET    Take 1 tablet (40 mg total) by mouth daily.   GUAIFENESIN (MUCINEX) 600 MG 12 HR TABLET    Take 1 tablet (600 mg total) by mouth 2 (two) times daily.   HYDRALAZINE (APRESOLINE) 50 MG TABLET    Take 1 tablet (50 mg total) by mouth every 8 (eight) hours.   INSULIN DETEMIR (LEVEMIR) 100 UNIT/ML INJECTION    Inject 0.3 mLs (30 Units total) into the skin 2 (two) times daily.   IPRATROPIUM-ALBUTEROL (COMBIVENT RESPIMAT) 20-100 MCG/ACT AERS RESPIMAT    Inhale 2 puffs into the lungs every 6 (six) hours as needed for wheezing or shortness of breath.   LACTULOSE (CHRONULAC) 10 GM/15ML SOLUTION    Take 45 mLs (30 g total) by mouth daily as needed for mild constipation or severe constipation.   LINACLOTIDE (LINZESS) 145 MCG CAPS CAPSULE    Take 145 mcg by mouth daily as needed (for constipation).   LISINOPRIL (PRINIVIL,ZESTRIL) 10 MG TABLET    Take 1 tablet (10 mg total) by mouth daily.   LOPERAMIDE (IMODIUM) 2 MG CAPSULE    Take 2 mg by mouth as needed for diarrhea or loose stools.   MEMANTINE HCL ER (NAMENDA XR) 28 MG CP24    Take 28 mg by mouth every morning.   METFORMIN (GLUCOPHAGE-XR) 500 MG 24 HR TABLET    Take 500 mg by mouth daily with breakfast.    NEBIVOLOL (BYSTOLIC) 10 MG TABLET    Take 10 mg by mouth every morning.   NEXIUM 20 MG CAPSULE    Take 20 mg by mouth daily.    NON FORMULARY    Apply 1-2 application topically 4 (four) times daily. DIC 3 % / BAC  2 % / GAB 5 % / LID-PRIL 2.5 % - 2.5 % / MENT 1 %   POLYETHYLENE GLYCOL (MIRALAX / GLYCOLAX) PACKET    Take 17 g by mouth daily.   POTASSIUM CHLORIDE SA (K-DUR,KLOR-CON) 20 MEQ TABLET    Take 20 mEq by mouth daily.   ROSUVASTATIN (CRESTOR) 20 MG TABLET    Take 20 mg by mouth at bedtime.    TRAMADOL (ULTRAM) 50 MG TABLET    Take 1-2  tablets (50-100 mg total) by mouth every 4 (four) hours as needed for moderate pain.   VOLTAREN 1 % GEL    Apply 2 g topically daily as needed (pain).   Modified Medications   No medications on file  Discontinued Medications   No medications on file     Physical Exam: Filed Vitals:   06/05/15 1652  BP: 148/64  Pulse: 70  Temp: 99.6 F (37.6 C)  Resp: 16  SpO2: 95%    General- elderly female, obese, in no acute distress Head- normocephalic, atraumatic Throat- moist mucus membrane Eyes- no pallor, no icterus, no discharge, normal conjunctiva, normal sclera Neck- no cervical lymphadenopathy, no jugular vein distension Cardiovascular- normal s1,s2, no murmurs, palpable dorsalis pedis and radial pulses, no leg edema Respiratory- bilateral clear to auscultation, no wheeze, no rhonchi, no crackles, no use of accessory muscles Abdomen- bowel sounds present, soft, non tender Musculoskeletal- able to move all 4 extremities, generalized weakness  Neurological- no focal deficit Skin- warm and dry, left groin incision healing well Psychiatry- alert and oriented   Labs reviewed: Basic Metabolic Panel:  Recent Labs  05/27/15 0400 05/27/15 1349 05/28/15 0315  06/01/15 1203 06/02/15 0612 06/03/15 0410  NA 137  --  137  < > 139 141 144  K 3.6  --  3.6  < > 3.7 3.3* 3.9  CL 104  --  103  < > 98* 102 101  CO2 26  --  27  < > 32 33* 30  GLUCOSE 140*  --  153*  < > 201* 101* 37*  BUN 9  --  9  < > 30* 31* 30*  CREATININE 0.76  --  0.73  < > 0.95 0.96 1.03*  CALCIUM 9.4  --  9.9  < > 10.2 10.0 10.7*  MG 1.1* 1.7 1.7  --   --   --   --   < > = values in  this interval not displayed. Liver Function Tests:  Recent Labs  09/07/14 1214 05/22/15 1310  AST 14 17  ALT 12 14  ALKPHOS 90 84  BILITOT 0.3 0.4  PROT 7.5 7.5  ALBUMIN 3.6 3.4*   No results for input(s): LIPASE, AMYLASE in the last 8760 hours. No results for input(s): AMMONIA in the last 8760 hours. CBC:  Recent Labs  11/21/14 1501  05/28/15 0315 05/30/15 0428 05/31/15 1020  WBC 8.3  < > 12.3* 12.2* 9.4  NEUTROABS 5.7  --   --   --  6.5  HGB 12.4  < > 11.3* 12.0 10.9*  HCT 35.4*  < > 32.1* 34.2* 31.6*  MCV 80.3  < > 77.5* 77.4* 78.4  PLT 306  < > 202 244 252  < > = values in this interval not displayed. Cardiac Enzymes:  Recent Labs  11/21/14 1501  TROPONINI <0.30   BNP: Invalid input(s): POCBNP CBG:  Recent Labs  06/03/15 0517 06/03/15 0757 06/03/15 1129  GLUCAP 108* 118* 134*    Radiological Exams:  05/27/2015 Conclusions - Left ventricle: Small hyperdynamic ventricle with no SAM but   small LVOT gradient. The cavity size was normal. There was   moderate concentric hypertrophy. Systolic function was vigorous.   The estimated ejection fraction was in the range of 65% to 70%. - Aortic valve: Normal appearing 23 mm Sapien 3 stented valve   No significant peri valvular regurgitations   Mean gradient 14 mm Hg a bit higher than OR   may be related to  hyperdynamic function   Valve area (VTI): 1.63 cm^2. Valve area (Vmax): 1.39 cm^2. Valve   area (Vmean): 1.55 cm^2. - Mitral valve: Calcified annulus. There was mild regurgitation.   Valve area by continuity equation (using LVOT flow): 1.82 cm^2. - Left atrium: The atrium was moderately dilated. - Right atrium: The atrium was mildly dilated. - Atrial septum: No defect or patent foramen ovale was identified. - Tricuspid valve: There was moderate regurgitation. - Pulmonary arteries: PA peak pressure: 53 mm Hg (S). - Pericardium, extracardiac: A trivial pericardial effusion was   identified posterior to  the heart. Dg Chest 1 View 05/30/2015   CLINICAL DATA:  Shortness of breath. Chest x-ray following swallowing study. History of CAD, hypertension, CHF, diabetes.  EXAM: CHEST  1 VIEW  COMPARISON:  05/30/2015  FINDINGS: Status post median sternotomy and CABG. The heart is enlarged. Shallow lung inflation. No pulmonary edema. There is patchy density at the medial right lung base, similar in appearance to the previous exam. Minimal streaky densities at the left lung base also appear stable.  IMPRESSION: 1. Cardiomegaly. 2. Bibasilar opacities, stable in appearance.   Electronically Signed   By: Nolon Nations M.D.   On: 05/30/2015 14:02   Dg Chest 2 View 05/31/2015   CLINICAL DATA:  Shortness of breath that began last night  EXAM: CHEST  2 VIEW  COMPARISON:  05/30/2015  FINDINGS: There is marked cardiac enlargement. The patient is status post median sternotomy and CABG procedure. There is diffuse pulmonary vascular congestion. Mild pleural fluid noted. Atelectasis noted in the lung bases.  IMPRESSION: 1. Mild CHF.   Electronically Signed   By: Kerby Moors M.D.   On: 05/31/2015 13:05   Dg Chest 2 View 05/22/2015   CLINICAL DATA:  Transcatheter aortic valve replacement, aortic stenosis  EXAM: CHEST  2 VIEW  COMPARISON:  01/06/2015  FINDINGS: Cardiomegaly is noted. Again noted status post CABG. No acute infiltrate or pulmonary edema. Mild right basilar atelectasis or scarring. Atherosclerotic calcifications of thoracic aorta. Thoracic spine osteopenia.  IMPRESSION: No acute infiltrate or pulmonary edema. Status post CABG. Mild right basilar atelectasis or scarring.   Electronically Signed   By: Lahoma Crocker M.D.   On: 05/22/2015 15:36   Dg Chest Port 1 View 05/30/2015   CLINICAL DATA:  Acute onset of shortness of breath. Initial encounter.  EXAM: PORTABLE CHEST - 1 VIEW  COMPARISON:  Chest radiograph performed 05/27/2015  FINDINGS: The lungs are hypoexpanded. Vascular congestion is noted. Mild bibasilar  opacities may reflect atelectasis or mild pneumonia. No pleural effusion or pneumothorax is seen.  The cardiomediastinal silhouette is enlarged. The patient is status post median sternotomy, with evidence of prior CABG. No acute osseous abnormalities are seen.  IMPRESSION: Lungs hypoexpanded. Vascular congestion and cardiomegaly. Mild bibasilar opacities may reflect atelectasis or mild pneumonia.   Electronically Signed   By: Garald Balding M.D.   On: 05/30/2015 05:00   Dg Chest Port 1 View 05/27/2015   CLINICAL DATA:  Post transcatheter aortic valve replacement, severe aortic stenosis  EXAM: PORTABLE CHEST - 1 VIEW  COMPARISON:  Portable chest x-ray of 05/26/2015  FINDINGS: There is little change in overall aeration with basilar atelectasis right greater than left. Cardiomegaly and mild pulmonary vascular congestion remain. The tip of the Swan-Ganz catheter remains in the pulmonary artery to the right lower lobe. No pneumothorax is seen.  IMPRESSION: 1. Little change in poor aeration with basilar atelectasis right greater than left. 2. Cardiomegaly.  Question mild  pulmonary vascular congestion.   Electronically Signed   By: Ivar Drape M.D.   On: 05/27/2015 08:04   Dg Chest Port 1 View 05/26/2015   CLINICAL DATA:  Transcatheter aortic valve replacement. Aortic valve stenosis  EXAM: PORTABLE CHEST - 1 VIEW  COMPARISON:  May 22, 2015  FINDINGS: There is now a prosthetic aortic valve present. Swan-Ganz catheter tip is in the distal right main pulmonary artery. No pneumothorax. There is no edema or consolidation. Heart is mildly enlarged with pulmonary vascular within normal limits. Patient is also status post coronary artery bypass grafting. No adenopathy.  IMPRESSION: Swan-Ganz catheter tip and distal right main pulmonary artery. No pneumothorax. Stable cardiomegaly. No edema or consolidation. Prosthetic aortic valve present.   Electronically Signed   By: Lowella Grip III M.D.   On: 05/26/2015 11:40   Dg  Swallowing Func-speech Pathology 05/30/2015    Objective Swallowing Evaluation:    Patient Details  Name: Katherine [name] MRN: [id number] Date of Birth: January 26, 1928  Today's Date: 05/30/2015 Time: SLP Start Time (ACUTE ONLY): 1045-SLP Stop Time (ACUTE ONLY): 1110 SLP Time Calculation (min) (ACUTE ONLY): 25 min.             Nursing reports changes in the patient's  respiratory status and concerns for aspiration.  The patient was made NPO  this AM.  Most current chest x-ray from 05/30/2015 showed vascular  congestion, cardiolegaly and bibasilar opacities with possible mild PNA.     No Data Recorded  Clinical Impressions: Pt presenting with mild oral and mild to moderate  pharyngeal dysphagia. Pt with delay in swallow initiation with thin  liquids at the level of the pyriform sinuses. Delay in swallow initiation  triggered at the valleculae with all other consistencies including puree,  nectar thick liquids, and regular consistencies. Silent penetration of  thin liquids evidenced when the patient used a straw. This did not pass  below the level of the true vocal cords, however volitional cough did not  clear the penetrates. No penetration or aspiration of thin liquids were  seen when the patient used a cup or teaspoon. Pt with mild pyriform sinus  residue secondary to reduced laryngeal elevation. Recommend regular diet  with thin liquids. NO straw, medicines whole with puree. ST to follow up  for diet tolerance and aspiration precaution education.                Assessment / Plan / Recommendation CHL IP DIET RECOMMENDATION 05/30/2015  SLP Diet Recommendations Age appropriate regular solids;Thin  Liquid Administration via Cup, spoon, NO Straw  Medication Administration Whole with puree  Compensations   Postural Changes and/or Swallow Maneuvers (None)                     Assessment/Plan  Physical deconditioning  Will have patient work with PT/OT as tolerated to regain strength and restore function.  Fall  precautions are in place.  Severe aortic stenosis  S/P TAVR. Groin incision healing well. continue Tylenol 1000 mg bid and tramadol 50 mg 1-2 tabs q4h prn pain. To work with PT and OT. Has cardiology follow up. Continue plavix 75 mg daily.  Constipation Present, was started on senna s 2 tab bid along with her lactulose, linzess and miralax yesterday. Hydration encouraged. Monitor clinically for now. No signs of bowel obstruction on exam  Diastolic chf Stable at present, had post op pulmonary edema in hospital and responded well to diuresis, continue lisinopril 10 mg daily, bystolic 10  mg daily and hydralazine 50 mg tid with lasix 40 mg daily and monitor weight and bmp. Continue kcl supplement.  Blood loss anemia Monitor h&h, hb 10.8 at discharge  Dm type 2 a1c 8. Continue levemir 30 u bid and glucophage 500 mg daily. Monitor cbg  Anxiety mood is stable on Xanax 0.125 mg bid prn  Hypertension Elevated SBP. Monitor bp q shift. continue hydralazine 50 mg tid, bystolic 10 mg daily and lisinopril 10 mg daily.  Dementia  continue Namenda XR 28 mg daily  GERD  continue Nexium 20 mg daily  Hyperlipidemia  continue Crestor 20 mg daily   Goals of care: short term rehabilitation   Labs/tests ordered: cbc, bmp  Family/ staff Communication: reviewed care plan with patient and nursing supervisor    Blanchie Serve, MD  [facility] [phone] (Monday-Friday 8 am - 5 pm) [phone] (afterhours)

## 2015-06-09 ENCOUNTER — Ambulatory Visit: Payer: Medicare Other | Admitting: Podiatry

## 2015-06-10 ENCOUNTER — Ambulatory Visit (INDEPENDENT_AMBULATORY_CARE_PROVIDER_SITE_OTHER): Payer: Medicare Other | Admitting: Surgery

## 2015-06-10 ENCOUNTER — Encounter: Payer: Self-pay | Admitting: Surgery

## 2015-06-10 VITALS — BP 175/69 | HR 75 | Resp 20 | Ht 62.0 in

## 2015-06-10 DIAGNOSIS — Z954 Presence of other heart-valve replacement: Secondary | ICD-10-CM | POA: Diagnosis not present

## 2015-06-10 DIAGNOSIS — I2510 Atherosclerotic heart disease of native coronary artery without angina pectoris: Secondary | ICD-10-CM | POA: Diagnosis not present

## 2015-06-10 DIAGNOSIS — I350 Nonrheumatic aortic (valve) stenosis: Secondary | ICD-10-CM

## 2015-06-10 DIAGNOSIS — Z952 Presence of prosthetic heart valve: Secondary | ICD-10-CM

## 2015-06-10 NOTE — Progress Notes (Signed)
HPI: Patient returns for routine postoperative follow-up having undergone left transfemoral TAVR  on 05/26/2015. The patient's early postoperative recovery while in the hospital was notable for a slow postop course due to advanced age, severe preop deconditioning and morbid obesity. She was discharged to [facility]. She is here with her daughter today. Since hospital discharge the patients daughter reports that she has made very little progress with physical therapy. She feels that her mother  is limited by her arthritis. She has not ambulated since leaving the hospital but was only ambulating very short distances with a cane before surgery.   Current Outpatient Prescriptions  Medication Sig Dispense Refill  . acetaminophen (TYLENOL) 500 MG tablet Take 1,000 mg by mouth 2 (two) times daily. Take 1000 mg by mouth in the morning and take 1000 mg by mouth in the evening.    Marland Kitchen ALPRAZolam (XANAX) 0.25 MG tablet Take 0.5 tablets (0.125 mg total) by mouth 2 (two) times daily as needed for anxiety or sleep. 10 tablet 0  . atorvastatin (LIPITOR) 80 MG tablet Take 80 mg by mouth daily at 6 PM.    . clopidogrel (PLAVIX) 75 MG tablet Take 1 tablet (75 mg total) by mouth daily with breakfast. 30 tablet 11  . docusate sodium (COLACE) 100 MG capsule Take 1 capsule (100 mg total) by mouth 2 (two) times daily. 10 capsule 0  . erythromycin ophthalmic ointment Place 1 application into the right eye every 3 (three) hours.    . fexofenadine (ALLEGRA) 180 MG tablet Take 180 mg by mouth daily.    . furosemide (LASIX) 40 MG tablet Take 1 tablet (40 mg total) by mouth daily. 30 tablet 11  . guaiFENesin (MUCINEX) 600 MG 12 hr tablet Take 1 tablet (600 mg total) by mouth 2 (two) times daily.    . hydrALAZINE (APRESOLINE) 50 MG tablet Take 1 tablet (50 mg total) by mouth every 8 (eight) hours. 90 tablet 11  . insulin detemir (LEVEMIR) 100 UNIT/ML injection Inject 0.3 mLs (30 Units total) into the skin 2 (two) times  daily. 10 mL 11  . Ipratropium-Albuterol (COMBIVENT RESPIMAT) 20-100 MCG/ACT AERS respimat Inhale 2 puffs into the lungs every 6 (six) hours as needed for wheezing or shortness of breath. 1 Inhaler 2  . lactulose (CHRONULAC) 10 GM/15ML solution Take 45 mLs (30 g total) by mouth daily as needed for mild constipation or severe constipation. 240 mL 0  . Linaclotide (LINZESS) 145 MCG CAPS capsule Take 145 mcg by mouth daily as needed (for constipation).    Marland Kitchen lisinopril (PRINIVIL,ZESTRIL) 10 MG tablet Take 1 tablet (10 mg total) by mouth daily. 30 tablet 11  . loperamide (IMODIUM) 2 MG capsule Take 2 mg by mouth as needed for diarrhea or loose stools.    . Memantine HCl ER (NAMENDA XR) 28 MG CP24 Take 28 mg by mouth every morning.    . metFORMIN (GLUCOPHAGE-XR) 500 MG 24 hr tablet Take 500 mg by mouth daily with breakfast.     . nebivolol (BYSTOLIC) 10 MG tablet Take 10 mg by mouth every morning.    Marland Kitchen NEXIUM 20 MG capsule Take 20 mg by mouth daily.     . NON FORMULARY Apply 1-2 application topically 4 (four) times daily. DIC 3 % / BAC 2 % / GAB 5 % / LID-PRIL 2.5 % - 2.5 % / MENT 1 %    . polyethylene glycol (MIRALAX / GLYCOLAX) packet Take 17 g by mouth daily.  14 each 0  . potassium chloride SA (K-DUR,KLOR-CON) 20 MEQ tablet Take 20 mEq by mouth daily.    . traMADol (ULTRAM) 50 MG tablet Take 1-2 tablets (50-100 mg total) by mouth every 4 (four) hours as needed for moderate pain. 20 tablet 0  . VOLTAREN 1 % GEL Apply 2 g topically daily as needed (pain).      No current facility-administered medications for this visit.    Physical Exam: BP 175/69 mmHg  Pulse 75  Resp 20  Ht 5\' 2"  (1.575 m)  SpO2 98% She is in a wheel chair and slumped over but in no distress. She can barely sit forward for me to listen to her lungs. Lungs are clear Cardiac exam shows a regular rate and rhythm with normal heart sounds and no murmur. The left groin incision is intact and looks ok considering her belly is  hanging on it and she is wearing a wet diaper.  There is mild bilateral pedal edema.  Diagnostic Tests:  POD 1 echo       *[facility]*            [address], [place] [address]              [phone]  ------------------------------------------------------------------- Transthoracic Echocardiography  Patient:  Nou, Lizalde MR #:    [id number] Study Date: 05/27/2015 Gender:   F Age:    81 Height:   157.5 cm Weight:   106.6 kg BSA:    2.22 m^2 Pt. Status: Room:    2S05C  ATTENDING  Darlina Guys, MD ADMITTING  Sonny Dandy, [place], Inpatient SONOGRAPHER Roseanna Rainbow  cc:  ------------------------------------------------------------------- LV EF: 65% -  70%  ------------------------------------------------------------------- Indications:   Aortic stenosis 424.1.  ------------------------------------------------------------------- History:  PMH: Moderate to severe AS. Coronary artery disease. Angina pectoris. Congestive heart failure. Aortic valve disease. Risk factors: Dyslipidemia.  ------------------------------------------------------------------- Study Conclusions  - Left ventricle: Small hyperdynamic ventricle with no SAM but small LVOT gradient. The cavity size was normal. There was moderate concentric hypertrophy. Systolic function was vigorous. The estimated ejection fraction was in the range of 65% to 70%. - Aortic valve: Normal appearing 23 mm Sapien 3 stented valve No significant peri valvular regurgitations Mean gradient 14 mm Hg a bit higher than OR may be related to hyperdynamic function Valve area (VTI): 1.63 cm^2. Valve area (Vmax): 1.39 cm^2. Valve area (Vmean): 1.55 cm^2. -  Mitral valve: Calcified annulus. There was mild regurgitation. Valve area by continuity equation (using LVOT flow): 1.82 cm^2. - Left atrium: The atrium was moderately dilated. - Right atrium: The atrium was mildly dilated. - Atrial septum: No defect or patent foramen ovale was identified. - Tricuspid valve: There was moderate regurgitation. - Pulmonary arteries: PA peak pressure: 53 mm Hg (S). - Pericardium, extracardiac: A trivial pericardial effusion was identified posterior to the heart.  ------------------------------------------------------------------- Labs, prior tests, procedures, and surgery: Coronary artery bypass grafting.  Transthoracic echocardiography. M-mode, complete 2D, spectral Doppler, and color Doppler. Birthdate: Patient birthdate: [birth year]. Age: Patient is 81 yr old. Sex: Gender: female. BMI: 43 kg/m^2. Blood pressure:   134/62 Patient status: Inpatient. Study date: Study date: 05/27/2015. Study time: 12:54 PM. Location: ICU/CCU  -------------------------------------------------------------------  ------------------------------------------------------------------- Left ventricle: Small hyperdynamic ventricle with no SAM but small LVOT gradient.  The cavity size was normal. There was moderate concentric hypertrophy. Systolic function was vigorous. The estimated ejection fraction was in the range of 65% to 70%.  ------------------------------------------------------------------- Aortic valve: Normal appearing 23 mm Sapien 3 stented valve No significant peri valvular regurgitations Mean gradient 14 mm Hg a bit higher than OR may be related to hyperdynamic function Doppler:   VTI ratio of LVOT to aortic valve: 0.57. Valve area (VTI): 1.63 cm^2. Indexed valve area (VTI): 0.73 cm^2/m^2. Peak velocity ratio of LVOT to aortic valve: 0.49. Valve area (Vmax): 1.39 cm^2. Indexed valve area (Vmax): 0.63 cm^2/m^2. Mean velocity ratio of LVOT to  aortic valve: 0.54. Valve area (Vmean): 1.55 cm^2. Indexed valve area (Vmean): 0.7 cm^2/m^2.  Mean gradient (S): 14 mm Hg. Peak gradient (S): 31 mm Hg.  ------------------------------------------------------------------- Mitral valve:  Calcified annulus. Doppler: There was mild regurgitation.  Valve area by continuity equation (using LVOT flow): 1.82 cm^2. Indexed valve area by continuity equation (using LVOT flow): 0.82 cm^2/m^2.  Mean gradient (D): 5 mm Hg. Peak gradient (D): 4 mm Hg.  ------------------------------------------------------------------- Left atrium: The atrium was moderately dilated.  ------------------------------------------------------------------- Atrial septum: No defect or patent foramen ovale was identified.  ------------------------------------------------------------------- Right ventricle: The cavity size was normal. Wall thickness was normal. Systolic function was normal.  ------------------------------------------------------------------- Pulmonic valve:  Doppler: There was mild regurgitation.  ------------------------------------------------------------------- Tricuspid valve:  Doppler: There was moderate regurgitation.  ------------------------------------------------------------------- Right atrium: The atrium was mildly dilated.  ------------------------------------------------------------------- Pericardium: A trivial pericardial effusion was identified posterior to the heart.  ------------------------------------------------------------------- Measurements  Left ventricle              Value     Reference LV ID, ED, PLAX chordal      (L)   34.7 mm    43 - 52 LV ID, ES, PLAX chordal      (L)   21.1 mm    23 - 38 LV fx shortening, PLAX chordal      39  %    >=29 LV PW thickness, ED            14.4 mm    --------- IVS/LV PW ratio, ED             1       <=1.3 Stroke volume, 2D             83  ml    --------- Stroke volume/bsa, 2D           37  ml/m^2  --------- LV e&', lateral              5.66 cm/s   --------- LV E/e&', lateral             17.56     --------- LV e&', medial               6.2  cm/s   --------- LV E/e&', medial              16.03     --------- LV e&', average              5.93 cm/s   --------- LV E/e&', average             16.76     ---------  Ventricular septum            Value     Reference IVS thickness, ED             14.39 mm    ---------  LVOT                   Value     Reference LVOT ID, S                19  mm    --------- LVOT area                 2.84 cm^2   --------- LVOT peak velocity, S           136  cm/s   --------- LVOT mean velocity, S           90.9 cm/s   --------- LVOT VTI, S                29.4 cm    --------- LVOT peak gradient, S           7   mm Hg  ---------  Aortic valve               Value     Reference Aortic valve peak velocity, S       278  cm/s   --------- Aortic valve mean velocity, S       167  cm/s   --------- Aortic valve VTI, S            51.3 cm    --------- Aortic mean gradient, S          14  mm Hg  --------- Aortic peak gradient, S          31  mm Hg  --------- VTI ratio, LVOT/AV            0.57      --------- Aortic valve area, VTI          1.63 cm^2   --------- Aortic valve area/bsa, VTI        0.73 cm^2/m^2 --------- Velocity ratio, peak, LVOT/AV       0.49      --------- Aortic valve area, peak velocity      1.39 cm^2   --------- Aortic valve area/bsa, peak        0.63 cm^2/m^2 --------- velocity Velocity ratio, mean, LVOT/AV       0.54      --------- Aortic valve area, mean velocity     1.55 cm^2   --------- Aortic valve area/bsa, mean        0.7  cm^2/m^2 --------- velocity  Aorta                   Value     Reference Aortic root ID, ED            41  mm    ---------  Left atrium                Value     Reference LA ID, A-P, ES              50  mm    --------- LA ID/bsa, A-P          (H)   2.25 cm/m^2  <=2.2 LA volume, S               71.6 ml    --------- LA volume/bsa, S             32.2 ml/m^2  --------- LA volume, ES, 1-p A4C          73.8 ml    --------- LA volume/bsa, ES,  1-p A4C        33.2 ml/m^2  --------- LA volume, ES, 1-p A2C          69.6 ml    --------- LA volume/bsa, ES, 1-p A2C        31.3 ml/m^2  ---------  Mitral valve               Value     Reference Mitral E-wave peak velocity        99.4 cm/s   --------- Mitral A-wave peak velocity        102  cm/s   --------- Mitral mean velocity, D          96.6 cm/s   --------- Mitral deceleration time     (H)   261  ms    150 - 230 Mitral mean gradient, D          5   mm Hg  --------- Mitral peak gradient, D          4   mm Hg  --------- Mitral E/A ratio, peak          1       --------- Mitral valve area, LVOT          1.82 cm^2   --------- continuity Mitral valve area/bsa, LVOT        0.82 cm^2/m^2 --------- continuity Mitral annulus VTI, D           45.9 cm    ---------  Pulmonary arteries            Value     Reference PA  pressure, S, DP        (H)   53  mm Hg  <=30  Tricuspid valve              Value     Reference Tricuspid regurg peak velocity      335  cm/s   --------- Tricuspid peak RV-RA gradient       45  mm Hg  ---------  Systemic veins              Value     Reference Estimated CVP               8   mm Hg  ---------  Right ventricle              Value     Reference TAPSE                   13.7 mm    --------- RV pressure, S, DP        (H)   53  mm Hg  <=30 RV s&', lateral, S             11.4 cm/s   ---------  Legend: (L) and (H) mark values outside specified reference range.  ------------------------------------------------------------------- Prepared and Electronically Authenticated by  Jenkins Rouge, M.D. 2016-06-22T14:39:31  Impression:  Overall she is minimally improved after TAVR surgery. She says that her breathing is better but she remains very weak and non-ambulatory due to morbid obesity, advanced age, arthritis and chronic deconditioning. Hopefully she will improve with continued PT and time. Her daughter wanted her to resume a compounded topical medication similar to Voltaren gel for her arthritis that Dr. Nevada Crane had prescribed. I sent a prescription to [facility] place to let her use that from home.   Plan:  She is going to see Dr. Angelena Form in a  few weeks and will have a one month echo at that time.    Gaye Pollack, MD [facility] [phone]

## 2015-07-02 ENCOUNTER — Encounter: Payer: Self-pay | Admitting: Cardiovascular Disease

## 2015-07-02 ENCOUNTER — Ambulatory Visit (INDEPENDENT_AMBULATORY_CARE_PROVIDER_SITE_OTHER): Payer: Medicare Other | Admitting: Cardiovascular Disease

## 2015-07-02 ENCOUNTER — Ambulatory Visit (HOSPITAL_COMMUNITY): Payer: No Typology Code available for payment source | Attending: Cardiology

## 2015-07-02 ENCOUNTER — Other Ambulatory Visit: Payer: Self-pay

## 2015-07-02 ENCOUNTER — Other Ambulatory Visit: Payer: Self-pay | Admitting: Surgery

## 2015-07-02 VITALS — BP 144/68 | HR 77 | Ht 62.0 in

## 2015-07-02 DIAGNOSIS — I2510 Atherosclerotic heart disease of native coronary artery without angina pectoris: Secondary | ICD-10-CM | POA: Diagnosis not present

## 2015-07-02 DIAGNOSIS — I340 Nonrheumatic mitral (valve) insufficiency: Secondary | ICD-10-CM | POA: Insufficient documentation

## 2015-07-02 DIAGNOSIS — I351 Nonrheumatic aortic (valve) insufficiency: Secondary | ICD-10-CM | POA: Diagnosis not present

## 2015-07-02 DIAGNOSIS — Z954 Presence of other heart-valve replacement: Secondary | ICD-10-CM

## 2015-07-02 DIAGNOSIS — I350 Nonrheumatic aortic (valve) stenosis: Secondary | ICD-10-CM | POA: Diagnosis not present

## 2015-07-02 DIAGNOSIS — Z952 Presence of prosthetic heart valve: Secondary | ICD-10-CM

## 2015-07-02 DIAGNOSIS — I059 Rheumatic mitral valve disease, unspecified: Secondary | ICD-10-CM | POA: Diagnosis not present

## 2015-07-02 NOTE — Patient Instructions (Signed)
Medication Instructions:  Your physician recommends that you continue on your current medications as directed. Please refer to the Current Medication list given to you today.   Labwork: none  Testing/Procedures: none  Follow-Up: Your physician recommends that you schedule a follow-up appointment in: 8 weeks with Dr. Debara Pickett  Your physician wants you to follow-up in: 12 months with Dr. Angelena Form.  You will receive a reminder letter in the mail two months in advance. If you don't receive a letter, please call our office to schedule the follow-up appointment.

## 2015-07-02 NOTE — Progress Notes (Signed)
Multi-Disciplinary Valve Clinic Note  No chief complaint on file.   History of Present Illness: 81 yo female with history of severe aortic valve stenosis now s/p TAVR on 05/26/15, CAD s/p 4V CABG in 2003, DM, HTN, hyperlipidemia, chronic diastolic CHF, obesity who is here today for her one month follow up after [name] procedure. She had been followed for aortic valve stenosis by Dr. Debara Pickett. Her aortic valve disease progressed over the last year and she developed chest pain with dyspnea with minimal exertion.  [name] procedure was performed on 05/26/15. She did well following the procedure with slow recovery due to her immobility. She was discharged to [facility].   She is here today for follow up. She is doing well. No chest pain. Breathing is improved. Her energy level is better. No LE edema. She is still mostly wheelchair bound due to leg weakness and knee pain. Echo today with normally functioning bioprosthetic valve with mean gradient 18-20 mm Hg, normal LV function. Trivial AI.   Primary Care Physician: Delphina Cahill Primary Cardiologist: Dr. Debara Pickett   Past Medical History  Diagnosis Date  . Gastric mass   . Hernia of unspecified site of abdominal cavity without mention of obstruction or gangrene     hiatal  . Gastric ulcer   . Thyroid mass   . GERD (gastroesophageal reflux disease)   . Anxiety   . Coronary artery disease   . Osteoarthritis   . Anemia   . Hypertension   . Hyperlipidemia   . Diabetes mellitus, type 2   . Aortic valvular stenosis     moderate to severe  EF on 2D Echo 10/22/2012 65-70%  valve area of 1.2cm, and peak and mean gradients of 45 and 65mmHg  . CHF (congestive heart failure)   . Dyspnea     chronic  . Easy fatigability   . Normal cardiac stress test 12/2013    normal stress nuclear study  . Heart murmur   . Chalazion of right upper eyelid early June 2016    treating with cream, Rx by Dr. Merlyn Albert      Past Surgical History  Procedure Laterality Date   . Coronary artery bypass graft      x4 in 2003  . Tubal ligation    . Knee surgery Right   . Uterine polypectomy    . Internal hemorrhoidectomy    . Cataract extraction Bilateral   . Left and right heart catheterization with coronary/graft angiogram N/A 03/24/2015    Procedure: LEFT AND RIGHT HEART CATHETERIZATION WITH Beatrix Fetters;  Surgeon: Burnell Blanks, MD;  Location: [facility] CATH LAB;  Service: Cardiovascular;  Laterality: N/A;  . Transcatheter aortic valve replacement, transfemoral N/A 05/26/2015    Procedure: TRANSCATHETER AORTIC VALVE REPLACEMENT, TRANSFEMORAL;  Surgeon: Burnell Blanks, MD;  Location: [place];  Service: Open Heart Surgery;  Laterality: N/A;  . Tee without cardioversion N/A 05/26/2015    Procedure: TRANSESOPHAGEAL ECHOCARDIOGRAM (TEE);  Surgeon: Burnell Blanks, MD;  Location: [place];  Service: Open Heart Surgery;  Laterality: N/A;    Current outpatient prescriptions:  .  acetaminophen (TYLENOL) 500 MG tablet, Take 1,000 mg by mouth 2 (two) times daily. Take 1000 mg by mouth in the morning and take 1000 mg by mouth in the evening., Disp: , Rfl:  .  ALPRAZolam (XANAX) 0.25 MG tablet, Take 0.5 tablets (0.125 mg total) by mouth 2 (two) times daily as needed for anxiety or sleep., Disp: 10 tablet, Rfl: 0 .  docusate sodium (COLACE) 100 MG capsule, Take 1 capsule (100 mg total) by mouth 2 (two) times daily., Disp: 10 capsule, Rfl: 0 .  erythromycin ophthalmic ointment, Place 1 application into the right eye every 3 (three) hours., Disp: , Rfl:  .  furosemide (LASIX) 40 MG tablet, Take 1 tablet (40 mg total) by mouth daily., Disp: 30 tablet, Rfl: 11 .  guaiFENesin (MUCINEX) 600 MG 12 hr tablet, Take 1 tablet (600 mg total) by mouth 2 (two) times daily., Disp: , Rfl:  .  hydrALAZINE (APRESOLINE) 50 MG tablet, Take 1 tablet (50 mg total) by mouth every 8 (eight) hours., Disp: 90 tablet, Rfl: 11 .  insulin detemir (LEVEMIR) 100 UNIT/ML injection,  Inject 0.3 mLs (30 Units total) into the skin 2 (two) times daily., Disp: 10 mL, Rfl: 11 .  Ipratropium-Albuterol (COMBIVENT RESPIMAT) 20-100 MCG/ACT AERS respimat, Inhale 2 puffs into the lungs every 6 (six) hours as needed for wheezing or shortness of breath., Disp: 1 Inhaler, Rfl: 2 .  lactulose (CHRONULAC) 10 GM/15ML solution, Take 45 mLs (30 g total) by mouth daily as needed for mild constipation or severe constipation., Disp: 240 mL, Rfl: 0 .  Linaclotide (LINZESS) 145 MCG CAPS capsule, Take 145 mcg by mouth daily as needed (for constipation)., Disp: , Rfl:  .  lisinopril (PRINIVIL,ZESTRIL) 10 MG tablet, Take 1 tablet (10 mg total) by mouth daily., Disp: 30 tablet, Rfl: 11 .  loperamide (IMODIUM) 2 MG capsule, Take 2 mg by mouth as needed for diarrhea or loose stools., Disp: , Rfl:  .  Memantine HCl ER (NAMENDA XR) 28 MG CP24, Take 28 mg by mouth every morning., Disp: , Rfl:  .  metFORMIN (GLUCOPHAGE-XR) 500 MG 24 hr tablet, Take 500 mg by mouth daily with breakfast. , Disp: , Rfl:  .  nebivolol (BYSTOLIC) 10 MG tablet, Take 10 mg by mouth every morning., Disp: , Rfl:  .  NEXIUM 20 MG capsule, Take 20 mg by mouth daily. , Disp: , Rfl:  .  NON FORMULARY, Apply 1-2 application topically 4 (four) times daily. DIC 3 % / BAC 2 % / GAB 5 % / LID-PRIL 2.5 % - 2.5 % / MENT 1 %, Disp: , Rfl:  .  polyethylene glycol (MIRALAX / GLYCOLAX) packet, Take 17 g by mouth daily., Disp: 14 each, Rfl: 0 .  potassium chloride SA (K-DUR,KLOR-CON) 20 MEQ tablet, Take 20 mEq by mouth daily., Disp: , Rfl:  .  traMADol (ULTRAM) 50 MG tablet, Take 1-2 tablets (50-100 mg total) by mouth every 4 (four) hours as needed for moderate pain., Disp: 20 tablet, Rfl: 0 .  VOLTAREN 1 % GEL, Apply 2 g topically daily as needed (pain). , Disp: , Rfl:  .  atorvastatin (LIPITOR) 80 MG tablet, Take 80 mg by mouth daily at 6 PM., Disp: , Rfl:  .  clopidogrel (PLAVIX) 75 MG tablet, Take 1 tablet (75 mg total) by mouth daily with breakfast.  (Patient not taking: Reported on 07/02/2015), Disp: 30 tablet, Rfl: 11 .  Diclofenac Sodium POWD, , Disp: , Rfl:  .  fexofenadine (ALLEGRA) 180 MG tablet, Take 180 mg by mouth daily., Disp: , Rfl:   No Known Allergies  History   Social History  . Marital Status: Widowed    Spouse Name: N/A  . Number of Children: 5  . Years of Education: N/A   Occupational History  . Health and safety inspector and CNA    Social History Main Topics  . Smoking status:  Never Smoker   . Smokeless tobacco: Never Used  . Alcohol Use: No  . Drug Use: No  . Sexual Activity: Not on file   Other Topics Concern  . Not on file   Social History Narrative    Family History  Problem Relation Age of Onset  . Colon cancer Neg Hx   . Diabetes Mother   . Heart disease Mother   . Stroke Father   . Stroke Mother   . Heart attack Brother   . Cerebral aneurysm Mother     Review of Systems:  As stated in the HPI and otherwise negative.   BP 144/68 mmHg  Pulse 77  Ht 5\' 2"  (1.575 m)  Wt   SpO2 97%  Physical Examination: General: Well developed, well nourished, NAD HEENT: OP clear, mucus membranes moist SKIN: warm, dry. No rashes. Neuro: No focal deficits Musculoskeletal: Muscle strength 5/5 all ext Psychiatric: Mood and affect normal Neck: No JVD, no carotid bruits, no thyromegaly, no lymphadenopathy. Lungs:Clear bilaterally, no wheezes, rhonci, crackles Cardiovascular: Regular rate and rhythm. Systolic murmur noted. No gallops or rubs. Abdomen:Soft. Bowel sounds present. Non-tender.  Extremities: No lower extremity edema. Pulses are 2 + in the bilateral DP/PT.  Echo 07/02/15: Left ventricle: The cavity size was normal. Wall thickness was increased in a pattern of moderate LVH. Systolic function was normal. The estimated ejection fraction was in the range of 60% to 65%. Wall motion was normal; there were no regional wall motion abnormalities. Doppler parameters are consistent with abnormal  left ventricular relaxation (grade 1 diastolic dysfunction). - Aortic valve: A bioprosthesis was present. There was trivial regurgitation. - Mitral valve: Calcified annulus. Mildly thickened leaflets . There was moderate regurgitation. - Left atrium: The atrium was mildly dilated. - Pulmonary arteries: Systolic pressure was severely increased. PA peak pressure: 57 mm Hg (S).  Impressions:  - Normal LV function; moderate LVH; grade 1 diastolic dysfunction; mild LAE; s/p TAVR with trace AI and normal to mildly elevated gradient of 20 mmHg; MAC with moderate MR; mild TR with severely elevated pulmonary pressure.  Assessment and Plan:   1. Severe aortic valve stenosis: She has been doing well post TAVR. She is making a slow but progressive recovery, mostly limited by knee pain. She has some fatigue but is overall improved. She is NYHA Class II. She is on Plavix. Echo today with normally functioning AVR with mean gradient 18-20 mm Hg (expected post TAVR), no significant AI. She is still at [facility]. Will have f/u with Dr. Debara Pickett in 4-6 weeks and then f/u in TAVR clinic with me in one year.   Current medicines are reviewed at length with the patient today.  The patient does not have concerns regarding medicines.  The following changes have been made:  no change  Labs/ tests ordered today include:  No orders of the defined types were placed in this encounter.    Disposition:   FU with me for cardiac cath.   Signed, Lauree Chandler, MD 07/02/2015 2:34 PM    [address], [place], [place]  [address] Phone: [phone]; Fax: [phone]

## 2015-07-10 ENCOUNTER — Non-Acute Institutional Stay (SKILLED_NURSING_FACILITY): Payer: Medicare Other | Admitting: Adult Health

## 2015-07-10 ENCOUNTER — Encounter: Payer: Self-pay | Admitting: Adult Health

## 2015-07-10 DIAGNOSIS — N390 Urinary tract infection, site not specified: Secondary | ICD-10-CM

## 2015-07-15 ENCOUNTER — Encounter: Payer: Self-pay | Admitting: *Deleted

## 2015-07-24 ENCOUNTER — Non-Acute Institutional Stay (SKILLED_NURSING_FACILITY): Payer: Medicare Other | Admitting: Adult Health

## 2015-07-24 ENCOUNTER — Encounter: Payer: Self-pay | Admitting: Adult Health

## 2015-07-24 DIAGNOSIS — I5032 Chronic diastolic (congestive) heart failure: Secondary | ICD-10-CM

## 2015-07-24 DIAGNOSIS — E876 Hypokalemia: Secondary | ICD-10-CM

## 2015-07-24 DIAGNOSIS — R5381 Other malaise: Secondary | ICD-10-CM | POA: Diagnosis not present

## 2015-07-24 DIAGNOSIS — D62 Acute posthemorrhagic anemia: Secondary | ICD-10-CM | POA: Diagnosis not present

## 2015-07-24 DIAGNOSIS — E785 Hyperlipidemia, unspecified: Secondary | ICD-10-CM

## 2015-07-24 DIAGNOSIS — I10 Essential (primary) hypertension: Secondary | ICD-10-CM | POA: Diagnosis not present

## 2015-07-24 DIAGNOSIS — E1165 Type 2 diabetes mellitus with hyperglycemia: Secondary | ICD-10-CM

## 2015-07-24 DIAGNOSIS — K219 Gastro-esophageal reflux disease without esophagitis: Secondary | ICD-10-CM | POA: Diagnosis not present

## 2015-07-24 DIAGNOSIS — I350 Nonrheumatic aortic (valve) stenosis: Secondary | ICD-10-CM

## 2015-07-24 DIAGNOSIS — F0390 Unspecified dementia without behavioral disturbance: Secondary | ICD-10-CM | POA: Diagnosis not present

## 2015-07-24 DIAGNOSIS — IMO0002 Reserved for concepts with insufficient information to code with codable children: Secondary | ICD-10-CM

## 2015-07-24 DIAGNOSIS — K5900 Constipation, unspecified: Secondary | ICD-10-CM | POA: Diagnosis not present

## 2015-07-24 DIAGNOSIS — E43 Unspecified severe protein-calorie malnutrition: Secondary | ICD-10-CM

## 2015-07-24 DIAGNOSIS — F419 Anxiety disorder, unspecified: Secondary | ICD-10-CM

## 2015-07-24 DIAGNOSIS — J309 Allergic rhinitis, unspecified: Secondary | ICD-10-CM

## 2015-07-26 ENCOUNTER — Encounter: Payer: Self-pay | Admitting: Adult Health

## 2015-07-26 NOTE — Progress Notes (Signed)
Patient ID: Katherine [name], female   DOB: 27-Jan-1928, 81 y.o.   MRN: [id number]   07/10/15  Facility:  [facility] Home Location:  [place] Room Number: 907-P LEVEL OF CARE:  SNF (31)   Chief Complaint  Patient presents with  . Acute Visit:  UTI        HISTORY OF PRESENT ILLNESS:  This is an 81 year old female who has a urine culture showing 100,000 colonies/ml E. Coli. No fever nor hematuria.    She is here @ [facility] for a short-term rehabilitation.  PAST MEDICAL HISTORY:  Past Medical History  Diagnosis Date  . Gastric mass   . Hernia of unspecified site of abdominal cavity without mention of obstruction or gangrene     hiatal  . Gastric ulcer   . Thyroid mass   . GERD (gastroesophageal reflux disease)   . Anxiety   . Coronary artery disease   . Osteoarthritis   . Anemia   . Hypertension   . Hyperlipidemia   . Diabetes mellitus, type 2   . Aortic valvular stenosis     moderate to severe  EF on 2D Echo 10/22/2012 65-70%  valve area of 1.2cm, and peak and mean gradients of 45 and 38mmHg  . CHF (congestive heart failure)   . Dyspnea     chronic  . Easy fatigability   . Normal cardiac stress test 12/2013    normal stress nuclear study  . Heart murmur   . Chalazion of right upper eyelid early June 2016    treating with cream, Rx by Dr. Merlyn Albert      CURRENT MEDICATIONS: Reviewed per MAR/see medication list  No Known Allergies   REVIEW OF SYSTEMS:  GENERAL: no change in appetite, no fatigue, no weight changes, no fever, chills or weakness RESPIRATORY: no cough, SOB, DOE, wheezing, hemoptysis CARDIAC: no chest pain, or palpitations GI: no abdominal pain, diarrhea, heart burn, nausea or vomiting, +constipation  PHYSICAL EXAMINATION  GENERAL: no acute distress, obese EYES: conjunctivae normal, sclerae normal, right eyelid has a stye NECK: supple, trachea midline, no neck masses, no thyroid tenderness, no  thyromegaly LYMPHATICS: no LAN in the neck, no supraclavicular LAN RESPIRATORY: breathing is even & unlabored, BS CTAB CARDIAC: RRR, no murmur,no extra heart sounds, BLE edema trace edema GI: abdomen soft, normal BS, no masses, no tenderness, no hepatomegaly, no splenomegaly EXTREMITIES:  Able to move 4 extremities PSYCHIATRIC: the patient is alert & oriented to person, affect & behavior appropriate  LABS/RADIOLOGY: Labs reviewed: Basic Metabolic Panel:  Recent Labs  05/27/15 0400 05/27/15 1349 05/28/15 0315  06/01/15 1203 06/02/15 0612 06/03/15 0410 06/05/15  NA 137  --  137  < > 139 141 144 145  K 3.6  --  3.6  < > 3.7 3.3* 3.9 3.7  CL 104  --  103  < > 98* 102 101  --   CO2 26  --  27  < > 32 33* 30  --   GLUCOSE 140*  --  153*  < > 201* 101* 37*  --   BUN 9  --  9  < > 30* 31* 30* 32*  CREATININE 0.76  --  0.73  < > 0.95 0.96 1.03* 1.0  CALCIUM 9.4  --  9.9  < > 10.2 10.0 10.7*  --   MG 1.1* 1.7 1.7  --   --   --   --   --   < > =  values in this interval not displayed. Liver Function Tests:  Recent Labs  09/07/14 1214 05/22/15 1310 06/05/15  AST 14 17 18   ALT 12 14 16   ALKPHOS 90 84 65  BILITOT 0.3 0.4  --   PROT 7.5 7.5  --   ALBUMIN 3.6 3.4*  --    CBC:  Recent Labs  11/21/14 1501  05/28/15 0315 05/30/15 0428 05/31/15 1020 06/05/15  WBC 8.3  < > 12.3* 12.2* 9.4 8.4  NEUTROABS 5.7  --   --   --  6.5  --   HGB 12.4  < > 11.3* 12.0 10.9* 10.6*  HCT 35.4*  < > 32.1* 34.2* 31.6* 32*  MCV 80.3  < > 77.5* 77.4* 78.4  --   PLT 306  < > 202 244 252 88*  < > = values in this interval not displayed.  Cardiac Enzymes:  Recent Labs  11/21/14 1501  TROPONINI <0.30   CBG:  Recent Labs  06/03/15 0517 06/03/15 0757 06/03/15 1129  GLUCAP 108* 118* 134*     ASSESSMENT/PLAN:  UTI - start Bactrim DS 1 tab PO BID X 7 days and Florastor 250 mg 1 capsule PO BID X 10 days    [facility], NP [employer] [phone]

## 2015-07-26 NOTE — Progress Notes (Addendum)
Patient ID: Katherine [name], female   DOB: 03/23/1928, 81 y.o.   MRN: [id number]   07/24/15 Facility:  [facility] Home Location:  [place] Room Number: 907-P LEVEL OF CARE:  SNF (31)   Chief Complaint  Patient presents with  . Discharge Notes    Physical deconditioning, severe aortic stenosis S/P TAVR, anxiety, constipation, hypertension, diabetes mellitus, dementia, GERD, hypokalemia, hyperlipidemia, diastolic CHF, allergic rhinitis and protein calorie malnutrition     HISTORY OF PRESENT ILLNESS:  This is an 81 year old female who is for discharge home with Home health PT, OT, Nursing and Social worker. DME: semi-electric hospital bed, hoyer lift, standard wheelchair, cushion, leg rests and anti-tippers. She will discharge with prescriptions and medications. She has been  admitted [facility] on 06/03/15 from [facility]. She has PMH of severe aortic stenosis, hypertension, diabetes mellitus, hyperlipidemia and GERD. She has severe aortic stenosis. Her symptoms include marked fatigue and dyspnea on exertion. She had TAVR on 05/26/15.  Patient was admitted to this facility for short-term rehabilitation after the patient's recent hospitalization.  Patient has completed SNF rehabilitation and therapy has cleared the patient for discharge.  PAST MEDICAL HISTORY:  Past Medical History  Diagnosis Date  . Gastric mass   . Hernia of unspecified site of abdominal cavity without mention of obstruction or gangrene     hiatal  . Gastric ulcer   . Thyroid mass   . GERD (gastroesophageal reflux disease)   . Anxiety   . Coronary artery disease   . Osteoarthritis   . Anemia   . Hypertension   . Hyperlipidemia   . Diabetes mellitus, type 2   . Aortic valvular stenosis     moderate to severe  EF on 2D Echo 10/22/2012 65-70%  valve area of 1.2cm, and peak and mean gradients of 45 and 75mmHg  . CHF (congestive heart failure)   . Dyspnea     chronic  .  Easy fatigability   . Normal cardiac stress test 12/2013    normal stress nuclear study  . Heart murmur   . Chalazion of right upper eyelid early June 2016    treating with cream, Rx by Dr. Merlyn Albert      CURRENT MEDICATIONS: Reviewed per MAR/see medication list    Medication List       This list is accurate as of: 07/24/15 11:59 PM.  Always use your most recent med list.               acetaminophen 500 MG tablet  Commonly known as:  TYLENOL  Take two tablets by mouth twice daily for pain. Do not exceed 4gm of Tylenol in 24 hours     ALPRAZolam 0.25 MG tablet  Commonly known as:  XANAX  Take 0.5 tablets (0.125 mg total) by mouth 2 (two) times daily as needed for anxiety or sleep.     atorvastatin 80 MG tablet  Commonly known as:  LIPITOR  Take one tablet by mouth once daily for cholesterol     clopidogrel 75 MG tablet  Commonly known as:  PLAVIX  Take 1 tablet (75 mg total) by mouth daily with breakfast.     fexofenadine 180 MG tablet  Commonly known as:  ALLEGRA  Take 180 mg by mouth daily.     furosemide 40 MG tablet  Commonly known as:  LASIX  Take 1 tablet (40 mg total) by mouth daily.     guaiFENesin 600 MG  12 hr tablet  Commonly known as:  MUCINEX  Take 1 tablet (600 mg total) by mouth 2 (two) times daily.     hydrALAZINE 50 MG tablet  Commonly known as:  APRESOLINE  Take 1 tablet (50 mg total) by mouth every 8 (eight) hours.     insulin detemir 100 UNIT/ML injection  Commonly known as:  LEVEMIR  Inject 0.3 mLs (30 Units total) into the skin 2 (two) times daily.     Ipratropium-Albuterol 20-100 MCG/ACT Aers respimat  Commonly known as:  COMBIVENT RESPIMAT  Inhale 2 puffs into the lungs every 6 (six) hours as needed for wheezing or shortness of breath.     lactulose 10 GM/15ML solution  Commonly known as:  CHRONULAC  Take 45 mLs (30 g total) by mouth daily as needed for mild constipation or severe constipation.     LINZESS 145 MCG Caps capsule    Generic drug:  Linaclotide  Take 145 mcg by mouth daily as needed (for constipation).     lisinopril 10 MG tablet  Commonly known as:  PRINIVIL,ZESTRIL  Take 1 tablet (10 mg total) by mouth daily.     loperamide 2 MG capsule  Commonly known as:  IMODIUM  Take 2 mg by mouth as needed for diarrhea or loose stools.     metFORMIN 500 MG 24 hr tablet  Commonly known as:  GLUCOPHAGE-XR  Take one tablet by mouth once daily with breakfast to control blood sugar. Do not crush     NAMENDA XR 28 MG Cp24 24 hr capsule  Generic drug:  memantine  Take one capsule by mouth every morning for dementia     nebivolol 10 MG tablet  Commonly known as:  BYSTOLIC  Take one tablet by mouth every morning for blood pressure     NEXIUM 20 MG capsule  Generic drug:  esomeprazole  Take one capsule by mouth once daily for reflux     polyethylene glycol packet  Commonly known as:  MIRALAX / GLYCOLAX  Take 17 g by mouth daily.     potassium chloride SA 20 MEQ tablet  Commonly known as:  K-DUR,KLOR-CON  Take 20 mEq by mouth daily.     PROCEL Powd  Administer two scoops by mouth twice daily for nutritional support     sennosides-docusate sodium 8.6-50 MG tablet  Commonly known as:  SENOKOT-S  Take two tablets by mouth twice daily     traMADol 50 MG tablet  Commonly known as:  ULTRAM  Take 1-2 tablets (50-100 mg total) by mouth every 4 (four) hours as needed for moderate pain.     VOLTAREN 1 % Gel  Generic drug:  diclofenac sodium  Apply 2 g topically daily as needed (pain).         No Known Allergies   REVIEW OF SYSTEMS:  GENERAL: no change in appetite, no fatigue, no weight changes, no fever, chills or weakness RESPIRATORY: no cough, SOB, DOE, wheezing, hemoptysis CARDIAC: no chest pain, or palpitations GI: no abdominal pain, diarrhea, heart burn, nausea or vomiting  PHYSICAL EXAMINATION  GENERAL: no acute distress, obese SKIN:  Left inguinal area surgical incision glued, dry, no  erythema EYES: conjunctivae normal, sclerae normal, right eyelid has a stye NECK: supple, trachea midline, no neck masses, no thyroid tenderness, no thyromegaly LYMPHATICS: no LAN in the neck, no supraclavicular LAN RESPIRATORY: breathing is even & unlabored, BS CTAB CARDIAC: RRR, no murmur,no extra heart sounds, BUE and BLE edema 1+ GI: abdomen  soft, normal BS, no masses, no tenderness, no hepatomegaly, no splenomegaly EXTREMITIES:  Able to move 4 extremities PSYCHIATRIC: the patient is alert & oriented to person, affect & behavior appropriate  LABS/RADIOLOGY: Labs reviewed: 07/09/15   Urine culture >= 100,000 colonies/ml E coli 06/22/15  right shoulder x-ray shows significant narrowed subacromial space implying impingement syndrome; no acute fracture or dislocation; right elbow x-ray shows no acute osseous abnormality 06/05/15  WBC 8.4 hemoglobin 10.6 hematocrit 31.9 MCV 80.4 platelet 88 sodium 145 potassium 3.7 glucose 110 BUN 32 creatinine 1.0 for total bilirubin 0.7 alkaline phosphatase 65 SGOT 18 SGPT 16 total protein 6.2 albumin 2.9 calcium 9.9 Basic Metabolic Panel:  Recent Labs  05/27/15 0400 05/27/15 1349 05/28/15 0315  06/01/15 1203 06/02/15 0612 06/03/15 0410 06/05/15  NA 137  --  137  < > 139 141 144 145  K 3.6  --  3.6  < > 3.7 3.3* 3.9 3.7  CL 104  --  103  < > 98* 102 101  --   CO2 26  --  27  < > 32 33* 30  --   GLUCOSE 140*  --  153*  < > 201* 101* 37*  --   BUN 9  --  9  < > 30* 31* 30* 32*  CREATININE 0.76  --  0.73  < > 0.95 0.96 1.03* 1.0  CALCIUM 9.4  --  9.9  < > 10.2 10.0 10.7*  --   MG 1.1* 1.7 1.7  --   --   --   --   --   < > = values in this interval not displayed. Liver Function Tests:  Recent Labs  09/07/14 1214 05/22/15 1310 06/05/15  AST 14 17 18   ALT 12 14 16   ALKPHOS 90 84 65  BILITOT 0.3 0.4  --   PROT 7.5 7.5  --   ALBUMIN 3.6 3.4*  --    CBC:  Recent Labs  11/21/14 1501  05/28/15 0315 05/30/15 0428 05/31/15 1020 06/05/15  WBC  8.3  < > 12.3* 12.2* 9.4 8.4  NEUTROABS 5.7  --   --   --  6.5  --   HGB 12.4  < > 11.3* 12.0 10.9* 10.6*  HCT 35.4*  < > 32.1* 34.2* 31.6* 32*  MCV 80.3  < > 77.5* 77.4* 78.4  --   PLT 306  < > 202 244 252 88*  < > = values in this interval not displayed.  Cardiac Enzymes:  Recent Labs  11/21/14 1501  TROPONINI <0.30   CBG:  Recent Labs  06/03/15 0517 06/03/15 0757 06/03/15 1129  GLUCAP 108* 118* 134*    ASSESSMENT/PLAN:  Physical deconditioning - for home health PT, OT, nursing and social worker   Severe aortic stenosis S/P TAVR - continue Plavix 75 mg 1 tab by mouth daily, Tylenol 500 mg 2 tabs = 1000 mg by mouth twice a day and tramadol 50 mg 1-2 tabs by mouth every 4 hours when necessary for pain  Anxiety - mood is stable; continue Xanax 0.25 mg take 1/2 tab = 0.125 mg by mouth  Daily and twice a day when necessary  Constipation - continue Senna S2 tabs by mouth twice a day, continue lactulose 30 g/45 ML by mouth daily when necessary, MiraLAX 17 g by mouth daily and Linzess 145 g 1 capsule by mouth daily when necessary  Diastolic CHF - continue Lasix 40 mg 1 tab by mouth daily; weight Q Mondays and  Wednesdays  Hypertension - continue hydralazine 50 mg 1 tab by mouth every 8 hours, lisinopril 10 mg 1 tab by mouth daily and Bystolic 10 mg 1 tab by mouth every morning  Diabetes mellitus, type II - hemoglobin A1c 8.0; insulin dosage was recently decreased; continue Levemir 30 units subcutaneous twice a day and Glucophage XR 500 mg 1 tab by mouth daily  Dementia - continue Namenda XR 28 mg 1 capsule by mouth every morning  GERD - continue Nexium 20 mg 24 HR 1 capsule by mouth daily  Hypokalemia - K 3.7; continue K Dur 20 meq by mouth daily; check BMP  Hyperlipidemia - continue Lipitor 80 mg 1 tab by mouth daily at bedtime  Anemia, acute blood loss - hemoglobin 10.6; stable  Protein-calorie malnutrition, severe - albumin 2.9; continue Procel 2 scoops twice a  day  Allergic rhinitis - continue Allegra 180 mg 1 tab by mouth daily      I have filled out patient's discharge paperwork and written prescriptions.  Patient will receive home health PT, OT, Nursing and Social worker.  DME provided:   semi-electric hospital bed, hoyer lift, standard wheelchair, cushion, leg rests and anti-tippers  Total discharge time: Greater  than 30 minutes  Discharge time involved coordination of the discharge process with social worker, nursing staff and therapy department. Medical justification for home health services/DME verified.    [facility], NP [employer] [phone]

## 2015-07-26 NOTE — Progress Notes (Signed)
This encounter was created in error - please disregard.

## 2015-07-31 ENCOUNTER — Other Ambulatory Visit: Payer: Self-pay | Admitting: Adult Health

## 2015-08-06 DIAGNOSIS — Z952 Presence of prosthetic heart valve: Secondary | ICD-10-CM | POA: Diagnosis not present

## 2015-08-06 DIAGNOSIS — E43 Unspecified severe protein-calorie malnutrition: Secondary | ICD-10-CM | POA: Diagnosis not present

## 2015-08-06 DIAGNOSIS — Z794 Long term (current) use of insulin: Secondary | ICD-10-CM | POA: Diagnosis not present

## 2015-08-06 DIAGNOSIS — L89322 Pressure ulcer of left buttock, stage 2: Secondary | ICD-10-CM | POA: Diagnosis not present

## 2015-08-06 DIAGNOSIS — F0390 Unspecified dementia without behavioral disturbance: Secondary | ICD-10-CM | POA: Diagnosis not present

## 2015-08-06 DIAGNOSIS — Z48812 Encounter for surgical aftercare following surgery on the circulatory system: Secondary | ICD-10-CM | POA: Diagnosis not present

## 2015-08-06 DIAGNOSIS — I5032 Chronic diastolic (congestive) heart failure: Secondary | ICD-10-CM | POA: Diagnosis not present

## 2015-08-06 DIAGNOSIS — I10 Essential (primary) hypertension: Secondary | ICD-10-CM | POA: Diagnosis not present

## 2015-08-06 DIAGNOSIS — E119 Type 2 diabetes mellitus without complications: Secondary | ICD-10-CM | POA: Diagnosis not present

## 2015-08-06 DIAGNOSIS — F419 Anxiety disorder, unspecified: Secondary | ICD-10-CM | POA: Diagnosis not present

## 2015-08-07 DIAGNOSIS — Z48812 Encounter for surgical aftercare following surgery on the circulatory system: Secondary | ICD-10-CM | POA: Diagnosis not present

## 2015-08-07 DIAGNOSIS — F0390 Unspecified dementia without behavioral disturbance: Secondary | ICD-10-CM | POA: Diagnosis not present

## 2015-08-07 DIAGNOSIS — I5032 Chronic diastolic (congestive) heart failure: Secondary | ICD-10-CM | POA: Diagnosis not present

## 2015-08-07 DIAGNOSIS — I10 Essential (primary) hypertension: Secondary | ICD-10-CM | POA: Diagnosis not present

## 2015-08-07 DIAGNOSIS — E119 Type 2 diabetes mellitus without complications: Secondary | ICD-10-CM | POA: Diagnosis not present

## 2015-08-07 DIAGNOSIS — L89322 Pressure ulcer of left buttock, stage 2: Secondary | ICD-10-CM | POA: Diagnosis not present

## 2015-08-08 DIAGNOSIS — F0390 Unspecified dementia without behavioral disturbance: Secondary | ICD-10-CM | POA: Diagnosis not present

## 2015-08-08 DIAGNOSIS — Z48812 Encounter for surgical aftercare following surgery on the circulatory system: Secondary | ICD-10-CM | POA: Diagnosis not present

## 2015-08-08 DIAGNOSIS — E119 Type 2 diabetes mellitus without complications: Secondary | ICD-10-CM | POA: Diagnosis not present

## 2015-08-08 DIAGNOSIS — I5032 Chronic diastolic (congestive) heart failure: Secondary | ICD-10-CM | POA: Diagnosis not present

## 2015-08-08 DIAGNOSIS — L89322 Pressure ulcer of left buttock, stage 2: Secondary | ICD-10-CM | POA: Diagnosis not present

## 2015-08-08 DIAGNOSIS — I10 Essential (primary) hypertension: Secondary | ICD-10-CM | POA: Diagnosis not present

## 2015-08-10 DIAGNOSIS — L89322 Pressure ulcer of left buttock, stage 2: Secondary | ICD-10-CM | POA: Diagnosis not present

## 2015-08-10 DIAGNOSIS — I5032 Chronic diastolic (congestive) heart failure: Secondary | ICD-10-CM | POA: Diagnosis not present

## 2015-08-10 DIAGNOSIS — I10 Essential (primary) hypertension: Secondary | ICD-10-CM | POA: Diagnosis not present

## 2015-08-10 DIAGNOSIS — E119 Type 2 diabetes mellitus without complications: Secondary | ICD-10-CM | POA: Diagnosis not present

## 2015-08-10 DIAGNOSIS — Z48812 Encounter for surgical aftercare following surgery on the circulatory system: Secondary | ICD-10-CM | POA: Diagnosis not present

## 2015-08-10 DIAGNOSIS — F0390 Unspecified dementia without behavioral disturbance: Secondary | ICD-10-CM | POA: Diagnosis not present

## 2015-08-11 DIAGNOSIS — Z48812 Encounter for surgical aftercare following surgery on the circulatory system: Secondary | ICD-10-CM | POA: Diagnosis not present

## 2015-08-11 DIAGNOSIS — L89322 Pressure ulcer of left buttock, stage 2: Secondary | ICD-10-CM | POA: Diagnosis not present

## 2015-08-11 DIAGNOSIS — I5032 Chronic diastolic (congestive) heart failure: Secondary | ICD-10-CM | POA: Diagnosis not present

## 2015-08-11 DIAGNOSIS — E119 Type 2 diabetes mellitus without complications: Secondary | ICD-10-CM | POA: Diagnosis not present

## 2015-08-11 DIAGNOSIS — I10 Essential (primary) hypertension: Secondary | ICD-10-CM | POA: Diagnosis not present

## 2015-08-11 DIAGNOSIS — F0390 Unspecified dementia without behavioral disturbance: Secondary | ICD-10-CM | POA: Diagnosis not present

## 2015-08-12 DIAGNOSIS — E119 Type 2 diabetes mellitus without complications: Secondary | ICD-10-CM | POA: Diagnosis not present

## 2015-08-12 DIAGNOSIS — I10 Essential (primary) hypertension: Secondary | ICD-10-CM | POA: Diagnosis not present

## 2015-08-12 DIAGNOSIS — Z48812 Encounter for surgical aftercare following surgery on the circulatory system: Secondary | ICD-10-CM | POA: Diagnosis not present

## 2015-08-12 DIAGNOSIS — I5032 Chronic diastolic (congestive) heart failure: Secondary | ICD-10-CM | POA: Diagnosis not present

## 2015-08-12 DIAGNOSIS — L89322 Pressure ulcer of left buttock, stage 2: Secondary | ICD-10-CM | POA: Diagnosis not present

## 2015-08-12 DIAGNOSIS — F0390 Unspecified dementia without behavioral disturbance: Secondary | ICD-10-CM | POA: Diagnosis not present

## 2015-08-13 DIAGNOSIS — I5042 Chronic combined systolic (congestive) and diastolic (congestive) heart failure: Secondary | ICD-10-CM | POA: Diagnosis not present

## 2015-08-13 DIAGNOSIS — R609 Edema, unspecified: Secondary | ICD-10-CM | POA: Diagnosis not present

## 2015-08-13 DIAGNOSIS — E119 Type 2 diabetes mellitus without complications: Secondary | ICD-10-CM | POA: Diagnosis not present

## 2015-08-13 DIAGNOSIS — D649 Anemia, unspecified: Secondary | ICD-10-CM | POA: Diagnosis not present

## 2015-08-13 DIAGNOSIS — F0390 Unspecified dementia without behavioral disturbance: Secondary | ICD-10-CM | POA: Diagnosis not present

## 2015-08-14 DIAGNOSIS — I5032 Chronic diastolic (congestive) heart failure: Secondary | ICD-10-CM | POA: Diagnosis not present

## 2015-08-14 DIAGNOSIS — E119 Type 2 diabetes mellitus without complications: Secondary | ICD-10-CM | POA: Diagnosis not present

## 2015-08-14 DIAGNOSIS — L89322 Pressure ulcer of left buttock, stage 2: Secondary | ICD-10-CM | POA: Diagnosis not present

## 2015-08-14 DIAGNOSIS — F0390 Unspecified dementia without behavioral disturbance: Secondary | ICD-10-CM | POA: Diagnosis not present

## 2015-08-14 DIAGNOSIS — Z48812 Encounter for surgical aftercare following surgery on the circulatory system: Secondary | ICD-10-CM | POA: Diagnosis not present

## 2015-08-14 DIAGNOSIS — I10 Essential (primary) hypertension: Secondary | ICD-10-CM | POA: Diagnosis not present

## 2015-08-17 DIAGNOSIS — Z48812 Encounter for surgical aftercare following surgery on the circulatory system: Secondary | ICD-10-CM | POA: Diagnosis not present

## 2015-08-17 DIAGNOSIS — F0390 Unspecified dementia without behavioral disturbance: Secondary | ICD-10-CM | POA: Diagnosis not present

## 2015-08-17 DIAGNOSIS — E119 Type 2 diabetes mellitus without complications: Secondary | ICD-10-CM | POA: Diagnosis not present

## 2015-08-17 DIAGNOSIS — I5032 Chronic diastolic (congestive) heart failure: Secondary | ICD-10-CM | POA: Diagnosis not present

## 2015-08-17 DIAGNOSIS — I10 Essential (primary) hypertension: Secondary | ICD-10-CM | POA: Diagnosis not present

## 2015-08-17 DIAGNOSIS — L89322 Pressure ulcer of left buttock, stage 2: Secondary | ICD-10-CM | POA: Diagnosis not present

## 2015-08-18 DIAGNOSIS — Z48812 Encounter for surgical aftercare following surgery on the circulatory system: Secondary | ICD-10-CM | POA: Diagnosis not present

## 2015-08-18 DIAGNOSIS — L89322 Pressure ulcer of left buttock, stage 2: Secondary | ICD-10-CM | POA: Diagnosis not present

## 2015-08-18 DIAGNOSIS — E119 Type 2 diabetes mellitus without complications: Secondary | ICD-10-CM | POA: Diagnosis not present

## 2015-08-18 DIAGNOSIS — F0390 Unspecified dementia without behavioral disturbance: Secondary | ICD-10-CM | POA: Diagnosis not present

## 2015-08-18 DIAGNOSIS — I5032 Chronic diastolic (congestive) heart failure: Secondary | ICD-10-CM | POA: Diagnosis not present

## 2015-08-18 DIAGNOSIS — I10 Essential (primary) hypertension: Secondary | ICD-10-CM | POA: Diagnosis not present

## 2015-08-19 DIAGNOSIS — I10 Essential (primary) hypertension: Secondary | ICD-10-CM | POA: Diagnosis not present

## 2015-08-19 DIAGNOSIS — F0390 Unspecified dementia without behavioral disturbance: Secondary | ICD-10-CM | POA: Diagnosis not present

## 2015-08-19 DIAGNOSIS — I5032 Chronic diastolic (congestive) heart failure: Secondary | ICD-10-CM | POA: Diagnosis not present

## 2015-08-19 DIAGNOSIS — L89322 Pressure ulcer of left buttock, stage 2: Secondary | ICD-10-CM | POA: Diagnosis not present

## 2015-08-19 DIAGNOSIS — E119 Type 2 diabetes mellitus without complications: Secondary | ICD-10-CM | POA: Diagnosis not present

## 2015-08-19 DIAGNOSIS — Z48812 Encounter for surgical aftercare following surgery on the circulatory system: Secondary | ICD-10-CM | POA: Diagnosis not present

## 2015-08-20 DIAGNOSIS — Z48812 Encounter for surgical aftercare following surgery on the circulatory system: Secondary | ICD-10-CM | POA: Diagnosis not present

## 2015-08-20 DIAGNOSIS — L89322 Pressure ulcer of left buttock, stage 2: Secondary | ICD-10-CM | POA: Diagnosis not present

## 2015-08-20 DIAGNOSIS — F0390 Unspecified dementia without behavioral disturbance: Secondary | ICD-10-CM | POA: Diagnosis not present

## 2015-08-20 DIAGNOSIS — E119 Type 2 diabetes mellitus without complications: Secondary | ICD-10-CM | POA: Diagnosis not present

## 2015-08-20 DIAGNOSIS — I5032 Chronic diastolic (congestive) heart failure: Secondary | ICD-10-CM | POA: Diagnosis not present

## 2015-08-20 DIAGNOSIS — I10 Essential (primary) hypertension: Secondary | ICD-10-CM | POA: Diagnosis not present

## 2015-08-21 DIAGNOSIS — I10 Essential (primary) hypertension: Secondary | ICD-10-CM | POA: Diagnosis not present

## 2015-08-21 DIAGNOSIS — F0390 Unspecified dementia without behavioral disturbance: Secondary | ICD-10-CM | POA: Diagnosis not present

## 2015-08-21 DIAGNOSIS — Z48812 Encounter for surgical aftercare following surgery on the circulatory system: Secondary | ICD-10-CM | POA: Diagnosis not present

## 2015-08-21 DIAGNOSIS — I5032 Chronic diastolic (congestive) heart failure: Secondary | ICD-10-CM | POA: Diagnosis not present

## 2015-08-21 DIAGNOSIS — E119 Type 2 diabetes mellitus without complications: Secondary | ICD-10-CM | POA: Diagnosis not present

## 2015-08-21 DIAGNOSIS — L89322 Pressure ulcer of left buttock, stage 2: Secondary | ICD-10-CM | POA: Diagnosis not present

## 2015-08-24 DIAGNOSIS — L89322 Pressure ulcer of left buttock, stage 2: Secondary | ICD-10-CM | POA: Diagnosis not present

## 2015-08-24 DIAGNOSIS — E119 Type 2 diabetes mellitus without complications: Secondary | ICD-10-CM | POA: Diagnosis not present

## 2015-08-24 DIAGNOSIS — Z48812 Encounter for surgical aftercare following surgery on the circulatory system: Secondary | ICD-10-CM | POA: Diagnosis not present

## 2015-08-24 DIAGNOSIS — I5032 Chronic diastolic (congestive) heart failure: Secondary | ICD-10-CM | POA: Diagnosis not present

## 2015-08-24 DIAGNOSIS — I10 Essential (primary) hypertension: Secondary | ICD-10-CM | POA: Diagnosis not present

## 2015-08-24 DIAGNOSIS — F0390 Unspecified dementia without behavioral disturbance: Secondary | ICD-10-CM | POA: Diagnosis not present

## 2015-08-25 DIAGNOSIS — F0390 Unspecified dementia without behavioral disturbance: Secondary | ICD-10-CM | POA: Diagnosis not present

## 2015-08-25 DIAGNOSIS — I10 Essential (primary) hypertension: Secondary | ICD-10-CM | POA: Diagnosis not present

## 2015-08-25 DIAGNOSIS — Z48812 Encounter for surgical aftercare following surgery on the circulatory system: Secondary | ICD-10-CM | POA: Diagnosis not present

## 2015-08-25 DIAGNOSIS — I5032 Chronic diastolic (congestive) heart failure: Secondary | ICD-10-CM | POA: Diagnosis not present

## 2015-08-25 DIAGNOSIS — E119 Type 2 diabetes mellitus without complications: Secondary | ICD-10-CM | POA: Diagnosis not present

## 2015-08-25 DIAGNOSIS — L89322 Pressure ulcer of left buttock, stage 2: Secondary | ICD-10-CM | POA: Diagnosis not present

## 2015-08-26 DIAGNOSIS — I10 Essential (primary) hypertension: Secondary | ICD-10-CM | POA: Diagnosis not present

## 2015-08-26 DIAGNOSIS — F0390 Unspecified dementia without behavioral disturbance: Secondary | ICD-10-CM | POA: Diagnosis not present

## 2015-08-26 DIAGNOSIS — L89322 Pressure ulcer of left buttock, stage 2: Secondary | ICD-10-CM | POA: Diagnosis not present

## 2015-08-26 DIAGNOSIS — Z48812 Encounter for surgical aftercare following surgery on the circulatory system: Secondary | ICD-10-CM | POA: Diagnosis not present

## 2015-08-26 DIAGNOSIS — I5032 Chronic diastolic (congestive) heart failure: Secondary | ICD-10-CM | POA: Diagnosis not present

## 2015-08-26 DIAGNOSIS — E119 Type 2 diabetes mellitus without complications: Secondary | ICD-10-CM | POA: Diagnosis not present

## 2015-08-27 DIAGNOSIS — I10 Essential (primary) hypertension: Secondary | ICD-10-CM | POA: Diagnosis not present

## 2015-08-27 DIAGNOSIS — F0390 Unspecified dementia without behavioral disturbance: Secondary | ICD-10-CM | POA: Diagnosis not present

## 2015-08-27 DIAGNOSIS — E119 Type 2 diabetes mellitus without complications: Secondary | ICD-10-CM | POA: Diagnosis not present

## 2015-08-27 DIAGNOSIS — Z48812 Encounter for surgical aftercare following surgery on the circulatory system: Secondary | ICD-10-CM | POA: Diagnosis not present

## 2015-08-27 DIAGNOSIS — I5032 Chronic diastolic (congestive) heart failure: Secondary | ICD-10-CM | POA: Diagnosis not present

## 2015-08-27 DIAGNOSIS — L89322 Pressure ulcer of left buttock, stage 2: Secondary | ICD-10-CM | POA: Diagnosis not present

## 2015-08-28 DIAGNOSIS — I10 Essential (primary) hypertension: Secondary | ICD-10-CM | POA: Diagnosis not present

## 2015-08-28 DIAGNOSIS — I5032 Chronic diastolic (congestive) heart failure: Secondary | ICD-10-CM | POA: Diagnosis not present

## 2015-08-28 DIAGNOSIS — L89322 Pressure ulcer of left buttock, stage 2: Secondary | ICD-10-CM | POA: Diagnosis not present

## 2015-08-28 DIAGNOSIS — E119 Type 2 diabetes mellitus without complications: Secondary | ICD-10-CM | POA: Diagnosis not present

## 2015-08-28 DIAGNOSIS — Z48812 Encounter for surgical aftercare following surgery on the circulatory system: Secondary | ICD-10-CM | POA: Diagnosis not present

## 2015-08-28 DIAGNOSIS — F0390 Unspecified dementia without behavioral disturbance: Secondary | ICD-10-CM | POA: Diagnosis not present

## 2015-08-29 ENCOUNTER — Other Ambulatory Visit: Payer: Self-pay | Admitting: Adult Health

## 2015-08-31 DIAGNOSIS — Z23 Encounter for immunization: Secondary | ICD-10-CM | POA: Diagnosis not present

## 2015-08-31 DIAGNOSIS — I5042 Chronic combined systolic (congestive) and diastolic (congestive) heart failure: Secondary | ICD-10-CM | POA: Diagnosis not present

## 2015-08-31 DIAGNOSIS — E119 Type 2 diabetes mellitus without complications: Secondary | ICD-10-CM | POA: Diagnosis not present

## 2015-08-31 DIAGNOSIS — D509 Iron deficiency anemia, unspecified: Secondary | ICD-10-CM | POA: Diagnosis not present

## 2015-08-31 DIAGNOSIS — R3981 Functional urinary incontinence: Secondary | ICD-10-CM | POA: Diagnosis not present

## 2015-08-31 DIAGNOSIS — F0390 Unspecified dementia without behavioral disturbance: Secondary | ICD-10-CM | POA: Diagnosis not present

## 2015-09-01 DIAGNOSIS — I10 Essential (primary) hypertension: Secondary | ICD-10-CM | POA: Diagnosis not present

## 2015-09-01 DIAGNOSIS — E119 Type 2 diabetes mellitus without complications: Secondary | ICD-10-CM | POA: Diagnosis not present

## 2015-09-01 DIAGNOSIS — Z48812 Encounter for surgical aftercare following surgery on the circulatory system: Secondary | ICD-10-CM | POA: Diagnosis not present

## 2015-09-01 DIAGNOSIS — F0390 Unspecified dementia without behavioral disturbance: Secondary | ICD-10-CM | POA: Diagnosis not present

## 2015-09-01 DIAGNOSIS — L89322 Pressure ulcer of left buttock, stage 2: Secondary | ICD-10-CM | POA: Diagnosis not present

## 2015-09-01 DIAGNOSIS — I5032 Chronic diastolic (congestive) heart failure: Secondary | ICD-10-CM | POA: Diagnosis not present

## 2015-09-02 ENCOUNTER — Encounter: Payer: Self-pay | Admitting: Internal Medicine

## 2015-09-02 ENCOUNTER — Ambulatory Visit (INDEPENDENT_AMBULATORY_CARE_PROVIDER_SITE_OTHER): Payer: Medicare Other | Admitting: Internal Medicine

## 2015-09-02 ENCOUNTER — Ambulatory Visit (INDEPENDENT_AMBULATORY_CARE_PROVIDER_SITE_OTHER): Payer: Medicare Other | Admitting: Podiatry

## 2015-09-02 ENCOUNTER — Encounter: Payer: Self-pay | Admitting: Podiatry

## 2015-09-02 VITALS — BP 110/60 | HR 71 | Ht 62.0 in | Wt 211.0 lb

## 2015-09-02 DIAGNOSIS — Z954 Presence of other heart-valve replacement: Secondary | ICD-10-CM

## 2015-09-02 DIAGNOSIS — B351 Tinea unguium: Secondary | ICD-10-CM

## 2015-09-02 DIAGNOSIS — I5030 Unspecified diastolic (congestive) heart failure: Secondary | ICD-10-CM | POA: Diagnosis not present

## 2015-09-02 DIAGNOSIS — M79676 Pain in unspecified toe(s): Secondary | ICD-10-CM | POA: Diagnosis not present

## 2015-09-02 DIAGNOSIS — I2510 Atherosclerotic heart disease of native coronary artery without angina pectoris: Secondary | ICD-10-CM | POA: Diagnosis not present

## 2015-09-02 DIAGNOSIS — Z951 Presence of aortocoronary bypass graft: Secondary | ICD-10-CM | POA: Diagnosis not present

## 2015-09-02 DIAGNOSIS — E1140 Type 2 diabetes mellitus with diabetic neuropathy, unspecified: Secondary | ICD-10-CM | POA: Diagnosis not present

## 2015-09-02 DIAGNOSIS — Z953 Presence of xenogenic heart valve: Secondary | ICD-10-CM | POA: Insufficient documentation

## 2015-09-02 DIAGNOSIS — I350 Nonrheumatic aortic (valve) stenosis: Secondary | ICD-10-CM

## 2015-09-02 DIAGNOSIS — Z952 Presence of prosthetic heart valve: Secondary | ICD-10-CM

## 2015-09-02 NOTE — Progress Notes (Signed)
Patient ID: Katherine [name], female   DOB: [birth year], 81 y.o.   MRN: [id number] Complaint:  Visit Type: Patient returns to my office for continued preventative foot care services. Complaint: Patient states" my nails have grown long and thick and become painful to walk and wear shoes" Patient has been diagnosed with DM with no foot complications. The patient presents for preventative foot care services. No changes to ROS  Podiatric Exam: Vascular: dorsalis pedis and posterior tibial pulses are palpable bilateral. Capillary return is immediate. Temperature gradient is WNL. Skin turgor WNL  Sensorium: Diminished Semmes Weinstein monofilament test. Normal tactile sensation bilaterally. Nail Exam: Pt has thick disfigured discolored nails with subungual debris noted bilateral entire nail hallux through fifth toenails Ulcer Exam: There is no evidence of ulcer or pre-ulcerative changes or infection. Orthopedic Exam: Muscle tone and strength are WNL. No limitations in general ROM. No crepitus or effusions noted. Foot type and digits show no abnormalities. Bony prominences are unremarkable. HAV deformity B/LSkin: No Porokeratosis. No infection or ulcers  Diagnosis:  Onychomycosis, , Pain in right toe, pain in left toes  Treatment & Plan Procedures and Treatment: Consent by patient was obtained for treatment procedures. The patient understood the discussion of treatment and procedures well. All questions were answered thoroughly reviewed. Debridement of mycotic and hypertrophic toenails, 1 through 5 bilateral and clearing of subungual debris. No ulceration, no infection noted.  Return Visit-Office Procedure: Patient instructed to return to the office for a follow up visit 3 months for continued evaluation and treatment.

## 2015-09-02 NOTE — Patient Instructions (Signed)
Your physician wants you to follow-up in: 6 months with Dr. Hilty. You will receive a reminder letter in the mail two months in advance. If you don't receive a letter, please call our office to schedule the follow-up appointment.    

## 2015-09-02 NOTE — Progress Notes (Signed)
OFFICE NOTE  Chief Complaint:  Follow-up TAVR  Primary Care Physician: Wende Neighbors, MD  HPI:  Katherine [name] is an 81 year old female with a history of coronary disease status post CABG in 2003, morbid obesity, hypertension, dyslipidemia, and diabetes type 2. She had valvular aortic stenosis with a sign of increased gradient on her last echo in November. We repeated that echo on October 22, 2012 which showed actually no significant change in her aortic valve gradient with a valve area of about 1.2 cm, and peak and mean gradients of 45 and 24 mmHg. This is similar to findings in 2012. I suspect that we may have had an incomplete envelop and that her true gradients are closer to 50 and 30 mmHg. However, this would still yield moderate to severe aortic stenosis. Overall though she is asymptomatic at this point and there is no clear recommendation for surgery or procedures at this time. He reports that recently she had a urinary tract infection which is associated with some confusion. Ultimately this is treated and she rebounded quite quickly. She denies any chest pain or worsening shortness of breath. She has had no syncopal episodes. She is stated in the past but she is not interested in a repeat open heart surgical procedure, nor do I think she is a good candidate for that.  She returns today for followup with a repeat echo. This shows fairly stable and gradients and again moderate to severe aortic stenosis with a valve area of about 1-1.1 cm. She has reported a slight increase in lower extremity swelling but also for the past 2 weeks has awakened in the morning with chest pain. The chest discomfort is located over the left anterior chest wall and is somewhat worse with movement and position. It is not necessarily associated with exertion, however she no longer has to walk up stairs as she moved her washing machine and dryer up to the first floor. Her last stress test was in 2011 and was  nonischemic.  [name] returns today for follow-up. She was recently seen by Cecilie Kicks, FNP, for lower extremity swelling and shortness of breath. She had her Lasix dose increased to 40 mg daily. A chest x-ray was performed which shows cardiomegaly without overt heart failure. BNP was elevated at 655.  A repeat echocardiogram was performed which shows an EF of 65-70%. There is now moderate to more likely severe aortic stenosis with a mean gradient of 34 mmHg and a peak gradient of 59 mmHg. She reports today that there is been mild improvement in her shortness of breath however her weight remains fairly stable. Her leg edema has improved somewhat.  [name] returns today for follow-up. She reports her swelling is improved significantly. Unfortunate she's developed a chest cold. She was advised to use Mucinex to loosen it up. She has had aggressive shortness of breath and easy fatigability. As mentioned above she is close to severe aortic stenosis. We discussed several options including TAVR today, which I think would be the best alternative for her. I would like to repeat her echocardiogram in 5-6 months and see her back at that time. If her mean gradient has increased some, I will refer her to the valve clinic.   I had the pleasure seeing [name] back in the office today for follow-up. She was supposed to have an echocardiogram which we were going to review, however that has not yet been performed. She reports stable breathlessness but no chest pain.  She's had no syncope. Unfortunately she's gained more weight.  I saw [name] back in the office today. She recently underwent TAVR replacement of her aortic valve. She seems to be doing much better from a cardiovascular standpoint. She's currently on Lasix 40 mg twice a day due to some lower extremity edema. She seemed to have a very protracted recovery and was a candidate place for about 2 months. Overall though she is breathing better and  seems to have a little more stamina. As previously mentioned her aortic valve gradient is very low and post procedure echo shows normal function.  PMHx:  Past Medical History  Diagnosis Date  . Gastric mass   . Hernia of unspecified site of abdominal cavity without mention of obstruction or gangrene     hiatal  . Gastric ulcer   . Thyroid mass   . GERD (gastroesophageal reflux disease)   . Anxiety   . Coronary artery disease   . Osteoarthritis   . Anemia   . Hypertension   . Hyperlipidemia   . Diabetes mellitus, type 2   . Aortic valvular stenosis     moderate to severe  EF on 2D Echo 10/22/2012 65-70%  valve area of 1.2cm, and peak and mean gradients of 45 and 23mmHg  . CHF (congestive heart failure)   . Dyspnea     chronic  . Easy fatigability   . Normal cardiac stress test 12/2013    normal stress nuclear study  . Heart murmur   . Chalazion of right upper eyelid early June 2016    treating with cream, Rx by Dr. Merlyn Albert      Past Surgical History  Procedure Laterality Date  . Coronary artery bypass graft      x4 in 2003  . Tubal ligation    . Knee surgery Right   . Uterine polypectomy    . Internal hemorrhoidectomy    . Cataract extraction Bilateral   . Left and right heart catheterization with coronary/graft angiogram N/A 03/24/2015    Procedure: LEFT AND RIGHT HEART CATHETERIZATION WITH Beatrix Fetters;  Surgeon: Burnell Blanks, MD;  Location: [facility] CATH LAB;  Service: Cardiovascular;  Laterality: N/A;  . Transcatheter aortic valve replacement, transfemoral N/A 05/26/2015    Procedure: TRANSCATHETER AORTIC VALVE REPLACEMENT, TRANSFEMORAL;  Surgeon: Burnell Blanks, MD;  Location: [facility];  Service: Open Heart Surgery;  Laterality: N/A;  . Tee without cardioversion N/A 05/26/2015    Procedure: TRANSESOPHAGEAL ECHOCARDIOGRAM (TEE);  Surgeon: Burnell Blanks, MD;  Location: [place];  Service: Open Heart Surgery;  Laterality: N/A;    FAMHx:  Family  History  Problem Relation Age of Onset  . Colon cancer Neg Hx   . Diabetes Mother   . Heart disease Mother   . Stroke Father   . Stroke Mother   . Heart attack Brother   . Cerebral aneurysm Mother     SOCHx:   reports that she has never smoked. She has never used smokeless tobacco. She reports that she does not drink alcohol or use illicit drugs.  ALLERGIES:  No Known Allergies  ROS: A comprehensive review of systems was negative except for: Respiratory: positive for cough and dyspnea on exertion Cardiovascular: positive for lower extremity edema  HOME MEDS: Current Outpatient Prescriptions  Medication Sig Dispense Refill  . acetaminophen (TYLENOL) 500 MG tablet Take two tablets by mouth twice daily for pain. Do not exceed 4gm of Tylenol in 24 hours    .  ALPRAZolam (XANAX) 0.25 MG tablet Take 0.5 tablets (0.125 mg total) by mouth 2 (two) times daily as needed for anxiety or sleep. (Patient taking differently: Take 1/2 tablet by mouth twice daily as needed for anxiety or sleep; Take 1/2 tablet by mouth once daily for anxiety) 10 tablet 0  . atorvastatin (LIPITOR) 80 MG tablet Take one tablet by mouth once daily for cholesterol    . clopidogrel (PLAVIX) 75 MG tablet Take 1 tablet (75 mg total) by mouth daily with breakfast. 30 tablet 11  . fexofenadine (ALLEGRA) 180 MG tablet Take 180 mg by mouth daily.    . furosemide (LASIX) 40 MG tablet Take 40 mg by mouth 2 (two) times daily.    Marland Kitchen guaiFENesin (MUCINEX) 600 MG 12 hr tablet Take 1 tablet (600 mg total) by mouth 2 (two) times daily. (Patient taking differently: Take one tablet by mouth twice daily for acute pulmonary edema. Do not crush)    . hydrALAZINE (APRESOLINE) 50 MG tablet Take 1 tablet (50 mg total) by mouth every 8 (eight) hours. (Patient taking differently: Take one tablet by mouth every eight hours for blood pressure) 90 tablet 11  . insulin detemir (LEVEMIR) 100 UNIT/ML injection Inject 0.3 mLs (30 Units total) into the  skin 2 (two) times daily. (Patient taking differently: Inject 30 units subcutaneously twice daily to control blood sugar) 10 mL 11  . Ipratropium-Albuterol (COMBIVENT RESPIMAT) 20-100 MCG/ACT AERS respimat Inhale 2 puffs into the lungs every 6 (six) hours as needed for wheezing or shortness of breath. 1 Inhaler 2  . lactulose (CHRONULAC) 10 GM/15ML solution Take 45 mLs (30 g total) by mouth daily as needed for mild constipation or severe constipation. 240 mL 0  . Linaclotide (LINZESS) 145 MCG CAPS capsule Take 145 mcg by mouth daily as needed (for constipation).    Marland Kitchen lisinopril (PRINIVIL,ZESTRIL) 10 MG tablet Take 1 tablet (10 mg total) by mouth daily. (Patient taking differently: Take one tablet by mouth once daily for blood pressure) 30 tablet 11  . loperamide (IMODIUM) 2 MG capsule Take 2 mg by mouth as needed for diarrhea or loose stools.    . Memantine HCl ER (NAMENDA XR) 28 MG CP24 Take one capsule by mouth every morning for dementia    . metFORMIN (GLUCOPHAGE-XR) 500 MG 24 hr tablet Take one tablet by mouth once daily with breakfast to control blood sugar. Do not crush    . nebivolol (BYSTOLIC) 10 MG tablet Take one tablet by mouth every morning for blood pressure    . NEXIUM 20 MG capsule Take one capsule by mouth once daily for reflux    . polyethylene glycol (MIRALAX / GLYCOLAX) packet Take 17 g by mouth daily. (Patient taking differently: Mix 17gm in 4-8 oz of liquid and take daily to prevent constipation) 14 each 0  . potassium chloride SA (K-DUR,KLOR-CON) 20 MEQ tablet Take 20 mEq by mouth daily.    . Protein (PROCEL) POWD Administer two scoops by mouth twice daily for nutritional support    . sennosides-docusate sodium (SENOKOT-S) 8.6-50 MG tablet Take two tablets by mouth twice daily    . traMADol (ULTRAM) 50 MG tablet Take 1-2 tablets (50-100 mg total) by mouth every 4 (four) hours as needed for moderate pain. (Patient taking differently: Take one tablet by mouth every 4 hours as needed  for mild to moderate pain; Take two tablets by mouth every 4 hours as needed for severe pain.) 20 tablet 0  . VOLTAREN 1 % GEL  Apply 2 g topically daily as needed (pain).      No current facility-administered medications for this visit.    LABS/IMAGING: No results found for this or any previous visit (from the past 48 hour(s)). No results found.  VITALS: BP 110/60 mmHg  Pulse 71  Ht 5\' 2"  (1.575 m)  Wt 211 lb (95.709 kg)  BMI 38.58 kg/m2  EXAM: GEN: Awake, NAD HEENT: PERRLA, EOMI Neck: Thick neck, unable to assess JVP Lungs: Decreased breath sounds bilaterally, no rales, rhonchorous cough  Cardiovascular: Regular rate and rhythm, normal S1, no clear S2, 1/6 SEM, early peaking Abdomen: Morbid obese, soft, nontender Extremity: Trace to 1+ bilateral pitting edema Neurologic: Grossly nonfocal Psych: Pleasant mood, normal affect  EKG: Normal sinus rhythm at 90, voltage criteria for LVH  ASSESSMENT: 1. Chest pain - resolved, low risk NST 01/2014 2. Severe aortic stenosis - s/p TAVR (06/2015) 3. Morbid obesity 4. History of coronary artery disease status post CABG in 2003 5. Hypertension - repeat BP was 162/80 6. Dyslipidemia 7. Diabetes type 2  PLAN: 1.   Ms. Calcutt seems to be recovering from recent TAVR surgery. Her aortic stenosis has now resolved. She had a protracted recovery but seems to be improving. Overall thinks she is doing well we'll plan to see her back in about 6 months.  [name], MD, [facility] Attending Cardiologist Holley C Hilty 09/02/2015, 5:37 PM

## 2015-09-03 ENCOUNTER — Encounter: Payer: Self-pay | Admitting: Internal Medicine

## 2015-09-03 DIAGNOSIS — F0390 Unspecified dementia without behavioral disturbance: Secondary | ICD-10-CM | POA: Diagnosis not present

## 2015-09-03 DIAGNOSIS — E119 Type 2 diabetes mellitus without complications: Secondary | ICD-10-CM | POA: Diagnosis not present

## 2015-09-03 DIAGNOSIS — L89322 Pressure ulcer of left buttock, stage 2: Secondary | ICD-10-CM | POA: Diagnosis not present

## 2015-09-03 DIAGNOSIS — I10 Essential (primary) hypertension: Secondary | ICD-10-CM | POA: Diagnosis not present

## 2015-09-03 DIAGNOSIS — I5032 Chronic diastolic (congestive) heart failure: Secondary | ICD-10-CM | POA: Diagnosis not present

## 2015-09-03 DIAGNOSIS — Z48812 Encounter for surgical aftercare following surgery on the circulatory system: Secondary | ICD-10-CM | POA: Diagnosis not present

## 2015-09-04 DIAGNOSIS — L89322 Pressure ulcer of left buttock, stage 2: Secondary | ICD-10-CM | POA: Diagnosis not present

## 2015-09-04 DIAGNOSIS — I5032 Chronic diastolic (congestive) heart failure: Secondary | ICD-10-CM | POA: Diagnosis not present

## 2015-09-04 DIAGNOSIS — F0390 Unspecified dementia without behavioral disturbance: Secondary | ICD-10-CM | POA: Diagnosis not present

## 2015-09-04 DIAGNOSIS — I10 Essential (primary) hypertension: Secondary | ICD-10-CM | POA: Diagnosis not present

## 2015-09-04 DIAGNOSIS — Z48812 Encounter for surgical aftercare following surgery on the circulatory system: Secondary | ICD-10-CM | POA: Diagnosis not present

## 2015-09-04 DIAGNOSIS — E119 Type 2 diabetes mellitus without complications: Secondary | ICD-10-CM | POA: Diagnosis not present

## 2015-09-07 DIAGNOSIS — Z48812 Encounter for surgical aftercare following surgery on the circulatory system: Secondary | ICD-10-CM | POA: Diagnosis not present

## 2015-09-07 DIAGNOSIS — I5032 Chronic diastolic (congestive) heart failure: Secondary | ICD-10-CM | POA: Diagnosis not present

## 2015-09-07 DIAGNOSIS — E119 Type 2 diabetes mellitus without complications: Secondary | ICD-10-CM | POA: Diagnosis not present

## 2015-09-07 DIAGNOSIS — F0390 Unspecified dementia without behavioral disturbance: Secondary | ICD-10-CM | POA: Diagnosis not present

## 2015-09-07 DIAGNOSIS — I10 Essential (primary) hypertension: Secondary | ICD-10-CM | POA: Diagnosis not present

## 2015-09-07 DIAGNOSIS — L89322 Pressure ulcer of left buttock, stage 2: Secondary | ICD-10-CM | POA: Diagnosis not present

## 2015-09-08 DIAGNOSIS — F0390 Unspecified dementia without behavioral disturbance: Secondary | ICD-10-CM | POA: Diagnosis not present

## 2015-09-08 DIAGNOSIS — I5032 Chronic diastolic (congestive) heart failure: Secondary | ICD-10-CM | POA: Diagnosis not present

## 2015-09-08 DIAGNOSIS — Z48812 Encounter for surgical aftercare following surgery on the circulatory system: Secondary | ICD-10-CM | POA: Diagnosis not present

## 2015-09-08 DIAGNOSIS — E119 Type 2 diabetes mellitus without complications: Secondary | ICD-10-CM | POA: Diagnosis not present

## 2015-09-08 DIAGNOSIS — I10 Essential (primary) hypertension: Secondary | ICD-10-CM | POA: Diagnosis not present

## 2015-09-08 DIAGNOSIS — L89322 Pressure ulcer of left buttock, stage 2: Secondary | ICD-10-CM | POA: Diagnosis not present

## 2015-09-09 ENCOUNTER — Ambulatory Visit: Payer: Medicare Other | Admitting: Podiatry

## 2015-09-10 DIAGNOSIS — Z48812 Encounter for surgical aftercare following surgery on the circulatory system: Secondary | ICD-10-CM | POA: Diagnosis not present

## 2015-09-10 DIAGNOSIS — L89322 Pressure ulcer of left buttock, stage 2: Secondary | ICD-10-CM | POA: Diagnosis not present

## 2015-09-10 DIAGNOSIS — F0390 Unspecified dementia without behavioral disturbance: Secondary | ICD-10-CM | POA: Diagnosis not present

## 2015-09-10 DIAGNOSIS — I5032 Chronic diastolic (congestive) heart failure: Secondary | ICD-10-CM | POA: Diagnosis not present

## 2015-09-10 DIAGNOSIS — E119 Type 2 diabetes mellitus without complications: Secondary | ICD-10-CM | POA: Diagnosis not present

## 2015-09-10 DIAGNOSIS — I10 Essential (primary) hypertension: Secondary | ICD-10-CM | POA: Diagnosis not present

## 2015-09-14 ENCOUNTER — Other Ambulatory Visit: Payer: Self-pay | Admitting: Adult Health

## 2015-09-14 DIAGNOSIS — I10 Essential (primary) hypertension: Secondary | ICD-10-CM | POA: Diagnosis not present

## 2015-09-14 DIAGNOSIS — F0390 Unspecified dementia without behavioral disturbance: Secondary | ICD-10-CM | POA: Diagnosis not present

## 2015-09-14 DIAGNOSIS — L89322 Pressure ulcer of left buttock, stage 2: Secondary | ICD-10-CM | POA: Diagnosis not present

## 2015-09-14 DIAGNOSIS — E119 Type 2 diabetes mellitus without complications: Secondary | ICD-10-CM | POA: Diagnosis not present

## 2015-09-14 DIAGNOSIS — I5032 Chronic diastolic (congestive) heart failure: Secondary | ICD-10-CM | POA: Diagnosis not present

## 2015-09-14 DIAGNOSIS — Z48812 Encounter for surgical aftercare following surgery on the circulatory system: Secondary | ICD-10-CM | POA: Diagnosis not present

## 2015-09-15 DIAGNOSIS — E119 Type 2 diabetes mellitus without complications: Secondary | ICD-10-CM | POA: Diagnosis not present

## 2015-09-15 DIAGNOSIS — I5032 Chronic diastolic (congestive) heart failure: Secondary | ICD-10-CM | POA: Diagnosis not present

## 2015-09-15 DIAGNOSIS — F0390 Unspecified dementia without behavioral disturbance: Secondary | ICD-10-CM | POA: Diagnosis not present

## 2015-09-15 DIAGNOSIS — Z48812 Encounter for surgical aftercare following surgery on the circulatory system: Secondary | ICD-10-CM | POA: Diagnosis not present

## 2015-09-15 DIAGNOSIS — L89322 Pressure ulcer of left buttock, stage 2: Secondary | ICD-10-CM | POA: Diagnosis not present

## 2015-09-15 DIAGNOSIS — I10 Essential (primary) hypertension: Secondary | ICD-10-CM | POA: Diagnosis not present

## 2015-09-16 DIAGNOSIS — E119 Type 2 diabetes mellitus without complications: Secondary | ICD-10-CM | POA: Diagnosis not present

## 2015-09-16 DIAGNOSIS — I10 Essential (primary) hypertension: Secondary | ICD-10-CM | POA: Diagnosis not present

## 2015-09-16 DIAGNOSIS — F0390 Unspecified dementia without behavioral disturbance: Secondary | ICD-10-CM | POA: Diagnosis not present

## 2015-09-16 DIAGNOSIS — L89322 Pressure ulcer of left buttock, stage 2: Secondary | ICD-10-CM | POA: Diagnosis not present

## 2015-09-16 DIAGNOSIS — I5032 Chronic diastolic (congestive) heart failure: Secondary | ICD-10-CM | POA: Diagnosis not present

## 2015-09-16 DIAGNOSIS — Z48812 Encounter for surgical aftercare following surgery on the circulatory system: Secondary | ICD-10-CM | POA: Diagnosis not present

## 2015-09-17 DIAGNOSIS — L89322 Pressure ulcer of left buttock, stage 2: Secondary | ICD-10-CM | POA: Diagnosis not present

## 2015-09-17 DIAGNOSIS — E119 Type 2 diabetes mellitus without complications: Secondary | ICD-10-CM | POA: Diagnosis not present

## 2015-09-17 DIAGNOSIS — I5032 Chronic diastolic (congestive) heart failure: Secondary | ICD-10-CM | POA: Diagnosis not present

## 2015-09-17 DIAGNOSIS — F0390 Unspecified dementia without behavioral disturbance: Secondary | ICD-10-CM | POA: Diagnosis not present

## 2015-09-17 DIAGNOSIS — Z48812 Encounter for surgical aftercare following surgery on the circulatory system: Secondary | ICD-10-CM | POA: Diagnosis not present

## 2015-09-17 DIAGNOSIS — I10 Essential (primary) hypertension: Secondary | ICD-10-CM | POA: Diagnosis not present

## 2015-09-21 DIAGNOSIS — L89322 Pressure ulcer of left buttock, stage 2: Secondary | ICD-10-CM | POA: Diagnosis not present

## 2015-09-21 DIAGNOSIS — E119 Type 2 diabetes mellitus without complications: Secondary | ICD-10-CM | POA: Diagnosis not present

## 2015-09-21 DIAGNOSIS — I5032 Chronic diastolic (congestive) heart failure: Secondary | ICD-10-CM | POA: Diagnosis not present

## 2015-09-21 DIAGNOSIS — F0390 Unspecified dementia without behavioral disturbance: Secondary | ICD-10-CM | POA: Diagnosis not present

## 2015-09-21 DIAGNOSIS — I10 Essential (primary) hypertension: Secondary | ICD-10-CM | POA: Diagnosis not present

## 2015-09-21 DIAGNOSIS — Z48812 Encounter for surgical aftercare following surgery on the circulatory system: Secondary | ICD-10-CM | POA: Diagnosis not present

## 2015-09-23 DIAGNOSIS — I10 Essential (primary) hypertension: Secondary | ICD-10-CM | POA: Diagnosis not present

## 2015-09-23 DIAGNOSIS — F0390 Unspecified dementia without behavioral disturbance: Secondary | ICD-10-CM | POA: Diagnosis not present

## 2015-09-23 DIAGNOSIS — I5032 Chronic diastolic (congestive) heart failure: Secondary | ICD-10-CM | POA: Diagnosis not present

## 2015-09-23 DIAGNOSIS — E119 Type 2 diabetes mellitus without complications: Secondary | ICD-10-CM | POA: Diagnosis not present

## 2015-09-23 DIAGNOSIS — Z48812 Encounter for surgical aftercare following surgery on the circulatory system: Secondary | ICD-10-CM | POA: Diagnosis not present

## 2015-09-23 DIAGNOSIS — L89322 Pressure ulcer of left buttock, stage 2: Secondary | ICD-10-CM | POA: Diagnosis not present

## 2015-10-01 DIAGNOSIS — I5032 Chronic diastolic (congestive) heart failure: Secondary | ICD-10-CM | POA: Diagnosis not present

## 2015-10-01 DIAGNOSIS — E119 Type 2 diabetes mellitus without complications: Secondary | ICD-10-CM | POA: Diagnosis not present

## 2015-10-01 DIAGNOSIS — I10 Essential (primary) hypertension: Secondary | ICD-10-CM | POA: Diagnosis not present

## 2015-10-01 DIAGNOSIS — F0390 Unspecified dementia without behavioral disturbance: Secondary | ICD-10-CM | POA: Diagnosis not present

## 2015-10-01 DIAGNOSIS — Z48812 Encounter for surgical aftercare following surgery on the circulatory system: Secondary | ICD-10-CM | POA: Diagnosis not present

## 2015-10-01 DIAGNOSIS — L89322 Pressure ulcer of left buttock, stage 2: Secondary | ICD-10-CM | POA: Diagnosis not present

## 2015-10-05 DIAGNOSIS — I5032 Chronic diastolic (congestive) heart failure: Secondary | ICD-10-CM | POA: Diagnosis not present

## 2015-10-05 DIAGNOSIS — E43 Unspecified severe protein-calorie malnutrition: Secondary | ICD-10-CM | POA: Diagnosis not present

## 2015-10-05 DIAGNOSIS — J209 Acute bronchitis, unspecified: Secondary | ICD-10-CM | POA: Diagnosis not present

## 2015-10-05 DIAGNOSIS — E119 Type 2 diabetes mellitus without complications: Secondary | ICD-10-CM | POA: Diagnosis not present

## 2015-10-05 DIAGNOSIS — F419 Anxiety disorder, unspecified: Secondary | ICD-10-CM | POA: Diagnosis not present

## 2015-10-05 DIAGNOSIS — F0390 Unspecified dementia without behavioral disturbance: Secondary | ICD-10-CM | POA: Diagnosis not present

## 2015-10-05 DIAGNOSIS — R32 Unspecified urinary incontinence: Secondary | ICD-10-CM | POA: Diagnosis not present

## 2015-10-05 DIAGNOSIS — Z794 Long term (current) use of insulin: Secondary | ICD-10-CM | POA: Diagnosis not present

## 2015-10-05 DIAGNOSIS — Z952 Presence of prosthetic heart valve: Secondary | ICD-10-CM | POA: Diagnosis not present

## 2015-10-05 DIAGNOSIS — I110 Hypertensive heart disease with heart failure: Secondary | ICD-10-CM | POA: Diagnosis not present

## 2015-10-05 DIAGNOSIS — L89622 Pressure ulcer of left heel, stage 2: Secondary | ICD-10-CM | POA: Diagnosis not present

## 2015-10-05 DIAGNOSIS — Z7902 Long term (current) use of antithrombotics/antiplatelets: Secondary | ICD-10-CM | POA: Diagnosis not present

## 2015-10-06 DIAGNOSIS — I110 Hypertensive heart disease with heart failure: Secondary | ICD-10-CM | POA: Diagnosis not present

## 2015-10-06 DIAGNOSIS — F419 Anxiety disorder, unspecified: Secondary | ICD-10-CM | POA: Diagnosis not present

## 2015-10-06 DIAGNOSIS — L89622 Pressure ulcer of left heel, stage 2: Secondary | ICD-10-CM | POA: Diagnosis not present

## 2015-10-06 DIAGNOSIS — E119 Type 2 diabetes mellitus without complications: Secondary | ICD-10-CM | POA: Diagnosis not present

## 2015-10-06 DIAGNOSIS — I5032 Chronic diastolic (congestive) heart failure: Secondary | ICD-10-CM | POA: Diagnosis not present

## 2015-10-06 DIAGNOSIS — F0390 Unspecified dementia without behavioral disturbance: Secondary | ICD-10-CM | POA: Diagnosis not present

## 2015-10-13 DIAGNOSIS — F0390 Unspecified dementia without behavioral disturbance: Secondary | ICD-10-CM | POA: Diagnosis not present

## 2015-10-13 DIAGNOSIS — E119 Type 2 diabetes mellitus without complications: Secondary | ICD-10-CM | POA: Diagnosis not present

## 2015-10-13 DIAGNOSIS — I110 Hypertensive heart disease with heart failure: Secondary | ICD-10-CM | POA: Diagnosis not present

## 2015-10-13 DIAGNOSIS — I5032 Chronic diastolic (congestive) heart failure: Secondary | ICD-10-CM | POA: Diagnosis not present

## 2015-10-13 DIAGNOSIS — F419 Anxiety disorder, unspecified: Secondary | ICD-10-CM | POA: Diagnosis not present

## 2015-10-13 DIAGNOSIS — L89622 Pressure ulcer of left heel, stage 2: Secondary | ICD-10-CM | POA: Diagnosis not present

## 2015-10-20 ENCOUNTER — Other Ambulatory Visit: Payer: Self-pay | Admitting: Adult Health

## 2015-10-21 DIAGNOSIS — F419 Anxiety disorder, unspecified: Secondary | ICD-10-CM | POA: Diagnosis not present

## 2015-10-21 DIAGNOSIS — I5032 Chronic diastolic (congestive) heart failure: Secondary | ICD-10-CM | POA: Diagnosis not present

## 2015-10-21 DIAGNOSIS — F0390 Unspecified dementia without behavioral disturbance: Secondary | ICD-10-CM | POA: Diagnosis not present

## 2015-10-21 DIAGNOSIS — L89622 Pressure ulcer of left heel, stage 2: Secondary | ICD-10-CM | POA: Diagnosis not present

## 2015-10-21 DIAGNOSIS — E119 Type 2 diabetes mellitus without complications: Secondary | ICD-10-CM | POA: Diagnosis not present

## 2015-10-21 DIAGNOSIS — I110 Hypertensive heart disease with heart failure: Secondary | ICD-10-CM | POA: Diagnosis not present

## 2015-10-26 DIAGNOSIS — I110 Hypertensive heart disease with heart failure: Secondary | ICD-10-CM | POA: Diagnosis not present

## 2015-10-26 DIAGNOSIS — F419 Anxiety disorder, unspecified: Secondary | ICD-10-CM | POA: Diagnosis not present

## 2015-10-26 DIAGNOSIS — E119 Type 2 diabetes mellitus without complications: Secondary | ICD-10-CM | POA: Diagnosis not present

## 2015-10-26 DIAGNOSIS — F0390 Unspecified dementia without behavioral disturbance: Secondary | ICD-10-CM | POA: Diagnosis not present

## 2015-10-26 DIAGNOSIS — I5032 Chronic diastolic (congestive) heart failure: Secondary | ICD-10-CM | POA: Diagnosis not present

## 2015-10-26 DIAGNOSIS — L89622 Pressure ulcer of left heel, stage 2: Secondary | ICD-10-CM | POA: Diagnosis not present

## 2015-11-02 ENCOUNTER — Other Ambulatory Visit: Payer: Self-pay | Admitting: Adult Health

## 2015-11-04 DIAGNOSIS — F419 Anxiety disorder, unspecified: Secondary | ICD-10-CM | POA: Diagnosis not present

## 2015-11-04 DIAGNOSIS — E119 Type 2 diabetes mellitus without complications: Secondary | ICD-10-CM | POA: Diagnosis not present

## 2015-11-04 DIAGNOSIS — L89622 Pressure ulcer of left heel, stage 2: Secondary | ICD-10-CM | POA: Diagnosis not present

## 2015-11-04 DIAGNOSIS — I5032 Chronic diastolic (congestive) heart failure: Secondary | ICD-10-CM | POA: Diagnosis not present

## 2015-11-04 DIAGNOSIS — F0390 Unspecified dementia without behavioral disturbance: Secondary | ICD-10-CM | POA: Diagnosis not present

## 2015-11-04 DIAGNOSIS — I110 Hypertensive heart disease with heart failure: Secondary | ICD-10-CM | POA: Diagnosis not present

## 2015-11-07 DIAGNOSIS — I10 Essential (primary) hypertension: Secondary | ICD-10-CM | POA: Diagnosis not present

## 2015-11-07 DIAGNOSIS — F0390 Unspecified dementia without behavioral disturbance: Secondary | ICD-10-CM | POA: Diagnosis not present

## 2015-11-07 DIAGNOSIS — I350 Nonrheumatic aortic (valve) stenosis: Secondary | ICD-10-CM | POA: Diagnosis not present

## 2015-11-10 DIAGNOSIS — F0390 Unspecified dementia without behavioral disturbance: Secondary | ICD-10-CM | POA: Diagnosis not present

## 2015-11-10 DIAGNOSIS — L89622 Pressure ulcer of left heel, stage 2: Secondary | ICD-10-CM | POA: Diagnosis not present

## 2015-11-10 DIAGNOSIS — F419 Anxiety disorder, unspecified: Secondary | ICD-10-CM | POA: Diagnosis not present

## 2015-11-10 DIAGNOSIS — I110 Hypertensive heart disease with heart failure: Secondary | ICD-10-CM | POA: Diagnosis not present

## 2015-11-10 DIAGNOSIS — I5032 Chronic diastolic (congestive) heart failure: Secondary | ICD-10-CM | POA: Diagnosis not present

## 2015-11-10 DIAGNOSIS — E119 Type 2 diabetes mellitus without complications: Secondary | ICD-10-CM | POA: Diagnosis not present

## 2015-11-11 ENCOUNTER — Ambulatory Visit: Payer: Medicare Other | Admitting: Podiatry

## 2015-11-19 DIAGNOSIS — E119 Type 2 diabetes mellitus without complications: Secondary | ICD-10-CM | POA: Diagnosis not present

## 2015-11-19 DIAGNOSIS — I5032 Chronic diastolic (congestive) heart failure: Secondary | ICD-10-CM | POA: Diagnosis not present

## 2015-11-19 DIAGNOSIS — I110 Hypertensive heart disease with heart failure: Secondary | ICD-10-CM | POA: Diagnosis not present

## 2015-11-19 DIAGNOSIS — F0390 Unspecified dementia without behavioral disturbance: Secondary | ICD-10-CM | POA: Diagnosis not present

## 2015-11-19 DIAGNOSIS — L89622 Pressure ulcer of left heel, stage 2: Secondary | ICD-10-CM | POA: Diagnosis not present

## 2015-11-19 DIAGNOSIS — F419 Anxiety disorder, unspecified: Secondary | ICD-10-CM | POA: Diagnosis not present

## 2015-11-23 DIAGNOSIS — F0390 Unspecified dementia without behavioral disturbance: Secondary | ICD-10-CM | POA: Diagnosis not present

## 2015-11-23 DIAGNOSIS — L89622 Pressure ulcer of left heel, stage 2: Secondary | ICD-10-CM | POA: Diagnosis not present

## 2015-11-23 DIAGNOSIS — I110 Hypertensive heart disease with heart failure: Secondary | ICD-10-CM | POA: Diagnosis not present

## 2015-11-23 DIAGNOSIS — F419 Anxiety disorder, unspecified: Secondary | ICD-10-CM | POA: Diagnosis not present

## 2015-11-23 DIAGNOSIS — E119 Type 2 diabetes mellitus without complications: Secondary | ICD-10-CM | POA: Diagnosis not present

## 2015-11-23 DIAGNOSIS — I5032 Chronic diastolic (congestive) heart failure: Secondary | ICD-10-CM | POA: Diagnosis not present

## 2015-11-30 DIAGNOSIS — F0390 Unspecified dementia without behavioral disturbance: Secondary | ICD-10-CM | POA: Diagnosis not present

## 2015-11-30 DIAGNOSIS — L89622 Pressure ulcer of left heel, stage 2: Secondary | ICD-10-CM | POA: Diagnosis not present

## 2015-11-30 DIAGNOSIS — F419 Anxiety disorder, unspecified: Secondary | ICD-10-CM | POA: Diagnosis not present

## 2015-11-30 DIAGNOSIS — I5032 Chronic diastolic (congestive) heart failure: Secondary | ICD-10-CM | POA: Diagnosis not present

## 2015-11-30 DIAGNOSIS — E119 Type 2 diabetes mellitus without complications: Secondary | ICD-10-CM | POA: Diagnosis not present

## 2015-11-30 DIAGNOSIS — I110 Hypertensive heart disease with heart failure: Secondary | ICD-10-CM | POA: Diagnosis not present

## 2015-12-04 DIAGNOSIS — R350 Frequency of micturition: Secondary | ICD-10-CM | POA: Diagnosis not present

## 2015-12-11 DIAGNOSIS — R05 Cough: Secondary | ICD-10-CM | POA: Diagnosis not present

## 2015-12-11 DIAGNOSIS — R0602 Shortness of breath: Secondary | ICD-10-CM | POA: Diagnosis not present

## 2015-12-16 ENCOUNTER — Emergency Department (HOSPITAL_COMMUNITY): Payer: Medicare Other

## 2015-12-16 ENCOUNTER — Encounter (HOSPITAL_COMMUNITY): Payer: Self-pay

## 2015-12-16 ENCOUNTER — Inpatient Hospital Stay (HOSPITAL_COMMUNITY)
Admission: EM | Admit: 2015-12-16 | Discharge: 2015-12-18 | DRG: 202 | Disposition: A | Discharge status: Home / Self Care | Payer: Medicare Other | Attending: Internal Medicine | Admitting: Internal Medicine

## 2015-12-16 DIAGNOSIS — J209 Acute bronchitis, unspecified: Secondary | ICD-10-CM

## 2015-12-16 DIAGNOSIS — Z794 Long term (current) use of insulin: Secondary | ICD-10-CM | POA: Diagnosis not present

## 2015-12-16 DIAGNOSIS — Z7984 Long term (current) use of oral hypoglycemic drugs: Secondary | ICD-10-CM

## 2015-12-16 DIAGNOSIS — I5032 Chronic diastolic (congestive) heart failure: Secondary | ICD-10-CM | POA: Diagnosis present

## 2015-12-16 DIAGNOSIS — F419 Anxiety disorder, unspecified: Secondary | ICD-10-CM | POA: Diagnosis present

## 2015-12-16 DIAGNOSIS — J9801 Acute bronchospasm: Secondary | ICD-10-CM | POA: Diagnosis present

## 2015-12-16 DIAGNOSIS — I2510 Atherosclerotic heart disease of native coronary artery without angina pectoris: Secondary | ICD-10-CM | POA: Diagnosis present

## 2015-12-16 DIAGNOSIS — Z953 Presence of xenogenic heart valve: Secondary | ICD-10-CM

## 2015-12-16 DIAGNOSIS — N179 Acute kidney failure, unspecified: Secondary | ICD-10-CM | POA: Diagnosis not present

## 2015-12-16 DIAGNOSIS — Z7902 Long term (current) use of antithrombotics/antiplatelets: Secondary | ICD-10-CM

## 2015-12-16 DIAGNOSIS — E119 Type 2 diabetes mellitus without complications: Secondary | ICD-10-CM

## 2015-12-16 DIAGNOSIS — R05 Cough: Secondary | ICD-10-CM | POA: Diagnosis not present

## 2015-12-16 DIAGNOSIS — R0602 Shortness of breath: Secondary | ICD-10-CM | POA: Diagnosis not present

## 2015-12-16 DIAGNOSIS — Z66 Do not resuscitate: Secondary | ICD-10-CM | POA: Diagnosis present

## 2015-12-16 DIAGNOSIS — M1990 Unspecified osteoarthritis, unspecified site: Secondary | ICD-10-CM | POA: Diagnosis present

## 2015-12-16 DIAGNOSIS — I110 Hypertensive heart disease with heart failure: Secondary | ICD-10-CM | POA: Diagnosis present

## 2015-12-16 DIAGNOSIS — K219 Gastro-esophageal reflux disease without esophagitis: Secondary | ICD-10-CM | POA: Diagnosis present

## 2015-12-16 DIAGNOSIS — Z954 Presence of other heart-valve replacement: Secondary | ICD-10-CM | POA: Diagnosis not present

## 2015-12-16 DIAGNOSIS — F0390 Unspecified dementia without behavioral disturbance: Secondary | ICD-10-CM | POA: Diagnosis present

## 2015-12-16 DIAGNOSIS — Z79899 Other long term (current) drug therapy: Secondary | ICD-10-CM

## 2015-12-16 DIAGNOSIS — E785 Hyperlipidemia, unspecified: Secondary | ICD-10-CM | POA: Diagnosis present

## 2015-12-16 DIAGNOSIS — Z951 Presence of aortocoronary bypass graft: Secondary | ICD-10-CM

## 2015-12-16 DIAGNOSIS — Z952 Presence of prosthetic heart valve: Secondary | ICD-10-CM

## 2015-12-16 HISTORY — DX: Acute bronchitis, unspecified: J20.9

## 2015-12-16 HISTORY — DX: Acute bronchospasm: J98.01

## 2015-12-16 HISTORY — DX: Unspecified dementia, unspecified severity, without behavioral disturbance, psychotic disturbance, mood disturbance, and anxiety: F03.90

## 2015-12-16 LAB — CBC WITH DIFFERENTIAL/PLATELET
BASOS ABS: 0 10*3/uL (ref 0.0–0.1)
Basophils Relative: 0 %
EOS ABS: 0.1 10*3/uL (ref 0.0–0.7)
EOS PCT: 1 %
HCT: 34.8 % — ABNORMAL LOW (ref 36.0–46.0)
Hemoglobin: 11.7 g/dL — ABNORMAL LOW (ref 12.0–15.0)
LYMPHS PCT: 30 %
Lymphs Abs: 2.6 10*3/uL (ref 0.7–4.0)
MCH: 27.1 pg (ref 26.0–34.0)
MCHC: 33.6 g/dL (ref 30.0–36.0)
MCV: 80.6 fL (ref 78.0–100.0)
Monocytes Absolute: 0.7 10*3/uL (ref 0.1–1.0)
Monocytes Relative: 8 %
NEUTROS PCT: 62 %
Neutro Abs: 5.5 10*3/uL (ref 1.7–7.7)
Platelets: 263 10*3/uL (ref 150–400)
RBC: 4.32 MIL/uL (ref 3.87–5.11)
RDW: 14.5 % (ref 11.5–15.5)
WBC: 8.9 10*3/uL (ref 4.0–10.5)

## 2015-12-16 LAB — BRAIN NATRIURETIC PEPTIDE: B Natriuretic Peptide: 51 pg/mL (ref 0.0–100.0)

## 2015-12-16 LAB — BASIC METABOLIC PANEL
Anion gap: 9 (ref 5–15)
BUN: 35 mg/dL — AB (ref 6–20)
CHLORIDE: 104 mmol/L (ref 101–111)
CO2: 30 mmol/L (ref 22–32)
CREATININE: 1.4 mg/dL — AB (ref 0.44–1.00)
Calcium: 10.3 mg/dL (ref 8.9–10.3)
GFR calc Af Amer: 39 mL/min — ABNORMAL LOW (ref >60)
GFR calc non Af Amer: 33 mL/min — ABNORMAL LOW (ref >60)
GLUCOSE: 107 mg/dL — AB (ref 65–99)
POTASSIUM: 4.7 mmol/L (ref 3.5–5.1)
SODIUM: 143 mmol/L (ref 135–145)

## 2015-12-16 LAB — GLUCOSE, CAPILLARY: GLUCOSE-CAPILLARY: 157 mg/dL — AB (ref 65–99)

## 2015-12-16 LAB — TROPONIN I: Troponin I: <0.03 ng/mL (ref <0.031)

## 2015-12-16 MED ORDER — ATORVASTATIN CALCIUM 40 MG PO TABS
80.0000 mg | ORAL_TABLET | Freq: Every day | ORAL | Status: DC
  Administered 2015-12-16 – 2015-12-17 (×2): 80 mg via ORAL
  Filled 2015-12-16 (×2): qty 2

## 2015-12-16 MED ORDER — ALPRAZOLAM 0.25 MG PO TABS
0.1250 mg | ORAL_TABLET | Freq: Two times a day (BID) | ORAL | Status: DC | PRN
  Administered 2015-12-18: 0.125 mg via ORAL
  Filled 2015-12-16: qty 1

## 2015-12-16 MED ORDER — ONDANSETRON HCL 4 MG PO TABS: 4.0000 mg | ORAL_TABLET | Freq: Four times a day (QID) | ORAL | Status: DC | PRN

## 2015-12-16 MED ORDER — ONDANSETRON HCL 4 MG/2ML IJ SOLN: 4.0000 mg | Freq: Four times a day (QID) | INTRAMUSCULAR | Status: DC | PRN

## 2015-12-16 MED ORDER — ENOXAPARIN SODIUM 40 MG/0.4ML ~~LOC~~ SOLN
40.0000 mg | SUBCUTANEOUS | Status: DC
  Administered 2015-12-16 – 2015-12-17 (×2): 40 mg via SUBCUTANEOUS
  Filled 2015-12-16 (×2): qty 0.4

## 2015-12-16 MED ORDER — ALBUTEROL (5 MG/ML) CONTINUOUS INHALATION SOLN
10.0000 mg/h | INHALATION_SOLUTION | Freq: Once | RESPIRATORY_TRACT | Status: AC
  Administered 2015-12-16: 10 mg/h via RESPIRATORY_TRACT
  Filled 2015-12-16: qty 20

## 2015-12-16 MED ORDER — ENSURE ENLIVE PO LIQD
237.0000 mL | Freq: Two times a day (BID) | ORAL | Status: DC
  Administered 2015-12-17 – 2015-12-18 (×2): 237 mL via ORAL

## 2015-12-16 MED ORDER — MEMANTINE HCL ER 28 MG PO CP24
28.0000 mg | ORAL_CAPSULE | Freq: Every day | ORAL | Status: DC
  Administered 2015-12-17 – 2015-12-18 (×2): 28 mg via ORAL
  Filled 2015-12-16 (×5): qty 1

## 2015-12-16 MED ORDER — IPRATROPIUM BROMIDE 0.02 % IN SOLN
1.0000 mg | Freq: Once | RESPIRATORY_TRACT | Status: AC
  Administered 2015-12-16: 1 mg via RESPIRATORY_TRACT
  Filled 2015-12-16: qty 5

## 2015-12-16 MED ORDER — INSULIN DETEMIR 100 UNIT/ML ~~LOC~~ SOLN
30.0000 [IU] | Freq: Two times a day (BID) | SUBCUTANEOUS | Status: DC
  Administered 2015-12-16 – 2015-12-18 (×4): 30 [IU] via SUBCUTANEOUS
  Filled 2015-12-16 (×10): qty 0.3

## 2015-12-16 MED ORDER — METHYLPREDNISOLONE SODIUM SUCC 125 MG IJ SOLR
125.0000 mg | Freq: Once | INTRAMUSCULAR | Status: AC
  Administered 2015-12-16: 125 mg via INTRAVENOUS
  Filled 2015-12-16: qty 2

## 2015-12-16 MED ORDER — GUAIFENESIN ER 600 MG PO TB12
1200.0000 mg | ORAL_TABLET | Freq: Two times a day (BID) | ORAL | Status: DC
  Administered 2015-12-16 – 2015-12-18 (×4): 1200 mg via ORAL
  Filled 2015-12-16 (×4): qty 2

## 2015-12-16 MED ORDER — IPRATROPIUM-ALBUTEROL 0.5-2.5 (3) MG/3ML IN SOLN: 3.0000 mL | Freq: Once | RESPIRATORY_TRACT | Status: DC

## 2015-12-16 MED ORDER — NEBIVOLOL HCL 10 MG PO TABS
10.0000 mg | ORAL_TABLET | Freq: Every day | ORAL | Status: DC
  Administered 2015-12-16 – 2015-12-18 (×3): 10 mg via ORAL
  Filled 2015-12-16 (×3): qty 1

## 2015-12-16 MED ORDER — FUROSEMIDE 10 MG/ML IJ SOLN
40.0000 mg | Freq: Once | INTRAMUSCULAR | Status: DC
  Filled 2015-12-16: qty 4

## 2015-12-16 MED ORDER — ALBUTEROL SULFATE (2.5 MG/3ML) 0.083% IN NEBU: 2.5000 mg | INHALATION_SOLUTION | RESPIRATORY_TRACT | Status: DC

## 2015-12-16 MED ORDER — INSULIN DETEMIR 100 UNIT/ML ~~LOC~~ SOLN
SUBCUTANEOUS | Status: AC
  Filled 2015-12-16: qty 1

## 2015-12-16 MED ORDER — ALBUTEROL SULFATE (2.5 MG/3ML) 0.083% IN NEBU: 2.5000 mg | INHALATION_SOLUTION | Freq: Once | RESPIRATORY_TRACT | Status: DC

## 2015-12-16 MED ORDER — METHYLPREDNISOLONE SODIUM SUCC 125 MG IJ SOLR
60.0000 mg | Freq: Four times a day (QID) | INTRAMUSCULAR | Status: DC
  Administered 2015-12-17 – 2015-12-18 (×6): 60 mg via INTRAVENOUS
  Filled 2015-12-16 (×6): qty 2

## 2015-12-16 MED ORDER — SENNOSIDES-DOCUSATE SODIUM 8.6-50 MG PO TABS
2.0000 | ORAL_TABLET | Freq: Every day | ORAL | Status: DC
  Administered 2015-12-16 – 2015-12-18 (×3): 2 via ORAL
  Filled 2015-12-16 (×3): qty 2

## 2015-12-16 MED ORDER — SODIUM CHLORIDE 0.9 % IV SOLN
INTRAVENOUS | Status: DC
  Administered 2015-12-16: 23:00:00 via INTRAVENOUS

## 2015-12-16 MED ORDER — HYDRALAZINE HCL 25 MG PO TABS
50.0000 mg | ORAL_TABLET | Freq: Three times a day (TID) | ORAL | Status: DC
Start: 2015-12-16 — End: 2015-12-18
  Administered 2015-12-16 – 2015-12-18 (×5): 50 mg via ORAL
  Filled 2015-12-16 (×5): qty 2

## 2015-12-16 MED ORDER — CLOPIDOGREL BISULFATE 75 MG PO TABS
75.0000 mg | ORAL_TABLET | Freq: Every day | ORAL | Status: DC
  Administered 2015-12-17 – 2015-12-18 (×2): 75 mg via ORAL
  Filled 2015-12-16 (×2): qty 1

## 2015-12-16 MED ORDER — IPRATROPIUM-ALBUTEROL 0.5-2.5 (3) MG/3ML IN SOLN
3.0000 mL | Freq: Four times a day (QID) | RESPIRATORY_TRACT | Status: DC
  Administered 2015-12-16 – 2015-12-17 (×3): 3 mL via RESPIRATORY_TRACT
  Filled 2015-12-16 (×3): qty 3

## 2015-12-16 MED ORDER — IPRATROPIUM BROMIDE 0.02 % IN SOLN: 0.5000 mg | Freq: Four times a day (QID) | RESPIRATORY_TRACT | Status: DC

## 2015-12-16 MED ORDER — PANTOPRAZOLE SODIUM 40 MG PO TBEC
40.0000 mg | DELAYED_RELEASE_TABLET | Freq: Every day | ORAL | Status: DC
  Administered 2015-12-16 – 2015-12-18 (×3): 40 mg via ORAL
  Filled 2015-12-16 (×3): qty 1

## 2015-12-16 MED ORDER — ALBUTEROL SULFATE (2.5 MG/3ML) 0.083% IN NEBU: 2.5000 mg | INHALATION_SOLUTION | Freq: Four times a day (QID) | RESPIRATORY_TRACT | Status: DC

## 2015-12-16 MED ORDER — ALBUTEROL SULFATE (2.5 MG/3ML) 0.083% IN NEBU: 2.5000 mg | INHALATION_SOLUTION | RESPIRATORY_TRACT | Status: DC | PRN

## 2015-12-16 NOTE — ED Provider Notes (Signed)
CSN: [id number]     Arrival date & time 12/16/15  1616 History   First MD Initiated Contact with Patient 12/16/15 1631     Chief Complaint  Patient presents with  . Shortness of Breath      Patient is a 82 y.o. female presenting with shortness of breath. The history is provided by the patient, a relative and a caregiver. The history is limited by the condition of the patient (Hx dementia).  Shortness of Breath  Pt was seen at 1635.  Per pt and her family, c/o gradual onset and worsening of persistent cough, wheezing and SOB for the past 2 weeks, worse over the past 2 days. Has been associated with generalized weakness/fatigue. Pt was evaluated by her PMD 5 days ago for these symptoms, given IM and PO steroids, MDI and nebulizer, as well as PO abx.  Has been using home MDI and nebs with transient relief until 2 days ago when her symptoms worsened.  Denies CP/palpitations, no back pain, no abd pain, no N/V/D, no fevers, no rash.    Past Medical History  Diagnosis Date  . Gastric mass   . Hernia of unspecified site of abdominal cavity without mention of obstruction or gangrene     hiatal  . Gastric ulcer   . Thyroid mass   . GERD (gastroesophageal reflux disease)   . Anxiety   . Coronary artery disease   . Osteoarthritis   . Anemia   . Hypertension   . Hyperlipidemia   . Diabetes mellitus, type 2 ([place])   . Aortic valvular stenosis     moderate to severe  EF on 2D Echo 10/22/2012 65-70%  valve area of 1.2cm, and peak and mean gradients of 45 and 82mmHg  . CHF (congestive heart failure) ([place])   . Dyspnea     chronic  . Easy fatigability   . Normal cardiac stress test 12/2013    normal stress nuclear study  . Heart murmur   . Chalazion of right upper eyelid early June 2016    treating with cream, Rx by Dr. Merlyn Albert    . Dementia    Past Surgical History  Procedure Laterality Date  . Coronary artery bypass graft      x4 in 2003  . Tubal ligation    . Knee surgery Right   .  Uterine polypectomy    . Internal hemorrhoidectomy    . Cataract extraction Bilateral   . Left and right heart catheterization with coronary/graft angiogram N/A 03/24/2015    Procedure: LEFT AND RIGHT HEART CATHETERIZATION WITH Beatrix Fetters;  Surgeon: Burnell Blanks, MD;  Location: [facility] CATH LAB;  Service: Cardiovascular;  Laterality: N/A;  . Transcatheter aortic valve replacement, transfemoral N/A 05/26/2015    Procedure: TRANSCATHETER AORTIC VALVE REPLACEMENT, TRANSFEMORAL;  Surgeon: Burnell Blanks, MD;  Location: [place];  Service: Open Heart Surgery;  Laterality: N/A;  . Tee without cardioversion N/A 05/26/2015    Procedure: TRANSESOPHAGEAL ECHOCARDIOGRAM (TEE);  Surgeon: Burnell Blanks, MD;  Location: [place];  Service: Open Heart Surgery;  Laterality: N/A;  . Aortic valve surgery     Family History  Problem Relation Age of Onset  . Colon cancer Neg Hx   . Diabetes Mother   . Heart disease Mother   . Stroke Father   . Stroke Mother   . Heart attack Brother   . Cerebral aneurysm Mother    Social History  Substance Use Topics  . Smoking status: Never  Smoker   . Smokeless tobacco: Never Used  . Alcohol Use: No    Review of Systems  Unable to perform ROS: Dementia  Respiratory: Positive for shortness of breath.      Allergies  Review of patient's allergies indicates no known allergies.  Home Medications   Prior to Admission medications   Medication Sig Start Date End Date Taking? Authorizing Provider  acetaminophen (TYLENOL) 500 MG tablet Take two tablets by mouth twice daily for pain. Do not exceed 4gm of Tylenol in 24 hours    Historical Provider, MD  ALPRAZolam (XANAX) 0.25 MG tablet Take 0.5 tablets (0.125 mg total) by mouth 2 (two) times daily as needed for anxiety or sleep. Patient taking differently: Take 1/2 tablet by mouth twice daily as needed for anxiety or sleep; Take 1/2 tablet by mouth once daily for anxiety 06/03/15   Evelene Croon  Barrett, PA-C  atorvastatin (LIPITOR) 80 MG tablet Take one tablet by mouth once daily for cholesterol    Historical Provider, MD  clopidogrel (PLAVIX) 75 MG tablet Take 1 tablet (75 mg total) by mouth daily with breakfast. 06/03/15   Evelene Croon Barrett, PA-C  fexofenadine (ALLEGRA) 180 MG tablet Take 180 mg by mouth daily.    Historical Provider, MD  furosemide (LASIX) 40 MG tablet Take 40 mg by mouth 2 (two) times daily.    Historical Provider, MD  guaiFENesin (MUCINEX) 600 MG 12 hr tablet Take 1 tablet (600 mg total) by mouth 2 (two) times daily. Patient taking differently: Take one tablet by mouth twice daily for acute pulmonary edema. Do not crush 06/03/15   Evelene Croon Barrett, PA-C  hydrALAZINE (APRESOLINE) 50 MG tablet Take 1 tablet (50 mg total) by mouth every 8 (eight) hours. Patient taking differently: Take one tablet by mouth every eight hours for blood pressure 06/03/15   Rhonda G Barrett, PA-C  insulin detemir (LEVEMIR) 100 UNIT/ML injection Inject 0.3 mLs (30 Units total) into the skin 2 (two) times daily. Patient taking differently: Inject 30 units subcutaneously twice daily to control blood sugar 06/03/15   Rhonda G Barrett, PA-C  Ipratropium-Albuterol (COMBIVENT RESPIMAT) 20-100 MCG/ACT AERS respimat Inhale 2 puffs into the lungs every 6 (six) hours as needed for wheezing or shortness of breath. 10/17/14   [name], MD  lactulose (CHRONULAC) 10 GM/15ML solution Take 45 mLs (30 g total) by mouth daily as needed for mild constipation or severe constipation. 06/03/15   Rhonda G Barrett, PA-C  Linaclotide (LINZESS) 145 MCG CAPS capsule Take 145 mcg by mouth daily as needed (for constipation).    Historical Provider, MD  lisinopril (PRINIVIL,ZESTRIL) 10 MG tablet Take 1 tablet (10 mg total) by mouth daily. Patient taking differently: Take one tablet by mouth once daily for blood pressure 06/03/15   Rhonda G Barrett, PA-C  loperamide (IMODIUM) 2 MG capsule Take 2 mg by mouth as needed for  diarrhea or loose stools.    Historical Provider, MD  Memantine HCl ER (NAMENDA XR) 28 MG CP24 Take one capsule by mouth every morning for dementia    Historical Provider, MD  metFORMIN (GLUCOPHAGE-XR) 500 MG 24 hr tablet Take one tablet by mouth once daily with breakfast to control blood sugar. Do not crush 03/16/15   Historical Provider, MD  nebivolol (BYSTOLIC) 10 MG tablet Take one tablet by mouth every morning for blood pressure    Historical Provider, MD  NEXIUM 20 MG capsule Take one capsule by mouth once daily for reflux 04/25/14  Historical Provider, MD  polyethylene glycol (MIRALAX / GLYCOLAX) packet Take 17 g by mouth daily. Patient taking differently: Mix 17gm in 4-8 oz of liquid and take daily to prevent constipation 06/03/15   Evelene Croon Barrett, PA-C  potassium chloride SA (K-DUR,KLOR-CON) 20 MEQ tablet Take 20 mEq by mouth daily.    Historical Provider, MD  Protein (PROCEL) POWD Administer two scoops by mouth twice daily for nutritional support    Historical Provider, MD  sennosides-docusate sodium (SENOKOT-S) 8.6-50 MG tablet Take two tablets by mouth twice daily    Historical Provider, MD  traMADol (ULTRAM) 50 MG tablet Take 1-2 tablets (50-100 mg total) by mouth every 4 (four) hours as needed for moderate pain. Patient taking differently: Take one tablet by mouth every 4 hours as needed for mild to moderate pain; Take two tablets by mouth every 4 hours as needed for severe pain. 06/03/15   Rhonda G Barrett, PA-C  VOLTAREN 1 % GEL Apply 2 g topically daily as needed (pain).  03/25/13   Historical Provider, MD   BP 131/46 mmHg  Pulse 79  Temp(Src) 98 F (36.7 C) (Oral)  Resp 24  SpO2 96% Physical Exam 1640: Physical examination:  Nursing notes reviewed; Vital signs and O2 SAT reviewed;  Constitutional: Well developed, Well nourished, Well hydrated, Uncomfortable appearing.; Head:  Normocephalic, atraumatic; Eyes: EOMI, PERRL, No scleral icterus; ENMT: Mouth and pharynx normal, Mucous  membranes moist; Neck: Supple, Full range of motion, No lymphadenopathy; Cardiovascular: Regular rate and rhythm, No gallop; Respiratory: Breath sounds diminished & equal bilaterally, insp/exp wheezes bilat. Occasional audible wheezing and moist cough. Speaking short sentences, sitting upright. Tachypneic.; Chest: Nontender, Movement normal; Abdomen: Soft, Nontender, Nondistended, Normal bowel sounds; Genitourinary: No CVA tenderness; Extremities: Pulses normal, No tenderness, +2 pedal edema bilat. No calf asymmetry.; Neuro: Awake, alert, vague historian per hx dementia. Major CN grossly intact.  Speech clear. No gross focal motor deficits in extremities.; Skin: Color normal, Warm, Dry.   ED Course  Procedures (including critical care time) Labs Review  Imaging Review  I have personally reviewed and evaluated these images and lab results as part of my medical decision-making.   EKG Interpretation   Date/Time:  Wednesday December 16 2015 16:51:58 EST Ventricular Rate:  76 PR Interval:  170 QRS Duration: 91 QT Interval:  361 QTC Calculation: 406 R Axis:   18 Text Interpretation:  Sinus rhythm Low voltage, precordial leads LVH by  voltage Baseline wander When compared with ECG of 05/27/2015 No significant  change was found Confirmed by [facility]  MD, Nunzio Cory [phone]) on 12/16/2015  5:05:08 PM      MDM  MDM Reviewed: previous chart, nursing note and vitals Reviewed previous: labs and ECG Interpretation: labs, ECG and x-ray Total time providing critical care: 30-74 minutes. This excludes time spent performing separately reportable procedures and services. Consults: admitting MD   CRITICAL CARE Performed by: Alfonzo Feller Total critical care time: 35 minutes Critical care time was exclusive of separately billable procedures and treating other patients. Critical care was necessary to treat or prevent imminent or life-threatening deterioration. Critical care was time spent  personally by me on the following activities: development of treatment plan with patient and/or surrogate as well as nursing, discussions with consultants, evaluation of patient's response to treatment, examination of patient, obtaining history from patient or surrogate, ordering and performing treatments and interventions, ordering and review of laboratory studies, ordering and review of radiographic studies, pulse oximetry and re-evaluation of patient's condition.   Results  for orders placed or performed during the hospital encounter of 12/16/15  CBC with Differential  Result Value Ref Range   WBC 8.9 4.0 - 10.5 K/uL   RBC 4.32 3.87 - 5.11 MIL/uL   Hemoglobin 11.7 (L) 12.0 - 15.0 g/dL   HCT 34.8 (L) 36.0 - 46.0 %   MCV 80.6 78.0 - 100.0 fL   MCH 27.1 26.0 - 34.0 pg   MCHC 33.6 30.0 - 36.0 g/dL   RDW 14.5 11.5 - 15.5 %   Platelets 263 150 - 400 K/uL   Neutrophils Relative % 62 %   Neutro Abs 5.5 1.7 - 7.7 K/uL   Lymphocytes Relative 30 %   Lymphs Abs 2.6 0.7 - 4.0 K/uL   Monocytes Relative 8 %   Monocytes Absolute 0.7 0.1 - 1.0 K/uL   Eosinophils Relative 1 %   Eosinophils Absolute 0.1 0.0 - 0.7 K/uL   Basophils Relative 0 %   Basophils Absolute 0.0 0.0 - 0.1 K/uL   WBC Morphology ATYPICAL LYMPHOCYTES   Basic metabolic panel  Result Value Ref Range   Sodium 143 135 - 145 mmol/L   Potassium 4.7 3.5 - 5.1 mmol/L   Chloride 104 101 - 111 mmol/L   CO2 30 22 - 32 mmol/L   Glucose, Bld 107 (H) 65 - 99 mg/dL   BUN 35 (H) 6 - 20 mg/dL   Creatinine, Ser 1.40 (H) 0.44 - 1.00 mg/dL   Calcium 10.3 8.9 - 10.3 mg/dL   GFR calc non Af Amer 33 (L) >60 mL/min   GFR calc Af Amer 39 (L) >60 mL/min   Anion gap 9 5 - 15  Troponin I  Result Value Ref Range   Troponin I <0.03 <0.031 ng/mL  Brain natriuretic peptide  Result Value Ref Range   B Natriuretic Peptide 51.0 0.0 - 100.0 pg/mL   Dg Chest Portable 1 View 12/16/2015  CLINICAL DATA:  Cough for 2 weeks.  Shortness of breath and wheezing  EXAM: PORTABLE CHEST 1 VIEW COMPARISON:  05/31/2015 FINDINGS: Previous median sternotomy and CABG procedure. Mild cardiac enlargement. Aortic atherosclerosis noted. No pleural effusion or edema. Asymmetric elevation of right hemidiaphragm noted. IMPRESSION: 1. Cardiac enlargement. 2. No heart failure. Electronically Signed   By: Kerby Moors M.D.   On: 12/16/2015 16:52   Results for Katherine, [name] (MRN [id number]) as of 12/16/2015 20:24  Ref. Range 06/01/2015 12:03 06/02/2015 06:12 06/03/2015 04:10 06/05/2015 00:00 12/16/2015 17:10  BUN Latest Ref Range: 6-20 mg/dL 30 (H) 31 (H) 30 (H) 32 (A) 35 (H)  Creatinine Latest Ref Range: 0.44-1.00 mg/dL 0.95 0.96 1.03 (H) 1.0 1.40 (H)    2020:  On arrival: pt sitting upright, wheezing, tachypneic; hour long neb started. IV solumedrol given. No clear CHF on CXR and BNP normal. After neb: pt's lungs coarse with intermittent wheezing, moist cough continues, Sats 96% R/A sitting on stretcher. Pt stood to ambulate with O2 Sats dropping to 90% R/A and pt c/o increasing SOB with increasing RR. Pt sat back on stretcher and rested with O2 Sats increasing again to 96% R/A. Dx and testing d/w pt and family.  Questions answered.  Verb understanding, agreeable to admit.  T/C to [facility] Dr. Darrick Meigs, case discussed, including:  HPI, pertinent PM/SHx, VS/PE, dx testing, ED course and treatment:  Agreeable to admit, requests to write temporary orders, obtain observation tele bed to team APAdmits.   Francine Graven, DO 12/19/15 1550

## 2015-12-16 NOTE — ED Notes (Signed)
Pt unable to ambulate but just a couple of steps with pulse oximetry dropping immediately from 99 to 90.  Pt unable to tolerate without increased sob

## 2015-12-16 NOTE — ED Notes (Signed)
Pt had nebulizer treatement last at 4pm today.

## 2015-12-16 NOTE — H&P (Signed)
PCP:   Wende Neighbors, MD   Chief Complaint:  Shortness of breath  HPI: 82 year old female who   has a past medical history of Gastric mass; Hernia of unspecified site of abdominal cavity without mention of obstruction or gangrene; Gastric ulcer; Thyroid mass; GERD (gastroesophageal reflux disease); Anxiety; Coronary artery disease; Osteoarthritis; Anemia; Hypertension; Hyperlipidemia; Diabetes mellitus, type 2 ([place]); Aortic valvular stenosis; CHF (congestive heart failure) ([place]); Dyspnea; Easy fatigability; Normal cardiac stress test (12/2013); Heart murmur; Chalazion of right upper eyelid (early June 2016); and Dementia. Today comes to the hospital with worsening shortness of breath and cough for past 2 weeks. Patient was seen by the primary care physician 5 days ago at that time she was prescribed Levaquin, given one dose of Solu-Medrol, nebulizer treatment. For past 2  days patient has been coughing more than usual. Also complains of shortness of breath on exertion. Denies chest pain, no nausea vomiting or diarrhea. No fever. Patient has history of diastolic heart failure, the dose of Lasix was increased to 40 mg by mouth twice daily by primary care physician 5 days ago.  In the ED patient given DuoNeb nebulizer, Solu-Medrol. Chest x-ray shows no infiltrate. BNP 51.0. Creatinine 1.40  Allergies:  No Known Allergies    Past Medical History  Diagnosis Date  . Gastric mass   . Hernia of unspecified site of abdominal cavity without mention of obstruction or gangrene     hiatal  . Gastric ulcer   . Thyroid mass   . GERD (gastroesophageal reflux disease)   . Anxiety   . Coronary artery disease   . Osteoarthritis   . Anemia   . Hypertension   . Hyperlipidemia   . Diabetes mellitus, type 2 ([place])   . Aortic valvular stenosis     moderate to severe  EF on 2D Echo 10/22/2012 65-70%  valve area of 1.2cm, and peak and mean gradients of 45 and 6mmHg  . CHF (congestive heart failure) ([place])    . Dyspnea     chronic  . Easy fatigability   . Normal cardiac stress test 12/2013    normal stress nuclear study  . Heart murmur   . Chalazion of right upper eyelid early June 2016    treating with cream, Rx by Dr. Merlyn Albert    . Dementia     Past Surgical History  Procedure Laterality Date  . Coronary artery bypass graft      x4 in 2003  . Tubal ligation    . Knee surgery Right   . Uterine polypectomy    . Internal hemorrhoidectomy    . Cataract extraction Bilateral   . Left and right heart catheterization with coronary/graft angiogram N/A 03/24/2015    Procedure: LEFT AND RIGHT HEART CATHETERIZATION WITH Beatrix Fetters;  Surgeon: Burnell Blanks, MD;  Location: [facility] CATH LAB;  Service: Cardiovascular;  Laterality: N/A;  . Transcatheter aortic valve replacement, transfemoral N/A 05/26/2015    Procedure: TRANSCATHETER AORTIC VALVE REPLACEMENT, TRANSFEMORAL;  Surgeon: Burnell Blanks, MD;  Location: [place];  Service: Open Heart Surgery;  Laterality: N/A;  . Tee without cardioversion N/A 05/26/2015    Procedure: TRANSESOPHAGEAL ECHOCARDIOGRAM (TEE);  Surgeon: Burnell Blanks, MD;  Location: [place];  Service: Open Heart Surgery;  Laterality: N/A;  . Aortic valve surgery      Prior to Admission medications   Medication Sig Start Date End Date Taking? Authorizing Provider  acetaminophen (TYLENOL) 500 MG tablet Take two tablets by mouth twice  daily for pain. Do not exceed 4gm of Tylenol in 24 hours    Historical Provider, MD  ALPRAZolam (XANAX) 0.25 MG tablet Take 0.5 tablets (0.125 mg total) by mouth 2 (two) times daily as needed for anxiety or sleep. Patient taking differently: Take 1/2 tablet by mouth twice daily as needed for anxiety or sleep; Take 1/2 tablet by mouth once daily for anxiety 06/03/15   Evelene Croon Barrett, PA-C  atorvastatin (LIPITOR) 80 MG tablet Take one tablet by mouth once daily for cholesterol    Historical Provider, MD  clopidogrel (PLAVIX) 75  MG tablet Take 1 tablet (75 mg total) by mouth daily with breakfast. 06/03/15   Evelene Croon Barrett, PA-C  fexofenadine (ALLEGRA) 180 MG tablet Take 180 mg by mouth daily.    Historical Provider, MD  furosemide (LASIX) 40 MG tablet Take 40 mg by mouth 2 (two) times daily.    Historical Provider, MD  guaiFENesin (MUCINEX) 600 MG 12 hr tablet Take 1 tablet (600 mg total) by mouth 2 (two) times daily. Patient taking differently: Take one tablet by mouth twice daily for acute pulmonary edema. Do not crush 06/03/15   Evelene Croon Barrett, PA-C  hydrALAZINE (APRESOLINE) 50 MG tablet Take 1 tablet (50 mg total) by mouth every 8 (eight) hours. Patient taking differently: Take one tablet by mouth every eight hours for blood pressure 06/03/15   Rhonda G Barrett, PA-C  insulin detemir (LEVEMIR) 100 UNIT/ML injection Inject 0.3 mLs (30 Units total) into the skin 2 (two) times daily. Patient taking differently: Inject 30 units subcutaneously twice daily to control blood sugar 06/03/15   Rhonda G Barrett, PA-C  Ipratropium-Albuterol (COMBIVENT RESPIMAT) 20-100 MCG/ACT AERS respimat Inhale 2 puffs into the lungs every 6 (six) hours as needed for wheezing or shortness of breath. 10/17/14   [name], MD  lactulose (CHRONULAC) 10 GM/15ML solution Take 45 mLs (30 g total) by mouth daily as needed for mild constipation or severe constipation. 06/03/15   Rhonda G Barrett, PA-C  Linaclotide (LINZESS) 145 MCG CAPS capsule Take 145 mcg by mouth daily as needed (for constipation).    Historical Provider, MD  lisinopril (PRINIVIL,ZESTRIL) 10 MG tablet Take 1 tablet (10 mg total) by mouth daily. Patient taking differently: Take one tablet by mouth once daily for blood pressure 06/03/15   Rhonda G Barrett, PA-C  loperamide (IMODIUM) 2 MG capsule Take 2 mg by mouth as needed for diarrhea or loose stools.    Historical Provider, MD  Memantine HCl ER (NAMENDA XR) 28 MG CP24 Take one capsule by mouth every morning for dementia     Historical Provider, MD  metFORMIN (GLUCOPHAGE-XR) 500 MG 24 hr tablet Take one tablet by mouth once daily with breakfast to control blood sugar. Do not crush 03/16/15   Historical Provider, MD  nebivolol (BYSTOLIC) 10 MG tablet Take one tablet by mouth every morning for blood pressure    Historical Provider, MD  NEXIUM 20 MG capsule Take one capsule by mouth once daily for reflux 04/25/14   Historical Provider, MD  polyethylene glycol (MIRALAX / GLYCOLAX) packet Take 17 g by mouth daily. Patient taking differently: Mix 17gm in 4-8 oz of liquid and take daily to prevent constipation 06/03/15   Evelene Croon Barrett, PA-C  potassium chloride SA (K-DUR,KLOR-CON) 20 MEQ tablet Take 20 mEq by mouth daily.    Historical Provider, MD  Protein (PROCEL) POWD Administer two scoops by mouth twice daily for nutritional support    Historical Provider, MD  sennosides-docusate sodium (SENOKOT-S) 8.6-50 MG tablet Take two tablets by mouth twice daily    Historical Provider, MD  traMADol (ULTRAM) 50 MG tablet Take 1-2 tablets (50-100 mg total) by mouth every 4 (four) hours as needed for moderate pain. Patient taking differently: Take one tablet by mouth every 4 hours as needed for mild to moderate pain; Take two tablets by mouth every 4 hours as needed for severe pain. 06/03/15   Rhonda G Barrett, PA-C  VOLTAREN 1 % GEL Apply 2 g topically daily as needed (pain).  03/25/13   Historical Provider, MD    Social History:  reports that she has never smoked. She has never used smokeless tobacco. She reports that she does not drink alcohol or use illicit drugs.  Family History  Problem Relation Age of Onset  . Colon cancer Neg Hx   . Diabetes Mother   . Heart disease Mother   . Stroke Father   . Stroke Mother   . Heart attack Brother   . Cerebral aneurysm Mother     There were no vitals filed for this visit.  All the positives are listed in BOLD  Review of Systems:  HEENT: Headache, blurred vision, runny nose, sore  throat Neck: Hypothyroidism, hyperthyroidism,,lymphadenopathy Chest : Shortness of breath, history of COPD, Asthma Heart : Chest pain, history of coronary arterey disease GI:  Nausea, vomiting, diarrhea, constipation, GERD GU: Dysuria, urgency, frequency of urination, hematuria Neuro: Stroke, seizures, syncope Psych: Depression, anxiety, hallucinations   Physical Exam: Blood pressure 140/66, pulse 106, temperature 98 F (36.7 C), temperature source Oral, resp. rate 19, SpO2 94 %. Constitutional:   Patient is a well-developed and well-nourished female in no acute distress and cooperative with exam. Head: Normocephalic and atraumatic Mouth: Mucus membranes moist Eyes: PERRL, EOMI, conjunctivae normal Neck: Supple, No Thyromegaly Cardiovascular: RRR, S1 normal, S2 normal Pulmonary/Chest: Bilateral rhonchi Abdominal: Soft. Non-tender, non-distended, bowel sounds are normal, no masses, organomegaly, or guarding present.  Neurological: A&O x3, Strength is normal and symmetric bilaterally, cranial nerve II-XII are grossly intact, no focal motor deficit, sensory intact to light touch bilaterally.  Extremities : No Cyanosis, Clubbing or Edema  Labs on Admission:  Basic Metabolic Panel:  Recent Labs Lab 12/16/15 1710  NA 143  K 4.7  CL 104  CO2 30  GLUCOSE 107*  BUN 35*  CREATININE 1.40*  CALCIUM 10.3   CBC:  Recent Labs Lab 12/16/15 1710  WBC 8.9  NEUTROABS 5.5  HGB 11.7*  HCT 34.8*  MCV 80.6  PLT 263   Cardiac Enzymes:  Recent Labs Lab 12/16/15 1710  TROPONINI <0.03    BNP (last 3 results)  Recent Labs  12/16/15 1710  BNP 51.0    Radiological Exams on Admission: Dg Chest Portable 1 View  12/16/2015  CLINICAL DATA:  Cough for 2 weeks.  Shortness of breath and wheezing EXAM: PORTABLE CHEST 1 VIEW COMPARISON:  05/31/2015 FINDINGS: Previous median sternotomy and CABG procedure. Mild cardiac enlargement. Aortic atherosclerosis noted. No pleural effusion or  edema. Asymmetric elevation of right hemidiaphragm noted. IMPRESSION: 1. Cardiac enlargement. 2. No heart failure. Electronically Signed   By: Kerby Moors M.D.   On: 12/16/2015 16:52    EKG: Independently reviewed. Normal sinus rhythm   Assessment/Plan Active Problems:   Diabetes mellitus, type 2 (HCC)   S/P TAVR (transcatheter aortic valve replacement)   Bronchospasm   Acute bronchitis  acute kidney injury  Acute bronchitis Likely viral, will start Solu-Medrol 60 mg IV every 6  hours, DuoNeb every 6 hours, Mucinex 1 tablet twice a day twice a day. Will not start Levaquin as patient has already got 5 days of Levaquin. Chest x-ray shows no pneumonia, W BC normal.  Acute kidney injury Patient's baseline creatinine 1.0, today creatinine 1.40 likely from increased dose of Lasix 5 days ago. Start gentle IV hydration with normal saline at 75 mL per hour period Follow BMP in a.m.  Diabetes mellitus Continue Levemir, start sliding scale insulin with NovoLog.  Chronic diastolic CHF Currently compensated, BNP 51.0 Patient on Lasix 40 mg twice a day at home, Can send home on 40 mg by mouth daily at the time of discharge  Status post transcatheter aortic valve replacement Stable  Hypertension Continue Bystolic, hold lisinopril due to renal insufficiency. Continue hydralazine 50 mg by mouth every 8 hours  DVT prophylaxis Lovenox   Code status: DO NOT RESUSCITATE  Family discussion: Admission, patients condition and plan of care including tests being ordered have been discussed with the patient and 2 daughters at bedside* who indicate understanding and agree with the plan and Code Status.   Time Spent on Admission: 60 minutes  [facility] Pager: [phone] 12/16/2015, 9:01 PM  If 7PM-7AM, please contact night-coverage  [email or web address]  Password [email or web address]

## 2015-12-16 NOTE — ED Notes (Signed)
Daughter reports pt saw pcp last week for cough, reports cough better but now sob and wheezing.

## 2015-12-17 DIAGNOSIS — I5032 Chronic diastolic (congestive) heart failure: Secondary | ICD-10-CM | POA: Diagnosis present

## 2015-12-17 DIAGNOSIS — Z952 Presence of prosthetic heart valve: Secondary | ICD-10-CM | POA: Diagnosis not present

## 2015-12-17 DIAGNOSIS — M1990 Unspecified osteoarthritis, unspecified site: Secondary | ICD-10-CM | POA: Diagnosis present

## 2015-12-17 DIAGNOSIS — Z951 Presence of aortocoronary bypass graft: Secondary | ICD-10-CM | POA: Diagnosis not present

## 2015-12-17 DIAGNOSIS — Z7984 Long term (current) use of oral hypoglycemic drugs: Secondary | ICD-10-CM | POA: Diagnosis not present

## 2015-12-17 DIAGNOSIS — Z7902 Long term (current) use of antithrombotics/antiplatelets: Secondary | ICD-10-CM | POA: Diagnosis not present

## 2015-12-17 DIAGNOSIS — Z66 Do not resuscitate: Secondary | ICD-10-CM | POA: Diagnosis present

## 2015-12-17 DIAGNOSIS — F0390 Unspecified dementia without behavioral disturbance: Secondary | ICD-10-CM | POA: Diagnosis present

## 2015-12-17 DIAGNOSIS — Z79899 Other long term (current) drug therapy: Secondary | ICD-10-CM | POA: Diagnosis not present

## 2015-12-17 DIAGNOSIS — Z794 Long term (current) use of insulin: Secondary | ICD-10-CM | POA: Diagnosis not present

## 2015-12-17 DIAGNOSIS — J209 Acute bronchitis, unspecified: Secondary | ICD-10-CM | POA: Diagnosis not present

## 2015-12-17 DIAGNOSIS — E119 Type 2 diabetes mellitus without complications: Secondary | ICD-10-CM | POA: Diagnosis present

## 2015-12-17 DIAGNOSIS — K219 Gastro-esophageal reflux disease without esophagitis: Secondary | ICD-10-CM | POA: Diagnosis present

## 2015-12-17 DIAGNOSIS — N179 Acute kidney failure, unspecified: Secondary | ICD-10-CM | POA: Diagnosis present

## 2015-12-17 DIAGNOSIS — R0602 Shortness of breath: Secondary | ICD-10-CM | POA: Diagnosis not present

## 2015-12-17 DIAGNOSIS — F419 Anxiety disorder, unspecified: Secondary | ICD-10-CM | POA: Diagnosis present

## 2015-12-17 DIAGNOSIS — I110 Hypertensive heart disease with heart failure: Secondary | ICD-10-CM | POA: Diagnosis present

## 2015-12-17 DIAGNOSIS — E785 Hyperlipidemia, unspecified: Secondary | ICD-10-CM | POA: Diagnosis present

## 2015-12-17 DIAGNOSIS — I2510 Atherosclerotic heart disease of native coronary artery without angina pectoris: Secondary | ICD-10-CM | POA: Diagnosis present

## 2015-12-17 LAB — CBC
HCT: 34.3 % — ABNORMAL LOW (ref 36.0–46.0)
HEMOGLOBIN: 11.6 g/dL — AB (ref 12.0–15.0)
MCH: 27.1 pg (ref 26.0–34.0)
MCHC: 33.8 g/dL (ref 30.0–36.0)
MCV: 80.1 fL (ref 78.0–100.0)
Platelets: 299 10*3/uL (ref 150–400)
RBC: 4.28 MIL/uL (ref 3.87–5.11)
RDW: 14.8 % (ref 11.5–15.5)
WBC: 7.9 10*3/uL (ref 4.0–10.5)

## 2015-12-17 LAB — COMPREHENSIVE METABOLIC PANEL
ALBUMIN: 3.6 g/dL (ref 3.5–5.0)
ALT: 14 U/L (ref 14–54)
ANION GAP: 10 (ref 5–15)
AST: 19 U/L (ref 15–41)
Alkaline Phosphatase: 83 U/L (ref 38–126)
BUN: 37 mg/dL — ABNORMAL HIGH (ref 6–20)
CO2: 27 mmol/L (ref 22–32)
Calcium: 10.2 mg/dL (ref 8.9–10.3)
Chloride: 106 mmol/L (ref 101–111)
Creatinine, Ser: 1.26 mg/dL — ABNORMAL HIGH (ref 0.44–1.00)
GFR calc Af Amer: 44 mL/min — ABNORMAL LOW (ref >60)
GFR calc non Af Amer: 38 mL/min — ABNORMAL LOW (ref >60)
GLUCOSE: 229 mg/dL — AB (ref 65–99)
POTASSIUM: 4.7 mmol/L (ref 3.5–5.1)
SODIUM: 143 mmol/L (ref 135–145)
Total Bilirubin: 0.5 mg/dL (ref 0.3–1.2)
Total Protein: 7.1 g/dL (ref 6.5–8.1)

## 2015-12-17 LAB — GLUCOSE, CAPILLARY
GLUCOSE-CAPILLARY: 256 mg/dL — AB (ref 65–99)
Glucose-Capillary: 198 mg/dL — ABNORMAL HIGH (ref 65–99)
Glucose-Capillary: 266 mg/dL — ABNORMAL HIGH (ref 65–99)
Glucose-Capillary: 172 mg/dL — ABNORMAL HIGH (ref 65–99)

## 2015-12-17 LAB — INFLUENZA PANEL BY PCR (TYPE A & B)
H1N1 flu by pcr: NOT DETECTED
Influenza A By PCR: NEGATIVE
Influenza B By PCR: NEGATIVE

## 2015-12-17 MED ORDER — INSULIN ASPART 100 UNIT/ML ~~LOC~~ SOLN
0.0000 [IU] | Freq: Three times a day (TID) | SUBCUTANEOUS | Status: DC
  Administered 2015-12-17: 12:00:00 via SUBCUTANEOUS
  Administered 2015-12-17: 5 [IU] via SUBCUTANEOUS
  Administered 2015-12-17: 2 [IU] via SUBCUTANEOUS
  Administered 2015-12-18: 7 [IU] via SUBCUTANEOUS
  Administered 2015-12-18: 2 [IU] via SUBCUTANEOUS

## 2015-12-17 MED ORDER — IPRATROPIUM-ALBUTEROL 0.5-2.5 (3) MG/3ML IN SOLN
3.0000 mL | Freq: Two times a day (BID) | RESPIRATORY_TRACT | Status: DC
  Administered 2015-12-17 – 2015-12-18 (×2): 3 mL via RESPIRATORY_TRACT
  Filled 2015-12-17 (×2): qty 3

## 2015-12-17 NOTE — Plan of Care (Signed)
Problem: Pain Managment: Goal: General experience of comfort will improve Outcome: Progressing Pt denies pain.  Problem: Physical Regulation: Goal: Ability to maintain clinical measurements within normal limits will improve Outcome: Progressing See flowsheet. Pt placed on 2L due to O2 stats declining. Pt was running sinus tach on monitor.   Problem: Skin Integrity: Goal: Risk for impaired skin integrity will decrease Outcome: Progressing Pt states she has no skin issues.  Problem: Tissue Perfusion: Goal: Risk factors for ineffective tissue perfusion will decrease Outcome: Progressing Lovenox is ordered for VTE.  Problem: Activity: Goal: Risk for activity intolerance will decrease Outcome: Progressing Pt requires frequent rest breaks.   Problem: Nutrition: Goal: Adequate nutrition will be maintained Outcome: Progressing Pt on Heart Healthy diet. Ate only 50% of meal.

## 2015-12-17 NOTE — Progress Notes (Addendum)
Nutrition Brief Note  Patient identified on the Malnutrition Screening Tool (MST) Report. She presents with complaint of shortness of breath. Hx of CHF. Her appetite is good noted below and weight hx shows usual body weight ranging between 210-220# the past 5 months.   Body mass index is 39.42 kg/(m^2). Patient meets criteria for obesity class II based on current BMI.   Current diet order is Heart healthy, patient is consuming approximately 75-100% of meals at this time. Labs and medications reviewed.   No nutrition interventions warranted at this time. If nutrition issues arise, please consult RD.   Colman Cater MS,RD,CSG,LDN Office: [phone] Pager: [phone]

## 2015-12-17 NOTE — Care Management Note (Signed)
Case Management Note  Patient Details  Name: UCHECHI SPLAIN MRN: [id number] Date of Birth: 1928-11-23  Subjective/Objective:                  Pt is from home, lives with her daughter. Pt has [place] working and PD aids that stay with her during the day and her daughter is home at night. Pt has no HH nursing services prior to admission. Pt has neb machine. Pt uses walker and has wheelchair if needed. Pt and daughter plan on pt to return home with previous arrangements at DC.  Action/Plan: No CM needs anticipated, will cont to follow.   Expected Discharge Date:      12/18/2015            Expected Discharge Plan:  Home/Self Care  In-House Referral:  NA  Discharge planning Services  CM Consult  Post Acute Care Choice:  NA Choice offered to:  NA  DME Arranged:    DME Agency:     HH Arranged:    HH Agency:     Status of Service:  In process, will continue to follow  Medicare Important Message Given:    Date Medicare IM Given:    Medicare IM give by:    Date Additional Medicare IM Given:    Additional Medicare Important Message give by:     If discussed at [place] of Stay Meetings, dates discussed:    Additional Comments:  Sherald Barge, RN 12/17/2015, 3:23 PM

## 2015-12-17 NOTE — Progress Notes (Signed)
[facility] PROGRESS NOTE  [name] [id number] DOB: 1928/05/02 DOA: 12/16/2015 PCP: Wende Neighbors, MD  Assessment/Plan: Acute bronchitis -Likely viral in origin, will check influenza PCR. -Still has scant wheezes on exam and coarse breath sounds. -I agree with continued steroids, nebs, Mucinex. No antibiotics as he just completed a 5 day course of Levaquin. -Chest x-ray does not show evidence of pneumonia.  Acute renal failure -Baseline creatinine is around 1, -Creatinine is slowly decreasing towards baseline with IV fluids.  Chronic diastolic CHF -Appears compensated at present.  Diabetes mellitus -Fair control. Monitor and adjust regimen as needed.  Hypertension -ACE inhibitor on hold due to renal insufficiency. -Fair control.  Code Status: DO NOT RESUSCITATE Family Communication: Discussed with daughter Minette Brine at bedside  Disposition Plan: Home when ready, anticipate 24-48 hours   Consultants:  None   Antibiotics:  None   Subjective: Still feels short of breath  Objective: Filed Vitals:   12/17/15 0236 12/17/15 0532 12/17/15 0808 12/17/15 1315  BP:  145/68  180/69  Pulse:  93  108  Temp:  98.7 F (37.1 C)  98.7 F (37.1 C)  TempSrc:  Oral  Oral  Resp:  16  18  Height:      Weight:      SpO2: 97% 96% 93% 95%    Intake/Output Summary (Last 24 hours) at 12/17/15 1546 Last data filed at 12/17/15 1318  Gross per 24 hour  Intake 933.75 ml  Output    300 ml  Net 633.75 ml   Filed Weights   12/16/15 2102  Weight: 97.796 kg (215 lb 9.6 oz)    Exam:   General:  Alert, awake, oriented 3, morbidly obese  Cardiovascular: Regular rate and rhythm  Respiratory: Clear to auscultation bilaterally  Abdomen: Soft, nontender, nondistended, obese  Extremities: 1+ pitting edema bilaterally   Neurologic:  Grossly intact and nonfocal although generally weak  Data Reviewed: Basic Metabolic Panel:  Recent Labs Lab  12/16/15 1710 12/17/15 0710  NA 143 143  K 4.7 4.7  CL 104 106  CO2 30 27  GLUCOSE 107* 229*  BUN 35* 37*  CREATININE 1.40* 1.26*  CALCIUM 10.3 10.2   Liver Function Tests:  Recent Labs Lab 12/17/15 0710  AST 19  ALT 14  ALKPHOS 83  BILITOT 0.5  PROT 7.1  ALBUMIN 3.6   No results for input(s): LIPASE, AMYLASE in the last 168 hours. No results for input(s): AMMONIA in the last 168 hours. CBC:  Recent Labs Lab 12/16/15 1710 12/17/15 0710  WBC 8.9 7.9  NEUTROABS 5.5  --   HGB 11.7* 11.6*  HCT 34.8* 34.3*  MCV 80.6 80.1  PLT 263 299   Cardiac Enzymes:  Recent Labs Lab 12/16/15 1710  TROPONINI <0.03   BNP (last 3 results)  Recent Labs  12/16/15 1710  BNP 51.0    ProBNP (last 3 results) No results for input(s): PROBNP in the last 8760 hours.  CBG:  Recent Labs Lab 12/16/15 2106 12/17/15 0743 12/17/15 1117  GLUCAP 157* 198* 266*    No results found for this or any previous visit (from the past 240 hour(s)).   Studies: Dg Chest Portable 1 View  12/16/2015  CLINICAL DATA:  Cough for 2 weeks.  Shortness of breath and wheezing EXAM: PORTABLE CHEST 1 VIEW COMPARISON:  05/31/2015 FINDINGS: Previous median sternotomy and CABG procedure. Mild cardiac enlargement. Aortic atherosclerosis noted. No pleural effusion or edema. Asymmetric elevation of right hemidiaphragm noted. IMPRESSION: 1. Cardiac  enlargement. 2. No heart failure. Electronically Signed   By: Kerby Moors M.D.   On: 12/16/2015 16:52    Scheduled Meds: . atorvastatin  80 mg Oral q1800  . clopidogrel  75 mg Oral Q breakfast  . enoxaparin (LOVENOX) injection  40 mg Subcutaneous Q24H  . feeding supplement (ENSURE ENLIVE)  237 mL Oral BID BM  . guaiFENesin  1,200 mg Oral BID  . hydrALAZINE  50 mg Oral 3 times per day  . insulin aspart  0-9 Units Subcutaneous TID WC  . insulin detemir  30 Units Subcutaneous BID  . ipratropium-albuterol  3 mL Nebulization BID  . memantine  28 mg Oral Daily   . methylPREDNISolone (SOLU-MEDROL) injection  60 mg Intravenous Q6H  . nebivolol  10 mg Oral Daily  . pantoprazole  40 mg Oral Daily  . senna-docusate  2 tablet Oral Daily   Continuous Infusions: . sodium chloride 75 mL/hr at 12/16/15 2321    Active Problems:   Diabetes mellitus, type 2 ([place])   S/P TAVR (transcatheter aortic valve replacement)   Bronchospasm   Acute bronchitis    Time spent: 25 minutes. Greater than 50% of this time was spent in direct contact with the patient coordinating care.    Katherine [name]  [facility] Pager [phone]  If 7PM-7AM, please contact night-coverage at [email or web address], password [facility] 12/17/2015, 3:46 PM  LOS: 0 days

## 2015-12-17 NOTE — Progress Notes (Signed)
**Note De-Identified  Obfuscation** Per RT protocol Duoneb treatment schedule has been changed to BID.  Patient is on RA SAT 94%, BBS clr/dim.  RRT to continue to monitor.

## 2015-12-17 NOTE — Care Management Obs Status (Signed)
[place] NOTIFICATION   Patient Details  Name: Katherine [name] MRN: [id number] Date of Birth: 28-Feb-1928   Medicare Observation Status Notification Given:  Yes    Sherald Barge, RN 12/17/2015, 3:21 PM

## 2015-12-18 LAB — HEMOGLOBIN A1C
Hgb A1c MFr Bld: 6.7 % — ABNORMAL HIGH (ref 4.8–5.6)
MEAN PLASMA GLUCOSE: 146 mg/dL

## 2015-12-18 LAB — BASIC METABOLIC PANEL
Anion gap: 6 (ref 5–15)
BUN: 32 mg/dL — AB (ref 6–20)
CALCIUM: 10.3 mg/dL (ref 8.9–10.3)
CHLORIDE: 109 mmol/L (ref 101–111)
CO2: 28 mmol/L (ref 22–32)
CREATININE: 0.9 mg/dL (ref 0.44–1.00)
GFR, EST NON AFRICAN AMERICAN: 57 mL/min — AB (ref >60)
Glucose, Bld: 224 mg/dL — ABNORMAL HIGH (ref 65–99)
Potassium: 4 mmol/L (ref 3.5–5.1)
SODIUM: 143 mmol/L (ref 135–145)

## 2015-12-18 LAB — CBC
HCT: 33.7 % — ABNORMAL LOW (ref 36.0–46.0)
Hemoglobin: 11.5 g/dL — ABNORMAL LOW (ref 12.0–15.0)
MCH: 27.1 pg (ref 26.0–34.0)
MCHC: 34.1 g/dL (ref 30.0–36.0)
MCV: 79.3 fL (ref 78.0–100.0)
PLATELETS: 299 10*3/uL (ref 150–400)
RBC: 4.25 MIL/uL (ref 3.87–5.11)
RDW: 14.5 % (ref 11.5–15.5)
WBC: 13.3 10*3/uL — AB (ref 4.0–10.5)

## 2015-12-18 LAB — GLUCOSE, CAPILLARY
GLUCOSE-CAPILLARY: 194 mg/dL — AB (ref 65–99)
GLUCOSE-CAPILLARY: 307 mg/dL — AB (ref 65–99)

## 2015-12-18 MED ORDER — PREDNISONE 10 MG PO TABS: 10.0000 mg | ORAL_TABLET | Freq: Every day | ORAL | Status: DC

## 2015-12-18 NOTE — Progress Notes (Addendum)
Nutrition Brief Note-follow-up    Wt Readings from Last 15 Encounters:  12/16/15 215 lb 9.6 oz (97.796 kg)  09/02/15 211 lb (95.709 kg)  07/24/15 215 lb 12.8 oz (97.886 kg)  07/10/15 221 lb (100.245 kg)  06/04/15 234 lb (106.142 kg)  06/03/15 222 lb 3.6 oz (100.8 kg)  05/22/15 234 lb (106.142 kg)  05/20/15 234 lb (106.142 kg)  04/15/15 234 lb (106.142 kg)  03/31/15 234 lb (106.142 kg)  03/24/15 234 lb (106.142 kg)  03/17/15 234 lb (106.142 kg)  02/18/15 234 lb 14.4 oz (106.55 kg)  11/21/14 230 lb (104.327 kg)  11/19/14 229 lb 8 oz (104.101 kg)    Body mass index is 39.42 kg/(m^2). Patient meets criteria for obesity class II based on current BMI. No recent weight changes. Nutrition focused exam findings: pt is well-nourished.  Katherine [name] is able to feed herself and her home diet is no added sugar or salt. Current diet order is heart healthy and she is consuming approximately 50-100% of meals at this time. Labs and medications reviewed. BUN 32, Glucose 224.  Recommend adding CHO Modified to Heart Healthy diet per prescription.   Colman Cater Katherine,RD,CSG,LDN Office: [phone] Pager: [phone]

## 2015-12-18 NOTE — Progress Notes (Signed)
Patient states understanding of discharge instructions.prescription given

## 2015-12-18 NOTE — Discharge Summary (Signed)
Physician Discharge Summary  Katherine [name] [id number] DOB: 1928/11/22 DOA: 12/16/2015  PCP: Wende Neighbors, MD  Admit date: 12/16/2015 Discharge date: 12/18/2015  Time spent: 45 minutes  Recommendations for Outpatient Follow-up:  -Will be discharged home today. -Advised to f/u with PCP in 2 weeks.   Discharge Diagnoses:  Active Problems:   Diabetes mellitus, type 2 (HCC)   S/P TAVR (transcatheter aortic valve replacement)   Bronchospasm   Acute bronchitis   Discharge Condition: Stable and improved  Filed Weights   12/16/15 2102  Weight: 97.796 kg (215 lb 9.6 oz)    History of present illness:  As per Dr. Darrick Meigs on 1/11: Today comes to the hospital with worsening shortness of breath and cough for past 2 weeks. Patient was seen by the primary care physician 5 days ago at that time she was prescribed Levaquin, given one dose of Solu-Medrol, nebulizer treatment. For past 2 days patient has been coughing more than usual. Also complains of shortness of breath on exertion. Denies chest pain, no nausea vomiting or diarrhea. No fever. Patient has history of diastolic heart failure, the dose of Lasix was increased to 40 mg by mouth twice daily by primary care physician 5 days ago.  In the ED patient given DuoNeb nebulizer, Solu-Medrol. Chest x-ray shows no infiltrate. BNP 51.0. Creatinine 1.40   Hospital Course:   Acute bronchitis -Likely viral in origin,  influenza PCR negative. -Wheezing has resolved. - No antibiotics as he just completed a 5 day course of Levaquin. -Chest x-ray does not show evidence of pneumonia. -Will DC on a prednisone course, mucinex.  Acute renal failure -Baseline creatinine is around 1, -Cr is back to baseline at 0.96 on DC. -ACE-I has been resumed.  Chronic diastolic CHF -Appears compensated at present.  Diabetes mellitus -Fair control. Monitor and adjust regimen as needed.  Hypertension -Fair control.   Procedures:  None    Consultations:  None  Discharge Instructions  Discharge Instructions    Diet - low sodium heart healthy    Complete by:  As directed      Increase activity slowly    Complete by:  As directed             Medication List    TAKE these medications        acetaminophen 500 MG tablet  Commonly known as:  TYLENOL  Take two tablets by mouth twice daily for pain. Do not exceed 4gm of Tylenol in 24 hours     ALPRAZolam 0.25 MG tablet  Commonly known as:  XANAX  Take 0.5 tablets (0.125 mg total) by mouth 2 (two) times daily as needed for anxiety or sleep.     atorvastatin 80 MG tablet  Commonly known as:  LIPITOR  Take one tablet by mouth once daily for cholesterol     clopidogrel 75 MG tablet  Commonly known as:  PLAVIX  Take 1 tablet (75 mg total) by mouth daily with breakfast.     fexofenadine 180 MG tablet  Commonly known as:  ALLEGRA  Take 180 mg by mouth daily.     furosemide 40 MG tablet  Commonly known as:  LASIX  Take 40 mg by mouth 2 (two) times daily.     guaiFENesin 600 MG 12 hr tablet  Commonly known as:  MUCINEX  Take 1 tablet (600 mg total) by mouth 2 (two) times daily.     hydrALAZINE 50 MG tablet  Commonly known as:  APRESOLINE  Take 1 tablet (50 mg total) by mouth every 8 (eight) hours.     insulin detemir 100 UNIT/ML injection  Commonly known as:  LEVEMIR  Inject 0.3 mLs (30 Units total) into the skin 2 (two) times daily.     Ipratropium-Albuterol 20-100 MCG/ACT Aers respimat  Commonly known as:  COMBIVENT RESPIMAT  Inhale 2 puffs into the lungs every 6 (six) hours as needed for wheezing or shortness of breath.     lactulose 10 GM/15ML solution  Commonly known as:  CHRONULAC  Take 45 mLs (30 g total) by mouth daily as needed for mild constipation or severe constipation.     LINZESS 145 MCG Caps capsule  Generic drug:  Linaclotide  Take 145 mcg by mouth daily as needed (for constipation).     lisinopril 10 MG tablet  Commonly known as:   PRINIVIL,ZESTRIL  Take 1 tablet (10 mg total) by mouth daily.     loperamide 2 MG capsule  Commonly known as:  IMODIUM  Take 2 mg by mouth as needed for diarrhea or loose stools.     metFORMIN 500 MG 24 hr tablet  Commonly known as:  GLUCOPHAGE-XR  Take one tablet by mouth once daily with breakfast to control blood sugar. Do not crush     NAMENDA XR 28 MG Cp24 24 hr capsule  Generic drug:  memantine  Take one capsule by mouth every morning for dementia     nebivolol 10 MG tablet  Commonly known as:  BYSTOLIC  Take one tablet by mouth every morning for blood pressure     NEXIUM 20 MG capsule  Generic drug:  esomeprazole  Take one capsule by mouth once daily for reflux     polyethylene glycol packet  Commonly known as:  MIRALAX / GLYCOLAX  Take 17 g by mouth daily.     potassium chloride SA 20 MEQ tablet  Commonly known as:  K-DUR,KLOR-CON  Take 20 mEq by mouth daily.     predniSONE 10 MG tablet  Commonly known as:  DELTASONE  Take 1 tablet (10 mg total) by mouth daily with breakfast. Take 6 tablets today and then decrease by 1 tablet daily until none are left.     PROCEL Powd  Administer two scoops by mouth twice daily for nutritional support     sennosides-docusate sodium 8.6-50 MG tablet  Commonly known as:  SENOKOT-S  Take two tablets by mouth twice daily     traMADol 50 MG tablet  Commonly known as:  ULTRAM  Take 1-2 tablets (50-100 mg total) by mouth every 4 (four) hours as needed for moderate pain.     VOLTAREN 1 % Gel  Generic drug:  diclofenac sodium  Apply 2 g topically daily as needed (pain).       No Known Allergies     Follow-up Information    Follow up with Wende Neighbors, MD. Schedule an appointment as soon as possible for a visit in 2 weeks.   Specialty:  Internal Medicine   Contact information:   [place] [id number] [phone]        The results of significant diagnostics from this hospitalization (including imaging,  microbiology, ancillary and laboratory) are listed below for reference.    Significant Diagnostic Studies: Dg Chest Portable 1 View  12/16/2015  CLINICAL DATA:  Cough for 2 weeks.  Shortness of breath and wheezing EXAM: PORTABLE CHEST 1 VIEW COMPARISON:  05/31/2015 FINDINGS: Previous median sternotomy and CABG procedure.  Mild cardiac enlargement. Aortic atherosclerosis noted. No pleural effusion or edema. Asymmetric elevation of right hemidiaphragm noted. IMPRESSION: 1. Cardiac enlargement. 2. No heart failure. Electronically Signed   By: Kerby Moors M.D.   On: 12/16/2015 16:52    Microbiology: No results found for this or any previous visit (from the past 240 hour(s)).   Labs: Basic Metabolic Panel:  Recent Labs Lab 12/16/15 1710 12/17/15 0710 12/18/15 0609  NA 143 143 143  K 4.7 4.7 4.0  CL 104 106 109  CO2 30 27 28   GLUCOSE 107* 229* 224*  BUN 35* 37* 32*  CREATININE 1.40* 1.26* 0.90  CALCIUM 10.3 10.2 10.3   Liver Function Tests:  Recent Labs Lab 12/17/15 0710  AST 19  ALT 14  ALKPHOS 83  BILITOT 0.5  PROT 7.1  ALBUMIN 3.6   No results for input(s): LIPASE, AMYLASE in the last 168 hours. No results for input(s): AMMONIA in the last 168 hours. CBC:  Recent Labs Lab 12/16/15 1710 12/17/15 0710 12/18/15 0609  WBC 8.9 7.9 13.3*  NEUTROABS 5.5  --   --   HGB 11.7* 11.6* 11.5*  HCT 34.8* 34.3* 33.7*  MCV 80.6 80.1 79.3  PLT 263 299 299   Cardiac Enzymes:  Recent Labs Lab 12/16/15 1710  TROPONINI <0.03   BNP: BNP (last 3 results)  Recent Labs  12/16/15 1710  BNP 51.0    ProBNP (last 3 results) No results for input(s): PROBNP in the last 8760 hours.  CBG:  Recent Labs Lab 12/17/15 1117 12/17/15 1619 12/17/15 2015 12/18/15 0747 12/18/15 1134  GLUCAP 266* 256* 172* 194* 307*       Signed:  [facility] Pager: [phone] 12/18/2015, 11:51 AM

## 2015-12-18 NOTE — Progress Notes (Signed)
Inpatient Diabetes Program Recommendations  AACE/ADA: New Consensus Statement on Inpatient Glycemic Control (2015)  Target Ranges:  Prepandial:   less than 140 mg/dL      Peak postprandial:   less than 180 mg/dL (1-2 hours)      Critically ill patients:  140 - 180 mg/dL   Review of Glycemic Control:  Results for DURU, WILTGEN (MRN [id number]) as of 12/18/2015 10:21  Ref. Range 12/17/2015 07:43 12/17/2015 11:17 12/17/2015 16:19 12/17/2015 20:15 12/18/2015 07:47  Glucose-Capillary Latest Ref Range: 65-99 mg/dL 198 (H) 266 (H) 256 (H) 172 (H) 194 (H)    Diabetes history: Type 2 diabetes Outpatient Diabetes medications: Levemir 30 units bid, Metformin XR 500 mg Current orders for Inpatient glycemic control:  Novolog sensitive tid with meals, Levemir 30 units bid Inpatient Diabetes Program Recommendations:   Consider adding Novolog 4 units tid with meals-Hold if patient eats less than 50%.  Thanks, Adah Perl, RN, BC-ADM Inpatient Diabetes Coordinator Pager [phone] (8a-5p)

## 2015-12-18 NOTE — Care Management Note (Signed)
Case Management Note  Patient Details  Name: ISOLA KELTZ MRN: [id number] Date of Birth: 27-Feb-1928  Expected Discharge Date:    12/19/2015              Expected Discharge Plan:  Home/Self Care  In-House Referral:  NA  Discharge planning Services  CM Consult  Post Acute Care Choice:  NA Choice offered to:  NA  DME Arranged:    DME Agency:     HH Arranged:    [place] Agency:     Status of Service:  Completed, signed off  Medicare Important Message Given:  Yes Date Medicare IM Given:    Medicare IM give by:    Date Additional Medicare IM Given:    Additional Medicare Important Message give by:     If discussed at [place] of Stay Meetings, dates discussed:    Additional Comments: Pt plans to return home with resumption of previous arrangements. No further CM needs.   Sherald Barge, RN 12/18/2015, 9:35 AM

## 2015-12-18 NOTE — Care Management Important Message (Signed)
Important Message  Patient Details  Name: Katherine [name] MRN: [id number] Date of Birth: 10-05-1928   Medicare Important Message Given:  Yes    Sherald Barge, RN 12/18/2015, 9:35 AM

## 2015-12-26 DIAGNOSIS — R0981 Nasal congestion: Secondary | ICD-10-CM | POA: Diagnosis not present

## 2016-01-01 ENCOUNTER — Other Ambulatory Visit: Payer: Self-pay | Admitting: *Deleted

## 2016-01-01 DIAGNOSIS — I350 Nonrheumatic aortic (valve) stenosis: Secondary | ICD-10-CM

## 2016-02-03 ENCOUNTER — Ambulatory Visit (INDEPENDENT_AMBULATORY_CARE_PROVIDER_SITE_OTHER): Payer: Medicare Other | Admitting: Podiatry

## 2016-02-03 ENCOUNTER — Encounter: Payer: Self-pay | Admitting: Podiatry

## 2016-02-03 DIAGNOSIS — E1140 Type 2 diabetes mellitus with diabetic neuropathy, unspecified: Secondary | ICD-10-CM | POA: Diagnosis not present

## 2016-02-03 DIAGNOSIS — M79676 Pain in unspecified toe(s): Secondary | ICD-10-CM

## 2016-02-03 DIAGNOSIS — B351 Tinea unguium: Secondary | ICD-10-CM | POA: Diagnosis not present

## 2016-02-03 NOTE — Progress Notes (Signed)
Patient ID: Katherine [name], female   DOB: June 12, 1928, 82 y.o.   MRN: [id number] Complaint:  Visit Type: Patient returns to my office for continued preventative foot care services. Complaint: Patient states" my nails have grown long and thick and become painful to walk and wear shoes" Patient has been diagnosed with DM with no foot complications. The patient presents for preventative foot care services. No changes to ROS  Podiatric Exam: Vascular: dorsalis pedis and posterior tibial pulses are palpable bilateral. Capillary return is immediate. Temperature gradient is WNL. Skin turgor WNL  Sensorium: Diminished Semmes Weinstein monofilament test. Normal tactile sensation bilaterally. Nail Exam: Pt has thick disfigured discolored nails with subungual debris noted bilateral entire nail hallux through fifth toenails Ulcer Exam: There is no evidence of ulcer or pre-ulcerative changes or infection. Orthopedic Exam: Muscle tone and strength are WNL. No limitations in general ROM. No crepitus or effusions noted. Foot type and digits show no abnormalities. Bony prominences are unremarkable. HAV deformity B/LSkin: No Porokeratosis. No infection or ulcers  Diagnosis:  Onychomycosis, , Pain in right toe, pain in left toes  Treatment & Plan Procedures and Treatment: Consent by patient was obtained for treatment procedures. The patient understood the discussion of treatment and procedures well. All questions were answered thoroughly reviewed. Debridement of mycotic and hypertrophic toenails, 1 through 5 bilateral and clearing of subungual debris. No ulceration, no infection noted.  Return Visit-Office Procedure: Patient instructed to return to the office for a follow up visit 3 months for continued evaluation and treatment.

## 2016-03-26 DIAGNOSIS — D509 Iron deficiency anemia, unspecified: Secondary | ICD-10-CM | POA: Diagnosis not present

## 2016-03-26 DIAGNOSIS — E782 Mixed hyperlipidemia: Secondary | ICD-10-CM | POA: Diagnosis not present

## 2016-03-26 DIAGNOSIS — E119 Type 2 diabetes mellitus without complications: Secondary | ICD-10-CM | POA: Diagnosis not present

## 2016-04-04 DIAGNOSIS — I10 Essential (primary) hypertension: Secondary | ICD-10-CM | POA: Diagnosis not present

## 2016-04-04 DIAGNOSIS — E782 Mixed hyperlipidemia: Secondary | ICD-10-CM | POA: Diagnosis not present

## 2016-04-04 DIAGNOSIS — E119 Type 2 diabetes mellitus without complications: Secondary | ICD-10-CM | POA: Diagnosis not present

## 2016-04-04 DIAGNOSIS — I5042 Chronic combined systolic (congestive) and diastolic (congestive) heart failure: Secondary | ICD-10-CM | POA: Diagnosis not present

## 2016-04-18 DIAGNOSIS — D49519 Neoplasm of unspecified behavior of unspecified kidney: Secondary | ICD-10-CM | POA: Diagnosis not present

## 2016-04-18 DIAGNOSIS — D49511 Neoplasm of unspecified behavior of right kidney: Secondary | ICD-10-CM | POA: Diagnosis not present

## 2016-04-18 DIAGNOSIS — Z0000 Encounter for general adult medical examination without abnormal findings: Secondary | ICD-10-CM | POA: Diagnosis not present

## 2016-04-18 DIAGNOSIS — C641 Malignant neoplasm of right kidney, except renal pelvis: Secondary | ICD-10-CM | POA: Diagnosis not present

## 2016-05-05 ENCOUNTER — Ambulatory Visit (INDEPENDENT_AMBULATORY_CARE_PROVIDER_SITE_OTHER): Payer: Medicare Other | Admitting: Podiatry

## 2016-05-05 ENCOUNTER — Encounter: Payer: Self-pay | Admitting: Podiatry

## 2016-05-05 DIAGNOSIS — E1140 Type 2 diabetes mellitus with diabetic neuropathy, unspecified: Secondary | ICD-10-CM

## 2016-05-05 DIAGNOSIS — B351 Tinea unguium: Secondary | ICD-10-CM | POA: Diagnosis not present

## 2016-05-05 DIAGNOSIS — M79676 Pain in unspecified toe(s): Secondary | ICD-10-CM

## 2016-05-05 NOTE — Progress Notes (Signed)
Patient ID: Katherine [name], female   DOB: March 29, 1928, 82 y.o.   MRN: [id number] Complaint:  Visit Type: Patient returns to my office for continued preventative foot care services. Complaint: Patient states" my nails have grown long and thick and become painful to walk and wear shoes" Patient has been diagnosed with DM with no foot complications. The patient presents for preventative foot care services. No changes to ROS  Podiatric Exam: Vascular: dorsalis pedis and posterior tibial pulses are not  palpable bilateral due to foot swelling.. Capillary return is immediate. Temperature gradient is WNL. Skin turgor WNL  Sensorium: Diminished Semmes Weinstein monofilament test. Normal tactile sensation bilaterally. Nail Exam: Pt has thick disfigured discolored nails with subungual debris noted bilateral entire nail hallux through fifth toenails Ulcer Exam: There is no evidence of ulcer or pre-ulcerative changes or infection. Orthopedic Exam: Muscle tone and strength are WNL. No limitations in general ROM. No crepitus or effusions noted. Foot type and digits show no abnormalities. Bony prominences are unremarkable. HAV deformity B/LSkin: No Porokeratosis. No infection or ulcers  Diagnosis:  Onychomycosis, , Pain in right toe, pain in left toes Diabetes with neuropathy. HAV  B/L,  Angiopathy  Treatment & Plan Procedures and Treatment: Consent by patient was obtained for treatment procedures. The patient understood the discussion of treatment and procedures well. All questions were answered thoroughly reviewed. Debridement of mycotic and hypertrophic toenails, 1 through 5 bilateral and clearing of subungual debris. No ulceration, no infection noted. Initiate diabetic paperwork for DPN and HAV deformity and absent pulses.  Return Visit-Office Procedure: Patient instructed to return to the office for a follow up visit 3 months for continued evaluation and treatment.   Gardiner Barefoot DPM

## 2016-05-27 ENCOUNTER — Ambulatory Visit (HOSPITAL_COMMUNITY): Payer: Medicare Other | Attending: Cardiovascular Disease

## 2016-05-27 ENCOUNTER — Encounter: Payer: Self-pay | Admitting: Cardiovascular Disease

## 2016-05-27 ENCOUNTER — Other Ambulatory Visit: Payer: Self-pay

## 2016-05-27 ENCOUNTER — Ambulatory Visit (INDEPENDENT_AMBULATORY_CARE_PROVIDER_SITE_OTHER): Payer: Medicare Other | Admitting: Cardiovascular Disease

## 2016-05-27 VITALS — BP 148/64 | HR 64 | Ht 62.0 in | Wt 234.0 lb

## 2016-05-27 DIAGNOSIS — E785 Hyperlipidemia, unspecified: Secondary | ICD-10-CM | POA: Diagnosis not present

## 2016-05-27 DIAGNOSIS — E119 Type 2 diabetes mellitus without complications: Secondary | ICD-10-CM | POA: Insufficient documentation

## 2016-05-27 DIAGNOSIS — I340 Nonrheumatic mitral (valve) insufficiency: Secondary | ICD-10-CM | POA: Insufficient documentation

## 2016-05-27 DIAGNOSIS — I509 Heart failure, unspecified: Secondary | ICD-10-CM | POA: Insufficient documentation

## 2016-05-27 DIAGNOSIS — Z953 Presence of xenogenic heart valve: Secondary | ICD-10-CM | POA: Insufficient documentation

## 2016-05-27 DIAGNOSIS — I110 Hypertensive heart disease with heart failure: Secondary | ICD-10-CM | POA: Diagnosis not present

## 2016-05-27 DIAGNOSIS — Z954 Presence of other heart-valve replacement: Secondary | ICD-10-CM | POA: Diagnosis not present

## 2016-05-27 DIAGNOSIS — I350 Nonrheumatic aortic (valve) stenosis: Secondary | ICD-10-CM

## 2016-05-27 DIAGNOSIS — Z6839 Body mass index (BMI) 39.0-39.9, adult: Secondary | ICD-10-CM | POA: Insufficient documentation

## 2016-05-27 DIAGNOSIS — I351 Nonrheumatic aortic (valve) insufficiency: Secondary | ICD-10-CM | POA: Insufficient documentation

## 2016-05-27 DIAGNOSIS — Z952 Presence of prosthetic heart valve: Secondary | ICD-10-CM

## 2016-05-27 LAB — ECHOCARDIOGRAM COMPLETE
AOVTI: 61.6 cm
AV pk vel: 245 cm/s
AVCELMEANRAT: 0.59
AVG: 13 mmHg
AVPG: 24 mmHg
AVPHT: 583 ms
Ao pk vel: 0.56 m/s
DOP CAL AO MEAN VELOCITY: 170 cm/s
E decel time: 183 msec
EERAT: 18.31
FS: 33 % (ref 28–44)
IVS/LV PW RATIO, ED: 1
LA ID, A-P, ES: 48 mm
LA vol A4C: 52.5 ml
LA vol index: 32 mL/m2
LA vol: 67.8 mL
LADIAMINDEX: 2.26 cm/m2
LEFT ATRIUM END SYS DIAM: 48 mm
LV E/e' medial: 18.31
LV E/e'average: 18.31
LV TDI E'MEDIAL: 5.63
LV e' LATERAL: 6.39 cm/s
LVOT VTI: 32.1 cm
LVOT peak VTI: 0.52 cm
LVOT peak grad rest: 7 mmHg
LVOT peak vel: 136 cm/s
MV Dec: 183
MV Peak grad: 5 mmHg
MV pk A vel: 107 m/s
MV pk E vel: 117 m/s
PW: 11.8 mm — AB (ref 0.6–1.1)
RV LATERAL S' VELOCITY: 8.79 cm/s
RV TAPSE: 19.3 mm
RV sys press: 49 mmHg
Reg peak vel: 322 cm/s
TDI e' lateral: 6.39
TR max vel: 322 cm/s

## 2016-05-27 NOTE — Progress Notes (Signed)
Multi-Disciplinary Valve Clinic Note  Chief Complaint  Patient presents with  . Diarrhea    History of Present Illness: 82 yo female with history of severe aortic valve stenosis now s/p TAVR on 05/26/15, CAD s/p 4V CABG in 2003, DM, HTN, hyperlipidemia, chronic diastolic CHF, obesity who is here today for her one year follow up after [name] procedure. She had been followed for aortic valve stenosis by Dr. Debara [name]. [name] procedure was performed on 05/26/15. She did well following the procedure with slow recovery due to her immobility.  She is here today for follow up. She is doing well. No chest pain or dyspnea. No LE edema. She is still mostly wheelchair bound due to leg weakness and knee pain. Echo today with normally functioning bioprosthetic valve with unchanged post-op gradient across the bioprosthetic valve. Trivial AI.   Primary Care Physician: Katherine [name] Primary Cardiologist: Dr. Debara [name]   Past Medical History  Diagnosis Date  . Gastric mass   . Hernia of unspecified site of abdominal cavity without mention of obstruction or gangrene     hiatal  . Gastric ulcer   . Thyroid mass   . GERD (gastroesophageal reflux disease)   . Anxiety   . Coronary artery disease   . Osteoarthritis   . Anemia   . Hypertension   . Hyperlipidemia   . Diabetes mellitus, type 2 ([place])   . Aortic valvular stenosis     moderate to severe  EF on 2D Echo 10/22/2012 65-70%  valve area of 1.2cm, and peak and mean gradients of 45 and 54mmHg  . CHF (congestive heart failure) ([place])   . Dyspnea     chronic  . Easy fatigability   . Normal cardiac stress test 12/2013    normal stress nuclear study  . Heart murmur   . Chalazion of right upper eyelid early June 2016    treating with cream, Rx by Dr. Merlyn [name]    . Dementia     Past Surgical History  Procedure Laterality Date  . Coronary artery bypass graft      x4 in 2003  . Tubal ligation    . Knee surgery Right   . Uterine polypectomy    . Internal  hemorrhoidectomy    . Cataract extraction Bilateral   . Left and right heart catheterization with coronary/graft angiogram N/A 03/24/2015    Procedure: LEFT AND RIGHT HEART CATHETERIZATION WITH Katherine [name];  Surgeon: Katherine Blanks, MD;  Location: [facility] C[facility] LAB;  Service: Cardiovascular;  Laterality: N/A;  . Transcatheter aortic valve replacement, transfemoral N/A 05/26/2015    Procedure: TRANSCATHETER AORTIC VALVE REPLACEMENT, TRANSFEMORAL;  Surgeon: Katherine Blanks, MD;  Location: [facility];  Service: Open Heart Surgery;  Laterality: N/A;  . Tee without cardioversion N/A 05/26/2015    Procedure: TRANSESOPHAGEAL ECHOCARDIOGRAM (TEE);  Surgeon: Katherine Blanks, MD;  Location: [facility];  Service: Open Heart Surgery;  Laterality: N/A;  . Aortic valve surgery      Current outpatient prescriptions:  .  acetaminophen (TYLENOL) 500 MG tablet, Take two tablets by mouth twice daily for pain. Do not exceed 4gm of Tylenol in 24 hours, Disp: , Rfl:  .  ALPRAZolam (XANAX) 0.25 MG tablet, Take 0.5 tablets (0.125 mg total) by mouth 2 (two) times daily as needed for anxiety or sleep. (Patient taking differently: Take 1/2 tablet by mouth twice daily as needed for anxiety or sleep; Take 1/2 tablet by mouth once daily for anxiety), Disp: 10 tablet, Rfl:  0 .  atorvastatin (LIPITOR) 80 MG tablet, Take one tablet by mouth once daily for cholesterol, Disp: , Rfl:  .  clopidogrel (PLAVIX) 75 MG tablet, Take 1 tablet (75 mg total) by mouth daily with breakfast., Disp: 30 tablet, Rfl: 11 .  fexofenadine (ALLEGRA) 180 MG tablet, Take 180 mg by mouth daily., Disp: , Rfl:  .  furosemide (LASIX) 40 MG tablet, Take 40 mg by mouth 2 (two) times daily., Disp: , Rfl:  .  guaiFENesin (MUCINEX) 600 MG 12 hr tablet, Take 1 tablet (600 mg total) by mouth 2 (two) times daily. (Patient taking differently: Take one tablet by mouth twice daily for acute pulmonary edema. Do not crush), Disp: , Rfl:  .  hydrALAZINE  (APRESOLINE) 50 MG tablet, Take 1 tablet (50 mg total) by mouth every 8 (eight) hours. (Patient taking differently: Take one tablet by mouth every eight hours for blood pressure), Disp: 90 tablet, Rfl: 11 .  insulin detemir (LEVEMIR) 100 UNIT/ML injection, Inject 0.3 mLs (30 Units total) into the skin 2 (two) times daily. (Patient taking differently: Inject 30 units subcutaneously twice daily to control blood sugar), Disp: 10 mL, Rfl: 11 .  Ipratropium-Albuterol (COMBIVENT RESPIMAT) 20-100 MCG/ACT AERS respimat, Inhale 2 puffs into the lungs every 6 (six) hours as needed for wheezing or shortness of breath., Disp: 1 Inhaler, Rfl: 2 .  lactulose (CHRONULAC) 10 GM/15ML solution, Take 45 mLs (30 g total) by mouth daily as needed for mild constipation or severe constipation., Disp: 240 mL, Rfl: 0 .  Linaclotide (LINZESS) 145 MCG CAPS capsule, Take 145 mcg by mouth daily as needed (for constipation)., Disp: , Rfl:  .  lisinopril (PRINIVIL,ZESTRIL) 10 MG tablet, Take 1 tablet (10 mg total) by mouth daily. (Patient taking differently: Take one tablet by mouth once daily for blood pressure), Disp: 30 tablet, Rfl: 11 .  loperamide (IMODIUM) 2 MG capsule, Take 2 mg by mouth as needed for diarrhea or loose stools., Disp: , Rfl:  .  Memantine HCl ER (NAMENDA XR) 28 MG CP24, Take one capsule by mouth every morning for dementia, Disp: , Rfl:  .  metFORMIN (GLUCOPHAGE-XR) 500 MG 24 hr tablet, Take one tablet by mouth once daily with breakfast to control blood sugar. Do not crush, Disp: , Rfl:  .  nebivolol (BYSTOLIC) 10 MG tablet, Take one tablet by mouth every morning for blood pressure, Disp: , Rfl:  .  NEXIUM 20 MG capsule, Take one capsule by mouth once daily for reflux, Disp: , Rfl:  .  polyethylene glycol (MIRALAX / GLYCOLAX) packet, Take 17 g by mouth daily. (Patient taking differently: Mix 17gm in 4-8 oz of liquid and take daily to prevent constipation), Disp: 14 each, Rfl: 0 .  potassium chloride SA  (K-DUR,KLOR-CON) 20 MEQ tablet, Take 20 mEq by mouth daily., Disp: , Rfl:  .  sennosides-docusate sodium (SENOKOT-S) 8.6-50 MG tablet, Take two tablets by mouth twice daily, Disp: , Rfl:  .  traMADol (ULTRAM) 50 MG tablet, Take 1-2 tablets (50-100 mg total) by mouth every 4 (four) hours as needed for moderate pain. (Patient taking differently: Take one tablet by mouth every 4 hours as needed for mild to moderate pain; Take two tablets by mouth every 4 hours as needed for severe pain.), Disp: 20 tablet, Rfl: 0 .  VOLTAREN 1 % GEL, Apply 2 g topically daily as needed (pain). , Disp: , Rfl:   No Known Allergies  Social History   Social History  . Marital  Status: Widowed    Spouse Name: N/A  . Number of Children: 5  . Years of Education: N/A   Occupational History  . Health and safety inspector and CNA    Social History Main Topics  . Smoking status: Never Smoker   . Smokeless tobacco: Never Used  . Alcohol Use: No  . Drug Use: No  . Sexual Activity: Not on file   Other Topics Concern  . Not on file   Social History Narrative    Family History  Problem Relation Age of Onset  . Colon cancer Neg Hx   . Diabetes Mother   . Heart disease Mother   . Stroke Father   . Stroke Mother   . Heart attack Brother   . Cerebral aneurysm Mother     Review of Systems:  As stated in the HPI and otherwise negative.   BP 148/64 mmHg  Pulse 64  Ht 5\' 2"  (1.575 m)  Wt 234 lb (106.142 kg)  BMI 42.79 kg/m2  Physical Examination: General: Well developed, well nourished, NAD HEENT: OP clear, mucus membranes moist SKIN: warm, dry. No rashes. Neuro: No focal deficits Musculoskeletal: Muscle strength 5/5 all ext Psychiatric: Mood and affect normal Neck: No JVD, no carotid bruits, no thyromegaly, no lymphadenopathy. Lungs:Clear bilaterally, no wheezes, rhonci, crackles Cardiovascular: Regular rate and rhythm. Systolic murmur noted. No gallops or rubs. Abdomen:Soft. Bowel sounds present. Non-tender.   Extremities: No lower extremity edema. Pulses are 2 + in the bilateral DP/PT.  Echo 05/27/16:  Left ventricle: The cavity size was normal. Wall thickness was  increased in a pattern of mild LVH. Systolic function was normal.  The estimated ejection fraction was in the range of 60% to 65%.  Doppler parameters are consistent with both elevated ventricular  end-diastolic filling pressure and elevated left atrial filling  pressure. - Aortic valve: Tissue bioprosthetic valve with stable systolic  gradients and trivial AR. There was trivial regurgitation. - Mitral valve: Calcified annulus. Mildly thickened leaflets .  There was mild regurgitation. - Left atrium: The atrium was moderately dilated. - Atrial septum: No defect or patent foramen ovale was identified. - Pulmonary arteries: PA peak pressure: 49 mm Hg (S).  Assessment and Plan:   1. Severe aortic valve stenosis: She has been doing well one year post TAVR. She is NYHA Class II. She is on Plavix. Echo today with normally functioning AVR with no change in post-procedure gradient. no significant AI.   Current medicines are reviewed at length with the patient today.  The patient does not have concerns regarding medicines.  The following changes have been made:  no change  Labs/ tests ordered today include:  No orders of the defined types were placed in this encounter.    Disposition:   FU with me for cardiac cath.   Signed, Lauree Chandler, MD 05/27/2016 3:58 PM    [facility] [place], [place], [place]  [address] Phone: [phone]; Fax: [phone]

## 2016-05-27 NOTE — Patient Instructions (Signed)
Medication Instructions:  Your physician recommends that you continue on your current medications as directed. Please refer to the Current Medication list given to you today.   Labwork: none  Testing/Procedures: none  Follow-Up: Your physician wants you to follow-up in: 6 months with Dr. Hilty. You will receive a reminder letter in the mail two months in advance. If you don't receive a letter, please call our office to schedule the follow-up appointment.   Any Other Special Instructions Will Be Listed Below (If Applicable).     If you need a refill on your cardiac medications before your next appointment, please call your pharmacy.   

## 2016-06-14 DIAGNOSIS — R350 Frequency of micturition: Secondary | ICD-10-CM | POA: Diagnosis not present

## 2016-06-21 ENCOUNTER — Other Ambulatory Visit: Payer: Self-pay | Admitting: Internal Medicine

## 2016-07-05 ENCOUNTER — Telehealth: Payer: Self-pay

## 2016-07-05 NOTE — Telephone Encounter (Signed)
Spoke with patient and scheduled appt 07/04/16 4:55pm

## 2016-07-05 NOTE — Telephone Encounter (Signed)
Patient's daughter called stating that she was returning Katherine [name]'s or Katherine [name]'s call, she wants a return call at your earliest convenience.

## 2016-07-06 ENCOUNTER — Ambulatory Visit: Payer: Medicare Other | Admitting: *Deleted

## 2016-07-06 DIAGNOSIS — E1140 Type 2 diabetes mellitus with diabetic neuropathy, unspecified: Secondary | ICD-10-CM

## 2016-07-08 NOTE — Progress Notes (Signed)
Patient presents to be scanned and measured for diabetic shoes and inserts.

## 2016-07-20 DIAGNOSIS — N390 Urinary tract infection, site not specified: Secondary | ICD-10-CM | POA: Diagnosis not present

## 2016-07-20 DIAGNOSIS — M25569 Pain in unspecified knee: Secondary | ICD-10-CM | POA: Diagnosis not present

## 2016-07-20 DIAGNOSIS — N39498 Other specified urinary incontinence: Secondary | ICD-10-CM | POA: Diagnosis not present

## 2016-07-26 DIAGNOSIS — N39498 Other specified urinary incontinence: Secondary | ICD-10-CM | POA: Diagnosis not present

## 2016-07-26 DIAGNOSIS — N390 Urinary tract infection, site not specified: Secondary | ICD-10-CM | POA: Diagnosis not present

## 2016-07-27 ENCOUNTER — Other Ambulatory Visit: Payer: Self-pay | Admitting: Internal Medicine

## 2016-07-27 NOTE — Telephone Encounter (Signed)
Rx(s) sent to pharmacy electronically.  

## 2016-08-11 ENCOUNTER — Encounter: Payer: Self-pay | Admitting: Podiatry

## 2016-08-11 ENCOUNTER — Ambulatory Visit (INDEPENDENT_AMBULATORY_CARE_PROVIDER_SITE_OTHER): Payer: Medicare Other | Admitting: Podiatry

## 2016-08-11 DIAGNOSIS — B351 Tinea unguium: Secondary | ICD-10-CM | POA: Diagnosis not present

## 2016-08-11 DIAGNOSIS — E1140 Type 2 diabetes mellitus with diabetic neuropathy, unspecified: Secondary | ICD-10-CM

## 2016-08-11 DIAGNOSIS — M79676 Pain in unspecified toe(s): Secondary | ICD-10-CM | POA: Diagnosis not present

## 2016-08-11 NOTE — Progress Notes (Signed)
Patient ID: Katherine [name], female   DOB: 12-23-1927, 82 y.o.   MRN: [id number] Complaint:  Visit Type: Patient returns to my office for continued preventative foot care services. Complaint: Patient states" my nails have grown long and thick and become painful to walk and wear shoes" Patient has been diagnosed with DM with no foot complications. The patient presents for preventative foot care services. No changes to ROS  Podiatric Exam: Vascular: dorsalis pedis and posterior tibial pulses are not  palpable bilateral due to foot swelling.. Capillary return is immediate. Temperature gradient is WNL. Skin turgor WNL  Sensorium: Diminished Semmes Weinstein monofilament test. Normal tactile sensation bilaterally. Nail Exam: Pt has thick disfigured discolored nails with subungual debris noted bilateral entire nail hallux through fifth toenails Ulcer Exam: There is no evidence of ulcer or pre-ulcerative changes or infection. Orthopedic Exam: Muscle tone and strength are WNL. No limitations in general ROM. No crepitus or effusions noted. Foot type and digits show no abnormalities. Bony prominences are unremarkable. HAV deformity B/LSkin: No Porokeratosis. No infection or ulcers  Diagnosis:  Onychomycosis, , Pain in right toe, pain in left toes Diabetes with neuropathy. HAV  B/L,  Angiopathy  Treatment & Plan Procedures and Treatment: Consent by patient was obtained for treatment procedures. The patient understood the discussion of treatment and procedures well. All questions were answered thoroughly reviewed. Debridement of mycotic and hypertrophic toenails, 1 through 5 bilateral and clearing of subungual debris. No ulceration, no infection noted.   Return Visit-Office Procedure: Patient instructed to return to the office for a follow up visit 3 months for continued evaluation and treatment.   Gardiner Barefoot DPM

## 2016-08-12 DIAGNOSIS — R300 Dysuria: Secondary | ICD-10-CM | POA: Diagnosis not present

## 2016-08-12 DIAGNOSIS — E119 Type 2 diabetes mellitus without complications: Secondary | ICD-10-CM | POA: Diagnosis not present

## 2016-08-12 DIAGNOSIS — D509 Iron deficiency anemia, unspecified: Secondary | ICD-10-CM | POA: Diagnosis not present

## 2016-08-12 DIAGNOSIS — E782 Mixed hyperlipidemia: Secondary | ICD-10-CM | POA: Diagnosis not present

## 2016-08-16 DIAGNOSIS — E119 Type 2 diabetes mellitus without complications: Secondary | ICD-10-CM | POA: Diagnosis not present

## 2016-08-16 DIAGNOSIS — D509 Iron deficiency anemia, unspecified: Secondary | ICD-10-CM | POA: Diagnosis not present

## 2016-08-16 DIAGNOSIS — I5042 Chronic combined systolic (congestive) and diastolic (congestive) heart failure: Secondary | ICD-10-CM | POA: Diagnosis not present

## 2016-08-16 DIAGNOSIS — I10 Essential (primary) hypertension: Secondary | ICD-10-CM | POA: Diagnosis not present

## 2016-08-16 DIAGNOSIS — F0390 Unspecified dementia without behavioral disturbance: Secondary | ICD-10-CM | POA: Diagnosis not present

## 2016-08-16 DIAGNOSIS — R600 Localized edema: Secondary | ICD-10-CM | POA: Diagnosis not present

## 2016-09-27 ENCOUNTER — Encounter: Payer: Self-pay | Admitting: Podiatry

## 2016-09-27 ENCOUNTER — Ambulatory Visit (INDEPENDENT_AMBULATORY_CARE_PROVIDER_SITE_OTHER): Payer: Medicare Other | Admitting: Podiatry

## 2016-09-27 DIAGNOSIS — I2510 Atherosclerotic heart disease of native coronary artery without angina pectoris: Secondary | ICD-10-CM | POA: Diagnosis not present

## 2016-09-27 DIAGNOSIS — Z794 Long term (current) use of insulin: Principal | ICD-10-CM

## 2016-09-27 DIAGNOSIS — L03039 Cellulitis of unspecified toe: Secondary | ICD-10-CM

## 2016-09-27 DIAGNOSIS — E0851 Diabetes mellitus due to underlying condition with diabetic peripheral angiopathy without gangrene: Secondary | ICD-10-CM

## 2016-09-27 DIAGNOSIS — E1140 Type 2 diabetes mellitus with diabetic neuropathy, unspecified: Secondary | ICD-10-CM

## 2016-09-27 DIAGNOSIS — M2010 Hallux valgus (acquired), unspecified foot: Secondary | ICD-10-CM | POA: Diagnosis not present

## 2016-09-27 NOTE — Progress Notes (Signed)
Patient ID: Katherine [name], female   DOB: Nov 24, 1928, 82 y.o.   MRN: [id number] Complaint:  Visit Type: Patient returns to my office for r3eceiving her diabetic shoes.  Podiatric Exam: Vascular: dorsalis pedis and posterior tibial pulses are not  palpable bilateral due to foot swelling.. Capillary return is immediate. Temperature gradient is WNL. Skin turgor WNL  Sensorium: Diminished Semmes Weinstein monofilament test. Normal tactile sensation bilaterally. Nail Exam: Pt has thick disfigured discolored nails with subungual debris noted bilateral entire nail hallux through fifth toenails Ulcer Exam: There is no evidence of ulcer or pre-ulcerative changes or infection. Orthopedic Exam: Muscle tone and strength are WNL. No limitations in general ROM. No crepitus or effusions noted. Foot type and digits show no abnormalities. Bony prominences are unremarkable. HAV deformity B/LSkin: No Porokeratosis. No infection or ulcers  Diagnosis:  Onychomycosis, , Pain in right toe, pain in left toes Diabetes with neuropathy. HAV  B/L,  Angiopathy  Treatment & Plan Procedures and Treatment: Diabetic shoes were dispensed. Patient presents today and was dispensed 0ne pair ( two units) of medically necessary extra depth shoes with three pair( six units) of custom molded multiple density inserts. The shoes and the inserts are fitted to the patients ' feet and are noted to fit well and are free of defect.  Length and width of the shoes are also acceptable.  Patient was given written and verbal  instructions for wearing.  If any concerns arrive with the shoes or inserts, the patient is to call the office.Patient is to follow up with doctor in six weeks. Return Visit-Office Procedure: Patient instructed to return to the office for a follow up visit 3 months for continued evaluation and treatment.   Gardiner Barefoot DPM

## 2016-11-14 DIAGNOSIS — I10 Essential (primary) hypertension: Secondary | ICD-10-CM | POA: Diagnosis not present

## 2016-11-15 ENCOUNTER — Encounter: Payer: Self-pay | Admitting: Podiatry

## 2016-11-15 ENCOUNTER — Ambulatory Visit (INDEPENDENT_AMBULATORY_CARE_PROVIDER_SITE_OTHER): Payer: Medicare Other | Admitting: Podiatry

## 2016-11-15 DIAGNOSIS — B351 Tinea unguium: Secondary | ICD-10-CM

## 2016-11-15 DIAGNOSIS — M79676 Pain in unspecified toe(s): Secondary | ICD-10-CM

## 2016-11-15 DIAGNOSIS — E1140 Type 2 diabetes mellitus with diabetic neuropathy, unspecified: Secondary | ICD-10-CM

## 2016-11-15 NOTE — Progress Notes (Signed)
Patient ID: Katherine [name], female   DOB: 09-11-1928, 82 y.o.   MRN: [id number] Complaint:  Visit Type: Patient returns to my office for continued preventative foot care services. Complaint: Patient states" my nails have grown long and thick and become painful to walk and wear shoes" Patient has been diagnosed with DM with no foot complications. The patient presents for preventative foot care services. No changes to ROS  Podiatric Exam: Vascular: dorsalis pedis and posterior tibial pulses are not  palpable bilateral due to foot swelling.. Capillary return is immediate. Temperature gradient is WNL. Skin turgor WNL  Sensorium: Diminished Semmes Weinstein monofilament test. Normal tactile sensation bilaterally. Nail Exam: Pt has thick disfigured discolored nails with subungual debris noted bilateral entire nail hallux through fifth toenails Ulcer Exam: There is no evidence of ulcer or pre-ulcerative changes or infection. Orthopedic Exam: Muscle tone and strength are WNL. No limitations in general ROM. No crepitus or effusions noted. Foot type and digits show no abnormalities. Bony prominences are unremarkable. HAV deformity B/LSkin: No Porokeratosis. No infection or ulcers  Diagnosis:  Onychomycosis, , Pain in right toe, pain in left toes Diabetes with neuropathy. HAV  B/L,  Angiopathy  Treatment & Plan Procedures and Treatment: Consent by patient was obtained for treatment procedures. The patient understood the discussion of treatment and procedures well. All questions were answered thoroughly reviewed. Debridement of mycotic and hypertrophic toenails, 1 through 5 bilateral and clearing of subungual debris. No ulceration, no infection noted.   Return Visit-Office Procedure: Patient instructed to return to the office for a follow up visit 3 months for continued evaluation and treatment.   Gardiner Barefoot DPM

## 2016-11-16 DIAGNOSIS — D509 Iron deficiency anemia, unspecified: Secondary | ICD-10-CM | POA: Diagnosis not present

## 2016-11-16 DIAGNOSIS — I10 Essential (primary) hypertension: Secondary | ICD-10-CM | POA: Diagnosis not present

## 2016-11-16 DIAGNOSIS — R3981 Functional urinary incontinence: Secondary | ICD-10-CM | POA: Diagnosis not present

## 2016-11-16 DIAGNOSIS — E782 Mixed hyperlipidemia: Secondary | ICD-10-CM | POA: Diagnosis not present

## 2016-11-16 DIAGNOSIS — Z23 Encounter for immunization: Secondary | ICD-10-CM | POA: Diagnosis not present

## 2016-11-16 DIAGNOSIS — I5042 Chronic combined systolic (congestive) and diastolic (congestive) heart failure: Secondary | ICD-10-CM | POA: Diagnosis not present

## 2016-11-16 DIAGNOSIS — F0390 Unspecified dementia without behavioral disturbance: Secondary | ICD-10-CM | POA: Diagnosis not present

## 2016-11-16 DIAGNOSIS — E119 Type 2 diabetes mellitus without complications: Secondary | ICD-10-CM | POA: Diagnosis not present

## 2016-11-16 DIAGNOSIS — M25569 Pain in unspecified knee: Secondary | ICD-10-CM | POA: Diagnosis not present

## 2016-11-20 ENCOUNTER — Observation Stay (HOSPITAL_COMMUNITY)
Admission: EM | Admit: 2016-11-20 | Discharge: 2016-11-21 | Disposition: A | Discharge status: Home / Self Care | Payer: Medicare Other | Attending: Internal Medicine | Admitting: Internal Medicine

## 2016-11-20 ENCOUNTER — Emergency Department (HOSPITAL_COMMUNITY): Payer: Medicare Other

## 2016-11-20 ENCOUNTER — Encounter (HOSPITAL_COMMUNITY): Payer: Self-pay | Admitting: *Deleted

## 2016-11-20 DIAGNOSIS — I110 Hypertensive heart disease with heart failure: Secondary | ICD-10-CM | POA: Diagnosis not present

## 2016-11-20 DIAGNOSIS — M1990 Unspecified osteoarthritis, unspecified site: Secondary | ICD-10-CM | POA: Insufficient documentation

## 2016-11-20 DIAGNOSIS — Z7902 Long term (current) use of antithrombotics/antiplatelets: Secondary | ICD-10-CM | POA: Insufficient documentation

## 2016-11-20 DIAGNOSIS — F0390 Unspecified dementia without behavioral disturbance: Secondary | ICD-10-CM | POA: Insufficient documentation

## 2016-11-20 DIAGNOSIS — Z794 Long term (current) use of insulin: Secondary | ICD-10-CM | POA: Insufficient documentation

## 2016-11-20 DIAGNOSIS — I5033 Acute on chronic diastolic (congestive) heart failure: Secondary | ICD-10-CM | POA: Insufficient documentation

## 2016-11-20 DIAGNOSIS — Z952 Presence of prosthetic heart valve: Secondary | ICD-10-CM | POA: Diagnosis not present

## 2016-11-20 DIAGNOSIS — I509 Heart failure, unspecified: Secondary | ICD-10-CM

## 2016-11-20 DIAGNOSIS — D649 Anemia, unspecified: Secondary | ICD-10-CM | POA: Diagnosis not present

## 2016-11-20 DIAGNOSIS — Z833 Family history of diabetes mellitus: Secondary | ICD-10-CM | POA: Insufficient documentation

## 2016-11-20 DIAGNOSIS — I5032 Chronic diastolic (congestive) heart failure: Secondary | ICD-10-CM

## 2016-11-20 DIAGNOSIS — Z823 Family history of stroke: Secondary | ICD-10-CM | POA: Diagnosis not present

## 2016-11-20 DIAGNOSIS — I160 Hypertensive urgency: Secondary | ICD-10-CM | POA: Diagnosis not present

## 2016-11-20 DIAGNOSIS — R069 Unspecified abnormalities of breathing: Secondary | ICD-10-CM | POA: Diagnosis not present

## 2016-11-20 DIAGNOSIS — E785 Hyperlipidemia, unspecified: Secondary | ICD-10-CM | POA: Insufficient documentation

## 2016-11-20 DIAGNOSIS — Z66 Do not resuscitate: Secondary | ICD-10-CM | POA: Diagnosis not present

## 2016-11-20 DIAGNOSIS — I10 Essential (primary) hypertension: Secondary | ICD-10-CM | POA: Diagnosis present

## 2016-11-20 DIAGNOSIS — Z6838 Body mass index (BMI) 38.0-38.9, adult: Secondary | ICD-10-CM | POA: Insufficient documentation

## 2016-11-20 DIAGNOSIS — Z8249 Family history of ischemic heart disease and other diseases of the circulatory system: Secondary | ICD-10-CM | POA: Diagnosis not present

## 2016-11-20 DIAGNOSIS — Z951 Presence of aortocoronary bypass graft: Secondary | ICD-10-CM | POA: Diagnosis not present

## 2016-11-20 DIAGNOSIS — J209 Acute bronchitis, unspecified: Secondary | ICD-10-CM | POA: Insufficient documentation

## 2016-11-20 DIAGNOSIS — K5900 Constipation, unspecified: Secondary | ICD-10-CM | POA: Diagnosis not present

## 2016-11-20 DIAGNOSIS — K210 Gastro-esophageal reflux disease with esophagitis: Secondary | ICD-10-CM | POA: Insufficient documentation

## 2016-11-20 DIAGNOSIS — R0602 Shortness of breath: Secondary | ICD-10-CM | POA: Diagnosis not present

## 2016-11-20 DIAGNOSIS — IMO0002 Reserved for concepts with insufficient information to code with codable children: Secondary | ICD-10-CM | POA: Diagnosis present

## 2016-11-20 DIAGNOSIS — Z8719 Personal history of other diseases of the digestive system: Secondary | ICD-10-CM | POA: Diagnosis not present

## 2016-11-20 DIAGNOSIS — E1165 Type 2 diabetes mellitus with hyperglycemia: Secondary | ICD-10-CM | POA: Diagnosis not present

## 2016-11-20 DIAGNOSIS — I2510 Atherosclerotic heart disease of native coronary artery without angina pectoris: Secondary | ICD-10-CM | POA: Diagnosis not present

## 2016-11-20 HISTORY — DX: Heart failure, unspecified: I50.9

## 2016-11-20 LAB — CBC WITH DIFFERENTIAL/PLATELET
BASOS ABS: 0 10*3/uL (ref 0.0–0.1)
Basophils Relative: 0 %
Eosinophils Absolute: 0.1 10*3/uL (ref 0.0–0.7)
Eosinophils Relative: 1 %
HEMATOCRIT: 37.4 % (ref 36.0–46.0)
HEMOGLOBIN: 13 g/dL (ref 12.0–15.0)
LYMPHS PCT: 16 %
Lymphs Abs: 2.1 10*3/uL (ref 0.7–4.0)
MCH: 27.7 pg (ref 26.0–34.0)
MCHC: 34.8 g/dL (ref 30.0–36.0)
MCV: 79.6 fL (ref 78.0–100.0)
MONO ABS: 1.2 10*3/uL — AB (ref 0.1–1.0)
Monocytes Relative: 9 %
NEUTROS ABS: 9.8 10*3/uL — AB (ref 1.7–7.7)
NEUTROS PCT: 74 %
Platelets: 292 10*3/uL (ref 150–400)
RBC: 4.7 MIL/uL (ref 3.87–5.11)
RDW: 13.9 % (ref 11.5–15.5)
WBC: 13.2 10*3/uL — ABNORMAL HIGH (ref 4.0–10.5)

## 2016-11-20 LAB — BASIC METABOLIC PANEL
ANION GAP: 9 (ref 5–15)
BUN: 11 mg/dL (ref 6–20)
CHLORIDE: 100 mmol/L — AB (ref 101–111)
CO2: 29 mmol/L (ref 22–32)
Calcium: 10.5 mg/dL — ABNORMAL HIGH (ref 8.9–10.3)
Creatinine, Ser: 0.87 mg/dL (ref 0.44–1.00)
GFR calc Af Amer: >60 mL/min (ref >60)
GFR calc non Af Amer: 59 mL/min — ABNORMAL LOW (ref >60)
GLUCOSE: 163 mg/dL — AB (ref 65–99)
POTASSIUM: 3.8 mmol/L (ref 3.5–5.1)
Sodium: 138 mmol/L (ref 135–145)

## 2016-11-20 LAB — GLUCOSE, CAPILLARY
GLUCOSE-CAPILLARY: 152 mg/dL — AB (ref 65–99)
GLUCOSE-CAPILLARY: 170 mg/dL — AB (ref 65–99)
Glucose-Capillary: 149 mg/dL — ABNORMAL HIGH (ref 65–99)

## 2016-11-20 LAB — I-STAT ARTERIAL BLOOD GAS, ED
ACID-BASE EXCESS: 2 mmol/L (ref 0.0–2.0)
BICARBONATE: 27.4 mmol/L (ref 20.0–28.0)
O2 Saturation: 96 %
PH ART: 7.375 (ref 7.350–7.450)
TCO2: 29 mmol/L (ref 0–100)
pCO2 arterial: 47.1 mmHg (ref 32.0–48.0)
pO2, Arterial: 86 mmHg (ref 83.0–108.0)

## 2016-11-20 LAB — I-STAT TROPONIN, ED: TROPONIN I, POC: 0.02 ng/mL (ref 0.00–0.08)

## 2016-11-20 LAB — BRAIN NATRIURETIC PEPTIDE: B Natriuretic Peptide: 211.3 pg/mL — ABNORMAL HIGH (ref 0.0–100.0)

## 2016-11-20 MED ORDER — ACETAMINOPHEN 325 MG PO TABS: 650.0000 mg | ORAL_TABLET | Freq: Four times a day (QID) | ORAL | Status: DC | PRN

## 2016-11-20 MED ORDER — LORATADINE 10 MG PO TABS
10.0000 mg | ORAL_TABLET | Freq: Every day | ORAL | Status: DC
  Administered 2016-11-21: 10 mg via ORAL
  Filled 2016-11-20: qty 1

## 2016-11-20 MED ORDER — PANTOPRAZOLE SODIUM 40 MG PO TBEC
40.0000 mg | DELAYED_RELEASE_TABLET | Freq: Every day | ORAL | Status: DC
  Administered 2016-11-21: 40 mg via ORAL
  Filled 2016-11-20: qty 1

## 2016-11-20 MED ORDER — CLOPIDOGREL BISULFATE 75 MG PO TABS
75.0000 mg | ORAL_TABLET | Freq: Every day | ORAL | Status: DC
  Administered 2016-11-20 – 2016-11-21 (×2): 75 mg via ORAL
  Filled 2016-11-20 (×2): qty 1

## 2016-11-20 MED ORDER — MEMANTINE HCL ER 28 MG PO CP24
28.0000 mg | ORAL_CAPSULE | Freq: Every day | ORAL | Status: DC
  Administered 2016-11-21: 28 mg via ORAL
  Filled 2016-11-20 (×2): qty 1

## 2016-11-20 MED ORDER — NEBIVOLOL HCL 10 MG PO TABS
10.0000 mg | ORAL_TABLET | Freq: Every day | ORAL | Status: DC
  Administered 2016-11-20 – 2016-11-21 (×2): 10 mg via ORAL
  Filled 2016-11-20 (×2): qty 1

## 2016-11-20 MED ORDER — ATORVASTATIN CALCIUM 80 MG PO TABS
80.0000 mg | ORAL_TABLET | Freq: Every day | ORAL | Status: DC
  Administered 2016-11-20 – 2016-11-21 (×2): 80 mg via ORAL
  Filled 2016-11-20 (×2): qty 1

## 2016-11-20 MED ORDER — ACETAMINOPHEN 650 MG RE SUPP: 650.0000 mg | Freq: Four times a day (QID) | RECTAL | Status: DC | PRN

## 2016-11-20 MED ORDER — POTASSIUM CHLORIDE CRYS ER 20 MEQ PO TBCR
20.0000 meq | EXTENDED_RELEASE_TABLET | Freq: Every day | ORAL | Status: DC
Start: 2016-11-20 — End: 2016-11-21
  Administered 2016-11-21: 20 meq via ORAL
  Filled 2016-11-20: qty 1

## 2016-11-20 MED ORDER — IPRATROPIUM-ALBUTEROL 20-100 MCG/ACT IN AERS: 2.0000 | INHALATION_SPRAY | Freq: Four times a day (QID) | RESPIRATORY_TRACT | Status: DC | PRN

## 2016-11-20 MED ORDER — IPRATROPIUM-ALBUTEROL 0.5-2.5 (3) MG/3ML IN SOLN: 3.0000 mL | Freq: Four times a day (QID) | RESPIRATORY_TRACT | Status: DC | PRN

## 2016-11-20 MED ORDER — ENOXAPARIN SODIUM 40 MG/0.4ML ~~LOC~~ SOLN
40.0000 mg | SUBCUTANEOUS | Status: DC
  Administered 2016-11-20: 40 mg via SUBCUTANEOUS
  Filled 2016-11-20: qty 0.4

## 2016-11-20 MED ORDER — INSULIN ASPART 100 UNIT/ML ~~LOC~~ SOLN
0.0000 [IU] | Freq: Three times a day (TID) | SUBCUTANEOUS | Status: DC
  Administered 2016-11-20 – 2016-11-21 (×2): 3 [IU] via SUBCUTANEOUS
  Administered 2016-11-21: 2 [IU] via SUBCUTANEOUS

## 2016-11-20 MED ORDER — FUROSEMIDE 10 MG/ML IJ SOLN
40.0000 mg | Freq: Two times a day (BID) | INTRAMUSCULAR | Status: AC
Start: 2016-11-20 — End: 2016-11-21
  Administered 2016-11-20 – 2016-11-21 (×2): 40 mg via INTRAVENOUS
  Filled 2016-11-20 (×2): qty 4

## 2016-11-20 MED ORDER — ALBUTEROL SULFATE (2.5 MG/3ML) 0.083% IN NEBU: 2.5000 mg | INHALATION_SOLUTION | Freq: Four times a day (QID) | RESPIRATORY_TRACT | Status: DC | PRN

## 2016-11-20 MED ORDER — GUAIFENESIN ER 600 MG PO TB12
600.0000 mg | ORAL_TABLET | Freq: Two times a day (BID) | ORAL | Status: DC
Start: 2016-11-20 — End: 2016-11-21
  Administered 2016-11-20 – 2016-11-21 (×2): 600 mg via ORAL
  Filled 2016-11-20 (×2): qty 1

## 2016-11-20 MED ORDER — INSULIN DETEMIR 100 UNIT/ML ~~LOC~~ SOLN
15.0000 [IU] | Freq: Two times a day (BID) | SUBCUTANEOUS | Status: DC
  Administered 2016-11-20 – 2016-11-21 (×2): 15 [IU] via SUBCUTANEOUS
  Filled 2016-11-20 (×4): qty 0.15

## 2016-11-20 MED ORDER — LINACLOTIDE 145 MCG PO CAPS
145.0000 ug | ORAL_CAPSULE | Freq: Every day | ORAL | Status: DC | PRN
Start: 2016-11-20 — End: 2016-11-21
  Filled 2016-11-20: qty 1

## 2016-11-20 MED ORDER — ALPRAZOLAM 0.25 MG PO TABS: 0.1250 mg | ORAL_TABLET | Freq: Two times a day (BID) | ORAL | Status: DC | PRN

## 2016-11-20 NOTE — ED Notes (Signed)
IV attempted without success. 

## 2016-11-20 NOTE — ED Triage Notes (Signed)
Pt to ED from home by [facility] c/o shortness of breath. Pt's daughter gave an albuterol at 9pm and 3am, sob worsened after second treatment. EMS placed pt on bipap, crackles in all lobes. ENd tidal 60, 90% on 15L. 1 inch nitro applied to chest. Initial bp 937 systolic, decreased with nitro paste

## 2016-11-20 NOTE — H&P (Signed)
Date: 11/20/2016               Patient Name:  Katherine [name] MRN: [id number]  DOB: 07/16/1928 Age / Sex: 82 y.o., female   PCP: Celene Squibb, MD         Medical Service: Internal Medicine Teaching Service         Attending Physician: Dr. Lucious Groves, DO    First Contact: Dr. Holley Raring Pager: [phone]  Second Contact: Dr. Berline Lopes Pager: [phone]       After Hours (After 5p/  First Contact Pager: [phone]  weekends / holidays): Second Contact Pager: [phone]   Chief Complaint: SOB  History of Present Illness: Katherine [name] is a 82 y.o. female with a h/o of HFpEF, AS s/p AVR, CAD s/p CABG, HTN, HLD, t2DM, Dementia who presents with 1 day h/o SOB.  Patient reported waking suddenly this morning with significant shortness of breath. Patient endorses that over the last day she is developed worsening cough. Patient's sister describes patient's cough as progressive since Saturday afternoon and she treated with duo nebs at home which brought good relief. Patient was unable to sleep but awoke the next morning significantly short of breath. EMS reports that they came to the house to find the patient with O2 sats in the 70s to 80s and coarse rales. She was placed on CPAP and given Nitropaste for systolic blood pressure 403. On arrival to the emergency department patient's blood pressure had normalized and was satting fine on 2 L nasal cannula.  Patient reports that her cough has not been productive although she feels like there is congestion in her chest. She has a history of bronchitis in the past, and was given a nebulizer for that purpose. She has not used his nebulizer in quite a while and does not take any daily medications for pulmonary disease. She was less at her PCP on Thursday and was told that she was doing well without any significant acute issues. She takes daily Lasix BID, but reports that occasionally on Saturdays she does not take second dose because she  skips a meal and her sister is concerned about hypertension. Sister reports that she missed several of her afternoon medications yesterday, including her Lasix and her hydralazine.  On our interview patient is resting comfortably on [place] O2, denies any shortness of breath, denies chest pain. She is coughing but denies any sputum production, sore throat, sinus congestion, headache, visual changes, nausea, vomiting, diarrhea, abdominal pain, changes to urination, dysuria.  Meds: Current Facility-Administered Medications  Medication Dose Route Frequency Provider Last Rate Last Dose  . acetaminophen (TYLENOL) tablet 650 mg  650 mg Oral Q6H PRN Shela Leff, MD       Or  . acetaminophen (TYLENOL) suppository 650 mg  650 mg Rectal Q6H PRN Shela Leff, MD      . albuterol (PROVENTIL) (2.5 MG/3ML) 0.083% nebulizer solution 2.5 mg  2.5 mg Nebulization Q6H PRN Shela Leff, MD      . ALPRAZolam Duanne Moron) tablet 0.125 mg  0.125 mg Oral BID PRN Shela Leff, MD      . atorvastatin (LIPITOR) tablet 80 mg  80 mg Oral Daily Shela Leff, MD      . Derrill Memo ON 11/21/2016] clopidogrel (PLAVIX) tablet 75 mg  75 mg Oral Q breakfast Shela Leff, MD      . enoxaparin (LOVENOX) injection 40 mg  40 mg Subcutaneous Q24H Shela Leff, MD      .  furosemide (LASIX) injection 40 mg  40 mg Intravenous BID Shela Leff, MD      . guaiFENesin (MUCINEX) 12 hr tablet 600 mg  600 mg Oral BID Shela Leff, MD      . insulin aspart (novoLOG) injection 0-15 Units  0-15 Units Subcutaneous TID WC Shela Leff, MD      . insulin detemir (LEVEMIR) injection 15 Units  15 Units Subcutaneous BID Shela Leff, MD      . Ipratropium-Albuterol (COMBIVENT) respimat 2 puff  2 puff Inhalation Q6H PRN Shela Leff, MD      . linaclotide Rolan Lipa) capsule 145 mcg  145 mcg Oral Daily PRN Shela Leff, MD      . loratadine (CLARITIN) tablet 10 mg  10 mg Oral Daily Shela Leff, MD       . memantine (NAMENDA XR) 24 hr capsule 28 mg  28 mg Oral Daily Shela Leff, MD      . pantoprazole (PROTONIX) EC tablet 40 mg  40 mg Oral Daily Shela Leff, MD      . potassium chloride SA (K-DUR,KLOR-CON) CR tablet 20 mEq  20 mEq Oral Daily Shela Leff, MD       Prescriptions Prior to Admission  Medication Sig Dispense Refill Last Dose  . acetaminophen (TYLENOL) 500 MG tablet Take two tablets by mouth twice daily for pain. Do not exceed 4gm of Tylenol in 24 hours   11/19/2016 at pm  . ALPRAZolam (XANAX) 0.25 MG tablet Take 0.5 tablets (0.125 mg total) by mouth 2 (two) times daily as needed for anxiety or sleep. (Patient taking differently: Take 0.25 mg by mouth 3 (three) times daily. ) 10 tablet 0 11/19/2016 at pm  . atorvastatin (LIPITOR) 80 MG tablet Take 80 mg by mouth daily.    11/18/2016 at 1200  . clopidogrel (PLAVIX) 75 MG tablet Take 1 tablet (75 mg total) by mouth daily with breakfast. 30 tablet 11 11/19/2016 at am  . furosemide (LASIX) 20 MG tablet Take 2 tablets (40 mg total) by mouth daily. <PLEASE MAKE APPOINTMENT FOR REFILLS> (Patient taking differently: Take 40 mg by mouth daily. ) 180 tablet 0 11/19/2016 at am  . guaiFENesin (MUCINEX) 600 MG 12 hr tablet Take 1 tablet (600 mg total) by mouth 2 (two) times daily. (Patient taking differently: Take 600 mg by mouth 2 (two) times daily as needed. For acute pulmonary edema. Do not crush.)   11/18/2016 at pm  . hydrALAZINE (APRESOLINE) 50 MG tablet Take 1 tablet (50 mg total) by mouth every 8 (eight) hours. (Patient taking differently: Take 50 mg by mouth every 8 (eight) hours. For blood pressure) 90 tablet 11 11/19/2016 at pm  . insulin detemir (LEVEMIR) 100 UNIT/ML injection Inject 0.3 mLs (30 Units total) into the skin 2 (two) times daily. (Patient taking differently: Inject 30 Units into the skin 2 (two) times daily with a meal. DEPENDING ON BGL) 10 mL 11 11/19/2016 at pm  . Ipratropium-Albuterol (COMBIVENT RESPIMAT)  20-100 MCG/ACT AERS respimat Inhale 2 puffs into the lungs every 6 (six) hours as needed for wheezing or shortness of breath. 1 Inhaler 2 11/19/2016 at am  . Linaclotide (LINZESS) 145 MCG CAPS capsule Take 145 mcg by mouth daily as needed (for constipation).   Past Week at Unknown time  . lisinopril (PRINIVIL,ZESTRIL) 10 MG tablet Take 1 tablet (10 mg total) by mouth daily. 30 tablet 11 11/19/2016 at am  . loperamide (IMODIUM) 2 MG capsule Take 2 mg by mouth as needed for diarrhea  or loose stools.   PRN at PRN  . Memantine HCl ER (NAMENDA XR) 28 MG CP24 Take one capsule by mouth every morning for dementia   11/19/2016 at am  . metFORMIN (GLUCOPHAGE-XR) 500 MG 24 hr tablet Take 500 mg by mouth daily with breakfast. To control blood sugar. Do not crush.   11/19/2016 at am  . nebivolol (BYSTOLIC) 10 MG tablet Take one tablet by mouth every morning for blood pressure   11/19/2016 at am  . NEXIUM 20 MG capsule Take one capsule by mouth once daily for reflux   11/19/2016 at am  . NYSTATIN powder Apply 1 application topically 2 (two) times daily as needed for for breast care.   Past Week at Unknown time  . potassium chloride SA (K-DUR,KLOR-CON) 20 MEQ tablet Take 20 mEq by mouth daily.   11/19/2016 at am  . VOLTAREN 1 % GEL Apply 2 g topically daily as needed (pain).    Past Week at Unknown time  . fexofenadine (ALLEGRA) 180 MG tablet Take 180 mg by mouth daily.   Not Taking at Unknown time  . lactulose (CHRONULAC) 10 GM/15ML solution Take 45 mLs (30 g total) by mouth daily as needed for mild constipation or severe constipation. (Patient not taking: Reported on 11/20/2016) 240 mL 0 Not Taking at Unknown time  . meloxicam (MOBIC) 7.5 MG tablet Take 7.5 mg by mouth daily as needed for pain.    NOT YET at NOT YET  . polyethylene glycol (MIRALAX / GLYCOLAX) packet Take 17 g by mouth daily. (Patient not taking: Reported on 11/20/2016) 14 each 0 Not Taking at Unknown time  . traMADol (ULTRAM) 50 MG tablet Take 1-2  tablets (50-100 mg total) by mouth every 4 (four) hours as needed for moderate pain. (Patient not taking: Reported on 11/20/2016) 20 tablet 0 Not Taking at Unknown time   Allergies: Allergies as of 11/20/2016 - Review Complete 11/20/2016  Allergen Reaction Noted  . Tape Other (See Comments) 11/20/2016   Past Medical History:  Diagnosis Date  . Anemia   . Anxiety   . Aortic valvular stenosis    moderate to severe  EF on 2D Echo 10/22/2012 65-70%  valve area of 1.2cm, and peak and mean gradients of 45 and 75mmHg  . Chalazion of right upper eyelid early June 2016   treating with cream, Rx by Dr. Merlyn Albert    . CHF (congestive heart failure) ([place])   . Coronary artery disease   . Dementia   . Diabetes mellitus, type 2 ([place])   . Dyspnea    chronic  . Easy fatigability   . Gastric mass   . Gastric ulcer   . GERD (gastroesophageal reflux disease)   . Heart murmur   . Hernia of unspecified site of abdominal cavity without mention of obstruction or gangrene    hiatal  . Hyperlipidemia   . Hypertension   . Normal cardiac stress test 12/2013   normal stress nuclear study  . Osteoarthritis   . Thyroid mass    Family History: Family History  Problem Relation Age of Onset  . Diabetes Mother   . Heart disease Mother   . Stroke Mother   . Cerebral aneurysm Mother   . Stroke Father   . Heart attack Brother   . Colon cancer Neg Hx    Social History: Social History  . Marital status: Widowed  . Number of children: 5   Occupational History  . Health and safety inspector and CNA  Social History Main Topics  . Smoking status: Never Smoker  . Smokeless tobacco: Never Used  . Alcohol use No  . Drug use: No   Review of Systems: A complete ROS was negative except as per HPI. Review of Systems  Constitutional: Negative for chills, fever, malaise/fatigue and weight loss.  Eyes: Negative for blurred vision.  Respiratory: Positive for cough and shortness of breath. Negative for hemoptysis,  sputum production and wheezing.   Cardiovascular: Positive for leg swelling. Negative for chest pain, palpitations and orthopnea.  Gastrointestinal: Negative for abdominal pain, constipation, diarrhea, nausea and vomiting.  Genitourinary: Negative for dysuria, frequency and urgency.  Musculoskeletal: Negative for myalgias.  Skin: Negative for rash.  Neurological: Negative for dizziness, tremors, weakness and headaches.  Endo/Heme/Allergies: Negative for polydipsia.  Psychiatric/Behavioral: The patient is not nervous/anxious.     Physical Exam: Vitals:   11/20/16 0900 11/20/16 0930 11/20/16 1000 11/20/16 1038  BP: 107/86 (!) 120/52 (!) 114/52 (!) 126/49  Pulse: 78 77 76 76  Resp: 20 20 22 21   Temp:      SpO2: 95% 94% 94% 92%   Physical Exam  Constitutional: She is oriented to person, place, and time. She appears well-developed. She is cooperative. No distress.  Resting comfortably on [place] O2  HENT:  Head: Normocephalic and atraumatic.  Right Ear: Hearing normal.  Left Ear: Hearing normal.  Nose: Nose normal.  Mouth/Throat: Mucous membranes are normal.  Cardiovascular: Normal rate, regular rhythm, S1 normal, S2 normal and intact distal pulses.  Exam reveals no gallop.   No murmur heard. Pulmonary/Chest: Effort normal and breath sounds normal. No respiratory distress. She has no wheezes. She has no rhonchi. She has no rales. She exhibits no tenderness. Breasts are symmetrical.  Abdominal: Soft. Normal appearance and bowel sounds are normal. She exhibits no ascites. There is no hepatosplenomegaly. There is no tenderness. There is no CVA tenderness.  Musculoskeletal: Normal range of motion. She exhibits edema (2+ BLE).  Neurological: She is alert and oriented to person, place, and time. She has normal strength.  Skin: Skin is warm, dry and intact. She is not diaphoretic.  Psychiatric: She has a normal mood and affect. Her behavior is normal. Her speech is delayed. Cognition and memory  are impaired (mild).   Labs: CBC:  Recent Labs Lab 11/20/16 0437  WBC 13.2*  NEUTROABS 9.8*  HGB 13.0  HCT 37.4  MCV 79.6  PLT 469   Basic Metabolic Panel:  Recent Labs Lab 11/20/16 0437  NA 138  K 3.8  CL 100*  CO2 29  GLUCOSE 163*  BUN 11  CREATININE 0.87  CALCIUM 10.5*   Cardiac Enzymes:  Recent Labs Lab 11/20/16 0440  TROPIPOC 0.02   BNP (last 3 results)  Recent Labs  12/16/15 1710 11/20/16 0437  BNP 51.0 211.3*   CBG: Lab Results  Component Value Date   HGBA1C 6.7 (H) 12/16/2015   EKG: EKG Interpretation  Date/Time:  Sunday November 20 2016 04:31:52 EST Ventricular Rate:  113 PR Interval:    QRS Duration: 71 QT Interval:  323 QTC Calculation: 443 R Axis:   -8 Text Interpretation:  Sinus tachycardia Abnormal R-wave progression, early transition Left ventricular hypertrophy Baseline wander in lead(s) V1 No significant change since last tracing Confirmed by POLLINA  MD, CHRISTOPHER [phone]) on 11/20/2016 4:38:44 AM  Imaging: Dg Chest Port 1 View  Result Date: 11/20/2016 CLINICAL DATA:  Shortness of breath over the last day. EXAM: PORTABLE CHEST 1 VIEW COMPARISON:  12/16/2015 FINDINGS:  Previous median sternotomy and CABG. The heart is enlarged. There is atherosclerosis and tortuosity of the aorta. There is venous hypertension, possibly with very early interstitial edema. No infiltrate, collapse or effusion. IMPRESSION: Suspicion of mild CHF. Cardiomegaly. Venous hypertension. Early interstitial edema. Electronically Signed   By: Nelson Chimes M.D.   On: 11/20/2016 06:24   Assessment & Plan by Problem: Principal Problem:   Acute exacerbation of CHF (congestive heart failure) (HCC) Active Problems:   Diabetes mellitus type 2, uncontrolled ([place])   Essential hypertension   Acute bronchitis  Katherine [name] is a 82 y.o. female with h/o CHF, CAD, t2DM who presents w/ SOB found to be 2/2 acute exacerbation of HFpEF.  1) Acute exacerbation  of chronic HFpEF: CXR and BNP suggests exacerbation of HF w/ pulm edema leading to SOB. Likely contributed by missing lasix and hydralazine, w/ accelarated BPs causing flash edema. BP now normalized and lung exam CTAB. Plan to diuresis today w/ IV lasix, follow wt, UOP, SCr. - admit to med-surg - strict I&O, daily wts - IV Lasix 40mg  BID  2) Acute bronchitis: Complains of cough and upper airway congestion suggestive of bronchitis. Symptoms reportedly releived w/ nebulizers at home. WBC 13.2, afebrile, no other s/s of infx. Will treat symptomatically w/ PRN nebs and guafenasin. - duonebs PRN - guafenasin PRN  3) HTN: SBP significantly elevated to 217mmHg per EMS report, responded to nitropaste. Normal since admission. Likely contributed to flash edema. Will restart home meds if HTN and CTM. Home meds: hydralazine 50mg  TID, lisinopril 10mg  qD, nebivolol 10mg  qD.  4) t2DM: Home levemir (30U BID) and metformin. Will give 15U BID + SSI-m.  5) CAD: continue home plavix, statin  DVT PPx - low molecular weight heparin  Code Status - DNR, discussed w/ pt  Dispo: Admit patient to observation  Signed: Holley Raring, MD 11/20/2016, 11:34 AM  Pager: [phone]

## 2016-11-20 NOTE — ED Provider Notes (Signed)
[facility] DEPT Provider Note   CSN: [id number] Arrival date & time: 11/20/16  0423     History   Chief Complaint Chief Complaint  Patient presents with  . Shortness of Breath    HPI Katherine [name] is a 82 y.o. female.  Patient presents to the emergency department for evaluation of shortness of breath. Patient reportedly has had some cough and shortness of breath in the last couple of days. She did several nebulizer treatments this evening, awakened approximately one hour before arrival in the ER with severe shortness of breath. Patient is brought in by EMS. They report that she was extremely hypertensive and they were concerned for congestive heart failure on their examination. She was placed on CPAP, Nitropaste and has improved. At arrival to the ER, patient denies chest pain. She is demented, however, cannot provide further information. Level V Caveat due to dementia.      Past Medical History:  Diagnosis Date  . Anemia   . Anxiety   . Aortic valvular stenosis    moderate to severe  EF on 2D Echo 10/22/2012 65-70%  valve area of 1.2cm, and peak and mean gradients of 45 and 34mmHg  . Chalazion of right upper eyelid early June 2016   treating with cream, Rx by Dr. Merlyn Albert    . CHF (congestive heart failure) ([place])   . Coronary artery disease   . Dementia   . Diabetes mellitus, type 2 ([place])   . Dyspnea    chronic  . Easy fatigability   . Gastric mass   . Gastric ulcer   . GERD (gastroesophageal reflux disease)   . Heart murmur   . Hernia of unspecified site of abdominal cavity without mention of obstruction or gangrene    hiatal  . Hyperlipidemia   . Hypertension   . Normal cardiac stress test 12/2013   normal stress nuclear study  . Osteoarthritis   . Thyroid mass     Patient Active Problem List   Diagnosis Date Noted  . Bronchospasm 12/16/2015  . Acute bronchitis 12/16/2015  . S/P TAVR (transcatheter aortic valve replacement) 09/02/2015  .  Constipation 06/04/2015  . Postoperative pulmonary edema ([place]) 06/04/2015  . Severe aortic stenosis   . SOB (shortness of breath)   . Severe aortic valve stenosis 05/26/2015  . Bilateral leg edema 10/17/2014  . Acute diastolic congestive heart failure, NYHA class 3 ([place]) 10/17/2014  . CHF with left ventricular diastolic dysfunction, NYHA class 2 ([place]) 09/09/2014  . Chest pain 12/04/2013  . Difficulty walking 10/28/2013  . Muscle weakness (generalized) 10/28/2013  . Morbid obesity ([place]) 10/24/2013  . Moderate to severe aortic stenosis 04/22/2013  . S/P CABG x 4 04/22/2013  . Gastric mass 08/18/2011  . Diabetes mellitus, type 2 ([place])   . CHF 03/04/2010  . COLONIC POLYPS, HX OF 03/04/2010  . THYROID MASS 02/25/2010  . Diabetes mellitus type 2, uncontrolled ([place]) 02/25/2010  . Hyperlipidemia 02/25/2010  . UNSPECIFIED ANEMIA 02/25/2010  . Anxiety state 02/25/2010  . Essential hypertension 02/25/2010  . Coronary atherosclerosis 02/25/2010  . REFLUX ESOPHAGITIS 02/25/2010  . HIATAL HERNIA 02/25/2010  . OSTEOARTHRITIS 02/25/2010  . History of peptic ulcer disease 02/25/2010    Past Surgical History:  Procedure Laterality Date  . AORTIC VALVE SURGERY    . CATARACT EXTRACTION Bilateral   . CORONARY ARTERY BYPASS GRAFT     x4 in 2003  . internal hemorrhoidectomy    . KNEE SURGERY Right   . LEFT  AND RIGHT HEART CATHETERIZATION WITH CORONARY/GRAFT ANGIOGRAM N/A 03/24/2015   Procedure: LEFT AND RIGHT HEART CATHETERIZATION WITH Beatrix Fetters;  Surgeon: Burnell Blanks, MD;  Location: [facility] CATH LAB;  Service: Cardiovascular;  Laterality: N/A;  . TEE WITHOUT CARDIOVERSION N/A 05/26/2015   Procedure: TRANSESOPHAGEAL ECHOCARDIOGRAM (TEE);  Surgeon: Burnell Blanks, MD;  Location: [facility];  Service: Open Heart Surgery;  Laterality: N/A;  . TRANSCATHETER AORTIC VALVE REPLACEMENT, TRANSFEMORAL N/A 05/26/2015   Procedure: TRANSCATHETER AORTIC VALVE REPLACEMENT, TRANSFEMORAL;   Surgeon: Burnell Blanks, MD;  Location: [facility];  Service: Open Heart Surgery;  Laterality: N/A;  . TUBAL LIGATION    . uterine polypectomy      OB History    No data available       Home Medications    Prior to Admission medications   Medication Sig Start Date End Date Taking? Authorizing Provider  acetaminophen (TYLENOL) 500 MG tablet Take two tablets by mouth twice daily for pain. Do not exceed 4gm of Tylenol in 24 hours   Yes Historical Provider, MD  ALPRAZolam (XANAX) 0.25 MG tablet Take 0.5 tablets (0.125 mg total) by mouth 2 (two) times daily as needed for anxiety or sleep. Patient taking differently: Take 0.25 mg by mouth 3 (three) times daily.  06/03/15  Yes Rhonda G Barrett, PA-C  atorvastatin (LIPITOR) 80 MG tablet Take 80 mg by mouth daily.    Yes Historical Provider, MD  clopidogrel (PLAVIX) 75 MG tablet Take 1 tablet (75 mg total) by mouth daily with breakfast. 06/03/15  Yes Rhonda G Barrett, PA-C  furosemide (LASIX) 20 MG tablet Take 2 tablets (40 mg total) by mouth daily. <PLEASE MAKE APPOINTMENT FOR REFILLS> Patient taking differently: Take 40 mg by mouth daily.  07/27/16  Yes [name], MD  guaiFENesin (MUCINEX) 600 MG 12 hr tablet Take 1 tablet (600 mg total) by mouth 2 (two) times daily. Patient taking differently: Take 600 mg by mouth 2 (two) times daily as needed. For acute pulmonary edema. Do not crush. 06/03/15  Yes Rhonda G Barrett, PA-C  hydrALAZINE (APRESOLINE) 50 MG tablet Take 1 tablet (50 mg total) by mouth every 8 (eight) hours. Patient taking differently: Take 50 mg by mouth every 8 (eight) hours. For blood pressure 06/03/15  Yes Rhonda G Barrett, PA-C  insulin detemir (LEVEMIR) 100 UNIT/ML injection Inject 0.3 mLs (30 Units total) into the skin 2 (two) times daily. Patient taking differently: Inject 30 Units into the skin 2 (two) times daily with a meal. DEPENDING ON BGL 06/03/15  Yes Rhonda G Barrett, PA-C  Ipratropium-Albuterol (COMBIVENT RESPIMAT)  20-100 MCG/ACT AERS respimat Inhale 2 puffs into the lungs every 6 (six) hours as needed for wheezing or shortness of breath. 10/17/14  Yes [name], MD  Linaclotide [facility]) 145 MCG CAPS capsule Take 145 mcg by mouth daily as needed (for constipation).   Yes Historical Provider, MD  lisinopril (PRINIVIL,ZESTRIL) 10 MG tablet Take 1 tablet (10 mg total) by mouth daily. 06/03/15  Yes Rhonda G Barrett, PA-C  loperamide (IMODIUM) 2 MG capsule Take 2 mg by mouth as needed for diarrhea or loose stools.   Yes Historical Provider, MD  Memantine HCl ER (NAMENDA XR) 28 MG CP24 Take one capsule by mouth every morning for dementia   Yes Historical Provider, MD  metFORMIN (GLUCOPHAGE-XR) 500 MG 24 hr tablet Take 500 mg by mouth daily with breakfast. To control blood sugar. Do not crush. 03/16/15  Yes Historical Provider, MD  nebivolol (BYSTOLIC)  10 MG tablet Take one tablet by mouth every morning for blood pressure   Yes Historical Provider, MD  NEXIUM 20 MG capsule Take one capsule by mouth once daily for reflux 04/25/14  Yes Historical Provider, MD  NYSTATIN powder Apply 1 application topically 2 (two) times daily as needed for for breast care. 11/15/16  Yes Historical Provider, MD  potassium chloride SA (K-DUR,KLOR-CON) 20 MEQ tablet Take 20 mEq by mouth daily.   Yes Historical Provider, MD  VOLTAREN 1 % GEL Apply 2 g topically daily as needed (pain).  03/25/13  Yes Historical Provider, MD  fexofenadine (ALLEGRA) 180 MG tablet Take 180 mg by mouth daily.    Historical Provider, MD  lactulose (CHRONULAC) 10 GM/15ML solution Take 45 mLs (30 g total) by mouth daily as needed for mild constipation or severe constipation. Patient not taking: Reported on 11/20/2016 06/03/15   Evelene Croon Barrett, PA-C  meloxicam (MOBIC) 7.5 MG tablet Take 7.5 mg by mouth daily as needed for pain.  11/18/16   Historical Provider, MD  polyethylene glycol (MIRALAX / GLYCOLAX) packet Take 17 g by mouth daily. Patient not taking:  Reported on 11/20/2016 06/03/15   Evelene Croon Barrett, PA-C  traMADol (ULTRAM) 50 MG tablet Take 1-2 tablets (50-100 mg total) by mouth every 4 (four) hours as needed for moderate pain. Patient not taking: Reported on 11/20/2016 06/03/15   Evelene Croon Barrett, PA-C    Family History Family History  Problem Relation Age of Onset  . Diabetes Mother   . Heart disease Mother   . Stroke Mother   . Cerebral aneurysm Mother   . Stroke Father   . Heart attack Brother   . Colon cancer Neg Hx     Social History Social History  Substance Use Topics  . Smoking status: Never Smoker  . Smokeless tobacco: Never Used  . Alcohol use No     Allergies   Tape   Review of Systems Review of Systems  Unable to perform ROS: Dementia     Physical Exam Updated Vital Signs BP 127/55   Pulse 89   Temp 99.3 F (37.4 C)   Resp 24   SpO2 100%   Physical Exam  Constitutional: She is oriented to person, place, and time. She appears well-developed and well-nourished. No distress.  HENT:  Head: Normocephalic and atraumatic.  Right Ear: Hearing normal.  Left Ear: Hearing normal.  Nose: Nose normal.  Mouth/Throat: Oropharynx is clear and moist and mucous membranes are normal.  Eyes: Conjunctivae and EOM are normal. Pupils are equal, round, and reactive to light.  Neck: Normal range of motion. Neck supple.  Cardiovascular: Regular rhythm, S1 normal and S2 normal.  Tachycardia present.  Exam reveals no gallop and no friction rub.   No murmur heard. Pulmonary/Chest: Accessory muscle usage present. No respiratory distress. She has decreased breath sounds. She has rhonchi. She exhibits no tenderness.  Abdominal: Soft. Normal appearance and bowel sounds are normal. There is no hepatosplenomegaly. There is no tenderness. There is no rebound, no guarding, no tenderness at McBurney's point and negative Murphy's sign. No hernia.  Musculoskeletal: Normal range of motion. She exhibits edema.  Neurological: She is  alert and oriented to person, place, and time. She has normal strength. No cranial nerve deficit or sensory deficit. Coordination normal. GCS eye subscore is 4. GCS verbal subscore is 5. GCS motor subscore is 6.  Skin: Skin is warm, dry and intact. No rash noted. No cyanosis.  Psychiatric: She has  a normal mood and affect. Her speech is normal and behavior is normal. Thought content normal.  Nursing note and vitals reviewed.    ED Treatments / Results  Labs (all labs ordered are listed, but only abnormal results are displayed) Labs Reviewed  CBC WITH DIFFERENTIAL/PLATELET - Abnormal; Notable for the following:       Result Value   WBC 13.2 (*)    Neutro Abs 9.8 (*)    Monocytes Absolute 1.2 (*)    All other components within normal limits  BASIC METABOLIC PANEL - Abnormal; Notable for the following:    Chloride 100 (*)    Glucose, Bld 163 (*)    Calcium 10.5 (*)    GFR calc non Af Amer 59 (*)    All other components within normal limits  BRAIN NATRIURETIC PEPTIDE - Abnormal; Notable for the following:    B Natriuretic Peptide 211.3 (*)    All other components within normal limits  I-STAT TROPOININ, ED  I-STAT ARTERIAL BLOOD GAS, ED    EKG  EKG Interpretation  Date/Time:  Sunday November 20 2016 04:31:52 EST Ventricular Rate:  113 PR Interval:    QRS Duration: 71 QT Interval:  323 QTC Calculation: 443 R Axis:   -8 Text Interpretation:  Sinus tachycardia Abnormal R-wave progression, early transition Left ventricular hypertrophy Baseline wander in lead(s) V1 No significant change since last tracing Confirmed by Damarea Merkel  MD, Santos Hardwick [phone]) on 11/20/2016 4:38:44 AM       Radiology Dg Chest Port 1 View  Result Date: 11/20/2016 CLINICAL DATA:  Shortness of breath over the last day. EXAM: PORTABLE CHEST 1 VIEW COMPARISON:  12/16/2015 FINDINGS: Previous median sternotomy and CABG. The heart is enlarged. There is atherosclerosis and tortuosity of the aorta. There is venous  hypertension, possibly with very early interstitial edema. No infiltrate, collapse or effusion. IMPRESSION: Suspicion of mild CHF. Cardiomegaly. Venous hypertension. Early interstitial edema. Electronically Signed   By: Nelson Chimes M.D.   On: 11/20/2016 06:24    Procedures Procedures (including critical care time)  Medications Ordered in ED Medications - No data to display   Initial Impression / Assessment and Plan / ED Course  I have reviewed the triage vital signs and the nursing notes.  Pertinent labs & imaging results that were available during my care of the patient were reviewed by me and considered in my medical decision making (see chart for details).  Clinical Course    Patient was brought to the emergency department from home for shortness of breath. Family reports that she has had a cough for approximately 1 day. She has used nebulizers a couple of times today, only rarely needs to use them. Prior to arrival to the ER, however, she became acutely very short of breath. She is brought to the ER by ambulance. EMS report that she was hypoxic in the 70s and 80s with diffuse rales on their initial examination. Systolic blood pressure was 240. Patient was brought to the ER on CPAP and administered nitroglycerin by nitro paste with improvement of blood pressure and breathing.  Here in the ER her blood pressure has been normal. Chest x-ray does shows some elements of heart failure but no significant pulmonary edema. Patient likely experienced some element of congestive heart failure secondary to the accelerated blood pressure that was appropriately treated by EMS. Will request hospitalization for further monitoring of blood pressure and diuresis.  Final Clinical Impressions(s) / ED Diagnoses   Final diagnoses:  Hypertensive urgency  Acute on chronic diastolic congestive heart failure [facility])    New Prescriptions New Prescriptions   No medications on file     Orpah Greek,  MD 11/20/16 [phone]

## 2016-11-21 DIAGNOSIS — Z952 Presence of prosthetic heart valve: Secondary | ICD-10-CM

## 2016-11-21 DIAGNOSIS — Z7902 Long term (current) use of antithrombotics/antiplatelets: Secondary | ICD-10-CM

## 2016-11-21 DIAGNOSIS — J209 Acute bronchitis, unspecified: Secondary | ICD-10-CM | POA: Diagnosis not present

## 2016-11-21 DIAGNOSIS — Z794 Long term (current) use of insulin: Secondary | ICD-10-CM | POA: Diagnosis not present

## 2016-11-21 DIAGNOSIS — I5033 Acute on chronic diastolic (congestive) heart failure: Secondary | ICD-10-CM | POA: Diagnosis not present

## 2016-11-21 DIAGNOSIS — Z79899 Other long term (current) drug therapy: Secondary | ICD-10-CM | POA: Diagnosis not present

## 2016-11-21 DIAGNOSIS — Z91048 Other nonmedicinal substance allergy status: Secondary | ICD-10-CM

## 2016-11-21 DIAGNOSIS — I110 Hypertensive heart disease with heart failure: Secondary | ICD-10-CM | POA: Diagnosis not present

## 2016-11-21 DIAGNOSIS — D72829 Elevated white blood cell count, unspecified: Secondary | ICD-10-CM | POA: Diagnosis not present

## 2016-11-21 DIAGNOSIS — E119 Type 2 diabetes mellitus without complications: Secondary | ICD-10-CM

## 2016-11-21 DIAGNOSIS — I5032 Chronic diastolic (congestive) heart failure: Secondary | ICD-10-CM

## 2016-11-21 DIAGNOSIS — Z833 Family history of diabetes mellitus: Secondary | ICD-10-CM

## 2016-11-21 DIAGNOSIS — I2510 Atherosclerotic heart disease of native coronary artery without angina pectoris: Secondary | ICD-10-CM

## 2016-11-21 DIAGNOSIS — Z951 Presence of aortocoronary bypass graft: Secondary | ICD-10-CM

## 2016-11-21 DIAGNOSIS — Z823 Family history of stroke: Secondary | ICD-10-CM

## 2016-11-21 DIAGNOSIS — F0390 Unspecified dementia without behavioral disturbance: Secondary | ICD-10-CM

## 2016-11-21 DIAGNOSIS — Z8249 Family history of ischemic heart disease and other diseases of the circulatory system: Secondary | ICD-10-CM

## 2016-11-21 DIAGNOSIS — Z66 Do not resuscitate: Secondary | ICD-10-CM

## 2016-11-21 DIAGNOSIS — Z800 Family history of malignant neoplasm of digestive organs: Secondary | ICD-10-CM

## 2016-11-21 LAB — CBC
HEMATOCRIT: 33.6 % — AB (ref 36.0–46.0)
Hemoglobin: 11.4 g/dL — ABNORMAL LOW (ref 12.0–15.0)
MCH: 27.3 pg (ref 26.0–34.0)
MCHC: 33.9 g/dL (ref 30.0–36.0)
MCV: 80.4 fL (ref 78.0–100.0)
Platelets: 254 10*3/uL (ref 150–400)
RBC: 4.18 MIL/uL (ref 3.87–5.11)
RDW: 14.4 % (ref 11.5–15.5)
WBC: 14.4 10*3/uL — AB (ref 4.0–10.5)

## 2016-11-21 LAB — BASIC METABOLIC PANEL
ANION GAP: 10 (ref 5–15)
BUN: 14 mg/dL (ref 6–20)
CHLORIDE: 99 mmol/L — AB (ref 101–111)
CO2: 30 mmol/L (ref 22–32)
Calcium: 10.2 mg/dL (ref 8.9–10.3)
Creatinine, Ser: 0.89 mg/dL (ref 0.44–1.00)
GFR calc Af Amer: >60 mL/min (ref >60)
GFR, EST NON AFRICAN AMERICAN: 57 mL/min — AB (ref >60)
GLUCOSE: 134 mg/dL — AB (ref 65–99)
POTASSIUM: 3.8 mmol/L (ref 3.5–5.1)
Sodium: 139 mmol/L (ref 135–145)

## 2016-11-21 LAB — URINALYSIS, ROUTINE W REFLEX MICROSCOPIC
BILIRUBIN URINE: NEGATIVE
GLUCOSE, UA: NEGATIVE mg/dL
KETONES UR: NEGATIVE mg/dL
NITRITE: NEGATIVE
PH: 6 (ref 5.0–8.0)
Protein, ur: 30 mg/dL — AB
Specific Gravity, Urine: 1.006 (ref 1.005–1.030)
Squamous Epithelial / LPF: NONE SEEN

## 2016-11-21 LAB — GLUCOSE, CAPILLARY
Glucose-Capillary: 150 mg/dL — ABNORMAL HIGH (ref 65–99)
Glucose-Capillary: 180 mg/dL — ABNORMAL HIGH (ref 65–99)

## 2016-11-21 MED ORDER — LISINOPRIL 10 MG PO TABS
10.0000 mg | ORAL_TABLET | Freq: Every day | ORAL | Status: DC
  Administered 2016-11-21: 10 mg via ORAL
  Filled 2016-11-21: qty 1

## 2016-11-21 MED ORDER — HYDRALAZINE HCL 50 MG PO TABS
50.0000 mg | ORAL_TABLET | Freq: Three times a day (TID) | ORAL | Status: DC
  Administered 2016-11-21: 50 mg via ORAL
  Filled 2016-11-21: qty 1

## 2016-11-21 MED ORDER — FUROSEMIDE 40 MG PO TABS
40.0000 mg | ORAL_TABLET | Freq: Every day | ORAL | Status: DC
  Filled 2016-11-21: qty 1

## 2016-11-21 NOTE — Progress Notes (Signed)
Pt family members had questions for discharge and MD talked to them via phone. Pt discharge education went over at bedside with family members, including caregiver. IV discontinued, catheter intact, and telemetry removed. Pt has all belongings in hand and discharge paperwork. Pt discharged via wheelchair with family and nurse tech.  Katherine [name]

## 2016-11-21 NOTE — Progress Notes (Signed)
MD Gay Filler called stated to start PO lasix tomorrow 11/22/2016 and give hydralazine and lisinopril now. MD reviewed UA and stated patient is clear for discharge.  Katherine [name]

## 2016-11-21 NOTE — Discharge Summary (Signed)
Name: Katherine [name] MRN: [id number] DOB: [birth year] 82 y.o. PCP: Celene Squibb, MD  Date of Admission: 11/20/2016  4:23 AM Date of Discharge: 11/21/2016 Attending Physician: Lucious Groves, DO  Discharge Diagnosis: Principal Problem:   Acute on chronic diastolic congestive heart failure ([place]) Active Problems:   Diabetes mellitus type 2, uncontrolled ([place])   Essential hypertension   Acute bronchitis   Discharge Medications: Allergies as of 11/21/2016      Reactions   Tape Other (See Comments)   SKIN IS VERY THIN AND WILL TEAR EASILY!!      Medication List    TAKE these medications   acetaminophen 500 MG tablet Commonly known as:  TYLENOL Take two tablets by mouth twice daily for pain. Do not exceed 4gm of Tylenol in 24 hours   ALPRAZolam 0.25 MG tablet Commonly known as:  XANAX Take 0.5 tablets (0.125 mg total) by mouth 2 (two) times daily as needed for anxiety or sleep. What changed:  how much to take  when to take this   atorvastatin 80 MG tablet Commonly known as:  LIPITOR Take 80 mg by mouth daily.   clopidogrel 75 MG tablet Commonly known as:  PLAVIX Take 1 tablet (75 mg total) by mouth daily with breakfast.   fexofenadine 180 MG tablet Commonly known as:  ALLEGRA Take 180 mg by mouth daily.   furosemide 20 MG tablet Commonly known as:  LASIX Take 2 tablets (40 mg total) by mouth daily. <PLEASE MAKE APPOINTMENT FOR REFILLS> What changed:  additional instructions   guaiFENesin 600 MG 12 hr tablet Commonly known as:  MUCINEX Take 1 tablet (600 mg total) by mouth 2 (two) times daily. What changed:  when to take this  reasons to take this  additional instructions   hydrALAZINE 50 MG tablet Commonly known as:  APRESOLINE Take 1 tablet (50 mg total) by mouth every 8 (eight) hours. What changed:  additional instructions   insulin detemir 100 UNIT/ML injection Commonly known as:  LEVEMIR Inject 0.3 mLs (30 Units total) into the skin 2  (two) times daily. What changed:  when to take this  additional instructions   Ipratropium-Albuterol 20-100 MCG/ACT Aers respimat Commonly known as:  COMBIVENT RESPIMAT Inhale 2 puffs into the lungs every 6 (six) hours as needed for wheezing or shortness of breath.   lactulose 10 GM/15ML solution Commonly known as:  CHRONULAC Take 45 mLs (30 g total) by mouth daily as needed for mild constipation or severe constipation.   LINZESS 145 MCG Caps capsule Generic drug:  linaclotide Take 145 mcg by mouth daily as needed (for constipation).   lisinopril 10 MG tablet Commonly known as:  PRINIVIL,ZESTRIL Take 1 tablet (10 mg total) by mouth daily.   loperamide 2 MG capsule Commonly known as:  IMODIUM Take 2 mg by mouth as needed for diarrhea or loose stools.   meloxicam 7.5 MG tablet Commonly known as:  MOBIC Take 7.5 mg by mouth daily as needed for pain.   metFORMIN 500 MG 24 hr tablet Commonly known as:  GLUCOPHAGE-XR Take 500 mg by mouth daily with breakfast. To control blood sugar. Do not crush.   NAMENDA XR 28 MG Cp24 24 hr capsule Generic drug:  memantine Take one capsule by mouth every morning for dementia   nebivolol 10 MG tablet Commonly known as:  BYSTOLIC Take one tablet by mouth every morning for blood pressure   NEXIUM 20 MG capsule Generic drug:  esomeprazole Take one capsule by  mouth once daily for reflux   nystatin powder Generic drug:  nystatin Apply 1 application topically 2 (two) times daily as needed for for breast care.   polyethylene glycol packet Commonly known as:  MIRALAX / GLYCOLAX Take 17 g by mouth daily.   potassium chloride SA 20 MEQ tablet Commonly known as:  K-DUR,KLOR-CON Take 20 mEq by mouth daily.   traMADol 50 MG tablet Commonly known as:  ULTRAM Take 1-2 tablets (50-100 mg total) by mouth every 4 (four) hours as needed for moderate pain.   VOLTAREN 1 % Gel Generic drug:  diclofenac sodium Apply 2 g topically daily as needed  (pain).      Disposition and follow-up:   Katherine [name] was discharged from [facility] in Good condition.  At the hospital follow up visit please address:  1.  Acute on chronic HFpEF exacerbation: assess for compliance w/ home meds, will need to have continued education on importance of not missing meds. Check fluid status. Acute bronchitis: assess for resolution/progression of symptoms, consider further symptomatic treatment for cough/congestion if indicated.  Follow-up Appointments: Follow-up Information    Wende Neighbors, MD. Go on 12/01/2016.   Specialty:  Internal Medicine Why:  Go for a follow up appointment at 11:00am Contact information: [place] [id number] [phone]          Hospital Course by problem list: Principal Problem:   Acute on chronic diastolic congestive heart failure (HCC) Active Problems:   Diabetes mellitus type 2, uncontrolled ([place])   Essential hypertension   Acute bronchitis   1) Acute exacerbation of chronic HFpEF: Pt presented w/ SOB and significantly elevated SBP to 240. She was found on CXR and BNP to have likely flash pulmonary edema and exacerbation of HF. This was likely contributed by missing lasix and hydralazine on Saturday afternoon. She was treated w/ IV diuresis and BP control and has returned to her baseline w/o SOB or O2 requirement. She was discharged on her prior home regimen w/ f/u at her PCP.  2) Acute bronchitis: Pt was complaining of cough and upper airway congestion suggestive of bronchitis. Her symptoms were reportedly releived w/ nebulizers at home. WBC 13.2-->14.4, but she was afebrile with no other s/s of infx. We did send a UA given the patient's baseline dementia with concern that she may not complain of UTI symptoms. This was not concerning for significant infection. CXR was not suggestive of a bacterial pneumonia. We feel viral URI is most likely and have treated symptomatically  w/ PRN nebs and guafenasin.  Discharge Vitals:   BP (!) 168/55 (BP Location: Left Arm)   Pulse 76   Temp 98.3 F (36.8 C) (Oral)   Resp 18   Ht 5\' 2"  (1.575 m)   Wt 208 lb 6.4 oz (94.5 kg) Comment: bedscale  SpO2 99%   BMI 38.12 kg/m   Pertinent Labs, Studies, and Procedures: As above.  Procedures Performed:  Dg Chest Port 1 View IMPRESSION: Suspicion of mild CHF. Cardiomegaly. Venous hypertension. Early interstitial edema.  Discharge Instructions: Discharge Instructions    Call MD for:  difficulty breathing, headache or visual disturbances    Complete by:  As directed    Call MD for:  temperature >100.4    Complete by:  As directed    Diet - low sodium heart healthy    Complete by:  As directed    Discharge instructions    Complete by:  As directed  You were hospitalized for fluid buildup in your lungs. This might have occurred because your blood pressures were very elevated. It is very important that you take all of your medications as prescribed, especially your blood pressure and fluid pills. We have arranged follow up with your primary doctor in order to review your medications and make sure that your symptoms have not returned.   Increase activity slowly    Complete by:  As directed      Signed: Holley Raring, MD 11/21/2016, 11:34 AM   Pager: [phone]

## 2016-11-21 NOTE — Progress Notes (Signed)
Subjective: Currently, the patient is feeling well w/o complain of SOB. She looks comfortable in bed and is agreeable to discharge today. She still has some cough, productive of white sputum, but denies fever/chills.  Objective: Vital signs in last 24 hours: Vitals:   11/20/16 1300 11/20/16 1959 11/20/16 2359 11/21/16 0444  BP: (!) 152/56 (!) 167/52 (!) 178/63 (!) 168/55  Pulse: 77 78 86 76  Resp: (!) 22 18 20 18   Temp: 98.3 F (36.8 C) 99.4 F (37.4 C) 99.6 F (37.6 C) 98.3 F (36.8 C)  TempSrc: Oral Oral Oral Oral  SpO2: 100% 98% 97% 99%  Weight:    208 lb 6.4 oz (94.5 kg)  Height: 5\' 2"  (1.575 m)      Physical Exam: Physical Exam  Constitutional: She appears well-developed. She is cooperative. No distress.  Cardiovascular: Normal rate, regular rhythm, normal heart sounds and normal pulses.  Exam reveals no gallop.   No murmur heard. Pulmonary/Chest: Effort normal and breath sounds normal. No respiratory distress. Breasts are symmetrical.  Abdominal: Soft. Bowel sounds are normal. There is no tenderness.  Musculoskeletal: She exhibits no edema.   Labs: CBC:  Recent Labs Lab 11/20/16 0437 11/21/16 0255  WBC 13.2* 14.4*  NEUTROABS 9.8*  --   HGB 13.0 11.4*  HCT 37.4 33.6*  MCV 79.6 80.4  PLT 322 025   Metabolic Panel:  Recent Labs Lab 11/20/16 0437 11/21/16 0255  NA 138 139  K 3.8 3.8  CL 100* 99*  CO2 29 30  GLUCOSE 163* 134*  BUN 11 14  CREATININE 0.87 0.89  CALCIUM 10.5* 10.2   Cardiac Labs:  Recent Labs Lab 11/20/16 0437 11/20/16 0440  TROPIPOC  --  0.02  BNP 211.3*  --    BG:  Recent Labs Lab 11/20/16 1147 11/20/16 1616 11/20/16 2142 11/21/16 0638  GLUCAP 152* 170* 149* 150*   Lab Results  Component Value Date   HGBA1C 6.7 (H) 12/16/2015    Medications: Scheduled Medications: . atorvastatin  80 mg Oral Daily  . clopidogrel  75 mg Oral Q breakfast  . enoxaparin (LOVENOX) injection  40 mg Subcutaneous Q24H  . guaiFENesin  600  mg Oral BID  . insulin aspart  0-15 Units Subcutaneous TID WC  . insulin detemir  15 Units Subcutaneous BID  . loratadine  10 mg Oral Daily  . memantine  28 mg Oral Daily  . nebivolol  10 mg Oral Daily  . pantoprazole  40 mg Oral Daily  . potassium chloride SA  20 mEq Oral Daily   PRN Medications: acetaminophen **OR** acetaminophen, albuterol, ALPRAZolam, ipratropium-albuterol, linaclotide  Assessment/Plan: [name] is a 82 y.o. female with h/o CHF, CAD, t2DM who presents w/ SOB found to be 2/2 acute exacerbation of HFpEF.  1) Acute exacerbation of chronic HFpEF: CXR and BNP suggests exacerbation of HF w/ pulm edema leading to SOB. Likely contributed by missing lasix and hydralazine, w/ accelarated BPs causing flash edema. BP now normalized and lung exam CTAB. Plan to diuresis today w/ IV lasix, follow wt, UOP, SCr. - Restart home dose lasix  2) Acute bronchitis: Complains of cough and upper airway congestion suggestive of bronchitis. Symptoms reportedly releived w/ nebulizers at home. WBC 13.2-->14.4, afebrile, no other s/s of infx. Will treat symptomatically w/ PRN nebs and guafenasin. - UA to r/o UTI given dementia - duonebs PRN - guafenasin PRN  3) HTN: SBP significantly elevated to 288mmHg per EMS report, responded to nitropaste. Normal since admission.  Likely contributed to flash edema. Restart home meds if HTN and CTM. Home meds: hydralazine 50mg  TID, lisinopril 10mg  qD, nebivolol 10mg  qD.  4) t2DM: Home levemir (30U BID) and metformin. Will give 15U BID + SSI-m.  5) CAD: continue home plavix, statin  Length of Stay: 0 day(s) Dispo: Anticipated discharge today.  Holley Raring, MD Pager: [phone] (7AM-5PM) 11/21/2016, 11:18 AM

## 2016-11-21 NOTE — H&P (Addendum)
Internal Medicine Attending Admission Note  I saw and evaluated the patient. I reviewed the resident's note and I agree with the resident's findings and plan as documented in the resident's note.  Assessment & Plan by Problem:   Acute exacerbation of diastolic CHF (congestive heart failure) ([place]) - Suspect a component of flash pulmonary edema due to hypertension in the setting of missed antihypertensive medication.  Today on exam she is off supplemental O2 and resting comfortably.  I&O is not documented in the chart.  Unfortunately it does not appear a weight was obtained on admission, today's weight is 208. - Stable for discharge today on home medications with follow up as outpatient.  Leukocytosis, Acute bronchitis - No evidence of acute infection, patient symptomatically improved with lasix, BP control.  Would repeat CBC as outpatient. - Guafenasin -Agree with checking U/A    Diabetes mellitus type 2, uncontrolled ([place]) - Well controlled    Essential hypertension - Improved, continue home medications   Chief Complaint(s):SOB  History - key components related to admission: Briefly Katherine [name] is an 82 year old female with a history of heart failure with preserved ejection fraction, aortic stenosis status post TAVR, coronary artery disease status post CABG, hypertension, hyperlipidemia, type 2 diabetes, dementia who presented yesterday with 1 day of shortness of breath. This shortness of breath was associated with a mildly productive cough and was initially treated at home with DuoNeb's. However when the patient went to bed and woke later in the night she become progressively more short of breath and EMS was called. EMS arrival she was noted to be hypoxic and apparently placed on CPAP, she was also noted to be very hypertensive with systolic blood pressure of 240 by the time of arrival to the emergency department blood pressures as well as hypoxia had improved. Chest x-ray was  obtained which was suggestive of some mild pulmonary edema and she was admitted to the internal medicine teaching service for further diuresis and management. Today on exam she reports that she is feeling very well, she is still having a mild cough but otherwise is greatly improved.  Lab results: Reviewed in Epic  Physical Exam - key components related to admission: General: resting in bed in NAD HEENT: EOMI, no scleral icterus Cardiac: RRR, no rubs, murmurs or gallops Pulm: clear to auscultation bilaterally, moving normal volumes of air Abd: soft, nontender, nondistended, BS present Ext: warm and well perfused, 1+ pedal edema   Vitals:   11/20/16 1300 11/20/16 1959 11/20/16 2359 11/21/16 0444  BP: (!) 152/56 (!) 167/52 (!) 178/63 (!) 168/55  Pulse: 77 78 86 76  Resp: (!) 22 18 20 18   Temp: 98.3 F (36.8 C) 99.4 F (37.4 C) 99.6 F (37.6 C) 98.3 F (36.8 C)  TempSrc: Oral Oral Oral Oral  SpO2: 100% 98% 97% 99%  Weight:    208 lb 6.4 oz (94.5 kg)  Height: 5\' 2"  (1.575 m)

## 2016-11-21 NOTE — Progress Notes (Signed)
Paged MD Strelow about pt UA. MD gave verbal orders to straight cath for obtainment and allow time for MD to review orders before discharging. Asked for clarification on lasix dose. MD stated he will move PO lasix 40mg  due to pt receiving IV lasix this morning, see MAR  Jerrard Bradburn Leory Plowman

## 2016-11-22 ENCOUNTER — Inpatient Hospital Stay (HOSPITAL_COMMUNITY)
Admission: EM | Admit: 2016-11-22 | Discharge: 2016-11-24 | DRG: 202 | Disposition: A | Discharge status: Home Health | Payer: Medicare Other | Attending: Internal Medicine | Admitting: Internal Medicine

## 2016-11-22 ENCOUNTER — Emergency Department (HOSPITAL_COMMUNITY): Payer: Medicare Other

## 2016-11-22 ENCOUNTER — Encounter (HOSPITAL_COMMUNITY): Payer: Self-pay

## 2016-11-22 DIAGNOSIS — N179 Acute kidney failure, unspecified: Secondary | ICD-10-CM | POA: Diagnosis not present

## 2016-11-22 DIAGNOSIS — R05 Cough: Secondary | ICD-10-CM | POA: Diagnosis present

## 2016-11-22 DIAGNOSIS — R059 Cough, unspecified: Secondary | ICD-10-CM | POA: Diagnosis present

## 2016-11-22 DIAGNOSIS — Z794 Long term (current) use of insulin: Secondary | ICD-10-CM | POA: Diagnosis not present

## 2016-11-22 DIAGNOSIS — J209 Acute bronchitis, unspecified: Principal | ICD-10-CM | POA: Diagnosis present

## 2016-11-22 DIAGNOSIS — Z8719 Personal history of other diseases of the digestive system: Secondary | ICD-10-CM

## 2016-11-22 DIAGNOSIS — G9340 Encephalopathy, unspecified: Secondary | ICD-10-CM

## 2016-11-22 DIAGNOSIS — E785 Hyperlipidemia, unspecified: Secondary | ICD-10-CM | POA: Diagnosis present

## 2016-11-22 DIAGNOSIS — F0390 Unspecified dementia without behavioral disturbance: Secondary | ICD-10-CM | POA: Diagnosis not present

## 2016-11-22 DIAGNOSIS — M6281 Muscle weakness (generalized): Secondary | ICD-10-CM

## 2016-11-22 DIAGNOSIS — I959 Hypotension, unspecified: Secondary | ICD-10-CM | POA: Diagnosis present

## 2016-11-22 DIAGNOSIS — E860 Dehydration: Secondary | ICD-10-CM | POA: Diagnosis present

## 2016-11-22 DIAGNOSIS — Z8249 Family history of ischemic heart disease and other diseases of the circulatory system: Secondary | ICD-10-CM

## 2016-11-22 DIAGNOSIS — I110 Hypertensive heart disease with heart failure: Secondary | ICD-10-CM | POA: Diagnosis present

## 2016-11-22 DIAGNOSIS — F419 Anxiety disorder, unspecified: Secondary | ICD-10-CM | POA: Diagnosis present

## 2016-11-22 DIAGNOSIS — Z952 Presence of prosthetic heart valve: Secondary | ICD-10-CM | POA: Diagnosis not present

## 2016-11-22 DIAGNOSIS — I5032 Chronic diastolic (congestive) heart failure: Secondary | ICD-10-CM | POA: Diagnosis present

## 2016-11-22 DIAGNOSIS — Z7902 Long term (current) use of antithrombotics/antiplatelets: Secondary | ICD-10-CM

## 2016-11-22 DIAGNOSIS — N390 Urinary tract infection, site not specified: Secondary | ICD-10-CM | POA: Diagnosis not present

## 2016-11-22 DIAGNOSIS — R1310 Dysphagia, unspecified: Secondary | ICD-10-CM | POA: Diagnosis not present

## 2016-11-22 DIAGNOSIS — R0681 Apnea, not elsewhere classified: Secondary | ICD-10-CM | POA: Diagnosis not present

## 2016-11-22 DIAGNOSIS — I2510 Atherosclerotic heart disease of native coronary artery without angina pectoris: Secondary | ICD-10-CM | POA: Diagnosis not present

## 2016-11-22 DIAGNOSIS — Z823 Family history of stroke: Secondary | ICD-10-CM

## 2016-11-22 DIAGNOSIS — Z951 Presence of aortocoronary bypass graft: Secondary | ICD-10-CM

## 2016-11-22 DIAGNOSIS — E876 Hypokalemia: Secondary | ICD-10-CM | POA: Diagnosis not present

## 2016-11-22 DIAGNOSIS — R0602 Shortness of breath: Secondary | ICD-10-CM | POA: Diagnosis not present

## 2016-11-22 DIAGNOSIS — K219 Gastro-esophageal reflux disease without esophagitis: Secondary | ICD-10-CM | POA: Diagnosis not present

## 2016-11-22 DIAGNOSIS — Z79899 Other long term (current) drug therapy: Secondary | ICD-10-CM

## 2016-11-22 DIAGNOSIS — R531 Weakness: Secondary | ICD-10-CM | POA: Diagnosis not present

## 2016-11-22 DIAGNOSIS — Z66 Do not resuscitate: Secondary | ICD-10-CM | POA: Diagnosis present

## 2016-11-22 DIAGNOSIS — R404 Transient alteration of awareness: Secondary | ICD-10-CM | POA: Diagnosis not present

## 2016-11-22 DIAGNOSIS — Z8711 Personal history of peptic ulcer disease: Secondary | ICD-10-CM

## 2016-11-22 DIAGNOSIS — E119 Type 2 diabetes mellitus without complications: Secondary | ICD-10-CM | POA: Diagnosis not present

## 2016-11-22 DIAGNOSIS — Z833 Family history of diabetes mellitus: Secondary | ICD-10-CM

## 2016-11-22 LAB — COMPREHENSIVE METABOLIC PANEL
ALBUMIN: 3.4 g/dL — AB (ref 3.5–5.0)
ALK PHOS: 73 U/L (ref 38–126)
ALT: 18 U/L (ref 14–54)
ANION GAP: 10 (ref 5–15)
AST: 25 U/L (ref 15–41)
BILIRUBIN TOTAL: 0.4 mg/dL (ref 0.3–1.2)
BUN: 28 mg/dL — AB (ref 6–20)
CALCIUM: 10.4 mg/dL — AB (ref 8.9–10.3)
CO2: 30 mmol/L (ref 22–32)
Chloride: 97 mmol/L — ABNORMAL LOW (ref 101–111)
Creatinine, Ser: 1.53 mg/dL — ABNORMAL HIGH (ref 0.44–1.00)
GFR calc Af Amer: 34 mL/min — ABNORMAL LOW (ref >60)
GFR calc non Af Amer: 30 mL/min — ABNORMAL LOW (ref >60)
GLUCOSE: 138 mg/dL — AB (ref 65–99)
Potassium: 3.7 mmol/L (ref 3.5–5.1)
SODIUM: 137 mmol/L (ref 135–145)
Total Protein: 7.5 g/dL (ref 6.5–8.1)

## 2016-11-22 LAB — CBC WITH DIFFERENTIAL/PLATELET
Basophils Absolute: 0 10*3/uL (ref 0.0–0.1)
Basophils Relative: 0 %
EOS ABS: 0 10*3/uL (ref 0.0–0.7)
Eosinophils Relative: 0 %
HEMATOCRIT: 35.3 % — AB (ref 36.0–46.0)
HEMOGLOBIN: 12.4 g/dL (ref 12.0–15.0)
LYMPHS ABS: 1.6 10*3/uL (ref 0.7–4.0)
LYMPHS PCT: 11 %
MCH: 27.8 pg (ref 26.0–34.0)
MCHC: 35.1 g/dL (ref 30.0–36.0)
MCV: 79.1 fL (ref 78.0–100.0)
MONOS PCT: 12 %
Monocytes Absolute: 1.8 10*3/uL — ABNORMAL HIGH (ref 0.1–1.0)
NEUTROS ABS: 11.1 10*3/uL — AB (ref 1.7–7.7)
NEUTROS PCT: 77 %
Platelets: 279 10*3/uL (ref 150–400)
RBC: 4.46 MIL/uL (ref 3.87–5.11)
RDW: 13.8 % (ref 11.5–15.5)
WBC: 14.5 10*3/uL — AB (ref 4.0–10.5)

## 2016-11-22 LAB — URINALYSIS, ROUTINE W REFLEX MICROSCOPIC
Bilirubin Urine: NEGATIVE
Glucose, UA: NEGATIVE mg/dL
HGB URINE DIPSTICK: NEGATIVE
KETONES UR: NEGATIVE mg/dL
Nitrite: NEGATIVE
PH: 5 (ref 5.0–8.0)
Protein, ur: 100 mg/dL — AB
SPECIFIC GRAVITY, URINE: 1.019 (ref 1.005–1.030)

## 2016-11-22 LAB — I-STAT VENOUS BLOOD GAS, ED
ACID-BASE EXCESS: 8 mmol/L — AB (ref 0.0–2.0)
BICARBONATE: 32.4 mmol/L — AB (ref 20.0–28.0)
O2 SAT: 74 %
TCO2: 34 mmol/L (ref 0–100)
pCO2, Ven: 45.4 mmHg (ref 44.0–60.0)
pH, Ven: 7.461 — ABNORMAL HIGH (ref 7.250–7.430)
pO2, Ven: 38 mmHg (ref 32.0–45.0)

## 2016-11-22 LAB — BRAIN NATRIURETIC PEPTIDE: B NATRIURETIC PEPTIDE 5: 137.5 pg/mL — AB (ref 0.0–100.0)

## 2016-11-22 LAB — I-STAT TROPONIN, ED: Troponin i, poc: 0.01 ng/mL (ref 0.00–0.08)

## 2016-11-22 MED ORDER — DEXTROSE 5 % IV SOLN: 1.0000 g | Freq: Once | INTRAVENOUS | Status: DC

## 2016-11-22 NOTE — ED Provider Notes (Signed)
[facility] DEPT Provider Note   CSN: [id number] Arrival date & time: 11/22/16  1648     History   Chief Complaint Chief Complaint  Patient presents with  . Weakness    HPI Katherine [name] is a 82 y.o. female.  The history is provided by a relative.  Altered Mental Status   This is a new problem. The current episode started 1 to 2 hours ago. The problem has been gradually improving. Associated symptoms include somnolence (with poor po intake at home since discharge yesterday), unresponsiveness (with apnea and color change of skin) and weakness. Past medical history comments: CHF.  Cough  This is a recurrent problem. The current episode started more than 2 days ago. The problem occurs constantly. The problem has not changed since onset.The cough is non-productive. There has been no fever. Associated symptoms include shortness of breath. Past medical history comments: CHF.    Past Medical History:  Diagnosis Date  . Anemia   . Anxiety   . Aortic valvular stenosis    moderate to severe  EF on 2D Echo 10/22/2012 65-70%  valve area of 1.2cm, and peak and mean gradients of 45 and 36mmHg  . Chalazion of right upper eyelid early June 2016   treating with cream, Rx by Dr. Merlyn Albert    . CHF (congestive heart failure) ([place])   . Coronary artery disease   . Dementia   . Diabetes mellitus, type 2 ([place])   . Dyspnea    chronic  . Easy fatigability   . Gastric mass   . Gastric ulcer   . GERD (gastroesophageal reflux disease)   . Heart murmur   . Hernia of unspecified site of abdominal cavity without mention of obstruction or gangrene    hiatal  . Hyperlipidemia   . Hypertension   . Normal cardiac stress test 12/2013   normal stress nuclear study  . Osteoarthritis   . Thyroid mass     Patient Active Problem List   Diagnosis Date Noted  . Acute on chronic diastolic congestive heart failure ([place])   . Acute exacerbation of CHF (congestive heart failure) ([place]) 11/20/2016    . Bronchospasm 12/16/2015  . Acute bronchitis 12/16/2015  . S/P TAVR (transcatheter aortic valve replacement) 09/02/2015  . Constipation 06/04/2015  . Postoperative pulmonary edema ([place]) 06/04/2015  . Severe aortic stenosis   . SOB (shortness of breath)   . Severe aortic valve stenosis 05/26/2015  . Bilateral leg edema 10/17/2014  . Acute diastolic congestive heart failure, NYHA class 3 ([place]) 10/17/2014  . CHF with left ventricular diastolic dysfunction, NYHA class 2 ([place]) 09/09/2014  . Chest pain 12/04/2013  . Difficulty walking 10/28/2013  . Muscle weakness (generalized) 10/28/2013  . Morbid obesity ([place]) 10/24/2013  . Moderate to severe aortic stenosis 04/22/2013  . S/P CABG x 4 04/22/2013  . Gastric mass 08/18/2011  . Diabetes mellitus, type 2 ([place])   . CHF 03/04/2010  . COLONIC POLYPS, HX OF 03/04/2010  . THYROID MASS 02/25/2010  . Diabetes mellitus type 2, uncontrolled ([place]) 02/25/2010  . Hyperlipidemia 02/25/2010  . UNSPECIFIED ANEMIA 02/25/2010  . Anxiety state 02/25/2010  . Essential hypertension 02/25/2010  . Coronary atherosclerosis 02/25/2010  . REFLUX ESOPHAGITIS 02/25/2010  . HIATAL HERNIA 02/25/2010  . OSTEOARTHRITIS 02/25/2010  . History of peptic ulcer disease 02/25/2010    Past Surgical History:  Procedure Laterality Date  . AORTIC VALVE SURGERY    . CATARACT EXTRACTION Bilateral   . CORONARY ARTERY BYPASS GRAFT  x4 in 2003  . internal hemorrhoidectomy    . KNEE SURGERY Right   . LEFT AND RIGHT HEART CATHETERIZATION WITH CORONARY/GRAFT ANGIOGRAM N/A 03/24/2015   Procedure: LEFT AND RIGHT HEART CATHETERIZATION WITH Beatrix Fetters;  Surgeon: Burnell Blanks, MD;  Location: [facility] CATH LAB;  Service: Cardiovascular;  Laterality: N/A;  . TEE WITHOUT CARDIOVERSION N/A 05/26/2015   Procedure: TRANSESOPHAGEAL ECHOCARDIOGRAM (TEE);  Surgeon: Burnell Blanks, MD;  Location: [facility];  Service: Open Heart Surgery;  Laterality: N/A;  .  TRANSCATHETER AORTIC VALVE REPLACEMENT, TRANSFEMORAL N/A 05/26/2015   Procedure: TRANSCATHETER AORTIC VALVE REPLACEMENT, TRANSFEMORAL;  Surgeon: Burnell Blanks, MD;  Location: [facility];  Service: Open Heart Surgery;  Laterality: N/A;  . TUBAL LIGATION    . uterine polypectomy      OB History    No data available       Home Medications    Prior to Admission medications   Medication Sig Start Date End Date Taking? Authorizing Provider  acetaminophen (TYLENOL) 500 MG tablet Take two tablets by mouth twice daily for pain. Do not exceed 4gm of Tylenol in 24 hours    Historical Provider, MD  ALPRAZolam (XANAX) 0.25 MG tablet Take 0.5 tablets (0.125 mg total) by mouth 2 (two) times daily as needed for anxiety or sleep. Patient taking differently: Take 0.25 mg by mouth 3 (three) times daily.  06/03/15   Rhonda G Barrett, PA-C  atorvastatin (LIPITOR) 80 MG tablet Take 80 mg by mouth daily.     Historical Provider, MD  clopidogrel (PLAVIX) 75 MG tablet Take 1 tablet (75 mg total) by mouth daily with breakfast. 06/03/15   Evelene Croon Barrett, PA-C  fexofenadine (ALLEGRA) 180 MG tablet Take 180 mg by mouth daily.    Historical Provider, MD  furosemide (LASIX) 20 MG tablet Take 2 tablets (40 mg total) by mouth daily. <PLEASE MAKE APPOINTMENT FOR REFILLS> Patient taking differently: Take 40 mg by mouth daily.  07/27/16   [name], MD  guaiFENesin (MUCINEX) 600 MG 12 hr tablet Take 1 tablet (600 mg total) by mouth 2 (two) times daily. Patient taking differently: Take 600 mg by mouth 2 (two) times daily as needed. For acute pulmonary edema. Do not crush. 06/03/15   Evelene Croon Barrett, PA-C  hydrALAZINE (APRESOLINE) 50 MG tablet Take 1 tablet (50 mg total) by mouth every 8 (eight) hours. Patient taking differently: Take 50 mg by mouth every 8 (eight) hours. For blood pressure 06/03/15   Rhonda G Barrett, PA-C  insulin detemir (LEVEMIR) 100 UNIT/ML injection Inject 0.3 mLs (30 Units total) into the skin 2  (two) times daily. Patient taking differently: Inject 30 Units into the skin 2 (two) times daily with a meal. DEPENDING ON BGL 06/03/15   Rhonda G Barrett, PA-C  Ipratropium-Albuterol (COMBIVENT RESPIMAT) 20-100 MCG/ACT AERS respimat Inhale 2 puffs into the lungs every 6 (six) hours as needed for wheezing or shortness of breath. 10/17/14   [name], MD  lactulose (CHRONULAC) 10 GM/15ML solution Take 45 mLs (30 g total) by mouth daily as needed for mild constipation or severe constipation. Patient not taking: Reported on 11/20/2016 06/03/15   Evelene Croon Barrett, PA-C  Linaclotide (LINZESS) 145 MCG CAPS capsule Take 145 mcg by mouth daily as needed (for constipation).    Historical Provider, MD  lisinopril (PRINIVIL,ZESTRIL) 10 MG tablet Take 1 tablet (10 mg total) by mouth daily. 06/03/15   Evelene Croon Barrett, PA-C  loperamide (IMODIUM) 2 MG capsule Take 2  mg by mouth as needed for diarrhea or loose stools.    Historical Provider, MD  meloxicam (MOBIC) 7.5 MG tablet Take 7.5 mg by mouth daily as needed for pain.  11/18/16   Historical Provider, MD  Memantine HCl ER (NAMENDA XR) 28 MG CP24 Take one capsule by mouth every morning for dementia    Historical Provider, MD  metFORMIN (GLUCOPHAGE-XR) 500 MG 24 hr tablet Take 500 mg by mouth daily with breakfast. To control blood sugar. Do not crush. 03/16/15   Historical Provider, MD  nebivolol (BYSTOLIC) 10 MG tablet Take one tablet by mouth every morning for blood pressure    Historical Provider, MD  NEXIUM 20 MG capsule Take one capsule by mouth once daily for reflux 04/25/14   Historical Provider, MD  NYSTATIN powder Apply 1 application topically 2 (two) times daily as needed for for breast care. 11/15/16   Historical Provider, MD  polyethylene glycol (MIRALAX / GLYCOLAX) packet Take 17 g by mouth daily. Patient not taking: Reported on 11/20/2016 06/03/15   Evelene Croon Barrett, PA-C  potassium chloride SA (K-DUR,KLOR-CON) 20 MEQ tablet Take 20 mEq by mouth  daily.    Historical Provider, MD  traMADol (ULTRAM) 50 MG tablet Take 1-2 tablets (50-100 mg total) by mouth every 4 (four) hours as needed for moderate pain. Patient not taking: Reported on 11/20/2016 06/03/15   Evelene Croon Barrett, PA-C  VOLTAREN 1 % GEL Apply 2 g topically daily as needed (pain).  03/25/13   Historical Provider, MD    Family History Family History  Problem Relation Age of Onset  . Diabetes Mother   . Heart disease Mother   . Stroke Mother   . Cerebral aneurysm Mother   . Stroke Father   . Heart attack Brother   . Colon cancer Neg Hx     Social History Social History  Substance Use Topics  . Smoking status: Never Smoker  . Smokeless tobacco: Never Used  . Alcohol use No     Allergies   Tape   Review of Systems Review of Systems  Respiratory: Positive for cough and shortness of breath.   Neurological: Positive for weakness.  All other systems reviewed and are negative.    Physical Exam Updated Vital Signs BP (!) 113/51   Pulse 67   Temp 98.4 F (36.9 C) (Oral)   Resp 26   Ht 5\' 2"  (1.575 m)   Wt 208 lb (94.3 kg)   SpO2 94%   BMI 38.04 kg/m   Physical Exam  Constitutional: She appears well-developed and well-nourished. She appears lethargic. No distress.  HENT:  Head: Normocephalic.  Nose: Nose normal.  Eyes: Conjunctivae are normal.  Neck: Neck supple. No tracheal deviation present.  Cardiovascular: Normal rate and regular rhythm.   Pulmonary/Chest: Effort normal. Tachypnea noted. No respiratory distress. She has no wheezes. She has no rhonchi.  Abdominal: Soft. She exhibits no distension.  Neurological: She appears lethargic. She is disoriented.  Initially difficult to arouse with open-mouth breathing  Skin: Skin is warm and dry.  Psychiatric: She has a normal mood and affect.     ED Treatments / Results  Labs (all labs ordered are listed, but only abnormal results are displayed) Labs Reviewed  CBC WITH DIFFERENTIAL/PLATELET -  Abnormal; Notable for the following:       Result Value   WBC 14.5 (*)    HCT 35.3 (*)    Neutro Abs 11.1 (*)    Monocytes Absolute 1.8 (*)  All other components within normal limits  COMPREHENSIVE METABOLIC PANEL - Abnormal; Notable for the following:    Chloride 97 (*)    Glucose, Bld 138 (*)    BUN 28 (*)    Creatinine, Ser 1.53 (*)    Calcium 10.4 (*)    Albumin 3.4 (*)    GFR calc non Af Amer 30 (*)    GFR calc Af Amer 34 (*)    All other components within normal limits  BRAIN NATRIURETIC PEPTIDE - Abnormal; Notable for the following:    B Natriuretic Peptide 137.5 (*)    All other components within normal limits  URINALYSIS, ROUTINE W REFLEX MICROSCOPIC - Abnormal; Notable for the following:    APPearance HAZY (*)    Protein, ur 100 (*)    Leukocytes, UA MODERATE (*)    Bacteria, UA MANY (*)    Squamous Epithelial / LPF 0-5 (*)    All other components within normal limits  URINE CULTURE  I-STAT TROPOININ, ED  I-STAT VENOUS BLOOD GAS, ED    EKG  EKG Interpretation  Date/Time:  Tuesday November 22 2016 16:56:34 EST Ventricular Rate:  73 PR Interval:    QRS Duration: 101 QT Interval:  390 QTC Calculation: 430 R Axis:   7 Text Interpretation:  Sinus rhythm Abnormal R-wave progression, early transition Left ventricular hypertrophy Since last tracing rate slower Otherwise no significant change Confirmed by Ayaka Andes MD, Quillian Quince ([id number]) on 11/22/2016 5:01:26 PM       Radiology Dg Chest 2 View  Result Date: 11/22/2016 CLINICAL DATA:  82 year old female with shortness of Breath. Initial encounter. EXAM: CHEST  2 VIEW COMPARISON:  11/20/2016 and earlier. FINDINGS: Upright AP and lateral views of the chest. Chronic cardiomegaly and sequelae of CABG. Sequelae of cardiac valve replacement. Extensive Calcified aortic atherosclerosis. Stable cardiac size and mediastinal contours. No pneumothorax or pulmonary edema. No pleural effusion or consolidation. Osteopenia. No acute  osseous abnormality identified. IMPRESSION: No acute cardiopulmonary abnormality. Calcified aortic atherosclerosis.  Chronic cardiomegaly. Electronically Signed   By: Genevie Ann M.D.   On: 11/22/2016 18:44    Procedures Procedures (including critical care time)  Medications Ordered in ED Medications - No data to display   Initial Impression / Assessment and Plan / ED Course  I have reviewed the triage vital signs and the nursing notes.  Pertinent labs & imaging results that were available during my care of the patient were reviewed by me and considered in my medical decision making (see chart for details).  Clinical Course     82 year old female presents with an episode from home where she became unresponsive and apneic with her family and they state that her lips began to turn blue. She has had worsening lethargy since being discharged from the hospital yesterday and has had poor by mouth intake and has not taken fluids well. She admitted previously with CHF exacerbation and bronchitis.  Due to this apparent apnea episode the family has increased concern that the patient may not be safe at home. She had no apnea here but does have ongoing tachypnea and intermittent cough. She is very lethargic. Heavy pyuria and bacteriuria is concerning for developing urinary tract infection in the setting of encephalopathy at home. Admitted to internal medicine teaching service who discharged her yesterday for continued monitoring and treatment.  Final Clinical Impressions(s) / ED Diagnoses   Final diagnoses:  Apnea spell  Shortness of breath  Encephalopathy  Urinary tract infection without hematuria, site unspecified  New Prescriptions New Prescriptions   No medications on file     Leo Grosser, MD 11/22/16 2330

## 2016-11-22 NOTE — H&P (Signed)
Date: 11/22/2016               Patient Name:  Katherine [name] MRN: [id number]  DOB: 04/01/1928 Age / Sex: 82 y.o., female   PCP: Celene Squibb, MD         Medical Service: Internal Medicine Teaching Service         Attending Physician: Dr. Leo Grosser, MD    First Contact: Dr. Gay Filler  Pager: [phone]  Second Contact: Dr. Marlowe Sax  Pager: [phone]       After Hours (After 5p/  First Contact Pager: [phone]  weekends / holidays): Second Contact Pager: [phone]   Chief Complaint: Lethargy   History of Present Illness: [name] is a 82 y.o. female with a PMH of ID-T2DM, dementia, HTN, chronic HFpEF, CAD s/p 4v CABG on anticoaculation, s/p biprosthetic TAVR 2016 who was hospitalized 12/17-12/18 for shortness of breath and cough,  suspected flash pulmonary edema secondary to missed doses of antihypertensive medications. Her daughter states that her cough was greatly improved yesterday at the time of discharge, her cough had improved from what they could tell and she was back to baseline. At baseline she walks herself to the bathroom and is conversational. Today they noticed that she was less responsive to their attempts to make conversation. They also believe that she didn't urinate for 14 hours but then was believed to have urinated with EMS. Also was having difficulty swallowing and was able to take all of her medication with coaching but has not been eating or drinking much today. In the evening she had an episode of coughing which lead to uneasy breathing and "her face turned blue" that lasted 5 minutes. She was so tired at the time that her daughter was unable to convince her to use the albuterol inhaler. This prompted her to call EMS.  The patient states that she is feeling fine besides the coughing. She denies fever, chills, shortness of breath, weakness, chest pain or tightness, decreased appetite, nausea, dysuria, or frequency.   Portions of this history were taken  from the pt daughter and granddaughter as the patient is tired and disoriented.   In the ED she was afebrile, HR 76, BP 134/57, SpO2 98% on room air. Labs revealed crt 1.5, BNP 137.5, WBC 14.5, urinalysis was not a clean catch, negative nitrities, many bacteria, and moderate leukocytes. Was admitted for monitoring and treatment.   Meds:  Current Meds  Medication Sig  . acetaminophen (TYLENOL) 500 MG tablet Take two tablets by mouth twice daily for pain. Do not exceed 4gm of Tylenol in 24 hours  . ALPRAZolam (XANAX) 0.25 MG tablet Take 0.5 tablets (0.125 mg total) by mouth 2 (two) times daily as needed for anxiety or sleep. (Patient taking differently: Take 0.25 mg by mouth 3 (three) times daily. )  . atorvastatin (LIPITOR) 80 MG tablet Take 80 mg by mouth daily.   . clopidogrel (PLAVIX) 75 MG tablet Take 1 tablet (75 mg total) by mouth daily with breakfast.  . fexofenadine (ALLEGRA) 180 MG tablet Take 180 mg by mouth daily.  . furosemide (LASIX) 20 MG tablet Take 2 tablets (40 mg total) by mouth daily. <PLEASE MAKE APPOINTMENT FOR REFILLS> (Patient taking differently: Take 40 mg by mouth daily. )  . guaiFENesin (MUCINEX) 600 MG 12 hr tablet Take 1 tablet (600 mg total) by mouth 2 (two) times daily. (Patient taking differently: Take 600 mg by mouth 2 (two) times daily as needed.  For acute pulmonary edema. Do not crush.)  . hydrALAZINE (APRESOLINE) 50 MG tablet Take 1 tablet (50 mg total) by mouth every 8 (eight) hours. (Patient taking differently: Take 50 mg by mouth every 8 (eight) hours. For blood pressure)  . insulin detemir (LEVEMIR) 100 UNIT/ML injection Inject 0.3 mLs (30 Units total) into the skin 2 (two) times daily. (Patient taking differently: Inject 30 Units into the skin 2 (two) times daily with a meal. DEPENDING ON BGL)  . Ipratropium-Albuterol (COMBIVENT RESPIMAT) 20-100 MCG/ACT AERS respimat Inhale 2 puffs into the lungs every 6 (six) hours as needed for wheezing or shortness of breath.   . lactulose (CHRONULAC) 10 GM/15ML solution Take 45 mLs (30 g total) by mouth daily as needed for mild constipation or severe constipation.  . Linaclotide (LINZESS) 145 MCG CAPS capsule Take 145 mcg by mouth daily as needed (for constipation).  Marland Kitchen lisinopril (PRINIVIL,ZESTRIL) 10 MG tablet Take 1 tablet (10 mg total) by mouth daily.  Marland Kitchen loperamide (IMODIUM) 2 MG capsule Take 2 mg by mouth as needed for diarrhea or loose stools.  . meloxicam (MOBIC) 7.5 MG tablet Take 7.5 mg by mouth daily as needed for pain.   . Memantine HCl ER (NAMENDA XR) 28 MG CP24 Take one capsule by mouth every morning for dementia  . metFORMIN (GLUCOPHAGE-XR) 500 MG 24 hr tablet Take 500 mg by mouth daily with breakfast. To control blood sugar. Do not crush.  . nebivolol (BYSTOLIC) 10 MG tablet Take one tablet by mouth every morning for blood pressure  . NEXIUM 20 MG capsule Take one capsule by mouth once daily for reflux  . NYSTATIN powder Apply 1 application topically 2 (two) times daily as needed for for breast care.  . polyethylene glycol (MIRALAX / GLYCOLAX) packet Take 17 g by mouth daily.  . potassium chloride SA (K-DUR,KLOR-CON) 20 MEQ tablet Take 20 mEq by mouth daily.  . traMADol (ULTRAM) 50 MG tablet Take 1-2 tablets (50-100 mg total) by mouth every 4 (four) hours as needed for moderate pain.  Marland Kitchen VOLTAREN 1 % GEL Apply 2 g topically daily as needed (pain).    Allergies: Allergies as of 11/22/2016 - Review Complete 11/22/2016  Allergen Reaction Noted  . Tape Other (See Comments) 11/20/2016   Past Medical History:  Diagnosis Date  . Anemia   . Anxiety   . Aortic valvular stenosis    moderate to severe  EF on 2D Echo 10/22/2012 65-70%  valve area of 1.2cm, and peak and mean gradients of 45 and 59mmHg  . Chalazion of right upper eyelid early June 2016   treating with cream, Rx by Dr. Merlyn Albert    . CHF (congestive heart failure) ([place])   . Coronary artery disease   . Dementia   . Diabetes mellitus, type 2  ([place])   . Dyspnea    chronic  . Easy fatigability   . Gastric mass   . Gastric ulcer   . GERD (gastroesophageal reflux disease)   . Heart murmur   . Hernia of unspecified site of abdominal cavity without mention of obstruction or gangrene    hiatal  . Hyperlipidemia   . Hypertension   . Normal cardiac stress test 12/2013   normal stress nuclear study  . Osteoarthritis   . Thyroid mass    Family History:  Family History  Problem Relation Age of Onset  . Diabetes Mother   . Heart disease Mother   . Stroke Mother   . Cerebral aneurysm  Mother   . Stroke Father   . Heart attack Brother   . Colon cancer Neg Hx     Social History:  Never smoked, drank, or used illicit drugs.   Review of Systems: A complete ROS was negative except as per HPI.   Physical Exam: Blood pressure (!) 103/46, pulse 64, temperature 98.4 F (36.9 C), temperature source Oral, resp. rate 16, height 5\' 2"  (1.575 m), weight 208 lb (94.3 kg), SpO2 95 %. Physical Exam  Constitutional: She is oriented to person, place, and time. She appears well-developed and well-nourished. No distress.  Somnolent elder woman lying comfortably in bed falls asleep at multiple points during this conversation  HENT:  Head: Normocephalic and atraumatic.  Eyes: Conjunctivae are normal. Pupils are equal, round, and reactive to light. No scleral icterus.  Possible left visual field neglect limited by attention to testing   Cardiovascular: Normal rate, regular rhythm and intact distal pulses.   Loud second heart sound  Bilateral 1+ lower extremity pitting edema   Pulmonary/Chest: Effort normal. No respiratory distress. She has wheezes.  Breathing comfortably, speaking in full sentences, not on oxygen, not tachypneic, expiratory wheeze, right basilar crackles   Abdominal: Soft. Bowel sounds are normal. She exhibits no distension. There is no tenderness. There is no guarding.  Neurological: She is alert and oriented to person,  place, and time. No cranial nerve deficit.  Lower extremity bilateral proximal muscle weakness  Skin: Skin is warm and dry. She is not diaphoretic. No pallor.  Psychiatric: She has a normal mood and affect. Her behavior is normal.   EKG: normal sinus rhythm, LVH, abnormal R- wave progression  EYC:XKGYJEHU review of the chest xray reveals no acute cardiopulmonary disease, edema seems improved from prior. Sternotomy wires, CABG, valve replacement.   CT Head : Personal review of the image reveals periventricular hypodensity and cortical atrophy, no acute intercranial abnormalities.   Assessment & Plan by Problem: Active Problems:   * No active hospital problems. *  Difficulty swallowing  New onset difficulty swallowing and somnolence were concerning for stroke. CT head ruled out acute intracranial abnormality. Difficulty swallowing may be related to somnolence.  Ordered bedside swallow evaluation  Ordered 500 cc NS bolus   Leukocytosis  WBC 14.5, is trending up from 13.2 on prior admission. She is afebrile and denies dysuria or frequency, urinalysis was not a clean catch and negative for nitrites. She continues to have cough despite CXR demonstrating improvement in edema. No obvious signs of PNA on CXR but may be falsely negative in the setting of dehydration.  Follow urine cultures  AKI  Crt 1.53 with baseline 0.8. Could be cardiorenal given her heart failure or dehydration given her dysphagia today. Will hold lisinopril and lasix.  Follow up morning BMET   Chronic diastolic CHF  Echo 02/1496 EF 60-65% with elevated LVEDP. Does not appear grossly vol overloaded on exam.  Holding home medication lasix 40 mg qd in the setting of AKI   Essential HTN  Currently hypotensive.  Ordered home meds Hydralazine 50 mg TID, nebivolol 10 mg qd.  Holding home med lisinopril 10 mg qd in the setting of AKI   ID T2DM HbA1c 12/2015 6.7. Home meds Levemir 30 U BID.  Ordered lantus 15 units qHS    Ordered CBG and ISS  Follow up HbA1c   CAD s/p 4 v CABG 2003  Continued home medications plavix 75 mg qd, atorvastatin 80mg  qd  Dementia  Continued home medication Memantine  DVT Ppx subq heparin  Code Status DNR   Dispo: Admit patient to Observation with expected length of stay less than 2 midnights.  Signed: Ledell Noss, MD 11/22/2016, 11:52 PM  Pager: [phone]

## 2016-11-22 NOTE — ED Triage Notes (Signed)
Pt arrived via [facility], from home. pt has no c/o, here for weakness but pt denies any weakness, SOB, pain.

## 2016-11-22 NOTE — ED Triage Notes (Signed)
Pt states she was d/c from [place] yesterday

## 2016-11-22 NOTE — ED Notes (Signed)
EDP at bedside  

## 2016-11-22 NOTE — ED Notes (Signed)
Patient transported to CT 

## 2016-11-23 ENCOUNTER — Encounter (HOSPITAL_COMMUNITY): Payer: Self-pay | Admitting: Rehabilitation

## 2016-11-23 DIAGNOSIS — Z79899 Other long term (current) drug therapy: Secondary | ICD-10-CM | POA: Diagnosis not present

## 2016-11-23 DIAGNOSIS — E860 Dehydration: Secondary | ICD-10-CM | POA: Diagnosis present

## 2016-11-23 DIAGNOSIS — N390 Urinary tract infection, site not specified: Secondary | ICD-10-CM | POA: Diagnosis present

## 2016-11-23 DIAGNOSIS — Z800 Family history of malignant neoplasm of digestive organs: Secondary | ICD-10-CM

## 2016-11-23 DIAGNOSIS — J209 Acute bronchitis, unspecified: Secondary | ICD-10-CM | POA: Diagnosis not present

## 2016-11-23 DIAGNOSIS — B9689 Other specified bacterial agents as the cause of diseases classified elsewhere: Secondary | ICD-10-CM | POA: Diagnosis not present

## 2016-11-23 DIAGNOSIS — Z952 Presence of prosthetic heart valve: Secondary | ICD-10-CM

## 2016-11-23 DIAGNOSIS — Z8719 Personal history of other diseases of the digestive system: Secondary | ICD-10-CM | POA: Diagnosis not present

## 2016-11-23 DIAGNOSIS — R059 Cough, unspecified: Secondary | ICD-10-CM | POA: Diagnosis present

## 2016-11-23 DIAGNOSIS — E119 Type 2 diabetes mellitus without complications: Secondary | ICD-10-CM

## 2016-11-23 DIAGNOSIS — Z7902 Long term (current) use of antithrombotics/antiplatelets: Secondary | ICD-10-CM

## 2016-11-23 DIAGNOSIS — E876 Hypokalemia: Secondary | ICD-10-CM

## 2016-11-23 DIAGNOSIS — I2510 Atherosclerotic heart disease of native coronary artery without angina pectoris: Secondary | ICD-10-CM

## 2016-11-23 DIAGNOSIS — R0602 Shortness of breath: Secondary | ICD-10-CM

## 2016-11-23 DIAGNOSIS — R05 Cough: Secondary | ICD-10-CM | POA: Diagnosis not present

## 2016-11-23 DIAGNOSIS — K219 Gastro-esophageal reflux disease without esophagitis: Secondary | ICD-10-CM | POA: Diagnosis not present

## 2016-11-23 DIAGNOSIS — Z8249 Family history of ischemic heart disease and other diseases of the circulatory system: Secondary | ICD-10-CM | POA: Diagnosis not present

## 2016-11-23 DIAGNOSIS — Z951 Presence of aortocoronary bypass graft: Secondary | ICD-10-CM

## 2016-11-23 DIAGNOSIS — Z833 Family history of diabetes mellitus: Secondary | ICD-10-CM

## 2016-11-23 DIAGNOSIS — R1310 Dysphagia, unspecified: Secondary | ICD-10-CM | POA: Diagnosis not present

## 2016-11-23 DIAGNOSIS — Z66 Do not resuscitate: Secondary | ICD-10-CM | POA: Diagnosis present

## 2016-11-23 DIAGNOSIS — I959 Hypotension, unspecified: Secondary | ICD-10-CM | POA: Diagnosis present

## 2016-11-23 DIAGNOSIS — Z91048 Other nonmedicinal substance allergy status: Secondary | ICD-10-CM

## 2016-11-23 DIAGNOSIS — Z823 Family history of stroke: Secondary | ICD-10-CM

## 2016-11-23 DIAGNOSIS — F0390 Unspecified dementia without behavioral disturbance: Secondary | ICD-10-CM | POA: Diagnosis not present

## 2016-11-23 DIAGNOSIS — Z794 Long term (current) use of insulin: Secondary | ICD-10-CM

## 2016-11-23 DIAGNOSIS — N179 Acute kidney failure, unspecified: Secondary | ICD-10-CM

## 2016-11-23 DIAGNOSIS — I5032 Chronic diastolic (congestive) heart failure: Secondary | ICD-10-CM | POA: Diagnosis not present

## 2016-11-23 DIAGNOSIS — E785 Hyperlipidemia, unspecified: Secondary | ICD-10-CM | POA: Diagnosis present

## 2016-11-23 DIAGNOSIS — I110 Hypertensive heart disease with heart failure: Secondary | ICD-10-CM

## 2016-11-23 DIAGNOSIS — F419 Anxiety disorder, unspecified: Secondary | ICD-10-CM | POA: Diagnosis not present

## 2016-11-23 HISTORY — DX: Hypokalemia: E87.6

## 2016-11-23 LAB — RESPIRATORY PANEL BY PCR
Adenovirus: NOT DETECTED
BORDETELLA PERTUSSIS-RVPCR: NOT DETECTED
CORONAVIRUS 229E-RVPPCR: NOT DETECTED
Chlamydophila pneumoniae: NOT DETECTED
Coronavirus HKU1: NOT DETECTED
Coronavirus NL63: NOT DETECTED
Coronavirus OC43: NOT DETECTED
INFLUENZA B-RVPPCR: NOT DETECTED
Influenza A: NOT DETECTED
MYCOPLASMA PNEUMONIAE-RVPPCR: NOT DETECTED
Metapneumovirus: NOT DETECTED
Parainfluenza Virus 1: NOT DETECTED
Parainfluenza Virus 2: NOT DETECTED
Parainfluenza Virus 3: NOT DETECTED
Parainfluenza Virus 4: NOT DETECTED
RESPIRATORY SYNCYTIAL VIRUS-RVPPCR: NOT DETECTED
Rhinovirus / Enterovirus: NOT DETECTED

## 2016-11-23 LAB — GLUCOSE, CAPILLARY
GLUCOSE-CAPILLARY: 56 mg/dL — AB (ref 65–99)
GLUCOSE-CAPILLARY: 83 mg/dL (ref 65–99)
GLUCOSE-CAPILLARY: 82 mg/dL (ref 65–99)
GLUCOSE-CAPILLARY: 88 mg/dL (ref 65–99)
GLUCOSE-CAPILLARY: 91 mg/dL (ref 65–99)
GLUCOSE-CAPILLARY: 176 mg/dL — AB (ref 65–99)
Glucose-Capillary: 174 mg/dL — ABNORMAL HIGH (ref 65–99)
Glucose-Capillary: 78 mg/dL (ref 65–99)

## 2016-11-23 LAB — COMPREHENSIVE METABOLIC PANEL
ALBUMIN: 2.8 g/dL — AB (ref 3.5–5.0)
ALK PHOS: 66 U/L (ref 38–126)
ALT: 17 U/L (ref 14–54)
ANION GAP: 7 (ref 5–15)
AST: 22 U/L (ref 15–41)
BILIRUBIN TOTAL: 0.7 mg/dL (ref 0.3–1.2)
BUN: 30 mg/dL — ABNORMAL HIGH (ref 6–20)
CALCIUM: 10 mg/dL (ref 8.9–10.3)
CO2: 32 mmol/L (ref 22–32)
Chloride: 100 mmol/L — ABNORMAL LOW (ref 101–111)
Creatinine, Ser: 1.24 mg/dL — ABNORMAL HIGH (ref 0.44–1.00)
GFR, EST AFRICAN AMERICAN: 44 mL/min — AB (ref >60)
GFR, EST NON AFRICAN AMERICAN: 38 mL/min — AB (ref >60)
GLUCOSE: 82 mg/dL (ref 65–99)
POTASSIUM: 3.3 mmol/L — AB (ref 3.5–5.1)
Sodium: 139 mmol/L (ref 135–145)
TOTAL PROTEIN: 6.6 g/dL (ref 6.5–8.1)

## 2016-11-23 LAB — BASIC METABOLIC PANEL
Anion gap: 9 (ref 5–15)
BUN: 28 mg/dL — AB (ref 6–20)
CO2: 30 mmol/L (ref 22–32)
CREATININE: 1.12 mg/dL — AB (ref 0.44–1.00)
Calcium: 9.8 mg/dL (ref 8.9–10.3)
Chloride: 100 mmol/L — ABNORMAL LOW (ref 101–111)
GFR calc Af Amer: 50 mL/min — ABNORMAL LOW (ref >60)
GFR, EST NON AFRICAN AMERICAN: 43 mL/min — AB (ref >60)
GLUCOSE: 89 mg/dL (ref 65–99)
POTASSIUM: 3.2 mmol/L — AB (ref 3.5–5.1)
SODIUM: 139 mmol/L (ref 135–145)

## 2016-11-23 LAB — CBC
HEMATOCRIT: 32.9 % — AB (ref 36.0–46.0)
HEMOGLOBIN: 11.2 g/dL — AB (ref 12.0–15.0)
MCH: 27.5 pg (ref 26.0–34.0)
MCHC: 34 g/dL (ref 30.0–36.0)
MCV: 80.6 fL (ref 78.0–100.0)
Platelets: 266 10*3/uL (ref 150–400)
RBC: 4.08 MIL/uL (ref 3.87–5.11)
RDW: 14.2 % (ref 11.5–15.5)
WBC: 12.1 10*3/uL — AB (ref 4.0–10.5)

## 2016-11-23 MED ORDER — ACETAMINOPHEN 650 MG RE SUPP: 650.0000 mg | Freq: Four times a day (QID) | RECTAL | Status: DC | PRN

## 2016-11-23 MED ORDER — SENNA 8.6 MG PO TABS
1.0000 | ORAL_TABLET | Freq: Two times a day (BID) | ORAL | Status: DC
  Administered 2016-11-23 – 2016-11-24 (×2): 8.6 mg via ORAL
  Filled 2016-11-23 (×3): qty 1

## 2016-11-23 MED ORDER — LORATADINE 10 MG PO TABS
10.0000 mg | ORAL_TABLET | Freq: Every day | ORAL | Status: DC
Start: 2016-11-23 — End: 2016-11-24
  Administered 2016-11-24: 10 mg via ORAL
  Filled 2016-11-23 (×2): qty 1

## 2016-11-23 MED ORDER — MEMANTINE HCL ER 28 MG PO CP24
28.0000 mg | ORAL_CAPSULE | Freq: Every day | ORAL | Status: DC
  Administered 2016-11-24: 28 mg via ORAL
  Filled 2016-11-23 (×2): qty 1

## 2016-11-23 MED ORDER — ONDANSETRON HCL 4 MG PO TABS: 4.0000 mg | ORAL_TABLET | Freq: Four times a day (QID) | ORAL | Status: DC | PRN

## 2016-11-23 MED ORDER — SODIUM CHLORIDE 0.9 % IV BOLUS (SEPSIS)
500.0000 mL | Freq: Once | INTRAVENOUS | Status: AC
  Administered 2016-11-23: 500 mL via INTRAVENOUS

## 2016-11-23 MED ORDER — CLOPIDOGREL BISULFATE 75 MG PO TABS
75.0000 mg | ORAL_TABLET | Freq: Every day | ORAL | Status: DC
  Administered 2016-11-24: 75 mg via ORAL
  Filled 2016-11-23 (×2): qty 1

## 2016-11-23 MED ORDER — INSULIN ASPART 100 UNIT/ML ~~LOC~~ SOLN
0.0000 [IU] | Freq: Three times a day (TID) | SUBCUTANEOUS | Status: DC
  Administered 2016-11-24: 3 [IU] via SUBCUTANEOUS
  Administered 2016-11-24: 8 [IU] via SUBCUTANEOUS

## 2016-11-23 MED ORDER — GUAIFENESIN ER 600 MG PO TB12
600.0000 mg | ORAL_TABLET | Freq: Two times a day (BID) | ORAL | Status: DC
  Administered 2016-11-23 – 2016-11-24 (×2): 600 mg via ORAL
  Filled 2016-11-23 (×3): qty 1

## 2016-11-23 MED ORDER — POLYETHYLENE GLYCOL 3350 17 G PO PACK
17.0000 g | PACK | Freq: Every day | ORAL | Status: DC
  Administered 2016-11-24: 17 g via ORAL
  Filled 2016-11-23 (×2): qty 1

## 2016-11-23 MED ORDER — LINACLOTIDE 145 MCG PO CAPS
145.0000 ug | ORAL_CAPSULE | Freq: Every day | ORAL | Status: DC | PRN
  Filled 2016-11-23: qty 1

## 2016-11-23 MED ORDER — DOCUSATE SODIUM 100 MG PO CAPS
100.0000 mg | ORAL_CAPSULE | Freq: Two times a day (BID) | ORAL | Status: DC
  Administered 2016-11-23 – 2016-11-24 (×2): 100 mg via ORAL
  Filled 2016-11-23 (×3): qty 1

## 2016-11-23 MED ORDER — HEPARIN SODIUM (PORCINE) 5000 UNIT/ML IJ SOLN
5000.0000 [IU] | Freq: Three times a day (TID) | INTRAMUSCULAR | Status: DC
  Administered 2016-11-23 – 2016-11-24 (×4): 5000 [IU] via SUBCUTANEOUS
  Filled 2016-11-23 (×5): qty 1

## 2016-11-23 MED ORDER — ATORVASTATIN CALCIUM 80 MG PO TABS
80.0000 mg | ORAL_TABLET | Freq: Every day | ORAL | Status: DC
  Administered 2016-11-24: 80 mg via ORAL
  Filled 2016-11-23: qty 2
  Filled 2016-11-23: qty 1

## 2016-11-23 MED ORDER — IPRATROPIUM-ALBUTEROL 0.5-2.5 (3) MG/3ML IN SOLN: 3.0000 mL | Freq: Four times a day (QID) | RESPIRATORY_TRACT | Status: DC | PRN

## 2016-11-23 MED ORDER — NYSTATIN 100000 UNIT/GM EX POWD
Freq: Two times a day (BID) | CUTANEOUS | Status: DC | PRN
  Filled 2016-11-23: qty 15

## 2016-11-23 MED ORDER — GLUCERNA SHAKE PO LIQD: 237.0000 mL | Freq: Three times a day (TID) | ORAL | Status: DC | PRN

## 2016-11-23 MED ORDER — ACETAMINOPHEN 325 MG PO TABS
650.0000 mg | ORAL_TABLET | Freq: Four times a day (QID) | ORAL | Status: DC | PRN
  Administered 2016-11-23: 650 mg via ORAL
  Filled 2016-11-23: qty 2

## 2016-11-23 MED ORDER — ALPRAZOLAM 0.25 MG PO TABS: 0.1250 mg | ORAL_TABLET | Freq: Two times a day (BID) | ORAL | Status: DC | PRN

## 2016-11-23 MED ORDER — SODIUM CHLORIDE 0.9% FLUSH
3.0000 mL | Freq: Two times a day (BID) | INTRAVENOUS | Status: DC
  Administered 2016-11-23 – 2016-11-24 (×3): 3 mL via INTRAVENOUS

## 2016-11-23 MED ORDER — INSULIN GLARGINE 100 UNIT/ML ~~LOC~~ SOLN
15.0000 [IU] | Freq: Two times a day (BID) | SUBCUTANEOUS | Status: DC
  Administered 2016-11-24: 15 [IU] via SUBCUTANEOUS
  Filled 2016-11-23 (×4): qty 0.15

## 2016-11-23 MED ORDER — SULFAMETHOXAZOLE-TRIMETHOPRIM 800-160 MG PO TABS
1.0000 | ORAL_TABLET | Freq: Two times a day (BID) | ORAL | Status: DC
  Administered 2016-11-23 – 2016-11-24 (×2): 1 via ORAL
  Filled 2016-11-23 (×4): qty 1

## 2016-11-23 MED ORDER — LACTULOSE 10 GM/15ML PO SOLN: 30.0000 g | Freq: Every day | ORAL | Status: DC | PRN

## 2016-11-23 MED ORDER — HYDRALAZINE HCL 50 MG PO TABS
50.0000 mg | ORAL_TABLET | Freq: Three times a day (TID) | ORAL | Status: DC
  Administered 2016-11-23 – 2016-11-24 (×4): 50 mg via ORAL
  Filled 2016-11-23 (×5): qty 1

## 2016-11-23 MED ORDER — NEBIVOLOL HCL 10 MG PO TABS
10.0000 mg | ORAL_TABLET | Freq: Every day | ORAL | Status: DC
  Administered 2016-11-24: 10 mg via ORAL
  Filled 2016-11-23 (×2): qty 1

## 2016-11-23 MED ORDER — SULFAMETHOXAZOLE-TRIMETHOPRIM 800-160 MG PO TABS: 1.0000 | ORAL_TABLET | Freq: Two times a day (BID) | ORAL | 0 refills | Status: AC

## 2016-11-23 MED ORDER — ONDANSETRON HCL 4 MG/2ML IJ SOLN: 4.0000 mg | Freq: Four times a day (QID) | INTRAMUSCULAR | Status: DC | PRN

## 2016-11-23 MED ORDER — PANTOPRAZOLE SODIUM 40 MG PO TBEC
40.0000 mg | DELAYED_RELEASE_TABLET | Freq: Every day | ORAL | Status: DC
  Administered 2016-11-24: 40 mg via ORAL
  Filled 2016-11-23 (×2): qty 1

## 2016-11-23 NOTE — Progress Notes (Addendum)
MD Strelow returned page, stated to hold morning insulin and will place order for patient diet. MD aware pt refusing morning medications at this time Christelle Igoe Leory Plowman

## 2016-11-23 NOTE — Progress Notes (Signed)
Paged MD regarding pt not wanting to take medication, clarification on pt insulin and requesting pt diet change. Awaiting call back.  Katherine [name]

## 2016-11-23 NOTE — Evaluation (Signed)
Physical Therapy Evaluation Patient Details Name: Katherine [name] MRN: [id number] DOB: [birth year] Today's Date: 11/23/2016   History of Present Illness  82 y.o. female with h/o CHF, CAD, t2DM who presents w/ c/o apnea and weakness  Clinical Impression  Patient demonstrates deficits in functional mobility as indicated below. Will need continued skilled PT to address deficits and maximzie function. At this time, patient required 2 person max to total assist. Do not feel patient is safe for d/c home as patient remains significantly weak and was recently d/c home and was unable to improve. Recommend SNF.    Follow Up Recommendations SNF;Supervision/Assistance - 24 hour (patient is not safe for d/c home, requires +2 total assist)    Equipment Recommendations  Wheelchair (measurements PT);Other (comment) (hoyer lift)    Recommendations for Other Services       Precautions / Restrictions Precautions Precautions: Fall Restrictions Weight Bearing Restrictions: No      Mobility  Bed Mobility Overal bed mobility: Needs Assistance Bed Mobility: Rolling;Supine to Sit Rolling: Max assist;+2 for physical assistance   Supine to sit: Total assist;+2 for physical assistance     General bed mobility comments: unable to come to upright despite total assist of 2 persons  Transfers                 General transfer comment: unable to perform  Ambulation/Gait                Stairs            Wheelchair Mobility    Modified Rankin (Stroke Patients Only)       Balance                                             Pertinent Vitals/Pain Pain Assessment: Faces Faces Pain Scale: Hurts a little bit Pain Location: generalized Pain Intervention(s): Monitored during session    Home Living Family/patient expects to be discharged to:: Private residence Living Arrangements: Children Available Help at Discharge: Family;Available  PRN/intermittently             Additional Comments: patient poor historian, unable to provide home set up    Prior Function Level of Independence: Needs assistance         Comments: unable to determine prior level of function as patient is poor historied     Hand Dominance   Dominant Hand: Right    Extremity/Trunk Assessment   Upper Extremity Assessment Upper Extremity Assessment: Generalized weakness    Lower Extremity Assessment Lower Extremity Assessment: Generalized weakness    Cervical / Trunk Assessment Cervical / Trunk Assessment:  (increased body habitus)  Communication   Communication: HOH  Cognition Arousal/Alertness: Awake/alert Behavior During Therapy: Flat affect Overall Cognitive Status: History of cognitive impairments - at baseline                      General Comments      Exercises     Assessment/Plan    PT Assessment Patient needs continued PT services  PT Problem List Decreased strength;Decreased range of motion;Decreased activity tolerance;Decreased balance;Decreased mobility;Decreased cognition;Decreased safety awareness;Cardiopulmonary status limiting activity          PT Treatment Interventions DME instruction;Gait training;Functional mobility training;Therapeutic activities;Therapeutic exercise;Balance training;Neuromuscular re-education;Cognitive remediation;Patient/family education;Wheelchair mobility training    PT Goals (Current goals can be found in the Care Plan  section)  Acute Rehab PT Goals Patient Stated Goal: to get better PT Goal Formulation: With patient Time For Goal Achievement: 12/07/16 Potential to Achieve Goals: Fair    Frequency Min 2X/week   Barriers to discharge Decreased caregiver support      Co-evaluation               End of Session   Activity Tolerance: No increased pain;Other (comment) (extreme weakness, inability to assist) Patient left: in bed;with call bell/phone within  reach;with bed alarm set Nurse Communication: Mobility status;Need for lift equipment    Functional Assessment Tool Used: clinical judgement Functional Limitation: Mobility: Walking and moving around Mobility: Walking and Moving Around Current Status [phone]): At least 20 percent but less than 40 percent impaired, limited or restricted Mobility: Walking and Moving Around Goal Status [phone]): At least 60 percent but less than 80 percent impaired, limited or restricted    Time: 1204-1221 PT Time Calculation (min) (ACUTE ONLY): 17 min   Charges:   PT Evaluation $PT Eval Moderate Complexity: 1 Procedure     PT G Codes:   PT G-Codes **NOT FOR INPATIENT CLASS** Functional Assessment Tool Used: clinical judgement Functional Limitation: Mobility: Walking and moving around Mobility: Walking and Moving Around Current Status (D4081): At least 20 percent but less than 40 percent impaired, limited or restricted Mobility: Walking and Moving Around Goal Status [phone]): At least 60 percent but less than 80 percent impaired, limited or restricted    Duncan Dull 11/23/2016, 12:22 PM Alben Deeds, [place] DPT  [phone]

## 2016-11-23 NOTE — Progress Notes (Signed)
Patient arrived in the unit accompanied by RN and two family members via stretcher. Orientation to the unit given.Patiernt  and family members verbalizes understanding.

## 2016-11-23 NOTE — Discharge Summary (Signed)
Name: Katherine [name] MRN: [id number] DOB: 28-Jan-1928 82 y.o. PCP: Celene Squibb, MD  Date of Admission: 11/22/2016  4:48 PM Date of Discharge: 11/23/2016 Attending Physician: Lucious Groves, DO  Discharge Diagnosis: 1. UTI 2. Acute Bronchitis 3. Dementia  Active Problems:   SOB (shortness of breath)   Cough   UTI (urinary tract infection)   Hypokalemia   Discharge Medications: Allergies as of 11/23/2016      Reactions   Tape Other (See Comments)   SKIN IS VERY THIN AND WILL TEAR EASILY!!      Medication List    TAKE these medications   acetaminophen 500 MG tablet Commonly known as:  TYLENOL Take two tablets by mouth twice daily for pain. Do not exceed 4gm of Tylenol in 24 hours   ALPRAZolam 0.25 MG tablet Commonly known as:  XANAX Take 0.5 tablets (0.125 mg total) by mouth 2 (two) times daily as needed for anxiety or sleep. What changed:  how much to take  when to take this   atorvastatin 80 MG tablet Commonly known as:  LIPITOR Take 80 mg by mouth daily.   clopidogrel 75 MG tablet Commonly known as:  PLAVIX Take 1 tablet (75 mg total) by mouth daily with breakfast.   fexofenadine 180 MG tablet Commonly known as:  ALLEGRA Take 180 mg by mouth daily.   furosemide 20 MG tablet Commonly known as:  LASIX Take 2 tablets (40 mg total) by mouth daily. <PLEASE MAKE APPOINTMENT FOR REFILLS> What changed:  additional instructions   guaiFENesin 600 MG 12 hr tablet Commonly known as:  MUCINEX Take 1 tablet (600 mg total) by mouth 2 (two) times daily. What changed:  when to take this  reasons to take this  additional instructions   hydrALAZINE 50 MG tablet Commonly known as:  APRESOLINE Take 1 tablet (50 mg total) by mouth every 8 (eight) hours. What changed:  additional instructions   insulin detemir 100 UNIT/ML injection Commonly known as:  LEVEMIR Inject 0.3 mLs (30 Units total) into the skin 2 (two) times daily. What changed:  when to  take this  additional instructions   Ipratropium-Albuterol 20-100 MCG/ACT Aers respimat Commonly known as:  COMBIVENT RESPIMAT Inhale 2 puffs into the lungs every 6 (six) hours as needed for wheezing or shortness of breath.   lactulose 10 GM/15ML solution Commonly known as:  CHRONULAC Take 45 mLs (30 g total) by mouth daily as needed for mild constipation or severe constipation.   LINZESS 145 MCG Caps capsule Generic drug:  linaclotide Take 145 mcg by mouth daily as needed (for constipation).   lisinopril 10 MG tablet Commonly known as:  PRINIVIL,ZESTRIL Take 1 tablet (10 mg total) by mouth daily.   loperamide 2 MG capsule Commonly known as:  IMODIUM Take 2 mg by mouth as needed for diarrhea or loose stools.   meloxicam 7.5 MG tablet Commonly known as:  MOBIC Take 7.5 mg by mouth daily as needed for pain.   metFORMIN 500 MG 24 hr tablet Commonly known as:  GLUCOPHAGE-XR Take 500 mg by mouth daily with breakfast. To control blood sugar. Do not crush.   NAMENDA XR 28 MG Cp24 24 hr capsule Generic drug:  memantine Take one capsule by mouth every morning for dementia   nebivolol 10 MG tablet Commonly known as:  BYSTOLIC Take one tablet by mouth every morning for blood pressure   NEXIUM 20 MG capsule Generic drug:  esomeprazole Take one capsule by mouth once  daily for reflux   nystatin powder Generic drug:  nystatin Apply 1 application topically 2 (two) times daily as needed for for breast care.   polyethylene glycol packet Commonly known as:  MIRALAX / GLYCOLAX Take 17 g by mouth daily.   potassium chloride SA 20 MEQ tablet Commonly known as:  K-DUR,KLOR-CON Take 20 mEq by mouth daily.   sulfamethoxazole-trimethoprim 800-160 MG tablet Commonly known as:  BACTRIM DS,SEPTRA DS Take 1 tablet by mouth 2 (two) times daily.   traMADol 50 MG tablet Commonly known as:  ULTRAM Take 1-2 tablets (50-100 mg total) by mouth every 4 (four) hours as needed for moderate  pain.   VOLTAREN 1 % Gel Generic drug:  diclofenac sodium Apply 2 g topically daily as needed (pain).       Disposition and follow-up:   Katherine [name] was discharged from [facility] in Good condition.  At the hospital follow up visit please address:  1.  UTI: ensure completion of 3d abx course and resolution of symptoms. Acute Bronchitis: continue to monitor respiratory status  Follow-up Appointments: Follow-up Information    Wende Neighbors, MD. Go on 12/01/2016.   Specialty:  Internal Medicine Contact information: [place] [address] [phone]           Hospital Course by problem list: Active Problems:   SOB (shortness of breath)   Cough   UTI (urinary tract infection)   Hypokalemia   1. UTI: pt presents w/ vague complaints of weakness and concern for apnea. Found to have pyuria and bacturia. No complaints, but may be complicated by dementia. Will plan to treat for 3 days w/ bactrim. SCr mildly elevated on admission, trended down w/ IVF.  2. Acute bronchitis: presents w/ cough, non-productive. Improved from 2 days prior. No signs of PNA on CXR. Leukocytosis resolving. Will continue supportive care w/ Mucinex.  3. Dementia: pt appears to have significant baseline dementia which may have some waxing and waning in presentation. Family is concerned about difficulty taking care of the patient at home and we would recommend continued evaluation of needs for home health vs ALF vs SNF.  Discharge Vitals:   BP (!) 161/45 (BP Location: Right Arm)   Pulse 71   Temp 99.3 F (37.4 C) (Oral)   Resp 18   Ht 5\' 2"  (1.575 m)   Wt 184 lb 6.4 oz (83.6 kg)   SpO2 93%   BMI 33.73 kg/m   Pertinent Labs, Studies, and Procedures: UA - too many to count WBC, many bacteria  Procedures Performed:  Dg Chest 2 View IMPRESSION: No acute cardiopulmonary abnormality. Calcified aortic atherosclerosis.  Chronic cardiomegaly.    Ct Head Wo  Contrast  IMPRESSION: No CT evidence for acute intracranial abnormality. Moderate periventricular and subcortical white matter hypodensity consistent with small vessel disease.  Discharge Instructions: Discharge Instructions    Diet - low sodium heart healthy    Complete by:  As directed    Discharge instructions    Complete by:  As directed    You likely have a urinary tract infection which we are treating with antibiotics for 3 days. You also have a viral bronchitis which is responsible for your cough. This may take several weeks to resolve, but will get better on its own. You may take Mucinex as needed for the cough and congestion.  You should keep your appointment to follow up with your Primary Doctor.[id number]   Increase activity slowly  Complete by:  As directed       Signed: Holley Raring, MD 11/23/2016, 1:12 PM   Pager: [phone]

## 2016-11-23 NOTE — H&P (Signed)
Internal Medicine Attending Admission Note  I saw and evaluated the patient. I reviewed the resident's note and I agree with the resident's findings and plan as documented in the resident's note.  Assessment & Plan by Problem:   Possible SOB (shortness of breath) and Cough - She was reportedly brought in for SOB, Cough, and weakness, unclear that any hypoxia or respiratory distress in the ED as this was not documented although her respiratory rate is documented to be elevated.  She currently reports no respiratory issues and has no cough on our exam.  I do not see any intervention in the ED or so far this hospitalization that would have improved her cough.    Probable UTI (urinary tract infection) - Patient appears to be an unreliable historian, she does not have any urinary complaints.  I would argue that her U/A demonstrates clear evidence of increasing pyuria with few squamous epithelial cells suggesting a UTI.  This could explain her weakness and lethargy.  She has a mildly elevated WBC count that actually appears to be improving.  I do not think she needs IV antibiotics, I would treated as an uncomplicated UTI with a 3 day course of oral antibiotics, will start Bactrim BID.  AKI - Likely 2/2 poor PO intake.  Has improved with 500 cc bolus, will repeat a second 500 cc bolus and repeat BMP this afternoon.  If resolved she may be discharged home.  Possible Hypercalcemia - Noted to have mild hypercalcemia on labs, will check ionized calcium to confirm.    Hypokalemia - replete   Chief Complaint(s):weakness  History - key components related to admission: Briefly Katherine [name] is an 82 year old female with past medical history of type 2 diabetes, dementia, hypertension, chronic diastolic congestive heart failure, coronary artery disease status post CABG, aortic stenosis status post TAVR who was discharged from our service 2 days ago after an episode of flash pulmonary edema secondary to  hypertension from missed blood pressure medications. During that hospitalization she was worked up for infection due to an elevated white blood cell count. She had no evidence of pneumonia, her UA showed some mild pyuria but otherwise appeared clean and she denied any urinary symptoms.  However apparently after she went home her family felt that she was less talkative, increasingly lethargic, and reportedly had a coughing spell that made her turn blue. They otherwise reported that her cough had improved and her respiratory status was overall and lucid improved from her previous discharge. In the emergency department apparently only a limited history was obtained due to her underlying dementia and confusion. A UA was obtained which actually showed increasing pyuria and she was admitted to the IMTS for further observation. Today on exam she is alert to self only. She denies any shortness of breath, she reports that her cough is greatly improved. She denies any dysuria or increased urinary frequency  Lab results: Reviewed in Epic  Physical Exam - key components related to admission: General: elderly female resting in bed HEENT: no scleral icterus Cardiac: RRR Pulm: clear to auscultation bilaterally Abd: soft, nontender, nondistended, BS present Ext: warm and well perfused, no pedal edema Neuro: alert and oriented X1, cranial nerves II-XII grossly intact, no facial droop appreciated  Vitals:   11/23/16 0046 11/23/16 0142 11/23/16 0503 11/23/16 0800  BP: 133/68 (!) 150/57 (!) 162/52 (!) 137/58  Pulse: 62 70 62 71  Resp: 17 18 18 18   Temp:  99.3 F (37.4 C) 99.1 F (37.3 C)  97.6 F (36.4 C)  TempSrc:  Oral Oral Oral  SpO2: 93% 94% 94% 93%  Weight:  184 lb 6.4 oz (83.6 kg)    Height:  5\' 2"  (1.575 m)

## 2016-11-23 NOTE — Progress Notes (Signed)
Paged MD for pt discontinued discharge orders. MD stated social work consult to be completed before discharge due to PT recommendation.   Katherine [name]

## 2016-11-23 NOTE — Progress Notes (Signed)
Called daughter Katherine [name] to inform of discharge orders.  Katherine [name]

## 2016-11-23 NOTE — Progress Notes (Addendum)
Subjective: Currently, the patient is feeling well w/o complain of SOB. She looks comfortable in bed and is agreeable to discharge today. She still has some cough, non-productive. Oriented only to self, but answers questions appropriately.  Objective: Vital signs in last 24 hours: Vitals:   11/23/16 0046 11/23/16 0142 11/23/16 0503 11/23/16 0800  BP: 133/68 (!) 150/57 (!) 162/52 (!) 137/58  Pulse: 62 70 62 71  Resp: 17 18 18 18   Temp:  99.3 F (37.4 C) 99.1 F (37.3 C) 97.6 F (36.4 C)  TempSrc:  Oral Oral Oral  SpO2: 93% 94% 94% 93%  Weight:  184 lb 6.4 oz (83.6 kg)    Height:  5\' 2"  (1.575 m)     Physical Exam: Physical Exam  Constitutional: She appears well-developed. She is cooperative. No distress.  Cardiovascular: Normal rate, regular rhythm, normal heart sounds and normal pulses.  Exam reveals no gallop.   No murmur heard. Pulmonary/Chest: Effort normal and breath sounds normal. No respiratory distress. Breasts are symmetrical.  Abdominal: Soft. Bowel sounds are normal. There is no tenderness.  Musculoskeletal: She exhibits no edema.  Neurological: She is alert. She is disoriented (oriented to person only).   Labs: CBC:  Recent Labs Lab 11/20/16 0437 11/21/16 0255 11/22/16 1817 11/23/16 0415  WBC 13.2* 14.4* 14.5* 12.1*  NEUTROABS 9.8*  --  11.1*  --   HGB 13.0 11.4* 12.4 11.2*  HCT 37.4 33.6* 35.3* 32.9*  MCV 79.6 80.4 79.1 80.6  PLT 292 254 277 412   Metabolic Panel:  Recent Labs Lab 11/20/16 0437 11/21/16 0255 11/22/16 1817 11/23/16 0415  NA 138 139 137 139  K 3.8 3.8 3.7 3.3*  CL 100* 99* 97* 100*  CO2 29 30 30  32  GLUCOSE 163* 134* 138* 82  BUN 11 14 28* 30*  CREATININE 0.87 0.89 1.53* 1.24*  CALCIUM 10.5* 10.2 10.4* 10.0  ALT  --   --  18 17  ALKPHOS  --   --  73 66  BILITOT  --   --  0.4 0.7  PROT  --   --  7.5 6.6  ALBUMIN  --   --  3.4* 2.8*   Cardiac Labs:  Recent Labs Lab 11/20/16 0437 11/20/16 0440 11/22/16 1817  11/22/16 1855  TROPIPOC  --  0.02  --  0.01  BNP 211.3*  --  137.5*  --    BG:  Recent Labs Lab 11/21/16 1142 11/23/16 0151 11/23/16 0244 11/23/16 0454 11/23/16 0741  GLUCAP 180* 56* 83 82 88     Lab Results  Component Value Date   HGBA1C 6.7 (H) 12/16/2015     Medications: Scheduled Medications: . atorvastatin  80 mg Oral Daily  . clopidogrel  75 mg Oral Daily  . docusate sodium  100 mg Oral BID  . guaiFENesin  600 mg Oral BID  . heparin  5,000 Units Subcutaneous Q8H  . hydrALAZINE  50 mg Oral Q8H  . insulin aspart  0-15 Units Subcutaneous TID WC  . insulin glargine  15 Units Subcutaneous BID  . loratadine  10 mg Oral Daily  . memantine  28 mg Oral Daily  . nebivolol  10 mg Oral Daily  . pantoprazole  40 mg Oral Daily  . polyethylene glycol  17 g Oral Daily  . senna  1 tablet Oral BID  . sodium chloride  500 mL Intravenous Once  . sodium chloride flush  3 mL Intravenous Q12H   PRN Medications: acetaminophen **OR** acetaminophen,  ALPRAZolam, feeding supplement (GLUCERNA SHAKE), ipratropium-albuterol, lactulose, linaclotide, nystatin, ondansetron **OR** ondansetron (ZOFRAN) IV  Assessment/Plan: [name] is a 82 y.o. female with h/o CHF, CAD, t2DM who presents w/ c/o apnea and weakness.  1) Report of L facial droop: not present on exam w/o other focal deficits. Imaging negative. CTM  2) Acute bronchitis: Pt continues to have non-productive cough, reports improvement since last admission. Without signs of PNA. RVP and influenza PCR pending. - duonebs PRN - guafenasin PRN  3) ?UTI: pt w/o symptoms but worsened pyuria and bacturia on UA. Will treat w/ bactrim x 3d. - bactrim  3) HTN: Stable. Continue home meds.  4) Dementia: Pt likely has progression of underlying dementia which is presenting increasing difficulty for her daughter who is primary caretaker. Should likely have more institutional support such as ALF/SNF and would recommend Social  Work c/s to provide resources to family.  5) t2DM: Home levemir (30U BID) and metformin. Will give 15U BID + SSI-m.  6) CAD: continue home plavix, statin  Length of Stay: 1 day(s) Dispo: Anticipated discharge today.  Holley Raring, MD Pager: [phone] (7AM-5PM) 11/23/2016, 11:20 AM

## 2016-11-24 DIAGNOSIS — J209 Acute bronchitis, unspecified: Principal | ICD-10-CM

## 2016-11-24 LAB — GLUCOSE, CAPILLARY
GLUCOSE-CAPILLARY: 151 mg/dL — AB (ref 65–99)
GLUCOSE-CAPILLARY: 194 mg/dL — AB (ref 65–99)
GLUCOSE-CAPILLARY: 263 mg/dL — AB (ref 65–99)
GLUCOSE-CAPILLARY: 189 mg/dL — AB (ref 65–99)

## 2016-11-24 LAB — URINE CULTURE: Culture: >=100000 — AB

## 2016-11-24 LAB — INFLUENZA PANEL BY PCR (TYPE A & B)
INFLAPCR: NEGATIVE
INFLBPCR: NEGATIVE

## 2016-11-24 LAB — HEMOGLOBIN A1C
Hgb A1c MFr Bld: 6 % — ABNORMAL HIGH (ref 4.8–5.6)
Mean Plasma Glucose: 126 mg/dL

## 2016-11-24 LAB — CALCIUM, IONIZED: CALCIUM, IONIZED, SERUM: 5.8 mg/dL — AB (ref 4.5–5.6)

## 2016-11-24 LAB — VITAMIN D 25 HYDROXY (VIT D DEFICIENCY, FRACTURES): VIT D 25 HYDROXY: 20.2 ng/mL — AB (ref 30.0–100.0)

## 2016-11-24 MED ORDER — VITAMIN D (ERGOCALCIFEROL) 1.25 MG (50000 UNIT) PO CAPS: 50000.0000 [IU] | ORAL_CAPSULE | ORAL | 0 refills | Status: DC

## 2016-11-24 MED ORDER — VITAMIN D (ERGOCALCIFEROL) 1.25 MG (50000 UNIT) PO CAPS
50000.0000 [IU] | ORAL_CAPSULE | ORAL | Status: DC
  Administered 2016-11-24: 50000 [IU] via ORAL
  Filled 2016-11-24: qty 1

## 2016-11-24 NOTE — Progress Notes (Signed)
   Subjective: Currently, the patient is feeling well w/o complaint. Eating breakfast. Agreeable to DC today.  Objective: Vital signs in last 24 hours: Vitals:   11/23/16 0046 11/23/16 0142 11/23/16 0503 11/23/16 0800  BP: 133/68 (!) 150/57 (!) 162/52 (!) 137/58  Pulse: 62 70 62 71  Resp: 17 18 18 18   Temp:  99.3 F (37.4 C) 99.1 F (37.3 C) 97.6 F (36.4 C)  TempSrc:  Oral Oral Oral  SpO2: 93% 94% 94% 93%  Weight:  184 lb 6.4 oz (83.6 kg)    Height:  5\' 2"  (1.575 m)     Physical Exam: Physical Exam  Constitutional: She appears well-developed. She is cooperative. No distress.  Cardiovascular: Normal rate, regular rhythm, normal heart sounds and normal pulses.  Exam reveals no gallop.   No murmur heard. Pulmonary/Chest: Effort normal and breath sounds normal. No respiratory distress. Breasts are symmetrical.  Abdominal: Soft. Bowel sounds are normal. There is no tenderness.  Musculoskeletal: She exhibits no edema.  Neurological: She is alert. She is disoriented (oriented to person only).   Assessment/Plan: [name] is a 82 y.o. female with h/o CHF, CAD, t2DM who presents w/ c/o apnea and weakness.  1) Report of L facial droop: not present on exam w/o other focal deficits. Imaging negative. CTM  2) Acute bronchitis: Pt continues to have non-productive cough, reports improvement since last admission. Without signs of PNA. RVP and influenza PCR pending. - duonebs PRN - guafenasin PRN  3) UTI: pt w/o symptoms but worsened pyuria and bacturia on UA. Will treat w/ bactrim x 3d. - continue bactrim  3) HTN: Stable. Continue home meds.  4) Dementia: Pt likely has progression of underlying dementia which is presenting increasing difficulty for her daughter who is primary caretaker. Should likely have more institutional support such as ALF/SNF and would recommend Social Work c/s to provide resources to family.  5) t2DM: Home levemir (30U BID) and metformin.  Will give 15U BID + SSI-m.  6) CAD: continue home plavix, statin  Length of Stay: 1 day(s) Dispo: Anticipated discharge today. HH orders placed.  Holley Raring, MD Pager: [phone] (7AM-5PM) 11/23/2016, 11:20 AM

## 2016-11-24 NOTE — Care Management Note (Signed)
Case Management Note  Patient Details  Name: MEELAH TALLO MRN: [id number] Date of Birth: 09-14-1928  Subjective/Objective:    Admitted with Cough               Action/Plan: Late entry from 11/23/2016 @ 5:20 pm; CM talked to daughter [name]; PCP is Celene Squibb, MD; has private insurance with Medicare / Medicaid with prescription drug coverage; she has a person care service at home receiving 52.5 hrs of care per week; discharge plan is to return home per daughter (also pt is Observation/ Medicare - to go to a SNF patient needs a qualifying 3 day stay for Medicare to pay for SNF placement). [place] choices offered, [name] requested [facility]; Butch Penny with [facility] called for arrangements; DME - cane and walker; CM will continue to follow for DCP  Expected Discharge Date:  11/24/2016            Expected Discharge Plan:  [place]  Discharge planning Services  CM Consult   HH Arranged:  RN, PT, OT, Nurse's Aide, Social Work [employer] Agency:  [place]  Status of Service:  In process, will continue to follow  Sherrilyn Rist [phone] 11/24/2016, 7:04 AM

## 2016-11-24 NOTE — Evaluation (Signed)
Occupational Therapy Evaluation Patient Details Name: Katherine [name] MRN: [id number] DOB: 1928/10/29 Today's Date: 11/24/2016    History of Present Illness 82 y.o. female with h/o CHF, CAD, t2DM who presents w/ c/o apnea and weakness   Clinical Impression   Pt with decline in function and safety with ADLs and ADL mobility. Pt's PLOF is assist with ADLs and mobility at home. Pt would benefit from ST SNF, however pt will be d/c home with [facility] and continued daily caregiver while her daughter is working. Pt currently requires extensive assist with bed mobility and ADLs. Pt would benefit from acute OT services to address impairments to increase level of function and safety    Follow Up Recommendations  Supervision/Assistance - 24 hour    Equipment Recommendations  3 in 1 bedside commode    Recommendations for Other Services       Precautions / Restrictions Precautions Precautions: Fall Restrictions Weight Bearing Restrictions: No      Mobility Bed Mobility Overal bed mobility: Needs Assistance Bed Mobility: Sit to Supine;Supine to Sit     Supine to sit: Max assist Sit to supine: Max assist   General bed mobility comments: max A with elevating trunk and LEs back onto bed, used pad to scoot hips over. Pt used rails  Transfers                 General transfer comment: unable to perform    Balance Overall balance assessment: Needs assistance   Sitting balance-Leahy Scale: Fair       Standing balance-Leahy Scale: Zero Standing balance comment: unable                            ADL Overall ADL's : Needs assistance/impaired     Grooming: Wash/dry hands;Wash/dry face;Min guard;Sitting   Upper Body Bathing: Minimal assistance;Sitting   Lower Body Bathing: Maximal assistance   Upper Body Dressing : Minimal assistance;Sitting   Lower Body Dressing: Total assistance     Toilet Transfer Details (indicate cue type and reason): NT,  unable Toileting- Clothing Manipulation and Hygiene: Total assistance;Bed level       Functional mobility during ADLs:  (NT, most likely would require +2 assist) General ADL Comments: At [facility], pt recieves daily assist with ADLs and mobility     Vision Vision Assessment?: No apparent visual deficits              Pertinent Vitals/Pain Pain Assessment: Faces Faces Pain Scale: Hurts little more Pain Location: "all over" per pt Pain Descriptors / Indicators: Sore Pain Intervention(s): Limited activity within patient's tolerance;Monitored during session;Repositioned     Hand Dominance Right   Extremity/Trunk Assessment Upper Extremity Assessment Upper Extremity Assessment: Generalized weakness;RUE deficits/detail;LUE deficits/detail RUE Deficits / Details: shoulder flexion limited 90 degrees LUE Deficits / Details: shoulder flexion limited 90 degrees   Lower Extremity Assessment Lower Extremity Assessment: Defer to PT evaluation       Communication Communication Communication: HOH   Cognition Arousal/Alertness: Awake/alert Behavior During Therapy: WFL for tasks assessed/performed Overall Cognitive Status: History of cognitive impairments - at baseline                     General Comments   pt pleasant and cooperative                 Home Living Family/patient expects to be discharged to:: Private residence Living Arrangements: Children Available Help at Discharge: Family;Available  PRN/intermittently;Personal care attendant Type of Home: House Home Access: Ramped entrance     Home Layout: One level     Bathroom Shower/Tub: Teacher, early years/pre: Handicapped height     Home Equipment: Environmental consultant - 2 wheels;Wheelchair - manual;Tub bench   Additional Comments: patient poor historian, uncertain how accurate info given      Prior Functioning/Environment Level of Independence: Needs assistance  Gait / Transfers Assistance Needed: pt  reports that she mostly uses w/c ADL's / Homemaking Assistance Needed: pt reports assist from caregiver daily for getting OOB, bathing, dressing and toileting while her daughter is at work            OT Problem List: Decreased strength;Decreased activity tolerance;Decreased knowledge of use of DME or AE;Impaired balance (sitting and/or standing);Obesity;Pain   OT Treatment/Interventions: Self-care/ADL training;DME and/or AE instruction;Therapeutic activities;Patient/family education    OT Goals(Current goals can be found in the care plan section) Acute Rehab OT Goals Patient Stated Goal: to get better and go home OT Goal Formulation: With patient ADL Goals Pt Will Perform Grooming: with supervision;with set-up;sitting Pt Will Perform Upper Body Bathing: with min guard assist;with supervision;with set-up;sitting Pt Will Perform Upper Body Dressing: with min guard assist;with supervision;with set-up;sitting Pt Will Transfer to Toilet: with max assist;with mod assist;with +2 assist Additional ADL Goal #1: pt will complete bed mobility with mod - min A to sit EOB for ADL tasks  OT Frequency: Min 2X/week   Barriers to D/C:    no barriers, will continue with daily caregiver                     End of Session    Activity Tolerance: Patient tolerated treatment well Patient left: in chair;with call bell/phone within reach   Time: 1217-1229 OT Time Calculation (min): 12 min Charges:  OT General Charges $OT Visit: 1 Procedure OT Evaluation $OT Eval Moderate Complexity: 1 Procedure G-Codes: OT G-codes **NOT FOR INPATIENT CLASS** Functional Assessment Tool Used: clinical judgement Functional Limitation: Self care Self Care Current Status (H4327): At least 60 percent but less than 80 percent impaired, limited or restricted Self Care Goal Status (M1470): At least 60 percent but less than 80 percent impaired, limited or restricted  Britt Bottom 11/24/2016, 2:01  PM

## 2016-11-24 NOTE — Progress Notes (Signed)
Pt discharge education provided to family members at bedside. Pt IV discontinued, catheter intact and telemetry monitor removed. Pt has all belongings in hand and discharge paper work. Pt discharged via wheelchair with nurse tech and family.  Katherine [name]

## 2016-11-24 NOTE — Progress Notes (Signed)
Internal Medicine Attending:   I saw and examined the patient. I reviewed the resident's note and I agree with the resident's findings and plan as documented in the resident's note. Patient eating breakfast on rounds.  Reports she is feeling well.  Case management has discussed placement with family and family wants patient to return home, she has 52.5 hrs of home care and family will assist in her care.  She is stable for discharge today, her urine culture reveals >100,000 colonies of lactobacillius most commonly this is a contaminate however given her symptoms it is reasonable for her to complete a 3 day course of bactrim for uncomplicated UTI. Her respiratory virus panel was completely negative.  She still does not have a cough today on my exam.  Lungs otherwise are CTA bilaterally.

## 2016-11-24 NOTE — Clinical Social Work Note (Signed)
CSW acknowledges consult "PT recs SNF placement. Pt does not meet inpatient need, stable for discharge medically. Please assist." RNCM following for home health needs. Patient continues to be Medicare observation and does not have a 3-day qualifying inpatient stay in the past 30 days.  CSW signing off. Consult again if any social work needs arise.  Dayton Scrape, [place]

## 2016-11-25 DIAGNOSIS — N390 Urinary tract infection, site not specified: Secondary | ICD-10-CM | POA: Diagnosis not present

## 2016-11-25 DIAGNOSIS — I110 Hypertensive heart disease with heart failure: Secondary | ICD-10-CM | POA: Diagnosis not present

## 2016-11-25 DIAGNOSIS — Z952 Presence of prosthetic heart valve: Secondary | ICD-10-CM | POA: Diagnosis not present

## 2016-11-25 DIAGNOSIS — I5032 Chronic diastolic (congestive) heart failure: Secondary | ICD-10-CM | POA: Diagnosis not present

## 2016-11-25 DIAGNOSIS — Z951 Presence of aortocoronary bypass graft: Secondary | ICD-10-CM | POA: Diagnosis not present

## 2016-11-25 DIAGNOSIS — F0390 Unspecified dementia without behavioral disturbance: Secondary | ICD-10-CM | POA: Diagnosis not present

## 2016-11-25 DIAGNOSIS — E782 Mixed hyperlipidemia: Secondary | ICD-10-CM | POA: Diagnosis not present

## 2016-11-25 DIAGNOSIS — I5042 Chronic combined systolic (congestive) and diastolic (congestive) heart failure: Secondary | ICD-10-CM | POA: Diagnosis not present

## 2016-11-25 DIAGNOSIS — J209 Acute bronchitis, unspecified: Secondary | ICD-10-CM | POA: Diagnosis not present

## 2016-11-25 DIAGNOSIS — D509 Iron deficiency anemia, unspecified: Secondary | ICD-10-CM | POA: Diagnosis not present

## 2016-11-25 DIAGNOSIS — K279 Peptic ulcer, site unspecified, unspecified as acute or chronic, without hemorrhage or perforation: Secondary | ICD-10-CM | POA: Diagnosis not present

## 2016-11-25 DIAGNOSIS — Z794 Long term (current) use of insulin: Secondary | ICD-10-CM | POA: Diagnosis not present

## 2016-11-25 DIAGNOSIS — M1990 Unspecified osteoarthritis, unspecified site: Secondary | ICD-10-CM | POA: Diagnosis not present

## 2016-11-25 DIAGNOSIS — Z7902 Long term (current) use of antithrombotics/antiplatelets: Secondary | ICD-10-CM | POA: Diagnosis not present

## 2016-11-25 DIAGNOSIS — I2510 Atherosclerotic heart disease of native coronary artery without angina pectoris: Secondary | ICD-10-CM | POA: Diagnosis not present

## 2016-11-25 DIAGNOSIS — E119 Type 2 diabetes mellitus without complications: Secondary | ICD-10-CM | POA: Diagnosis not present

## 2016-11-25 DIAGNOSIS — I10 Essential (primary) hypertension: Secondary | ICD-10-CM | POA: Diagnosis not present

## 2016-11-25 DIAGNOSIS — F411 Generalized anxiety disorder: Secondary | ICD-10-CM | POA: Diagnosis not present

## 2016-12-01 DIAGNOSIS — N390 Urinary tract infection, site not specified: Secondary | ICD-10-CM | POA: Diagnosis not present

## 2016-12-01 DIAGNOSIS — E119 Type 2 diabetes mellitus without complications: Secondary | ICD-10-CM | POA: Diagnosis not present

## 2016-12-01 DIAGNOSIS — I5032 Chronic diastolic (congestive) heart failure: Secondary | ICD-10-CM | POA: Diagnosis not present

## 2016-12-01 DIAGNOSIS — F0390 Unspecified dementia without behavioral disturbance: Secondary | ICD-10-CM | POA: Diagnosis not present

## 2016-12-01 DIAGNOSIS — J206 Acute bronchitis due to rhinovirus: Secondary | ICD-10-CM | POA: Diagnosis not present

## 2016-12-01 DIAGNOSIS — I110 Hypertensive heart disease with heart failure: Secondary | ICD-10-CM | POA: Diagnosis not present

## 2016-12-01 DIAGNOSIS — J209 Acute bronchitis, unspecified: Secondary | ICD-10-CM | POA: Diagnosis not present

## 2016-12-06 DIAGNOSIS — E119 Type 2 diabetes mellitus without complications: Secondary | ICD-10-CM | POA: Diagnosis not present

## 2016-12-06 DIAGNOSIS — N390 Urinary tract infection, site not specified: Secondary | ICD-10-CM | POA: Diagnosis not present

## 2016-12-06 DIAGNOSIS — J209 Acute bronchitis, unspecified: Secondary | ICD-10-CM | POA: Diagnosis not present

## 2016-12-06 DIAGNOSIS — I5032 Chronic diastolic (congestive) heart failure: Secondary | ICD-10-CM | POA: Diagnosis not present

## 2016-12-06 DIAGNOSIS — I110 Hypertensive heart disease with heart failure: Secondary | ICD-10-CM | POA: Diagnosis not present

## 2016-12-06 DIAGNOSIS — F0390 Unspecified dementia without behavioral disturbance: Secondary | ICD-10-CM | POA: Diagnosis not present

## 2016-12-07 DIAGNOSIS — I110 Hypertensive heart disease with heart failure: Secondary | ICD-10-CM | POA: Diagnosis not present

## 2016-12-07 DIAGNOSIS — E119 Type 2 diabetes mellitus without complications: Secondary | ICD-10-CM | POA: Diagnosis not present

## 2016-12-07 DIAGNOSIS — J209 Acute bronchitis, unspecified: Secondary | ICD-10-CM | POA: Diagnosis not present

## 2016-12-07 DIAGNOSIS — I5032 Chronic diastolic (congestive) heart failure: Secondary | ICD-10-CM | POA: Diagnosis not present

## 2016-12-07 DIAGNOSIS — N390 Urinary tract infection, site not specified: Secondary | ICD-10-CM | POA: Diagnosis not present

## 2016-12-07 DIAGNOSIS — F0390 Unspecified dementia without behavioral disturbance: Secondary | ICD-10-CM | POA: Diagnosis not present

## 2016-12-09 DIAGNOSIS — E119 Type 2 diabetes mellitus without complications: Secondary | ICD-10-CM | POA: Diagnosis not present

## 2016-12-09 DIAGNOSIS — I110 Hypertensive heart disease with heart failure: Secondary | ICD-10-CM | POA: Diagnosis not present

## 2016-12-09 DIAGNOSIS — N390 Urinary tract infection, site not specified: Secondary | ICD-10-CM | POA: Diagnosis not present

## 2016-12-09 DIAGNOSIS — F0390 Unspecified dementia without behavioral disturbance: Secondary | ICD-10-CM | POA: Diagnosis not present

## 2016-12-09 DIAGNOSIS — J209 Acute bronchitis, unspecified: Secondary | ICD-10-CM | POA: Diagnosis not present

## 2016-12-09 DIAGNOSIS — I5032 Chronic diastolic (congestive) heart failure: Secondary | ICD-10-CM | POA: Diagnosis not present

## 2016-12-12 DIAGNOSIS — N390 Urinary tract infection, site not specified: Secondary | ICD-10-CM | POA: Diagnosis not present

## 2016-12-12 DIAGNOSIS — F0390 Unspecified dementia without behavioral disturbance: Secondary | ICD-10-CM | POA: Diagnosis not present

## 2016-12-12 DIAGNOSIS — I110 Hypertensive heart disease with heart failure: Secondary | ICD-10-CM | POA: Diagnosis not present

## 2016-12-12 DIAGNOSIS — I5032 Chronic diastolic (congestive) heart failure: Secondary | ICD-10-CM | POA: Diagnosis not present

## 2016-12-12 DIAGNOSIS — E119 Type 2 diabetes mellitus without complications: Secondary | ICD-10-CM | POA: Diagnosis not present

## 2016-12-12 DIAGNOSIS — J209 Acute bronchitis, unspecified: Secondary | ICD-10-CM | POA: Diagnosis not present

## 2016-12-13 DIAGNOSIS — N390 Urinary tract infection, site not specified: Secondary | ICD-10-CM | POA: Diagnosis not present

## 2016-12-13 DIAGNOSIS — F0390 Unspecified dementia without behavioral disturbance: Secondary | ICD-10-CM | POA: Diagnosis not present

## 2016-12-13 DIAGNOSIS — I110 Hypertensive heart disease with heart failure: Secondary | ICD-10-CM | POA: Diagnosis not present

## 2016-12-13 DIAGNOSIS — I5032 Chronic diastolic (congestive) heart failure: Secondary | ICD-10-CM | POA: Diagnosis not present

## 2016-12-13 DIAGNOSIS — E119 Type 2 diabetes mellitus without complications: Secondary | ICD-10-CM | POA: Diagnosis not present

## 2016-12-13 DIAGNOSIS — J209 Acute bronchitis, unspecified: Secondary | ICD-10-CM | POA: Diagnosis not present

## 2016-12-15 DIAGNOSIS — I110 Hypertensive heart disease with heart failure: Secondary | ICD-10-CM | POA: Diagnosis not present

## 2016-12-15 DIAGNOSIS — N390 Urinary tract infection, site not specified: Secondary | ICD-10-CM | POA: Diagnosis not present

## 2016-12-15 DIAGNOSIS — F0390 Unspecified dementia without behavioral disturbance: Secondary | ICD-10-CM | POA: Diagnosis not present

## 2016-12-15 DIAGNOSIS — J209 Acute bronchitis, unspecified: Secondary | ICD-10-CM | POA: Diagnosis not present

## 2016-12-15 DIAGNOSIS — I5032 Chronic diastolic (congestive) heart failure: Secondary | ICD-10-CM | POA: Diagnosis not present

## 2016-12-15 DIAGNOSIS — E119 Type 2 diabetes mellitus without complications: Secondary | ICD-10-CM | POA: Diagnosis not present

## 2016-12-19 DIAGNOSIS — N390 Urinary tract infection, site not specified: Secondary | ICD-10-CM | POA: Diagnosis not present

## 2016-12-19 DIAGNOSIS — I110 Hypertensive heart disease with heart failure: Secondary | ICD-10-CM | POA: Diagnosis not present

## 2016-12-19 DIAGNOSIS — I5032 Chronic diastolic (congestive) heart failure: Secondary | ICD-10-CM | POA: Diagnosis not present

## 2016-12-19 DIAGNOSIS — J209 Acute bronchitis, unspecified: Secondary | ICD-10-CM | POA: Diagnosis not present

## 2016-12-19 DIAGNOSIS — F0390 Unspecified dementia without behavioral disturbance: Secondary | ICD-10-CM | POA: Diagnosis not present

## 2016-12-19 DIAGNOSIS — E119 Type 2 diabetes mellitus without complications: Secondary | ICD-10-CM | POA: Diagnosis not present

## 2016-12-20 DIAGNOSIS — J209 Acute bronchitis, unspecified: Secondary | ICD-10-CM | POA: Diagnosis not present

## 2016-12-20 DIAGNOSIS — I5032 Chronic diastolic (congestive) heart failure: Secondary | ICD-10-CM | POA: Diagnosis not present

## 2016-12-20 DIAGNOSIS — I110 Hypertensive heart disease with heart failure: Secondary | ICD-10-CM | POA: Diagnosis not present

## 2016-12-20 DIAGNOSIS — F0390 Unspecified dementia without behavioral disturbance: Secondary | ICD-10-CM | POA: Diagnosis not present

## 2016-12-20 DIAGNOSIS — N390 Urinary tract infection, site not specified: Secondary | ICD-10-CM | POA: Diagnosis not present

## 2016-12-20 DIAGNOSIS — E119 Type 2 diabetes mellitus without complications: Secondary | ICD-10-CM | POA: Diagnosis not present

## 2016-12-23 DIAGNOSIS — E119 Type 2 diabetes mellitus without complications: Secondary | ICD-10-CM | POA: Diagnosis not present

## 2016-12-23 DIAGNOSIS — N390 Urinary tract infection, site not specified: Secondary | ICD-10-CM | POA: Diagnosis not present

## 2016-12-23 DIAGNOSIS — I110 Hypertensive heart disease with heart failure: Secondary | ICD-10-CM | POA: Diagnosis not present

## 2016-12-23 DIAGNOSIS — I5032 Chronic diastolic (congestive) heart failure: Secondary | ICD-10-CM | POA: Diagnosis not present

## 2016-12-23 DIAGNOSIS — F0390 Unspecified dementia without behavioral disturbance: Secondary | ICD-10-CM | POA: Diagnosis not present

## 2016-12-23 DIAGNOSIS — J209 Acute bronchitis, unspecified: Secondary | ICD-10-CM | POA: Diagnosis not present

## 2016-12-26 DIAGNOSIS — J209 Acute bronchitis, unspecified: Secondary | ICD-10-CM | POA: Diagnosis not present

## 2016-12-26 DIAGNOSIS — I110 Hypertensive heart disease with heart failure: Secondary | ICD-10-CM | POA: Diagnosis not present

## 2016-12-26 DIAGNOSIS — F0390 Unspecified dementia without behavioral disturbance: Secondary | ICD-10-CM | POA: Diagnosis not present

## 2016-12-26 DIAGNOSIS — N390 Urinary tract infection, site not specified: Secondary | ICD-10-CM | POA: Diagnosis not present

## 2016-12-26 DIAGNOSIS — E119 Type 2 diabetes mellitus without complications: Secondary | ICD-10-CM | POA: Diagnosis not present

## 2016-12-26 DIAGNOSIS — I5032 Chronic diastolic (congestive) heart failure: Secondary | ICD-10-CM | POA: Diagnosis not present

## 2016-12-30 DIAGNOSIS — F0390 Unspecified dementia without behavioral disturbance: Secondary | ICD-10-CM | POA: Diagnosis not present

## 2016-12-30 DIAGNOSIS — E119 Type 2 diabetes mellitus without complications: Secondary | ICD-10-CM | POA: Diagnosis not present

## 2016-12-30 DIAGNOSIS — I110 Hypertensive heart disease with heart failure: Secondary | ICD-10-CM | POA: Diagnosis not present

## 2016-12-30 DIAGNOSIS — I5032 Chronic diastolic (congestive) heart failure: Secondary | ICD-10-CM | POA: Diagnosis not present

## 2016-12-30 DIAGNOSIS — J209 Acute bronchitis, unspecified: Secondary | ICD-10-CM | POA: Diagnosis not present

## 2016-12-30 DIAGNOSIS — N390 Urinary tract infection, site not specified: Secondary | ICD-10-CM | POA: Diagnosis not present

## 2017-01-04 DIAGNOSIS — I5032 Chronic diastolic (congestive) heart failure: Secondary | ICD-10-CM | POA: Diagnosis not present

## 2017-01-04 DIAGNOSIS — I110 Hypertensive heart disease with heart failure: Secondary | ICD-10-CM | POA: Diagnosis not present

## 2017-01-04 DIAGNOSIS — E119 Type 2 diabetes mellitus without complications: Secondary | ICD-10-CM | POA: Diagnosis not present

## 2017-01-04 DIAGNOSIS — J209 Acute bronchitis, unspecified: Secondary | ICD-10-CM | POA: Diagnosis not present

## 2017-01-04 DIAGNOSIS — N390 Urinary tract infection, site not specified: Secondary | ICD-10-CM | POA: Diagnosis not present

## 2017-01-04 DIAGNOSIS — F0390 Unspecified dementia without behavioral disturbance: Secondary | ICD-10-CM | POA: Diagnosis not present

## 2017-01-09 DIAGNOSIS — I110 Hypertensive heart disease with heart failure: Secondary | ICD-10-CM | POA: Diagnosis not present

## 2017-01-09 DIAGNOSIS — I5032 Chronic diastolic (congestive) heart failure: Secondary | ICD-10-CM | POA: Diagnosis not present

## 2017-01-09 DIAGNOSIS — E119 Type 2 diabetes mellitus without complications: Secondary | ICD-10-CM | POA: Diagnosis not present

## 2017-01-09 DIAGNOSIS — J209 Acute bronchitis, unspecified: Secondary | ICD-10-CM | POA: Diagnosis not present

## 2017-01-09 DIAGNOSIS — N390 Urinary tract infection, site not specified: Secondary | ICD-10-CM | POA: Diagnosis not present

## 2017-01-09 DIAGNOSIS — F0390 Unspecified dementia without behavioral disturbance: Secondary | ICD-10-CM | POA: Diagnosis not present

## 2017-01-20 DIAGNOSIS — I110 Hypertensive heart disease with heart failure: Secondary | ICD-10-CM | POA: Diagnosis not present

## 2017-01-20 DIAGNOSIS — I5032 Chronic diastolic (congestive) heart failure: Secondary | ICD-10-CM | POA: Diagnosis not present

## 2017-01-20 DIAGNOSIS — J209 Acute bronchitis, unspecified: Secondary | ICD-10-CM | POA: Diagnosis not present

## 2017-01-20 DIAGNOSIS — F0390 Unspecified dementia without behavioral disturbance: Secondary | ICD-10-CM | POA: Diagnosis not present

## 2017-01-20 DIAGNOSIS — N390 Urinary tract infection, site not specified: Secondary | ICD-10-CM | POA: Diagnosis not present

## 2017-01-20 DIAGNOSIS — E119 Type 2 diabetes mellitus without complications: Secondary | ICD-10-CM | POA: Diagnosis not present

## 2017-01-25 ENCOUNTER — Other Ambulatory Visit: Payer: Self-pay | Admitting: Internal Medicine

## 2017-02-01 ENCOUNTER — Encounter: Payer: Self-pay | Admitting: Physician Assistant

## 2017-02-01 ENCOUNTER — Ambulatory Visit (INDEPENDENT_AMBULATORY_CARE_PROVIDER_SITE_OTHER): Payer: Medicare Other | Admitting: Physician Assistant

## 2017-02-01 VITALS — Ht 62.0 in | Wt 216.0 lb

## 2017-02-01 DIAGNOSIS — I5032 Chronic diastolic (congestive) heart failure: Secondary | ICD-10-CM

## 2017-02-01 DIAGNOSIS — I10 Essential (primary) hypertension: Secondary | ICD-10-CM

## 2017-02-01 DIAGNOSIS — Z953 Presence of xenogenic heart valve: Secondary | ICD-10-CM | POA: Diagnosis not present

## 2017-02-01 DIAGNOSIS — I25810 Atherosclerosis of coronary artery bypass graft(s) without angina pectoris: Secondary | ICD-10-CM | POA: Diagnosis not present

## 2017-02-01 DIAGNOSIS — E785 Hyperlipidemia, unspecified: Secondary | ICD-10-CM

## 2017-02-01 DIAGNOSIS — E119 Type 2 diabetes mellitus without complications: Secondary | ICD-10-CM | POA: Diagnosis not present

## 2017-02-01 DIAGNOSIS — Z794 Long term (current) use of insulin: Secondary | ICD-10-CM | POA: Diagnosis not present

## 2017-02-01 DIAGNOSIS — IMO0001 Reserved for inherently not codable concepts without codable children: Secondary | ICD-10-CM

## 2017-02-01 NOTE — Progress Notes (Signed)
Cardiology Office Note    Date:  02/01/2017   ID:  Katherine [name], DOB December 21, 1927, MRN [id number]  PCP:  Wende Neighbors, MD  Cardiologist:  Dr. Debara Pickett Valvular specialist: Dr. Angelena Form  Chief Complaint  Patient presents with  . Follow-up    seen for Dr. Debara Pickett, routine followup    History of Present Illness:  Katherine [name] is a 83 y.o. female with PMH of CAD s/p 4v CABG in 2003, HTN, DM, HLD, chronic diastolic HF, obesity, and h/o severe AS s/p TAVR on 05/26/2015. Last echocardiogram obtained on 05/27/2016 showed EF 60-65%, stable bioprosthetic aortic valve with no significant regurgitation, mild MR, PA peak pressure 49 mmHg. she did have a low risk Myoview on 12/27/2013. She was admitted on 11/20/2016 for acute diastolic heart failure and flash pulmonary edema secondary to hypertension in the setting of missed antihypertensive medication. She was discharged on 11/21/2016 however came back to the hospital and was admitted again the following day with altered mental status and cough. She was treated for UTI and bronchitis.  She presents today for 6 month follow-up. Her UTI and bronchitis has resolved. She been doing well, she lived with her daughter and have a home [employer]. She denies any recent chest pain or shortness of breath. She does not walk outside very much, she is able to walk indoor with a walker to the bathroom. Otherwise, it sounds like she is largely sedentary. She does have very mild lower extremity edema, this is her usual level, and I do not think we need to treat at this time. Some of which may be related to venous insufficiency. She has only 1+ pitting edema on physical exam. Her lung is clear. I think from cardiology perspective, she is doing well.    Past Medical History:  Diagnosis Date  . Anemia   . Anxiety   . Aortic valvular stenosis    moderate to severe  EF on 2D Echo 10/22/2012 65-70%  valve area of 1.2cm, and peak and mean gradients of 45 and 103mmHg    . Chalazion of right upper eyelid early June 2016   treating with cream, Rx by Dr. Merlyn Albert    . CHF (congestive heart failure) ([place])   . Coronary artery disease   . Dementia   . Diabetes mellitus, type 2 ([place])   . Dyspnea    chronic  . Easy fatigability   . Gastric mass   . Gastric ulcer   . GERD (gastroesophageal reflux disease)   . Heart murmur   . Hernia of unspecified site of abdominal cavity without mention of obstruction or gangrene    hiatal  . Hyperlipidemia   . Hypertension   . Normal cardiac stress test 12/2013   normal stress nuclear study  . Osteoarthritis   . Thyroid mass     Past Surgical History:  Procedure Laterality Date  . AORTIC VALVE SURGERY    . CATARACT EXTRACTION Bilateral   . CORONARY ARTERY BYPASS GRAFT     x4 in 2003  . internal hemorrhoidectomy    . KNEE SURGERY Right   . LEFT AND RIGHT HEART CATHETERIZATION WITH CORONARY/GRAFT ANGIOGRAM N/A 03/24/2015   Procedure: LEFT AND RIGHT HEART CATHETERIZATION WITH Beatrix Fetters;  Surgeon: Burnell Blanks, MD;  Location: [facility] CATH LAB;  Service: Cardiovascular;  Laterality: N/A;  . TEE WITHOUT CARDIOVERSION N/A 05/26/2015   Procedure: TRANSESOPHAGEAL ECHOCARDIOGRAM (TEE);  Surgeon: Burnell Blanks, MD;  Location: [facility];  Service: Open  Heart Surgery;  Laterality: N/A;  . TRANSCATHETER AORTIC VALVE REPLACEMENT, TRANSFEMORAL N/A 05/26/2015   Procedure: TRANSCATHETER AORTIC VALVE REPLACEMENT, TRANSFEMORAL;  Surgeon: Burnell Blanks, MD;  Location: [facility];  Service: Open Heart Surgery;  Laterality: N/A;  . TUBAL LIGATION    . uterine polypectomy      Current Medications: Outpatient Medications Prior to Visit  Medication Sig Dispense Refill  . acetaminophen (TYLENOL) 500 MG tablet Take two tablets by mouth twice daily for pain. Do not exceed 4gm of Tylenol in 24 hours    . ALPRAZolam (XANAX) 0.25 MG tablet Take 0.5 tablets (0.125 mg total) by mouth 2 (two) times daily as needed for  anxiety or sleep. (Patient taking differently: Take 0.25 mg by mouth 3 (three) times daily. ) 10 tablet 0  . atorvastatin (LIPITOR) 80 MG tablet Take 80 mg by mouth daily.     . clopidogrel (PLAVIX) 75 MG tablet Take 1 tablet (75 mg total) by mouth daily with breakfast. 30 tablet 11  . fexofenadine (ALLEGRA) 180 MG tablet Take 180 mg by mouth daily.    . furosemide (LASIX) 20 MG tablet TAKE 2 TABLETS BY MOUTH DAILY 180 tablet 0  . guaiFENesin (MUCINEX) 600 MG 12 hr tablet Take 1 tablet (600 mg total) by mouth 2 (two) times daily. (Patient taking differently: Take 600 mg by mouth 2 (two) times daily as needed. For acute pulmonary edema. Do not crush.)    . hydrALAZINE (APRESOLINE) 50 MG tablet Take 1 tablet (50 mg total) by mouth every 8 (eight) hours. (Patient taking differently: Take 50 mg by mouth every 8 (eight) hours. For blood pressure) 90 tablet 11  . Ipratropium-Albuterol (COMBIVENT RESPIMAT) 20-100 MCG/ACT AERS respimat Inhale 2 puffs into the lungs every 6 (six) hours as needed for wheezing or shortness of breath. 1 Inhaler 2  . lactulose (CHRONULAC) 10 GM/15ML solution Take 45 mLs (30 g total) by mouth daily as needed for mild constipation or severe constipation. 240 mL 0  . Linaclotide (LINZESS) 145 MCG CAPS capsule Take 145 mcg by mouth daily as needed (for constipation).    Marland Kitchen lisinopril (PRINIVIL,ZESTRIL) 10 MG tablet Take 1 tablet (10 mg total) by mouth daily. 30 tablet 11  . loperamide (IMODIUM) 2 MG capsule Take 2 mg by mouth as needed for diarrhea or loose stools.    . meloxicam (MOBIC) 7.5 MG tablet Take 7.5 mg by mouth daily as needed for pain.     . Memantine HCl ER (NAMENDA XR) 28 MG CP24 Take one capsule by mouth every morning for dementia    . metFORMIN (GLUCOPHAGE-XR) 500 MG 24 hr tablet Take 500 mg by mouth daily with breakfast. To control blood sugar. Do not crush.    . nebivolol (BYSTOLIC) 10 MG tablet Take one tablet by mouth every morning for blood pressure    . NEXIUM 20  MG capsule Take one capsule by mouth once daily for reflux    . NYSTATIN powder Apply 1 application topically 2 (two) times daily as needed for for breast care.    . polyethylene glycol (MIRALAX / GLYCOLAX) packet Take 17 g by mouth daily. 14 each 0  . potassium chloride SA (K-DUR,KLOR-CON) 20 MEQ tablet Take 20 mEq by mouth daily.    . traMADol (ULTRAM) 50 MG tablet Take 1-2 tablets (50-100 mg total) by mouth every 4 (four) hours as needed for moderate pain. 20 tablet 0  . Vitamin D, Ergocalciferol, (DRISDOL) 50000 units CAPS capsule Take 1 capsule (  50,000 Units total) by mouth every 7 (seven) days. 8 capsule 0  . VOLTAREN 1 % GEL Apply 2 g topically daily as needed (pain).     . insulin detemir (LEVEMIR) 100 UNIT/ML injection Inject 0.3 mLs (30 Units total) into the skin 2 (two) times daily. (Patient taking differently: Inject 30 Units into the skin 2 (two) times daily with a meal. DEPENDING ON BGL) 10 mL 11   No facility-administered medications prior to visit.      Allergies:   Tape   Social History   Social History  . Marital status: Widowed    Spouse name: N/A  . Number of children: 5  . Years of education: N/A   Occupational History  . Health and safety inspector and CNA    Social History Main Topics  . Smoking status: Never Smoker  . Smokeless tobacco: Never Used  . Alcohol use No  . Drug use: No  . Sexual activity: Not Asked   Other Topics Concern  . None   Social History Narrative  . None     Family History:  The patient's family history includes Cerebral aneurysm in her mother; Diabetes in her mother; Heart attack in her brother; Heart disease in her mother; Stroke in her father and mother.   ROS:   Please see the history of present illness.    ROS All other systems reviewed and are negative.   PHYSICAL EXAM:   VS:  Ht 5\' 2"  (1.575 m)   Wt 216 lb (98 kg)   BMI 39.51 kg/m    GEN: Well nourished, well developed, in no acute distress  HEENT: normal  Neck: no JVD,  carotid bruits, or masses Cardiac: RRR; no rubs, or gallops. 1+ edema. 1/6 systolic murmur Respiratory:  clear to auscultation bilaterally, normal work of breathing GI: soft, nontender, nondistended, + BS MS: no deformity or atrophy  Skin: warm and dry, no rash Neuro:  Alert and Oriented x 3, Strength and sensation are intact Psych: euthymic mood, full affect  Wt Readings from Last 3 Encounters:  02/01/17 216 lb (98 kg)  11/24/16 204 lb 1.6 oz (92.6 kg)  11/21/16 208 lb 6.4 oz (94.5 kg)      Studies/Labs Reviewed:   EKG:  EKG is not ordered today.    Recent Labs: 11/22/2016: B Natriuretic Peptide 137.5 11/23/2016: ALT 17; BUN 28; Creatinine, Ser 1.12; Hemoglobin 11.2; Platelets 266; Potassium 3.2; Sodium 139   Lipid Panel No results found for: CHOL, TRIG, HDL, CHOLHDL, VLDL, LDLCALC, LDLDIRECT  Additional studies/ records that were reviewed today include:   Myoview 12/27/2013 Impression Exercise Capacity:  Lexiscan with no exercise. BP Response:  Normal blood pressure response. Clinical Symptoms:  No significant symptoms noted. ECG Impression:  No significant ECG changes with Lexiscan. Comparison with Prior Nuclear Study: No significant change from previous study  Overall Impression:  Normal stress nuclear study.  LV Wall Motion:  Non-gated due to ectopy.    Echo 05/27/2016 LV EF: 60% -   65%  - Left ventricle: The cavity size was normal. Wall thickness was   increased in a pattern of mild LVH. Systolic function was normal.   The estimated ejection fraction was in the range of 60% to 65%.   Doppler parameters are consistent with both elevated ventricular   end-diastolic filling pressure and elevated left atrial filling   pressure. - Aortic valve: Tissue bioprosthetic valve with stable systolic   gradients and trivial AR. There was trivial regurgitation. - Mitral valve:  Calcified annulus. Mildly thickened leaflets .   There was mild regurgitation. - Left  atrium: The atrium was moderately dilated. - Atrial septum: No defect or patent foramen ovale was identified. - Pulmonary arteries: PA peak pressure: 49 mm Hg (S).    ASSESSMENT:    1. Chronic diastolic heart failure ([place])   2. S/p TAVR (transcatheter aortic valve replacement), bioprosthetic   3. Coronary artery disease involving coronary bypass graft of native heart without angina pectoris   4. Essential hypertension   5. Hyperlipidemia, unspecified hyperlipidemia type   6. IDDM (insulin dependent diabetes mellitus) ([place])      PLAN:  In order of problems listed above:  1. Chronic diastolic heart failure: She has trace amount of edema in the lower extremity, this is likely normal for her. I recommended continue on her current dose of Lasix. If her weight increases by more than 3 pounds overnight or 5 pounds in a single week, she can contact our cardiology office for instruction to potassium take 20 mg extra Lasix as needed.  2. S/p TAVR: Followed by Dr. Angelena Form, she does have 1/6 heart murmur on physical exam, however previous echocardiogram in 2017 showed stable bioprosthetic aortic valve.  3. CAD s/p 4v CABG in 2003: No obvious angina  4. HTN: Blood pressure mildly elevated on initial arrival, however recheck blood pressure by myself shows her systolic blood pressure went down to the 130s. I will continue on the current blood pressure medication.   - She is on hydralazine, lisinopril and nebivolol  5. HLD: She is on Lipitor 80 mg daily. However I did not see any fasting lipid panel recorded under Epic system. I asked her if her PCP is managing the hyperlipidemia, she is not sure. We will request lipid panel from her PCPs office to verify.  6. IDDM: On Levemir at home.    Medication Adjustments/Labs and Tests Ordered: Current medicines are reviewed at length with the patient today.  Concerns regarding medicines are outlined above.  Medication changes, Labs and Tests ordered  today are listed in the Patient Instructions below. Patient Instructions  Medication Instructions:  Your physician recommends that you continue on your current medications as directed. Please refer to the Current Medication list given to you today.  Labwork: None    Testing/Procedures: None   Follow-Up: Your physician wants you to follow-up in:6 months with Dr Debara Pickett. You will receive a reminder letter in the mail two months in advance. If you don't receive a letter, please call our office to schedule the follow-up appointment.  Any Other Special Instructions Will Be Listed Below (If Applicable).  MONITOR WEIGHT AND IF PATIENT GAINS MORE THAN 3 LBS OVER NIGHT OR MORE THAN 5LBS IN ONE WEEK CONTACT OFFICE FOR FURTHER TREATMENT AND DOSING WITH LASIX.   If you need a refill on your cardiac medications before your next appointment, please call your pharmacy.     Katherine [name], [place]  02/01/2017 10:24 AM    [address], [place], [place]  [id number] Phone: [phone]; Fax: [phone]

## 2017-02-01 NOTE — Patient Instructions (Signed)
Medication Instructions:  Your physician recommends that you continue on your current medications as directed. Please refer to the Current Medication list given to you today.  Labwork: None    Testing/Procedures: None   Follow-Up: Your physician wants you to follow-up in:6 months with Dr Debara Pickett. You will receive a reminder letter in the mail two months in advance. If you don't receive a letter, please call our office to schedule the follow-up appointment.  Any Other Special Instructions Will Be Listed Below (If Applicable).  MONITOR WEIGHT AND IF PATIENT GAINS MORE THAN 3 LBS OVER NIGHT OR MORE THAN 5LBS IN ONE WEEK CONTACT OFFICE FOR FURTHER TREATMENT AND DOSING WITH LASIX.   If you need a refill on your cardiac medications before your next appointment, please call your pharmacy.

## 2017-02-14 ENCOUNTER — Ambulatory Visit: Payer: Medicare Other | Admitting: Podiatry

## 2017-02-27 DIAGNOSIS — D509 Iron deficiency anemia, unspecified: Secondary | ICD-10-CM | POA: Diagnosis not present

## 2017-02-27 DIAGNOSIS — I10 Essential (primary) hypertension: Secondary | ICD-10-CM | POA: Diagnosis not present

## 2017-02-27 DIAGNOSIS — E119 Type 2 diabetes mellitus without complications: Secondary | ICD-10-CM | POA: Diagnosis not present

## 2017-03-01 DIAGNOSIS — F0390 Unspecified dementia without behavioral disturbance: Secondary | ICD-10-CM | POA: Diagnosis not present

## 2017-03-01 DIAGNOSIS — F411 Generalized anxiety disorder: Secondary | ICD-10-CM | POA: Diagnosis not present

## 2017-03-01 DIAGNOSIS — I10 Essential (primary) hypertension: Secondary | ICD-10-CM | POA: Diagnosis not present

## 2017-03-01 DIAGNOSIS — N3942 Incontinence without sensory awareness: Secondary | ICD-10-CM | POA: Diagnosis not present

## 2017-03-01 DIAGNOSIS — E119 Type 2 diabetes mellitus without complications: Secondary | ICD-10-CM | POA: Diagnosis not present

## 2017-03-07 DIAGNOSIS — R599 Enlarged lymph nodes, unspecified: Secondary | ICD-10-CM | POA: Diagnosis not present

## 2017-03-07 DIAGNOSIS — B379 Candidiasis, unspecified: Secondary | ICD-10-CM | POA: Diagnosis not present

## 2017-03-08 ENCOUNTER — Encounter: Payer: Self-pay | Admitting: Podiatry

## 2017-03-08 ENCOUNTER — Ambulatory Visit (INDEPENDENT_AMBULATORY_CARE_PROVIDER_SITE_OTHER): Payer: Medicare Other | Admitting: Podiatry

## 2017-03-08 DIAGNOSIS — B351 Tinea unguium: Secondary | ICD-10-CM | POA: Diagnosis not present

## 2017-03-08 DIAGNOSIS — M79676 Pain in unspecified toe(s): Secondary | ICD-10-CM

## 2017-03-08 DIAGNOSIS — M2010 Hallux valgus (acquired), unspecified foot: Secondary | ICD-10-CM

## 2017-03-08 DIAGNOSIS — E1140 Type 2 diabetes mellitus with diabetic neuropathy, unspecified: Secondary | ICD-10-CM

## 2017-03-08 DIAGNOSIS — Z794 Long term (current) use of insulin: Secondary | ICD-10-CM

## 2017-03-08 DIAGNOSIS — E0851 Diabetes mellitus due to underlying condition with diabetic peripheral angiopathy without gangrene: Secondary | ICD-10-CM

## 2017-03-08 NOTE — Progress Notes (Signed)
Patient ID: [name], female   DOB: 1928-11-10, 83 y.o.   MRN: [id number] Complaint:  Visit Type: Patient returns to my office for continued preventative foot care services. Complaint: Patient states" my nails have grown long and thick and become painful to walk and wear shoes" Patient has been diagnosed with DM with no foot complications. The patient presents for preventative foot care services. No changes to ROS  Podiatric Exam: Vascular: dorsalis pedis and posterior tibial pulses are not  palpable bilateral due to foot swelling.. Capillary return is immediate. Temperature gradient is WNL. Skin turgor WNL  Sensorium: Diminished Semmes Weinstein monofilament test. Normal tactile sensation bilaterally. Nail Exam: Pt has thick disfigured discolored nails with subungual debris noted bilateral entire nail hallux through fifth toenails Ulcer Exam: There is no evidence of ulcer or pre-ulcerative changes or infection. Orthopedic Exam: Muscle tone and strength are WNL. No limitations in general ROM. No crepitus or effusions noted. Foot type and digits show no abnormalities. Bony prominences are unremarkable. HAV deformity B/LSkin: No Porokeratosis. No infection or ulcers  Diagnosis:  Onychomycosis, , Pain in right toe, pain in left toes Diabetes with neuropathy. HAV  B/L,  Angiopathy  Treatment & Plan Procedures and Treatment: Consent by patient was obtained for treatment procedures. The patient understood the discussion of treatment and procedures well. All questions were answered thoroughly reviewed. Debridement of mycotic and hypertrophic toenails, 1 through 5 bilateral and clearing of subungual debris. No ulceration, no infection noted.  Initiate diabetic shoe paperwork. Return Visit-Office Procedure: Patient instructed to return to the office for a follow up visit 3 months for continued evaluation and treatment.   Gardiner Barefoot DPM

## 2017-03-15 DIAGNOSIS — I10 Essential (primary) hypertension: Secondary | ICD-10-CM | POA: Diagnosis not present

## 2017-03-15 DIAGNOSIS — E119 Type 2 diabetes mellitus without complications: Secondary | ICD-10-CM | POA: Diagnosis not present

## 2017-03-15 DIAGNOSIS — D509 Iron deficiency anemia, unspecified: Secondary | ICD-10-CM | POA: Diagnosis not present

## 2017-03-15 DIAGNOSIS — E782 Mixed hyperlipidemia: Secondary | ICD-10-CM | POA: Diagnosis not present

## 2017-03-24 DIAGNOSIS — F411 Generalized anxiety disorder: Secondary | ICD-10-CM | POA: Diagnosis not present

## 2017-03-24 DIAGNOSIS — L989 Disorder of the skin and subcutaneous tissue, unspecified: Secondary | ICD-10-CM | POA: Diagnosis not present

## 2017-03-24 DIAGNOSIS — J309 Allergic rhinitis, unspecified: Secondary | ICD-10-CM | POA: Diagnosis not present

## 2017-04-18 ENCOUNTER — Other Ambulatory Visit: Payer: Medicare Other

## 2017-04-18 DIAGNOSIS — M2010 Hallux valgus (acquired), unspecified foot: Secondary | ICD-10-CM | POA: Diagnosis not present

## 2017-04-18 DIAGNOSIS — E1140 Type 2 diabetes mellitus with diabetic neuropathy, unspecified: Secondary | ICD-10-CM

## 2017-04-18 DIAGNOSIS — E1342 Other specified diabetes mellitus with diabetic polyneuropathy: Secondary | ICD-10-CM | POA: Diagnosis not present

## 2017-05-16 ENCOUNTER — Other Ambulatory Visit: Payer: Self-pay | Admitting: Internal Medicine

## 2017-05-31 DIAGNOSIS — I10 Essential (primary) hypertension: Secondary | ICD-10-CM | POA: Diagnosis not present

## 2017-05-31 DIAGNOSIS — E119 Type 2 diabetes mellitus without complications: Secondary | ICD-10-CM | POA: Diagnosis not present

## 2017-05-31 DIAGNOSIS — D509 Iron deficiency anemia, unspecified: Secondary | ICD-10-CM | POA: Diagnosis not present

## 2017-06-06 DIAGNOSIS — R600 Localized edema: Secondary | ICD-10-CM | POA: Diagnosis not present

## 2017-06-06 DIAGNOSIS — D509 Iron deficiency anemia, unspecified: Secondary | ICD-10-CM | POA: Diagnosis not present

## 2017-06-06 DIAGNOSIS — I700 Atherosclerosis of aorta: Secondary | ICD-10-CM | POA: Diagnosis not present

## 2017-06-06 DIAGNOSIS — I5042 Chronic combined systolic (congestive) and diastolic (congestive) heart failure: Secondary | ICD-10-CM | POA: Diagnosis not present

## 2017-06-06 DIAGNOSIS — F0390 Unspecified dementia without behavioral disturbance: Secondary | ICD-10-CM | POA: Diagnosis not present

## 2017-06-06 DIAGNOSIS — E782 Mixed hyperlipidemia: Secondary | ICD-10-CM | POA: Diagnosis not present

## 2017-06-06 DIAGNOSIS — E119 Type 2 diabetes mellitus without complications: Secondary | ICD-10-CM | POA: Diagnosis not present

## 2017-06-06 DIAGNOSIS — R32 Unspecified urinary incontinence: Secondary | ICD-10-CM | POA: Diagnosis not present

## 2017-06-06 DIAGNOSIS — I10 Essential (primary) hypertension: Secondary | ICD-10-CM | POA: Diagnosis not present

## 2017-06-06 DIAGNOSIS — E870 Hyperosmolality and hypernatremia: Secondary | ICD-10-CM | POA: Diagnosis not present

## 2017-06-06 DIAGNOSIS — N2889 Other specified disorders of kidney and ureter: Secondary | ICD-10-CM | POA: Diagnosis not present

## 2017-08-04 ENCOUNTER — Encounter: Payer: Self-pay | Admitting: Internal Medicine

## 2017-08-04 ENCOUNTER — Ambulatory Visit (INDEPENDENT_AMBULATORY_CARE_PROVIDER_SITE_OTHER): Payer: Medicare Other | Admitting: Internal Medicine

## 2017-08-04 VITALS — BP 180/63 | HR 65 | Ht 62.0 in | Wt 207.4 lb

## 2017-08-04 DIAGNOSIS — I25810 Atherosclerosis of coronary artery bypass graft(s) without angina pectoris: Secondary | ICD-10-CM | POA: Diagnosis not present

## 2017-08-04 DIAGNOSIS — I10 Essential (primary) hypertension: Secondary | ICD-10-CM

## 2017-08-04 DIAGNOSIS — I5032 Chronic diastolic (congestive) heart failure: Secondary | ICD-10-CM | POA: Diagnosis not present

## 2017-08-04 DIAGNOSIS — Z953 Presence of xenogenic heart valve: Secondary | ICD-10-CM

## 2017-08-04 DIAGNOSIS — Z951 Presence of aortocoronary bypass graft: Secondary | ICD-10-CM

## 2017-08-04 NOTE — Patient Instructions (Signed)
Your physician wants you to follow-up in: 1 year with Dr. Hilty. You will receive a reminder letter in the mail two months in advance. If you don't receive a letter, please call our office to schedule the follow-up appointment.  

## 2017-08-04 NOTE — Progress Notes (Signed)
OFFICE NOTE  Chief Complaint:  Follow-up TAVR  Primary Care Physician: Celene Squibb, MD  HPI:  Katherine [name] is an 82 year old female with a history of coronary disease status post CABG in 2003, morbid obesity, hypertension, dyslipidemia, and diabetes type 2. She had valvular aortic stenosis with a sign of increased gradient on her last echo in November. We repeated that echo on October 22, 2012 which showed actually no significant change in her aortic valve gradient with a valve area of about 1.2 cm, and peak and mean gradients of 45 and 24 mmHg. This is similar to findings in 2012. I suspect that we may have had an incomplete envelop and that her true gradients are closer to 50 and 30 mmHg. However, this would still yield moderate to severe aortic stenosis. Overall though she is asymptomatic at this point and there is no clear recommendation for surgery or procedures at this time. He reports that recently she had a urinary tract infection which is associated with some confusion. Ultimately this is treated and she rebounded quite quickly. She denies any chest pain or worsening shortness of breath. She has had no syncopal episodes. She is stated in the past but she is not interested in a repeat open heart surgical procedure, nor do I think she is a good candidate for that.  She returns today for followup with a repeat echo. This shows fairly stable and gradients and again moderate to severe aortic stenosis with a valve area of about 1-1.1 cm. She has reported a slight increase in lower extremity swelling but also for the past 2 weeks has awakened in the morning with chest pain. The chest discomfort is located over the left anterior chest wall and is somewhat worse with movement and position. It is not necessarily associated with exertion, however she no longer has to walk up stairs as she moved her washing machine and dryer up to the first floor. Her last stress test was in 2011 and was  nonischemic.  Katherine [name] returns today for follow-up. She was recently seen by Cecilie Kicks, FNP, for lower extremity swelling and shortness of breath. She had her Lasix dose increased to 40 mg daily. A chest x-ray was performed which shows cardiomegaly without overt heart failure. BNP was elevated at 655.  A repeat echocardiogram was performed which shows an EF of 65-70%. There is now moderate to more likely severe aortic stenosis with a mean gradient of 34 mmHg and a peak gradient of 59 mmHg. She reports today that there is been mild improvement in her shortness of breath however her weight remains fairly stable. Her leg edema has improved somewhat.  Katherine [name] returns today for follow-up. She reports her swelling is improved significantly. Unfortunate she's developed a chest cold. She was advised to use Mucinex to loosen it up. She has had aggressive shortness of breath and easy fatigability. As mentioned above she is close to severe aortic stenosis. We discussed several options including TAVR today, which I think would be the best alternative for her. I would like to repeat her echocardiogram in 5-6 months and see her back at that time. If her mean gradient has increased some, I will refer her to the valve clinic.   I had the pleasure seeing Katherine [name] back in the office today for follow-up. She was supposed to have an echocardiogram which we were going to review, however that has not yet been performed. She reports stable breathlessness but no chest  pain. She's had no syncope. Unfortunately she's gained more weight.  I saw Katherine [name] back in the office today. She recently underwent TAVR replacement of her aortic valve. She seems to be doing much better from a cardiovascular standpoint. She's currently on Lasix 40 mg twice a day due to some lower extremity edema. She seemed to have a very protracted recovery and was a candidate place for about 2 months. Overall though she is breathing better and  seems to have a little more stamina. As previously mentioned her aortic valve gradient is very low and post procedure echo shows normal function.  08/04/2017  Katherine [name] was seen today in follow-up. She had successful TAVR surgery. Since surgery she is denied any worsening shortness of breath or chest pain. Repeat serial echocardiograms indicate a low transvalvular gradient. She remains on clopidogrel. She unfortunately has not managed to lose much weight. Blood pressure has been well controlled. EKG shows no ischemic changes.  PMHx:  Past Medical History:  Diagnosis Date  . Anemia   . Anxiety   . Aortic valvular stenosis    moderate to severe  EF on 2D Echo 10/22/2012 65-70%  valve area of 1.2cm, and peak and mean gradients of 45 and 21mmHg  . Chalazion of right upper eyelid early June 2016   treating with cream, Rx by Dr. Merlyn Albert    . CHF (congestive heart failure) ([place])   . Coronary artery disease   . Dementia   . Diabetes mellitus, type 2 ([place])   . Dyspnea    chronic  . Easy fatigability   . Gastric mass   . Gastric ulcer   . GERD (gastroesophageal reflux disease)   . Heart murmur   . Hernia of unspecified site of abdominal cavity without mention of obstruction or gangrene    hiatal  . Hyperlipidemia   . Hypertension   . Normal cardiac stress test 12/2013   normal stress nuclear study  . Osteoarthritis   . Thyroid mass     Past Surgical History:  Procedure Laterality Date  . AORTIC VALVE SURGERY    . CATARACT EXTRACTION Bilateral   . CORONARY ARTERY BYPASS GRAFT     x4 in 2003  . internal hemorrhoidectomy    . KNEE SURGERY Right   . LEFT AND RIGHT HEART CATHETERIZATION WITH CORONARY/GRAFT ANGIOGRAM N/A 03/24/2015   Procedure: LEFT AND RIGHT HEART CATHETERIZATION WITH Beatrix Fetters;  Surgeon: Burnell Blanks, MD;  Location: [facility] CATH LAB;  Service: Cardiovascular;  Laterality: N/A;  . TEE WITHOUT CARDIOVERSION N/A 05/26/2015   Procedure:  TRANSESOPHAGEAL ECHOCARDIOGRAM (TEE);  Surgeon: Burnell Blanks, MD;  Location: [facility];  Service: Open Heart Surgery;  Laterality: N/A;  . TRANSCATHETER AORTIC VALVE REPLACEMENT, TRANSFEMORAL N/A 05/26/2015   Procedure: TRANSCATHETER AORTIC VALVE REPLACEMENT, TRANSFEMORAL;  Surgeon: Burnell Blanks, MD;  Location: [facility];  Service: Open Heart Surgery;  Laterality: N/A;  . TUBAL LIGATION    . uterine polypectomy      FAMHx:  Family History  Problem Relation Age of Onset  . Diabetes Mother   . Heart disease Mother   . Stroke Mother   . Cerebral aneurysm Mother   . Stroke Father   . Heart attack Brother   . Colon cancer Neg Hx     SOCHx:   reports that she has never smoked. She has never used smokeless tobacco. She reports that she does not drink alcohol or use drugs.  ALLERGIES:  Allergies  Allergen Reactions  .  Tape Other (See Comments)    SKIN IS VERY THIN AND WILL TEAR EASILY!!    ROS: Pertinent items noted in HPI and remainder of comprehensive ROS otherwise negative.  HOME MEDS: Current Outpatient Prescriptions  Medication Sig Dispense Refill  . acetaminophen (TYLENOL) 500 MG tablet Take two tablets by mouth twice daily for pain. Do not exceed 4gm of Tylenol in 24 hours    . ALPRAZolam (XANAX) 0.25 MG tablet Take 0.5 tablets (0.125 mg total) by mouth 2 (two) times daily as needed for anxiety or sleep. (Patient taking differently: Take 0.25 mg by mouth 3 (three) times daily. ) 10 tablet 0  . atorvastatin (LIPITOR) 80 MG tablet Take 80 mg by mouth daily.     . clopidogrel (PLAVIX) 75 MG tablet Take 1 tablet (75 mg total) by mouth daily with breakfast. 30 tablet 11  . fexofenadine (ALLEGRA) 180 MG tablet Take 180 mg by mouth daily.    . furosemide (LASIX) 20 MG tablet TAKE 2 TABLETS BY MOUTH DAILY 180 tablet 3  . guaiFENesin (MUCINEX) 600 MG 12 hr tablet Take 1 tablet (600 mg total) by mouth 2 (two) times daily. (Patient taking differently: Take 600 mg by mouth 2  (two) times daily as needed. For acute pulmonary edema. Do not crush.)    . hydrALAZINE (APRESOLINE) 50 MG tablet Take 1 tablet (50 mg total) by mouth every 8 (eight) hours. (Patient taking differently: Take 50 mg by mouth every 8 (eight) hours. For blood pressure) 90 tablet 11  . Ipratropium-Albuterol (COMBIVENT RESPIMAT) 20-100 MCG/ACT AERS respimat Inhale 2 puffs into the lungs every 6 (six) hours as needed for wheezing or shortness of breath. 1 Inhaler 2  . lactulose (CHRONULAC) 10 GM/15ML solution Take 45 mLs (30 g total) by mouth daily as needed for mild constipation or severe constipation. 240 mL 0  . LEVEMIR FLEXTOUCH 100 UNIT/ML Pen Use as directed    . Linaclotide (LINZESS) 145 MCG CAPS capsule Take 145 mcg by mouth daily as needed (for constipation).    Marland Kitchen lisinopril (PRINIVIL,ZESTRIL) 10 MG tablet Take 1 tablet (10 mg total) by mouth daily. 30 tablet 11  . loperamide (IMODIUM) 2 MG capsule Take 2 mg by mouth as needed for diarrhea or loose stools.    . meloxicam (MOBIC) 7.5 MG tablet Take 7.5 mg by mouth daily as needed for pain.     . Memantine HCl ER (NAMENDA XR) 28 MG CP24 Take one capsule by mouth every morning for dementia    . metFORMIN (GLUCOPHAGE-XR) 500 MG 24 hr tablet Take 500 mg by mouth daily with breakfast. To control blood sugar. Do not crush.    . nebivolol (BYSTOLIC) 10 MG tablet Take one tablet by mouth every morning for blood pressure    . NEXIUM 20 MG capsule Take one capsule by mouth once daily for reflux    . NYSTATIN powder Apply 1 application topically 2 (two) times daily as needed for for breast care.    . potassium chloride SA (K-DUR,KLOR-CON) 20 MEQ tablet Take 20 mEq by mouth daily.    . traMADol (ULTRAM) 50 MG tablet Take 1-2 tablets (50-100 mg total) by mouth every 4 (four) hours as needed for moderate pain. 20 tablet 0  . Vitamin D, Ergocalciferol, (DRISDOL) 50000 units CAPS capsule Take 1 capsule (50,000 Units total) by mouth every 7 (seven) days. 8 capsule 0   . VOLTAREN 1 % GEL Apply 2 g topically daily as needed (pain).  No current facility-administered medications for this visit.     LABS/IMAGING: No results found for this or any previous visit (from the past 48 hour(s)). No results found.  VITALS: BP (!) 180/63   Pulse 65   Ht 5\' 2"  (1.575 m)   Wt 207 lb 6.4 oz (94.1 kg)   BMI 37.93 kg/m   EXAM: General appearance: alert, no distress, moderately obese and in wheelchair Neck: no carotid bruit, no JVD and thyroid not enlarged, symmetric, no tenderness/mass/nodules Lungs: clear to auscultation bilaterally Heart: regular rate and rhythm, S1, S2 normal and systolic murmur: early systolic 2/6, crescendo at 2nd right intercostal space Abdomen: soft, non-tender; bowel sounds normal; no masses,  no organomegaly Extremities: extremities normal, atraumatic, no cyanosis or edema Pulses: 2+ and symmetric Skin: Skin color, texture, turgor normal. No rashes or lesions Neurologic: Grossly normal Psych: Pleasant  EKG: Normal sinus rhythm at 65, voltage criteria for LVH-personally reviewed  ASSESSMENT: 1. Chest pain - resolved, low risk NST 01/2014 2. Severe aortic stenosis - s/p TAVR (06/2015) 3. Morbid obesity 4. History of coronary artery disease status post CABG in 2003 5. Hypertension 6. Dyslipidemia 7. Diabetes type 2  PLAN: 1.   Katherine [name] continues to do well after TAVR 2 years ago. LVEF is stable. Her valve gradients of been stable. Blood pressure is fairly well-controlled. A repeat blood pressure today did come down. She is on appropriate cholesterol medication and her diabetes is followed by her primary care provider. She denies any anginal symptoms. Plan follow-up with me annually or sooner as necessary.  [name], MD, [facility] Attending Cardiologist [place] 08/04/2017, 5:32 PM

## 2017-08-09 ENCOUNTER — Encounter: Payer: Self-pay | Admitting: Podiatry

## 2017-08-09 ENCOUNTER — Ambulatory Visit: Payer: Medicare Other | Admitting: Podiatry

## 2017-08-09 ENCOUNTER — Ambulatory Visit (INDEPENDENT_AMBULATORY_CARE_PROVIDER_SITE_OTHER): Payer: Medicare Other | Admitting: Podiatry

## 2017-08-09 VITALS — BP 159/78 | HR 72

## 2017-08-09 DIAGNOSIS — E1140 Type 2 diabetes mellitus with diabetic neuropathy, unspecified: Secondary | ICD-10-CM

## 2017-08-09 DIAGNOSIS — B351 Tinea unguium: Secondary | ICD-10-CM | POA: Diagnosis not present

## 2017-08-09 DIAGNOSIS — M79676 Pain in unspecified toe(s): Secondary | ICD-10-CM

## 2017-08-09 NOTE — Progress Notes (Signed)
Patient ID: Katherine [name], female   DOB: Apr 15, 1928, 83 y.o.   MRN: [id number] Complaint:  Visit Type: Patient returns to my office for continued preventative foot care services. Complaint: Patient states" my nails have grown long and thick and become painful to walk and wear shoes" Patient has been diagnosed with DM with no foot complications. The patient presents for preventative foot care services. No changes to ROS  Podiatric Exam: Vascular: dorsalis pedis and posterior tibial pulses are not  palpable bilateral due to foot swelling.. Capillary return is immediate. Temperature gradient is WNL. Skin turgor WNL  Sensorium: Diminished Semmes Weinstein monofilament test. Normal tactile sensation bilaterally. Nail Exam: Pt has thick disfigured discolored nails with subungual debris noted bilateral entire nail hallux through fifth toenails Ulcer Exam: There is no evidence of ulcer or pre-ulcerative changes or infection. Orthopedic Exam: Muscle tone and strength are WNL. No limitations in general ROM. No crepitus or effusions noted. Foot type and digits show no abnormalities. Bony prominences are unremarkable. HAV deformity B/L Skin: No Porokeratosis. No infection or ulcers  Diagnosis:  Onychomycosis, , Pain in right toe, pain in left toes Diabetes with neuropathy. HAV  B/L,  Angiopathy  Treatment & Plan Procedures and Treatment: Consent by patient was obtained for treatment procedures. The patient understood the discussion of treatment and procedures well. All questions were answered thoroughly reviewed. Debridement of mycotic and hypertrophic toenails, 1 through 5 bilateral and clearing of subungual debris. No ulceration, no infection noted.   Return Visit-Office Procedure: Patient instructed to return to the office for a follow up visit 3 months for continued evaluation and treatment.   Gardiner Barefoot DPM

## 2017-08-15 DIAGNOSIS — D3001 Benign neoplasm of right kidney: Secondary | ICD-10-CM | POA: Diagnosis not present

## 2017-08-15 DIAGNOSIS — D3000 Benign neoplasm of unspecified kidney: Secondary | ICD-10-CM | POA: Diagnosis not present

## 2017-08-15 DIAGNOSIS — N2889 Other specified disorders of kidney and ureter: Secondary | ICD-10-CM | POA: Diagnosis not present

## 2017-09-05 DIAGNOSIS — I10 Essential (primary) hypertension: Secondary | ICD-10-CM | POA: Diagnosis not present

## 2017-09-05 DIAGNOSIS — E119 Type 2 diabetes mellitus without complications: Secondary | ICD-10-CM | POA: Diagnosis not present

## 2017-09-05 DIAGNOSIS — D509 Iron deficiency anemia, unspecified: Secondary | ICD-10-CM | POA: Diagnosis not present

## 2017-09-07 DIAGNOSIS — D485 Neoplasm of uncertain behavior of skin: Secondary | ICD-10-CM | POA: Diagnosis not present

## 2017-09-07 DIAGNOSIS — R3981 Functional urinary incontinence: Secondary | ICD-10-CM | POA: Diagnosis not present

## 2017-09-07 DIAGNOSIS — I5042 Chronic combined systolic (congestive) and diastolic (congestive) heart failure: Secondary | ICD-10-CM | POA: Diagnosis not present

## 2017-09-07 DIAGNOSIS — K5904 Chronic idiopathic constipation: Secondary | ICD-10-CM | POA: Diagnosis not present

## 2017-09-07 DIAGNOSIS — E870 Hyperosmolality and hypernatremia: Secondary | ICD-10-CM | POA: Diagnosis not present

## 2017-09-07 DIAGNOSIS — D509 Iron deficiency anemia, unspecified: Secondary | ICD-10-CM | POA: Diagnosis not present

## 2017-09-07 DIAGNOSIS — I10 Essential (primary) hypertension: Secondary | ICD-10-CM | POA: Diagnosis not present

## 2017-09-07 DIAGNOSIS — E875 Hyperkalemia: Secondary | ICD-10-CM | POA: Diagnosis not present

## 2017-09-07 DIAGNOSIS — Z23 Encounter for immunization: Secondary | ICD-10-CM | POA: Diagnosis not present

## 2017-09-07 DIAGNOSIS — B372 Candidiasis of skin and nail: Secondary | ICD-10-CM | POA: Diagnosis not present

## 2017-09-07 DIAGNOSIS — M25569 Pain in unspecified knee: Secondary | ICD-10-CM | POA: Diagnosis not present

## 2017-09-07 DIAGNOSIS — N2889 Other specified disorders of kidney and ureter: Secondary | ICD-10-CM | POA: Diagnosis not present

## 2017-09-07 DIAGNOSIS — E119 Type 2 diabetes mellitus without complications: Secondary | ICD-10-CM | POA: Diagnosis not present

## 2017-11-10 ENCOUNTER — Ambulatory Visit: Payer: Medicare Other | Admitting: Podiatry

## 2017-11-29 ENCOUNTER — Ambulatory Visit (INDEPENDENT_AMBULATORY_CARE_PROVIDER_SITE_OTHER): Payer: Medicare Other | Admitting: Podiatry

## 2017-11-29 DIAGNOSIS — M79676 Pain in unspecified toe(s): Secondary | ICD-10-CM

## 2017-11-29 DIAGNOSIS — B351 Tinea unguium: Secondary | ICD-10-CM

## 2017-11-29 DIAGNOSIS — D689 Coagulation defect, unspecified: Secondary | ICD-10-CM

## 2017-11-29 DIAGNOSIS — E1140 Type 2 diabetes mellitus with diabetic neuropathy, unspecified: Secondary | ICD-10-CM

## 2017-11-29 NOTE — Progress Notes (Addendum)
Patient ID: Katherine [name], female   DOB: [birth year], 83 y.o.   MRN: [id number] Complaint:  Visit Type: Patient returns to my office for continued preventative foot care services. Complaint: Patient states" my nails have grown long and thick and become painful to walk and wear shoes" Patient has been diagnosed with DM with no foot complications. The patient presents for preventative foot care services. No changes to ROS.  Patient is taking plavix.  Podiatric Exam: Vascular: dorsalis pedis and posterior tibial pulses are not  palpable bilateral due to foot swelling.. Capillary return is immediate. Temperature gradient is WNL. Skin turgor WNL  Sensorium: Diminished Semmes Weinstein monofilament test. Normal tactile sensation bilaterally. Nail Exam: Pt has thick disfigured discolored nails with subungual debris noted bilateral entire nail hallux through fifth toenails Ulcer Exam: There is no evidence of ulcer or pre-ulcerative changes or infection. Orthopedic Exam: Muscle tone and strength are WNL. No limitations in general ROM. No crepitus or effusions noted. Foot type and digits show no abnormalities. Bony prominences are unremarkable. HAV deformity B/L Skin: No Porokeratosis. No infection or ulcers  Diagnosis:  Onychomycosis, , Pain in right toe, pain in left toes Diabetes with neuropathy. HAV  B/L,  Angiopathy  Treatment & Plan Procedures and Treatment: Consent by patient was obtained for treatment procedures. The patient understood the discussion of treatment and procedures well. All questions were answered thoroughly reviewed. Debridement of mycotic and hypertrophic toenails, 1 through 5 bilateral and clearing of subungual debris. No ulceration, no infection noted.  ABN signed for 2018. Return Visit-Office Procedure: Patient instructed to return to the office for a follow up visit 3 months for continued evaluation and treatment.   Gardiner Barefoot DPM

## 2017-12-13 DIAGNOSIS — E119 Type 2 diabetes mellitus without complications: Secondary | ICD-10-CM | POA: Diagnosis not present

## 2017-12-13 DIAGNOSIS — I10 Essential (primary) hypertension: Secondary | ICD-10-CM | POA: Diagnosis not present

## 2017-12-13 DIAGNOSIS — D509 Iron deficiency anemia, unspecified: Secondary | ICD-10-CM | POA: Diagnosis not present

## 2017-12-13 DIAGNOSIS — E782 Mixed hyperlipidemia: Secondary | ICD-10-CM | POA: Diagnosis not present

## 2017-12-15 DIAGNOSIS — N2889 Other specified disorders of kidney and ureter: Secondary | ICD-10-CM | POA: Diagnosis not present

## 2017-12-15 DIAGNOSIS — N393 Stress incontinence (female) (male): Secondary | ICD-10-CM | POA: Diagnosis not present

## 2017-12-15 DIAGNOSIS — E119 Type 2 diabetes mellitus without complications: Secondary | ICD-10-CM | POA: Diagnosis not present

## 2017-12-15 DIAGNOSIS — I10 Essential (primary) hypertension: Secondary | ICD-10-CM | POA: Diagnosis not present

## 2017-12-15 DIAGNOSIS — I5042 Chronic combined systolic (congestive) and diastolic (congestive) heart failure: Secondary | ICD-10-CM | POA: Diagnosis not present

## 2017-12-15 DIAGNOSIS — D509 Iron deficiency anemia, unspecified: Secondary | ICD-10-CM | POA: Diagnosis not present

## 2017-12-15 DIAGNOSIS — F0390 Unspecified dementia without behavioral disturbance: Secondary | ICD-10-CM | POA: Diagnosis not present

## 2017-12-15 DIAGNOSIS — R600 Localized edema: Secondary | ICD-10-CM | POA: Diagnosis not present

## 2017-12-15 DIAGNOSIS — E870 Hyperosmolality and hypernatremia: Secondary | ICD-10-CM | POA: Diagnosis not present

## 2017-12-15 DIAGNOSIS — G4700 Insomnia, unspecified: Secondary | ICD-10-CM | POA: Diagnosis not present

## 2017-12-15 DIAGNOSIS — M25569 Pain in unspecified knee: Secondary | ICD-10-CM | POA: Diagnosis not present

## 2018-03-02 ENCOUNTER — Ambulatory Visit: Payer: Medicare Other | Admitting: Podiatry

## 2018-03-05 DIAGNOSIS — I5042 Chronic combined systolic (congestive) and diastolic (congestive) heart failure: Secondary | ICD-10-CM | POA: Diagnosis not present

## 2018-03-05 DIAGNOSIS — D509 Iron deficiency anemia, unspecified: Secondary | ICD-10-CM | POA: Diagnosis not present

## 2018-03-05 DIAGNOSIS — E782 Mixed hyperlipidemia: Secondary | ICD-10-CM | POA: Diagnosis not present

## 2018-03-05 DIAGNOSIS — N2889 Other specified disorders of kidney and ureter: Secondary | ICD-10-CM | POA: Diagnosis not present

## 2018-03-05 DIAGNOSIS — E870 Hyperosmolality and hypernatremia: Secondary | ICD-10-CM | POA: Diagnosis not present

## 2018-03-05 DIAGNOSIS — R600 Localized edema: Secondary | ICD-10-CM | POA: Diagnosis not present

## 2018-03-05 DIAGNOSIS — I700 Atherosclerosis of aorta: Secondary | ICD-10-CM | POA: Diagnosis not present

## 2018-03-05 DIAGNOSIS — I10 Essential (primary) hypertension: Secondary | ICD-10-CM | POA: Diagnosis not present

## 2018-03-05 DIAGNOSIS — E119 Type 2 diabetes mellitus without complications: Secondary | ICD-10-CM | POA: Diagnosis not present

## 2018-03-05 DIAGNOSIS — F411 Generalized anxiety disorder: Secondary | ICD-10-CM | POA: Diagnosis not present

## 2018-03-05 DIAGNOSIS — F0390 Unspecified dementia without behavioral disturbance: Secondary | ICD-10-CM | POA: Diagnosis not present

## 2018-03-07 ENCOUNTER — Encounter: Payer: Self-pay | Admitting: Podiatry

## 2018-03-07 ENCOUNTER — Ambulatory Visit (INDEPENDENT_AMBULATORY_CARE_PROVIDER_SITE_OTHER): Payer: Medicare Other | Admitting: Podiatry

## 2018-03-07 DIAGNOSIS — E1140 Type 2 diabetes mellitus with diabetic neuropathy, unspecified: Secondary | ICD-10-CM

## 2018-03-07 DIAGNOSIS — M79674 Pain in right toe(s): Secondary | ICD-10-CM

## 2018-03-07 DIAGNOSIS — D689 Coagulation defect, unspecified: Secondary | ICD-10-CM

## 2018-03-07 DIAGNOSIS — B351 Tinea unguium: Secondary | ICD-10-CM | POA: Diagnosis not present

## 2018-03-07 DIAGNOSIS — M79675 Pain in left toe(s): Secondary | ICD-10-CM | POA: Diagnosis not present

## 2018-03-07 NOTE — Progress Notes (Signed)
Patient ID: Katherine [name], female   DOB: 05/31/1928, 84 y.o.   MRN: [id number] Complaint:  Visit Type: Patient returns to my office for continued preventative foot care services. Complaint: Patient states" my nails have grown long and thick and become painful to walk and wear shoes" Patient has been diagnosed with DM with no foot complications. The patient presents for preventative foot care services. No changes to ROS.  Patient is taking plavix.  Podiatric Exam: Vascular: dorsalis pedis and posterior tibial pulses are not  palpable bilateral due to foot swelling.. Capillary return is immediate. Temperature gradient is WNL. Skin turgor WNL  Sensorium: Diminished Semmes Weinstein monofilament test. Normal tactile sensation bilaterally. Nail Exam: Pt has thick disfigured discolored nails with subungual debris noted bilateral entire nail hallux through fifth toenails Ulcer Exam: There is no evidence of ulcer or pre-ulcerative changes or infection. Orthopedic Exam: Muscle tone and strength are WNL. No limitations in general ROM. No crepitus or effusions noted. Foot type and digits show no abnormalities. Bony prominences are unremarkable. HAV deformity B/L Skin: No Porokeratosis. No infection or ulcers  Diagnosis:  Onychomycosis, , Pain in right toe, pain in left toes Diabetes with neuropathy. HAV  B/L,  Angiopathy  Treatment & Plan Procedures and Treatment: Consent by patient was obtained for treatment procedures. The patient understood the discussion of treatment and procedures well. All questions were answered thoroughly reviewed. Debridement of mycotic and hypertrophic toenails, 1 through 5 bilateral and clearing of subungual debris. No ulceration, no infection noted.  ABN signed for 2019. Return Visit-Office Procedure: Patient instructed to return to the office for a follow up visit 3 months for continued evaluation and treatment.   Ayham Word DPM 

## 2018-03-09 DIAGNOSIS — I10 Essential (primary) hypertension: Secondary | ICD-10-CM | POA: Diagnosis not present

## 2018-03-09 DIAGNOSIS — I5042 Chronic combined systolic (congestive) and diastolic (congestive) heart failure: Secondary | ICD-10-CM | POA: Diagnosis not present

## 2018-03-09 DIAGNOSIS — M25569 Pain in unspecified knee: Secondary | ICD-10-CM | POA: Diagnosis not present

## 2018-03-09 DIAGNOSIS — E782 Mixed hyperlipidemia: Secondary | ICD-10-CM | POA: Diagnosis not present

## 2018-03-09 DIAGNOSIS — F0390 Unspecified dementia without behavioral disturbance: Secondary | ICD-10-CM | POA: Diagnosis not present

## 2018-03-09 DIAGNOSIS — R3981 Functional urinary incontinence: Secondary | ICD-10-CM | POA: Diagnosis not present

## 2018-03-09 DIAGNOSIS — E870 Hyperosmolality and hypernatremia: Secondary | ICD-10-CM | POA: Diagnosis not present

## 2018-03-09 DIAGNOSIS — R600 Localized edema: Secondary | ICD-10-CM | POA: Diagnosis not present

## 2018-03-09 DIAGNOSIS — E119 Type 2 diabetes mellitus without complications: Secondary | ICD-10-CM | POA: Diagnosis not present

## 2018-03-09 DIAGNOSIS — I700 Atherosclerosis of aorta: Secondary | ICD-10-CM | POA: Diagnosis not present

## 2018-03-09 DIAGNOSIS — D509 Iron deficiency anemia, unspecified: Secondary | ICD-10-CM | POA: Diagnosis not present

## 2018-03-09 DIAGNOSIS — F411 Generalized anxiety disorder: Secondary | ICD-10-CM | POA: Diagnosis not present

## 2018-03-09 DIAGNOSIS — N2889 Other specified disorders of kidney and ureter: Secondary | ICD-10-CM | POA: Diagnosis not present

## 2018-03-19 ENCOUNTER — Encounter (HOSPITAL_COMMUNITY): Payer: Self-pay

## 2018-03-19 ENCOUNTER — Emergency Department (HOSPITAL_COMMUNITY): Payer: Medicare Other

## 2018-03-19 ENCOUNTER — Emergency Department (HOSPITAL_COMMUNITY)
Admission: EM | Admit: 2018-03-19 | Discharge: 2018-03-19 | Disposition: A | Discharge status: Home / Self Care | Payer: Medicare Other | Attending: Emergency Medicine | Admitting: Emergency Medicine

## 2018-03-19 ENCOUNTER — Other Ambulatory Visit: Payer: Self-pay

## 2018-03-19 DIAGNOSIS — R531 Weakness: Secondary | ICD-10-CM | POA: Diagnosis not present

## 2018-03-19 DIAGNOSIS — I700 Atherosclerosis of aorta: Secondary | ICD-10-CM | POA: Diagnosis not present

## 2018-03-19 DIAGNOSIS — E119 Type 2 diabetes mellitus without complications: Secondary | ICD-10-CM | POA: Insufficient documentation

## 2018-03-19 DIAGNOSIS — Z794 Long term (current) use of insulin: Secondary | ICD-10-CM | POA: Diagnosis not present

## 2018-03-19 DIAGNOSIS — R0602 Shortness of breath: Secondary | ICD-10-CM | POA: Diagnosis present

## 2018-03-19 DIAGNOSIS — J40 Bronchitis, not specified as acute or chronic: Secondary | ICD-10-CM | POA: Diagnosis not present

## 2018-03-19 DIAGNOSIS — I509 Heart failure, unspecified: Secondary | ICD-10-CM | POA: Insufficient documentation

## 2018-03-19 DIAGNOSIS — J209 Acute bronchitis, unspecified: Secondary | ICD-10-CM | POA: Diagnosis not present

## 2018-03-19 DIAGNOSIS — F411 Generalized anxiety disorder: Secondary | ICD-10-CM | POA: Diagnosis not present

## 2018-03-19 DIAGNOSIS — I110 Hypertensive heart disease with heart failure: Secondary | ICD-10-CM | POA: Diagnosis not present

## 2018-03-19 DIAGNOSIS — Z79899 Other long term (current) drug therapy: Secondary | ICD-10-CM | POA: Insufficient documentation

## 2018-03-19 DIAGNOSIS — F419 Anxiety disorder, unspecified: Secondary | ICD-10-CM | POA: Diagnosis not present

## 2018-03-19 DIAGNOSIS — I5042 Chronic combined systolic (congestive) and diastolic (congestive) heart failure: Secondary | ICD-10-CM | POA: Diagnosis not present

## 2018-03-19 DIAGNOSIS — R069 Unspecified abnormalities of breathing: Secondary | ICD-10-CM | POA: Diagnosis not present

## 2018-03-19 DIAGNOSIS — R600 Localized edema: Secondary | ICD-10-CM | POA: Diagnosis not present

## 2018-03-19 DIAGNOSIS — J9801 Acute bronchospasm: Secondary | ICD-10-CM | POA: Insufficient documentation

## 2018-03-19 DIAGNOSIS — B372 Candidiasis of skin and nail: Secondary | ICD-10-CM | POA: Diagnosis not present

## 2018-03-19 DIAGNOSIS — D509 Iron deficiency anemia, unspecified: Secondary | ICD-10-CM | POA: Diagnosis not present

## 2018-03-19 DIAGNOSIS — M25561 Pain in right knee: Secondary | ICD-10-CM | POA: Diagnosis not present

## 2018-03-19 DIAGNOSIS — E870 Hyperosmolality and hypernatremia: Secondary | ICD-10-CM | POA: Diagnosis not present

## 2018-03-19 DIAGNOSIS — N393 Stress incontinence (female) (male): Secondary | ICD-10-CM | POA: Diagnosis not present

## 2018-03-19 DIAGNOSIS — F0390 Unspecified dementia without behavioral disturbance: Secondary | ICD-10-CM | POA: Diagnosis not present

## 2018-03-19 DIAGNOSIS — I10 Essential (primary) hypertension: Secondary | ICD-10-CM | POA: Diagnosis not present

## 2018-03-19 DIAGNOSIS — M25562 Pain in left knee: Secondary | ICD-10-CM | POA: Diagnosis not present

## 2018-03-19 DIAGNOSIS — E782 Mixed hyperlipidemia: Secondary | ICD-10-CM | POA: Diagnosis not present

## 2018-03-19 DIAGNOSIS — R05 Cough: Secondary | ICD-10-CM | POA: Diagnosis not present

## 2018-03-19 DIAGNOSIS — N2889 Other specified disorders of kidney and ureter: Secondary | ICD-10-CM | POA: Diagnosis not present

## 2018-03-19 DIAGNOSIS — Z7902 Long term (current) use of antithrombotics/antiplatelets: Secondary | ICD-10-CM | POA: Diagnosis not present

## 2018-03-19 DIAGNOSIS — Z952 Presence of prosthetic heart valve: Secondary | ICD-10-CM | POA: Diagnosis not present

## 2018-03-19 DIAGNOSIS — S8991XA Unspecified injury of right lower leg, initial encounter: Secondary | ICD-10-CM | POA: Diagnosis not present

## 2018-03-19 LAB — BRAIN NATRIURETIC PEPTIDE: B Natriuretic Peptide: 148 pg/mL — ABNORMAL HIGH (ref 0.0–100.0)

## 2018-03-19 LAB — CBC WITH DIFFERENTIAL/PLATELET
BASOS PCT: 0 %
Basophils Absolute: 0 10*3/uL (ref 0.0–0.1)
EOS ABS: 0.1 10*3/uL (ref 0.0–0.7)
Eosinophils Relative: 2 %
HCT: 33.5 % — ABNORMAL LOW (ref 36.0–46.0)
Hemoglobin: 11.1 g/dL — ABNORMAL LOW (ref 12.0–15.0)
Lymphocytes Relative: 30 %
Lymphs Abs: 2.3 10*3/uL (ref 0.7–4.0)
MCH: 27.8 pg (ref 26.0–34.0)
MCHC: 33.1 g/dL (ref 30.0–36.0)
MCV: 84 fL (ref 78.0–100.0)
MONO ABS: 0.7 10*3/uL (ref 0.1–1.0)
MONOS PCT: 9 %
Neutro Abs: 4.7 10*3/uL (ref 1.7–7.7)
Neutrophils Relative %: 59 %
PLATELETS: 248 10*3/uL (ref 150–400)
RBC: 3.99 MIL/uL (ref 3.87–5.11)
RDW: 14.3 % (ref 11.5–15.5)
WBC: 7.9 10*3/uL (ref 4.0–10.5)

## 2018-03-19 LAB — COMPREHENSIVE METABOLIC PANEL
ALBUMIN: 3.5 g/dL (ref 3.5–5.0)
ALT: 21 U/L (ref 14–54)
ANION GAP: 10 (ref 5–15)
AST: 22 U/L (ref 15–41)
Alkaline Phosphatase: 79 U/L (ref 38–126)
BILIRUBIN TOTAL: 0.6 mg/dL (ref 0.3–1.2)
BUN: 16 mg/dL (ref 6–20)
CO2: 27 mmol/L (ref 22–32)
Calcium: 9.9 mg/dL (ref 8.9–10.3)
Chloride: 105 mmol/L (ref 101–111)
Creatinine, Ser: 0.95 mg/dL (ref 0.44–1.00)
GFR calc Af Amer: >60 mL/min (ref >60)
GFR calc non Af Amer: 52 mL/min — ABNORMAL LOW (ref >60)
GLUCOSE: 119 mg/dL — AB (ref 65–99)
POTASSIUM: 4.1 mmol/L (ref 3.5–5.1)
Sodium: 142 mmol/L (ref 135–145)
TOTAL PROTEIN: 6.6 g/dL (ref 6.5–8.1)

## 2018-03-19 MED ORDER — DM-GUAIFENESIN ER 30-600 MG PO TB12
1.0000 | ORAL_TABLET | Freq: Two times a day (BID) | ORAL | Status: DC
  Administered 2018-03-19: 1 via ORAL
  Filled 2018-03-19: qty 1

## 2018-03-19 MED ORDER — PREDNISONE 20 MG PO TABS: ORAL_TABLET | ORAL | 0 refills | Status: DC

## 2018-03-19 MED ORDER — PREDNISONE 50 MG PO TABS
60.0000 mg | ORAL_TABLET | Freq: Once | ORAL | Status: AC
  Administered 2018-03-19: 60 mg via ORAL
  Filled 2018-03-19: qty 1

## 2018-03-19 MED ORDER — AZITHROMYCIN 250 MG PO TABS: 250.0000 mg | ORAL_TABLET | Freq: Every day | ORAL | 0 refills | Status: DC

## 2018-03-19 NOTE — ED Triage Notes (Signed)
Details:  EMS called out for SOB and cough that she noticed this morning. Albuterol and neb tx given at home. Pt was 95% on RA when EMS arrived. O2 sats came up to 99% on 2L. Pt has history of dementia and is a poor historian.

## 2018-03-19 NOTE — Discharge Instructions (Signed)
Use the inhaler or nebulizer as needed for wheezing or shortness of breath.  Take Mucinex DM over-the-counter for cough.  Take the antibiotics and the steroids until gone.  Recheck as needed.

## 2018-03-19 NOTE — ED Provider Notes (Signed)
[name] EMERGENCY DEPARTMENT Provider Note   CSN: [id number] Arrival date & time: 03/19/18  0932  Time seen 06:35 AM   History   Chief Complaint Chief Complaint  Patient presents with  . Shortness of Breath   Level 5 caveat for dementia  HPI Katherine [name] is a 84 y.o. female.  HPI patient presents to the emergency department via EMS for shortness of breath.  EMS reports her initial pulse ox was 95%.  Her CBG was 125.  EMS states they were called to her house yesterday when she fell.  Patient states she slipped in the tub and she has some pain in her right knee.  She denies hitting her head.  I talked to her POA who is also a patient in the ED and she states the patient started having a cough on Friday, April 12.  She has been giving her nebulizer treatment.  She states the patient does not have asthma.  Patient has never smoked but her husband used to smoke heavily.  She states she is on the nebulizer because she had a aortic valve replacement.  Looking at her chart patient also has a history of congestive heart failure.  She denies that the patient has been wheezing but she has had a cough.  She states the patient has not complained of chest pain.  She has not had a fever or vomiting.  She has chronic diarrhea this is worse yesterday.  She was seen for a routine visit with her PCP about 10 days ago.  PCP Celene Squibb, MD   Past Medical History:  Diagnosis Date  . Anemia   . Anxiety   . Aortic valvular stenosis    moderate to severe  EF on 2D Echo 10/22/2012 65-70%  valve area of 1.2cm, and peak and mean gradients of 45 and 1mmHg  . Chalazion of right upper eyelid early June 2016   treating with cream, Rx by Dr. Merlyn Albert    . CHF (congestive heart failure) ([place])   . Coronary artery disease   . Dementia   . Diabetes mellitus, type 2 ([place])   . Dyspnea    chronic  . Easy fatigability   . Gastric mass   . Gastric ulcer   . GERD (gastroesophageal reflux disease)   .  Heart murmur   . Hernia of unspecified site of abdominal cavity without mention of obstruction or gangrene    hiatal  . Hyperlipidemia   . Hypertension   . Normal cardiac stress test 12/2013   normal stress nuclear study  . Osteoarthritis   . Thyroid mass     Patient Active Problem List   Diagnosis Date Noted  . Cough 11/23/2016  . UTI (urinary tract infection) 11/23/2016  . Hypokalemia 11/23/2016  . Chronic diastolic heart failure ([place])   . Acute exacerbation of CHF (congestive heart failure) ([place]) 11/20/2016  . Bronchospasm 12/16/2015  . Acute bronchitis 12/16/2015  . S/p TAVR (transcatheter aortic valve replacement), bioprosthetic 09/02/2015  . Constipation 06/04/2015  . Postoperative pulmonary edema ([place]) 06/04/2015  . Severe aortic stenosis   . SOB (shortness of breath)   . Severe aortic valve stenosis 05/26/2015  . Bilateral leg edema 10/17/2014  . Acute diastolic congestive heart failure, NYHA class 3 ([place]) 10/17/2014  . CHF with left ventricular diastolic dysfunction, NYHA class 2 ([place]) 09/09/2014  . Chest pain 12/04/2013  . Difficulty walking 10/28/2013  . Muscle weakness (generalized) 10/28/2013  . Morbid obesity ([place]) 10/24/2013  .  Moderate to severe aortic stenosis 04/22/2013  . S/P CABG x 4 04/22/2013  . Gastric mass 08/18/2011  . Diabetes mellitus, type 2 ([place])   . CHF 03/04/2010  . COLONIC POLYPS, HX OF 03/04/2010  . THYROID MASS 02/25/2010  . Diabetes mellitus type 2, uncontrolled ([place]) 02/25/2010  . Hyperlipidemia 02/25/2010  . UNSPECIFIED ANEMIA 02/25/2010  . Anxiety state 02/25/2010  . Essential hypertension 02/25/2010  . Coronary atherosclerosis 02/25/2010  . REFLUX ESOPHAGITIS 02/25/2010  . HIATAL HERNIA 02/25/2010  . OSTEOARTHRITIS 02/25/2010  . History of peptic ulcer disease 02/25/2010    Past Surgical History:  Procedure Laterality Date  . AORTIC VALVE SURGERY    . CATARACT EXTRACTION Bilateral   . CORONARY ARTERY BYPASS GRAFT     x4 in  2003  . internal hemorrhoidectomy    . KNEE SURGERY Right   . LEFT AND RIGHT HEART CATHETERIZATION WITH CORONARY/GRAFT ANGIOGRAM N/A 03/24/2015   Procedure: LEFT AND RIGHT HEART CATHETERIZATION WITH Beatrix Fetters;  Surgeon: Burnell Blanks, MD;  Location: [facility] CATH LAB;  Service: Cardiovascular;  Laterality: N/A;  . TEE WITHOUT CARDIOVERSION N/A 05/26/2015   Procedure: TRANSESOPHAGEAL ECHOCARDIOGRAM (TEE);  Surgeon: Burnell Blanks, MD;  Location: [place];  Service: Open Heart Surgery;  Laterality: N/A;  . TRANSCATHETER AORTIC VALVE REPLACEMENT, TRANSFEMORAL N/A 05/26/2015   Procedure: TRANSCATHETER AORTIC VALVE REPLACEMENT, TRANSFEMORAL;  Surgeon: Burnell Blanks, MD;  Location: [place];  Service: Open Heart Surgery;  Laterality: N/A;  . TUBAL LIGATION    . uterine polypectomy       OB History   None      Home Medications    Prior to Admission medications   Medication Sig Start Date End Date Taking? Authorizing Provider  acetaminophen (TYLENOL) 500 MG tablet Take two tablets by mouth twice daily for pain. Do not exceed 4gm of Tylenol in 24 hours    [provider]  ALPRAZolam (XANAX) 0.25 MG tablet Take 0.5 tablets (0.125 mg total) by mouth 2 (two) times daily as needed for anxiety or sleep. Patient taking differently: Take 0.25 mg by mouth 3 (three) times daily.  06/03/15   Barrett, Evelene Croon, PA-C  atorvastatin (LIPITOR) 80 MG tablet Take 80 mg by mouth daily.     [provider]  azithromycin (ZITHROMAX) 250 MG tablet Take 1 tablet (250 mg total) by mouth daily. 03/19/18   Rolland Porter, MD  clopidogrel (PLAVIX) 75 MG tablet Take 1 tablet (75 mg total) by mouth daily with breakfast. 06/03/15   Barrett, Evelene Croon, PA-C  fexofenadine (ALLEGRA) 180 MG tablet Take 180 mg by mouth daily.    [provider]  furosemide (LASIX) 20 MG tablet TAKE 2 TABLETS BY MOUTH DAILY 05/17/17   Hilty, Nadean Corwin, MD  guaiFENesin (MUCINEX) 600 MG 12 hr tablet Take  1 tablet (600 mg total) by mouth 2 (two) times daily. Patient taking differently: Take 600 mg by mouth 2 (two) times daily as needed. For acute pulmonary edema. Do not crush. 06/03/15   Barrett, Evelene Croon, PA-C  hydrALAZINE (APRESOLINE) 50 MG tablet Take 1 tablet (50 mg total) by mouth every 8 (eight) hours. Patient taking differently: Take 50 mg by mouth every 8 (eight) hours. For blood pressure 06/03/15   Barrett, Evelene Croon, PA-C  Ipratropium-Albuterol (COMBIVENT RESPIMAT) 20-100 MCG/ACT AERS respimat Inhale 2 puffs into the lungs every 6 (six) hours as needed for wheezing or shortness of breath. 10/17/14   Hilty, Nadean Corwin, MD  lactulose ([place]) 10 GM/15ML  solution Take 45 mLs (30 g total) by mouth daily as needed for mild constipation or severe constipation. 06/03/15   Barrett, Evelene Croon, PA-C  LEVEMIR FLEXTOUCH 100 UNIT/ML Pen Use as directed 01/31/17   [provider]  Linaclotide (LINZESS) 145 MCG CAPS capsule Take 145 mcg by mouth daily as needed (for constipation).    [provider]  lisinopril (PRINIVIL,ZESTRIL) 10 MG tablet Take 1 tablet (10 mg total) by mouth daily. 06/03/15   Barrett, Evelene Croon, PA-C  loperamide (IMODIUM) 2 MG capsule Take 2 mg by mouth as needed for diarrhea or loose stools.    [provider]  meloxicam (MOBIC) 7.5 MG tablet Take 7.5 mg by mouth daily as needed for pain.  11/18/16   [provider]  Memantine HCl ER (NAMENDA XR) 28 MG CP24 Take one capsule by mouth every morning for dementia    [provider]  metFORMIN (GLUCOPHAGE-XR) 500 MG 24 hr tablet Take 500 mg by mouth daily with breakfast. To control blood sugar. Do not crush. 03/16/15   [provider]  nebivolol (BYSTOLIC) 10 MG tablet Take one tablet by mouth every morning for blood pressure    [provider]  NEXIUM 20 MG capsule Take one capsule by mouth once daily for reflux 04/25/14   [provider]  NYSTATIN powder Apply 1 application  topically 2 (two) times daily as needed for for breast care. 11/15/16   [provider]  potassium chloride SA (K-DUR,KLOR-CON) 20 MEQ tablet Take 20 mEq by mouth daily.    [provider]  predniSONE (DELTASONE) 20 MG tablet Take 2 po QD x 3d then 1 po QD x 3d 03/19/18   Rolland Porter, MD  traMADol (ULTRAM) 50 MG tablet Take 1-2 tablets (50-100 mg total) by mouth every 4 (four) hours as needed for moderate pain. 06/03/15   Barrett, Evelene Croon, PA-C  Vitamin D, Ergocalciferol, (DRISDOL) 50000 units CAPS capsule Take 1 capsule (50,000 Units total) by mouth every 7 (seven) days. 11/24/16   Holley Raring, MD  VOLTAREN 1 % GEL Apply 2 g topically daily as needed (pain).  03/25/13   [provider]    Family History Family History  Problem Relation Age of Onset  . Diabetes Mother   . Heart disease Mother   . Stroke Mother   . Cerebral aneurysm Mother   . Stroke Father   . Heart attack Brother   . Colon cancer Neg Hx     Social History Social History   Tobacco Use  . Smoking status: Never Smoker  . Smokeless tobacco: Never Used  Substance Use Topics  . Alcohol use: No  . Drug use: No  lives at home Uses a walker and wheelchair   Allergies   Tape   Review of Systems Review of Systems  Unable to perform ROS: Dementia     Physical Exam Updated Vital Signs BP (!) 144/45   Pulse 71   Temp 98.2 F (36.8 C) (Oral)   Resp 17   Ht 5\' 2"  (1.575 m)   Wt 93.9 kg (207 lb)   SpO2 97%   BMI 37.86 kg/m   Vital signs normal    Physical Exam  Constitutional: She is oriented to person, place, and time. She appears well-developed and well-nourished.  Non-toxic appearance. She does not appear ill. No distress.  obese  HENT:  Head: Normocephalic and atraumatic.  Right Ear: External ear normal.  Left Ear: External ear normal.  Nose:  Nose normal. No mucosal edema or rhinorrhea.  Mouth/Throat: Oropharynx is clear and moist and mucous membranes are normal. No  dental abscesses or uvula swelling.  Eyes: Pupils are equal, round, and reactive to light. Conjunctivae and EOM are normal.  Neck: Normal range of motion and full passive range of motion without pain. Neck supple.  Cardiovascular: Normal rate, regular rhythm and normal heart sounds. Exam reveals no gallop and no friction rub.  No murmur heard. Pulmonary/Chest: Effort normal. No respiratory distress. She has decreased breath sounds. She has no wheezes. She has no rhonchi. She has no rales. She exhibits no tenderness and no crepitus.  Abdominal: Soft. Normal appearance and bowel sounds are normal. She exhibits no distension. There is no tenderness. There is no rebound and no guarding.  Musculoskeletal: Normal range of motion. She exhibits no edema or tenderness.  Mild diffuse swelling of the right knee however family states it is always swollen like that.  There is no bruising or abrasions seen.  Neurological: She is alert and oriented to person, place, and time. She has normal strength. No cranial nerve deficit.  Skin: Skin is warm, dry and intact. No rash noted. No erythema. No pallor.  Psychiatric: She has a normal mood and affect. Her speech is normal and behavior is normal. Her mood appears not anxious.  Nursing note and vitals reviewed.    ED Treatments / Results  Labs (all labs ordered are listed, but only abnormal results are displayed) Results for orders placed or performed during the hospital encounter of 03/19/18  CBC with Differential  Result Value Ref Range   WBC 7.9 4.0 - 10.5 K/uL   RBC 3.99 3.87 - 5.11 MIL/uL   Hemoglobin 11.1 (L) 12.0 - 15.0 g/dL   HCT 33.5 (L) 36.0 - 46.0 %   MCV 84.0 78.0 - 100.0 fL   MCH 27.8 26.0 - 34.0 pg   MCHC 33.1 30.0 - 36.0 g/dL   RDW 14.3 11.5 - 15.5 %   Platelets 248 150 - 400 K/uL   Neutrophils Relative % 59 %   Neutro Abs 4.7 1.7 - 7.7 K/uL   Lymphocytes Relative 30 %   Lymphs Abs 2.3 0.7 - 4.0 K/uL   Monocytes Relative 9 %   Monocytes  Absolute 0.7 0.1 - 1.0 K/uL   Eosinophils Relative 2 %   Eosinophils Absolute 0.1 0.0 - 0.7 K/uL   Basophils Relative 0 %   Basophils Absolute 0.0 0.0 - 0.1 K/uL  Comprehensive metabolic panel  Result Value Ref Range   Sodium 142 135 - 145 mmol/L   Potassium 4.1 3.5 - 5.1 mmol/L   Chloride 105 101 - 111 mmol/L   CO2 27 22 - 32 mmol/L   Glucose, Bld 119 (H) 65 - 99 mg/dL   BUN 16 6 - 20 mg/dL   Creatinine, Ser 0.95 0.44 - 1.00 mg/dL   Calcium 9.9 8.9 - 10.3 mg/dL   Total Protein 6.6 6.5 - 8.1 g/dL   Albumin 3.5 3.5 - 5.0 g/dL   AST 22 15 - 41 U/L   ALT 21 14 - 54 U/L   Alkaline Phosphatase 79 38 - 126 U/L   Total Bilirubin 0.6 0.3 - 1.2 mg/dL   GFR calc non Af Amer 52 (L) >60 mL/min   GFR calc Af Amer >60 >60 mL/min   Anion gap 10 5 - 15  Brain natriuretic peptide  Result Value Ref Range   B Natriuretic Peptide 148.0 (H) 0.0 - 100.0  pg/mL    Laboratory interpretation all normal except minimally elevated BNP that is not significant, mild anemia   EKG EKG Interpretation  Date/Time:  Monday March 19 2018 06:30:37 EDT Ventricular Rate:  76 PR Interval:    QRS Duration: 88 QT Interval:  389 QTC Calculation: 438 R Axis:   4 Text Interpretation:  Sinus rhythm Low voltage, precordial leads Abnormal R-wave progression, early transition Baseline wander in lead(s) II III aVL aVF No significant change since last tracing 23 Nov 2016 Confirmed by Rolland Porter [phone]) on 03/19/2018 6:53:16 AM   Radiology Dg Chest 2 View  Result Date: 03/19/2018 CLINICAL DATA:  84 year old female with shortness of breath and cough this morning. Fell onto right knee. Initial encounter. EXAM: CHEST - 2 VIEW COMPARISON:  11/22/2016 chest x-ray. FINDINGS: Post CABG and endovascular valve treatment.  Cardiomegaly. Pulmonary vascular prominence without pulmonary edema. Calcified tortuous aorta. No segmental consolidation or pneumothorax. No acute osseous abnormality although limited evaluation of the thoracic  spine and upper lumbar spine on lateral view. IMPRESSION: Post CABG and endovascular valve treatment. Cardiomegaly. Pulmonary vascular prominence without pulmonary edema. Aortic Atherosclerosis (ICD10-I70.0). No acute osseous abnormality although limited evaluation of the thoracic spine and upper lumbar spine on lateral view. Electronically Signed   By: Genia Del M.D.   On: 03/19/2018 08:13   Dg Knee Complete 4 Views Right  Result Date: 03/19/2018 CLINICAL DATA:  84 year old female post fall.  Initial encounter. EXAM: RIGHT KNEE - COMPLETE 4+ VIEW COMPARISON:  None. FINDINGS: Prominent tricompartment degenerative changes. No fracture or dislocation. Small joint effusion. Vascular calcifications. Prior vein stripping procedure. IMPRESSION: Prominent tricompartment degenerative changes without evidence of fracture or dislocation. Detection of subtle injury limited by marked degenerative changes. If there remains a high clinical suspicion of knee injury, CT imaging can be performed. Electronically Signed   By: Genia Del M.D.   On: 03/19/2018 08:20    Procedures Procedures (including critical care time)  Medications Ordered in ED Medications  dextromethorphan-guaiFENesin (MUCINEX DM) 30-600 MG per 12 hr tablet 1 tablet (1 tablet Oral Given 03/19/18 2671)  predniSONE (DELTASONE) tablet 60 mg (60 mg Oral Given 03/19/18 2458)     Initial Impression / Assessment and Plan / ED Course  I have reviewed the triage vital signs and the nursing notes.  Pertinent labs & imaging results that were available during my care of the patient were reviewed by me and considered in my medical decision making (see chart for details).    Laboratory testing and chest x-ray was ordered.  At this time patient does not appear to have labored breathing.  Patient was given oral prednisone and Mucinex DM for cough.  Recheck at 8:30 AM new family is now in the room with patient.  She states her breathing seems fine at  this time.  We discussed treating her for bronchitis, she was discharged home with antibiotic, low-dose steroids, and that give her Mucinex DM over-the-counter for cough.  They can use a inhaler or nebulizer at home for shortness of breath as needed.   Final Clinical Impressions(s) / ED Diagnoses   Final diagnoses:  Bronchitis with bronchospasm    ED Discharge Orders        Ordered    azithromycin (ZITHROMAX) 250 MG tablet  Daily     03/19/18 0839    predniSONE (DELTASONE) 20 MG tablet     03/19/18 0998      Plan discharge  Rolland Porter, MD, [place] Small,  Daleen Bo, MD 03/19/18 [phone]

## 2018-03-21 DIAGNOSIS — I110 Hypertensive heart disease with heart failure: Secondary | ICD-10-CM | POA: Diagnosis not present

## 2018-03-21 DIAGNOSIS — M25561 Pain in right knee: Secondary | ICD-10-CM | POA: Diagnosis not present

## 2018-03-21 DIAGNOSIS — F0390 Unspecified dementia without behavioral disturbance: Secondary | ICD-10-CM | POA: Diagnosis not present

## 2018-03-21 DIAGNOSIS — R531 Weakness: Secondary | ICD-10-CM | POA: Diagnosis not present

## 2018-03-21 DIAGNOSIS — E119 Type 2 diabetes mellitus without complications: Secondary | ICD-10-CM | POA: Diagnosis not present

## 2018-03-21 DIAGNOSIS — M25562 Pain in left knee: Secondary | ICD-10-CM | POA: Diagnosis not present

## 2018-03-27 DIAGNOSIS — F0390 Unspecified dementia without behavioral disturbance: Secondary | ICD-10-CM | POA: Diagnosis not present

## 2018-03-27 DIAGNOSIS — M25561 Pain in right knee: Secondary | ICD-10-CM | POA: Diagnosis not present

## 2018-03-27 DIAGNOSIS — E119 Type 2 diabetes mellitus without complications: Secondary | ICD-10-CM | POA: Diagnosis not present

## 2018-03-27 DIAGNOSIS — R531 Weakness: Secondary | ICD-10-CM | POA: Diagnosis not present

## 2018-03-27 DIAGNOSIS — M25562 Pain in left knee: Secondary | ICD-10-CM | POA: Diagnosis not present

## 2018-03-27 DIAGNOSIS — I110 Hypertensive heart disease with heart failure: Secondary | ICD-10-CM | POA: Diagnosis not present

## 2018-03-29 DIAGNOSIS — R600 Localized edema: Secondary | ICD-10-CM | POA: Diagnosis not present

## 2018-03-29 DIAGNOSIS — Z6835 Body mass index (BMI) 35.0-35.9, adult: Secondary | ICD-10-CM | POA: Diagnosis not present

## 2018-03-29 DIAGNOSIS — R05 Cough: Secondary | ICD-10-CM | POA: Diagnosis not present

## 2018-03-29 DIAGNOSIS — F0390 Unspecified dementia without behavioral disturbance: Secondary | ICD-10-CM | POA: Diagnosis not present

## 2018-03-29 DIAGNOSIS — I700 Atherosclerosis of aorta: Secondary | ICD-10-CM | POA: Diagnosis not present

## 2018-03-30 DIAGNOSIS — E119 Type 2 diabetes mellitus without complications: Secondary | ICD-10-CM | POA: Diagnosis not present

## 2018-03-30 DIAGNOSIS — M25561 Pain in right knee: Secondary | ICD-10-CM | POA: Diagnosis not present

## 2018-03-30 DIAGNOSIS — I110 Hypertensive heart disease with heart failure: Secondary | ICD-10-CM | POA: Diagnosis not present

## 2018-03-30 DIAGNOSIS — F0390 Unspecified dementia without behavioral disturbance: Secondary | ICD-10-CM | POA: Diagnosis not present

## 2018-03-30 DIAGNOSIS — R531 Weakness: Secondary | ICD-10-CM | POA: Diagnosis not present

## 2018-03-30 DIAGNOSIS — M25562 Pain in left knee: Secondary | ICD-10-CM | POA: Diagnosis not present

## 2018-04-02 DIAGNOSIS — I110 Hypertensive heart disease with heart failure: Secondary | ICD-10-CM | POA: Diagnosis not present

## 2018-04-02 DIAGNOSIS — M25562 Pain in left knee: Secondary | ICD-10-CM | POA: Diagnosis not present

## 2018-04-02 DIAGNOSIS — F0390 Unspecified dementia without behavioral disturbance: Secondary | ICD-10-CM | POA: Diagnosis not present

## 2018-04-02 DIAGNOSIS — R531 Weakness: Secondary | ICD-10-CM | POA: Diagnosis not present

## 2018-04-02 DIAGNOSIS — M25561 Pain in right knee: Secondary | ICD-10-CM | POA: Diagnosis not present

## 2018-04-02 DIAGNOSIS — E119 Type 2 diabetes mellitus without complications: Secondary | ICD-10-CM | POA: Diagnosis not present

## 2018-04-06 DIAGNOSIS — I110 Hypertensive heart disease with heart failure: Secondary | ICD-10-CM | POA: Diagnosis not present

## 2018-04-06 DIAGNOSIS — R531 Weakness: Secondary | ICD-10-CM | POA: Diagnosis not present

## 2018-04-06 DIAGNOSIS — F0390 Unspecified dementia without behavioral disturbance: Secondary | ICD-10-CM | POA: Diagnosis not present

## 2018-04-06 DIAGNOSIS — M25561 Pain in right knee: Secondary | ICD-10-CM | POA: Diagnosis not present

## 2018-04-06 DIAGNOSIS — E119 Type 2 diabetes mellitus without complications: Secondary | ICD-10-CM | POA: Diagnosis not present

## 2018-04-06 DIAGNOSIS — M25562 Pain in left knee: Secondary | ICD-10-CM | POA: Diagnosis not present

## 2018-04-09 DIAGNOSIS — I110 Hypertensive heart disease with heart failure: Secondary | ICD-10-CM | POA: Diagnosis not present

## 2018-04-09 DIAGNOSIS — E119 Type 2 diabetes mellitus without complications: Secondary | ICD-10-CM | POA: Diagnosis not present

## 2018-04-09 DIAGNOSIS — M25562 Pain in left knee: Secondary | ICD-10-CM | POA: Diagnosis not present

## 2018-04-09 DIAGNOSIS — F0390 Unspecified dementia without behavioral disturbance: Secondary | ICD-10-CM | POA: Diagnosis not present

## 2018-04-09 DIAGNOSIS — R531 Weakness: Secondary | ICD-10-CM | POA: Diagnosis not present

## 2018-04-09 DIAGNOSIS — M25561 Pain in right knee: Secondary | ICD-10-CM | POA: Diagnosis not present

## 2018-04-13 DIAGNOSIS — M25561 Pain in right knee: Secondary | ICD-10-CM | POA: Diagnosis not present

## 2018-04-13 DIAGNOSIS — E119 Type 2 diabetes mellitus without complications: Secondary | ICD-10-CM | POA: Diagnosis not present

## 2018-04-13 DIAGNOSIS — M25562 Pain in left knee: Secondary | ICD-10-CM | POA: Diagnosis not present

## 2018-04-13 DIAGNOSIS — R531 Weakness: Secondary | ICD-10-CM | POA: Diagnosis not present

## 2018-04-13 DIAGNOSIS — I110 Hypertensive heart disease with heart failure: Secondary | ICD-10-CM | POA: Diagnosis not present

## 2018-04-13 DIAGNOSIS — F0390 Unspecified dementia without behavioral disturbance: Secondary | ICD-10-CM | POA: Diagnosis not present

## 2018-06-08 DIAGNOSIS — E785 Hyperlipidemia, unspecified: Secondary | ICD-10-CM | POA: Diagnosis not present

## 2018-06-08 DIAGNOSIS — E119 Type 2 diabetes mellitus without complications: Secondary | ICD-10-CM | POA: Diagnosis not present

## 2018-06-12 DIAGNOSIS — K5909 Other constipation: Secondary | ICD-10-CM | POA: Diagnosis not present

## 2018-06-12 DIAGNOSIS — R600 Localized edema: Secondary | ICD-10-CM | POA: Diagnosis not present

## 2018-06-12 DIAGNOSIS — M25569 Pain in unspecified knee: Secondary | ICD-10-CM | POA: Diagnosis not present

## 2018-06-12 DIAGNOSIS — N393 Stress incontinence (female) (male): Secondary | ICD-10-CM | POA: Diagnosis not present

## 2018-06-12 DIAGNOSIS — G4700 Insomnia, unspecified: Secondary | ICD-10-CM | POA: Diagnosis not present

## 2018-06-12 DIAGNOSIS — I10 Essential (primary) hypertension: Secondary | ICD-10-CM | POA: Diagnosis not present

## 2018-06-12 DIAGNOSIS — D509 Iron deficiency anemia, unspecified: Secondary | ICD-10-CM | POA: Diagnosis not present

## 2018-06-12 DIAGNOSIS — E870 Hyperosmolality and hypernatremia: Secondary | ICD-10-CM | POA: Diagnosis not present

## 2018-06-12 DIAGNOSIS — I5042 Chronic combined systolic (congestive) and diastolic (congestive) heart failure: Secondary | ICD-10-CM | POA: Diagnosis not present

## 2018-06-12 DIAGNOSIS — F0390 Unspecified dementia without behavioral disturbance: Secondary | ICD-10-CM | POA: Diagnosis not present

## 2018-06-12 DIAGNOSIS — N2889 Other specified disorders of kidney and ureter: Secondary | ICD-10-CM | POA: Diagnosis not present

## 2018-06-13 ENCOUNTER — Encounter: Payer: Self-pay | Admitting: Podiatry

## 2018-06-13 ENCOUNTER — Ambulatory Visit (INDEPENDENT_AMBULATORY_CARE_PROVIDER_SITE_OTHER): Payer: Medicare Other | Admitting: Podiatry

## 2018-06-13 DIAGNOSIS — M79675 Pain in left toe(s): Secondary | ICD-10-CM

## 2018-06-13 DIAGNOSIS — B351 Tinea unguium: Secondary | ICD-10-CM

## 2018-06-13 DIAGNOSIS — M79674 Pain in right toe(s): Secondary | ICD-10-CM | POA: Diagnosis not present

## 2018-06-13 DIAGNOSIS — D689 Coagulation defect, unspecified: Secondary | ICD-10-CM

## 2018-06-13 DIAGNOSIS — E1140 Type 2 diabetes mellitus with diabetic neuropathy, unspecified: Secondary | ICD-10-CM

## 2018-06-13 NOTE — Progress Notes (Signed)
Patient ID: [name], female   DOB: 14-May-1928, 84 y.o.   MRN: [id number] Complaint:  Visit Type: Patient returns to my office for continued preventative foot care services. Complaint: Patient states" my nails have grown long and thick and become painful to walk and wear shoes" Patient has been diagnosed with DM with no foot complications. The patient presents for preventative foot care services. No changes to ROS.  Patient is taking plavix.  Podiatric Exam: Vascular: dorsalis pedis and posterior tibial pulses are not  palpable bilateral due to foot swelling.. Capillary return is immediate. Temperature gradient is WNL. Skin turgor WNL  Sensorium: Diminished Semmes Weinstein monofilament test. Normal tactile sensation bilaterally. Nail Exam: Pt has thick disfigured discolored nails with subungual debris noted bilateral entire nail hallux through fifth toenails Ulcer Exam: There is no evidence of ulcer or pre-ulcerative changes or infection. Orthopedic Exam: Muscle tone and strength are WNL. No limitations in general ROM. No crepitus or effusions noted. Foot type and digits show no abnormalities. Bony prominences are unremarkable. HAV deformity B/L Skin: No Porokeratosis. No infection or ulcers  Diagnosis:  Onychomycosis, , Pain in right toe, pain in left toes Diabetes with neuropathy. HAV  B/L,  Angiopathy  Treatment & Plan Procedures and Treatment: Consent by patient was obtained for treatment procedures. The patient understood the discussion of treatment and procedures well. All questions were answered thoroughly reviewed. Debridement of mycotic and hypertrophic toenails, 1 through 5 bilateral and clearing of subungual debris. No ulceration, no infection noted.  ABN signed for 2019. Patient qualifies for diabetic shoes for DPN HAV  B/L. Return Visit-Office Procedure: Patient instructed to return to the office for a follow up visit 3 months for continued evaluation and  treatment.   Gardiner Barefoot DPM

## 2018-06-26 ENCOUNTER — Emergency Department (HOSPITAL_COMMUNITY): Payer: Medicare Other

## 2018-06-26 ENCOUNTER — Other Ambulatory Visit: Payer: Self-pay

## 2018-06-26 ENCOUNTER — Encounter (HOSPITAL_COMMUNITY): Payer: Self-pay

## 2018-06-26 ENCOUNTER — Inpatient Hospital Stay (HOSPITAL_COMMUNITY)
Admission: EM | Admit: 2018-06-26 | Discharge: 2018-06-28 | DRG: 689 | Disposition: A | Discharge status: Home / Self Care | Payer: Medicare Other | Attending: Internal Medicine | Admitting: Internal Medicine

## 2018-06-26 DIAGNOSIS — I10 Essential (primary) hypertension: Secondary | ICD-10-CM | POA: Diagnosis present

## 2018-06-26 DIAGNOSIS — R319 Hematuria, unspecified: Secondary | ICD-10-CM

## 2018-06-26 DIAGNOSIS — E1165 Type 2 diabetes mellitus with hyperglycemia: Secondary | ICD-10-CM | POA: Diagnosis not present

## 2018-06-26 DIAGNOSIS — E860 Dehydration: Secondary | ICD-10-CM | POA: Diagnosis present

## 2018-06-26 DIAGNOSIS — K219 Gastro-esophageal reflux disease without esophagitis: Secondary | ICD-10-CM | POA: Diagnosis present

## 2018-06-26 DIAGNOSIS — N3001 Acute cystitis with hematuria: Secondary | ICD-10-CM

## 2018-06-26 DIAGNOSIS — I110 Hypertensive heart disease with heart failure: Secondary | ICD-10-CM | POA: Diagnosis present

## 2018-06-26 DIAGNOSIS — E785 Hyperlipidemia, unspecified: Secondary | ICD-10-CM | POA: Diagnosis present

## 2018-06-26 DIAGNOSIS — Z66 Do not resuscitate: Secondary | ICD-10-CM | POA: Diagnosis present

## 2018-06-26 DIAGNOSIS — B9620 Unspecified Escherichia coli [E. coli] as the cause of diseases classified elsewhere: Secondary | ICD-10-CM | POA: Diagnosis present

## 2018-06-26 DIAGNOSIS — G9341 Metabolic encephalopathy: Secondary | ICD-10-CM | POA: Diagnosis not present

## 2018-06-26 DIAGNOSIS — N390 Urinary tract infection, site not specified: Principal | ICD-10-CM

## 2018-06-26 DIAGNOSIS — F0390 Unspecified dementia without behavioral disturbance: Secondary | ICD-10-CM | POA: Diagnosis present

## 2018-06-26 DIAGNOSIS — Z833 Family history of diabetes mellitus: Secondary | ICD-10-CM

## 2018-06-26 DIAGNOSIS — Z952 Presence of prosthetic heart valve: Secondary | ICD-10-CM

## 2018-06-26 DIAGNOSIS — I2510 Atherosclerotic heart disease of native coronary artery without angina pectoris: Secondary | ICD-10-CM | POA: Diagnosis present

## 2018-06-26 DIAGNOSIS — Z6837 Body mass index (BMI) 37.0-37.9, adult: Secondary | ICD-10-CM

## 2018-06-26 DIAGNOSIS — E669 Obesity, unspecified: Secondary | ICD-10-CM | POA: Diagnosis present

## 2018-06-26 DIAGNOSIS — Z951 Presence of aortocoronary bypass graft: Secondary | ICD-10-CM | POA: Diagnosis not present

## 2018-06-26 DIAGNOSIS — Z7902 Long term (current) use of antithrombotics/antiplatelets: Secondary | ICD-10-CM | POA: Diagnosis not present

## 2018-06-26 DIAGNOSIS — W19XXXA Unspecified fall, initial encounter: Secondary | ICD-10-CM

## 2018-06-26 DIAGNOSIS — S299XXA Unspecified injury of thorax, initial encounter: Secondary | ICD-10-CM | POA: Diagnosis not present

## 2018-06-26 DIAGNOSIS — I5032 Chronic diastolic (congestive) heart failure: Secondary | ICD-10-CM | POA: Diagnosis not present

## 2018-06-26 DIAGNOSIS — Z794 Long term (current) use of insulin: Secondary | ICD-10-CM

## 2018-06-26 DIAGNOSIS — R739 Hyperglycemia, unspecified: Secondary | ICD-10-CM

## 2018-06-26 DIAGNOSIS — S199XXA Unspecified injury of neck, initial encounter: Secondary | ICD-10-CM | POA: Diagnosis not present

## 2018-06-26 DIAGNOSIS — S0990XA Unspecified injury of head, initial encounter: Secondary | ICD-10-CM | POA: Diagnosis not present

## 2018-06-26 HISTORY — DX: Metabolic encephalopathy: G93.41

## 2018-06-26 LAB — CBC WITH DIFFERENTIAL/PLATELET
Basophils Absolute: 0 10*3/uL (ref 0.0–0.1)
Basophils Relative: 0 %
Eosinophils Absolute: 0.1 10*3/uL (ref 0.0–0.7)
Eosinophils Relative: 1 %
HCT: 36.6 % (ref 36.0–46.0)
Hemoglobin: 12.5 g/dL (ref 12.0–15.0)
Lymphocytes Relative: 18 %
Lymphs Abs: 1.9 10*3/uL (ref 0.7–4.0)
MCH: 28.5 pg (ref 26.0–34.0)
MCHC: 34.2 g/dL (ref 30.0–36.0)
MCV: 83.6 fL (ref 78.0–100.0)
Monocytes Absolute: 1 10*3/uL (ref 0.1–1.0)
Monocytes Relative: 9 %
Neutro Abs: 7.5 10*3/uL (ref 1.7–7.7)
Neutrophils Relative %: 72 %
Platelets: 282 10*3/uL (ref 150–400)
RBC: 4.38 MIL/uL (ref 3.87–5.11)
RDW: 13.8 % (ref 11.5–15.5)
WBC: 10.5 10*3/uL (ref 4.0–10.5)

## 2018-06-26 LAB — URINALYSIS, ROUTINE W REFLEX MICROSCOPIC
Bilirubin Urine: NEGATIVE
Glucose, UA: >=500 mg/dL — AB
Hgb urine dipstick: NEGATIVE
Ketones, ur: NEGATIVE mg/dL
Nitrite: NEGATIVE
Protein, ur: NEGATIVE mg/dL
Specific Gravity, Urine: 1.013 (ref 1.005–1.030)
WBC, UA: >50 WBC/hpf — ABNORMAL HIGH (ref 0–5)
pH: 5 (ref 5.0–8.0)

## 2018-06-26 LAB — COMPREHENSIVE METABOLIC PANEL
ALT: 18 U/L (ref 0–44)
AST: 22 U/L (ref 15–41)
Albumin: 3.9 g/dL (ref 3.5–5.0)
Alkaline Phosphatase: 90 U/L (ref 38–126)
Anion gap: 9 (ref 5–15)
BUN: 21 mg/dL (ref 8–23)
CO2: 29 mmol/L (ref 22–32)
Calcium: 10.3 mg/dL (ref 8.9–10.3)
Chloride: 100 mmol/L (ref 98–111)
Creatinine, Ser: 1.16 mg/dL — ABNORMAL HIGH (ref 0.44–1.00)
GFR calc Af Amer: 47 mL/min — ABNORMAL LOW (ref >60)
GFR calc non Af Amer: 41 mL/min — ABNORMAL LOW (ref >60)
Glucose, Bld: 301 mg/dL — ABNORMAL HIGH (ref 70–99)
Potassium: 4.2 mmol/L (ref 3.5–5.1)
Sodium: 138 mmol/L (ref 135–145)
Total Bilirubin: 0.5 mg/dL (ref 0.3–1.2)
Total Protein: 7.2 g/dL (ref 6.5–8.1)

## 2018-06-26 LAB — TSH: TSH: 0.846 u[IU]/mL (ref 0.350–4.500)

## 2018-06-26 LAB — CBG MONITORING, ED
GLUCOSE-CAPILLARY: 206 mg/dL — AB (ref 70–99)
Glucose-Capillary: 284 mg/dL — ABNORMAL HIGH (ref 70–99)
Glucose-Capillary: 275 mg/dL — ABNORMAL HIGH (ref 70–99)

## 2018-06-26 LAB — GLUCOSE, CAPILLARY
GLUCOSE-CAPILLARY: 105 mg/dL — AB (ref 70–99)
Glucose-Capillary: 163 mg/dL — ABNORMAL HIGH (ref 70–99)

## 2018-06-26 LAB — HEMOGLOBIN A1C
Hgb A1c MFr Bld: 6.4 % — ABNORMAL HIGH (ref 4.8–5.6)
Mean Plasma Glucose: 136.98 mg/dL

## 2018-06-26 LAB — VITAMIN B12: Vitamin B-12: 847 pg/mL (ref 180–914)

## 2018-06-26 LAB — I-STAT CG4 LACTIC ACID, ED
Lactic Acid, Venous: 2.6 mmol/L (ref 0.5–1.9)
Lactic Acid, Venous: 1.86 mmol/L (ref 0.5–1.9)

## 2018-06-26 LAB — CK: Total CK: 118 U/L (ref 38–234)

## 2018-06-26 LAB — I-STAT TROPONIN, ED: Troponin i, poc: 0.04 ng/mL (ref 0.00–0.08)

## 2018-06-26 LAB — BRAIN NATRIURETIC PEPTIDE: B Natriuretic Peptide: 247 pg/mL — ABNORMAL HIGH (ref 0.0–100.0)

## 2018-06-26 LAB — T4, FREE: Free T4: 0.94 ng/dL (ref 0.82–1.77)

## 2018-06-26 MED ORDER — ATORVASTATIN CALCIUM 40 MG PO TABS
80.0000 mg | ORAL_TABLET | Freq: Every day | ORAL | Status: DC
  Administered 2018-06-27 – 2018-06-28 (×2): 80 mg via ORAL
  Filled 2018-06-26 (×4): qty 2

## 2018-06-26 MED ORDER — NEBIVOLOL HCL 10 MG PO TABS
5.0000 mg | ORAL_TABLET | Freq: Every day | ORAL | Status: DC
  Administered 2018-06-27 – 2018-06-28 (×2): 5 mg via ORAL
  Filled 2018-06-26 (×2): qty 1

## 2018-06-26 MED ORDER — SODIUM CHLORIDE 0.9 % IV SOLN
1.0000 g | INTRAVENOUS | Status: DC
  Administered 2018-06-27: 1 g via INTRAVENOUS
  Filled 2018-06-26: qty 10
  Filled 2018-06-26: qty 1

## 2018-06-26 MED ORDER — SODIUM CHLORIDE 0.9 % IV BOLUS
500.0000 mL | Freq: Once | INTRAVENOUS | Status: AC
  Administered 2018-06-26: 500 mL via INTRAVENOUS

## 2018-06-26 MED ORDER — ENOXAPARIN SODIUM 40 MG/0.4ML ~~LOC~~ SOLN
40.0000 mg | SUBCUTANEOUS | Status: DC
  Administered 2018-06-26 – 2018-06-27 (×2): 40 mg via SUBCUTANEOUS
  Filled 2018-06-26 (×2): qty 0.4

## 2018-06-26 MED ORDER — ONDANSETRON HCL 4 MG PO TABS: 4.0000 mg | ORAL_TABLET | Freq: Four times a day (QID) | ORAL | Status: DC | PRN

## 2018-06-26 MED ORDER — ONDANSETRON HCL 4 MG/2ML IJ SOLN: 4.0000 mg | Freq: Four times a day (QID) | INTRAMUSCULAR | Status: DC | PRN

## 2018-06-26 MED ORDER — ACETAMINOPHEN 650 MG RE SUPP: 650.0000 mg | Freq: Four times a day (QID) | RECTAL | Status: DC | PRN

## 2018-06-26 MED ORDER — SODIUM CHLORIDE 0.9 % IV SOLN
1.0000 g | Freq: Once | INTRAVENOUS | Status: AC
  Administered 2018-06-26: 1 g via INTRAVENOUS
  Filled 2018-06-26: qty 10

## 2018-06-26 MED ORDER — INSULIN ASPART 100 UNIT/ML ~~LOC~~ SOLN: 0.0000 [IU] | Freq: Every day | SUBCUTANEOUS | Status: DC

## 2018-06-26 MED ORDER — CLOPIDOGREL BISULFATE 75 MG PO TABS
75.0000 mg | ORAL_TABLET | Freq: Every day | ORAL | Status: DC
  Administered 2018-06-27 – 2018-06-28 (×2): 75 mg via ORAL
  Filled 2018-06-26 (×3): qty 1

## 2018-06-26 MED ORDER — SODIUM CHLORIDE 0.9 % IV SOLN
INTRAVENOUS | Status: AC
  Administered 2018-06-26: 16:00:00 via INTRAVENOUS

## 2018-06-26 MED ORDER — HYDRALAZINE HCL 25 MG PO TABS
50.0000 mg | ORAL_TABLET | Freq: Three times a day (TID) | ORAL | Status: DC
  Administered 2018-06-26 – 2018-06-28 (×7): 50 mg via ORAL
  Filled 2018-06-26 (×7): qty 2

## 2018-06-26 MED ORDER — INSULIN ASPART 100 UNIT/ML ~~LOC~~ SOLN
0.0000 [IU] | Freq: Three times a day (TID) | SUBCUTANEOUS | Status: DC
  Administered 2018-06-26: 3 [IU] via SUBCUTANEOUS
  Administered 2018-06-27: 5 [IU] via SUBCUTANEOUS
  Administered 2018-06-27: 8 [IU] via SUBCUTANEOUS
  Administered 2018-06-28: 2 [IU] via SUBCUTANEOUS

## 2018-06-26 MED ORDER — ACETAMINOPHEN 325 MG PO TABS
650.0000 mg | ORAL_TABLET | Freq: Four times a day (QID) | ORAL | Status: DC | PRN
  Administered 2018-06-26: 650 mg via ORAL
  Filled 2018-06-26: qty 2

## 2018-06-26 MED ORDER — ALPRAZOLAM 0.25 MG PO TABS
0.2500 mg | ORAL_TABLET | Freq: Three times a day (TID) | ORAL | Status: DC | PRN
  Administered 2018-06-26 – 2018-06-28 (×3): 0.25 mg via ORAL
  Filled 2018-06-26 (×3): qty 1

## 2018-06-26 MED ORDER — LISINOPRIL 10 MG PO TABS
10.0000 mg | ORAL_TABLET | Freq: Every day | ORAL | Status: DC
  Administered 2018-06-27 – 2018-06-28 (×2): 10 mg via ORAL
  Filled 2018-06-26 (×3): qty 1

## 2018-06-26 MED ORDER — MEMANTINE HCL ER 28 MG PO CP24
28.0000 mg | ORAL_CAPSULE | Freq: Every day | ORAL | Status: DC
  Administered 2018-06-27 – 2018-06-28 (×2): 28 mg via ORAL
  Filled 2018-06-26 (×3): qty 1

## 2018-06-26 NOTE — H&P (Signed)
History and Physical  Katherine [name]:[id number] DOB: August 10, 1928 DOA: 06/26/2018   PCP: Celene Squibb, MD   Patient coming from: Home  Chief Complaint: altered mental status  HPI:  Katherine [name] is a 84 y.o. female with medical history of TAVR, dCHF, coronary artery disease status post CABG, diabetes mellitus type 2, hyperlipidemia, hypertension, and dementia presenting with altered mental status.  Because of the patient's dementia, she is unable to provide any history at this time.  All his history is obtained from speaking with ED staff and the patient's daughter.  The patient's daughter herself is a difficult historian.  However, according to the patient's daughter the patient has been more somnolent and a little more confused in the past week.  She went to check up on her mother earlier in the morning of 06/26/2018 and found the patient on the floor lying in her urine.  The patient was awake at that time.  EMS was activated and helped assist getting the patient output.  The patient's CBG at that time was in the 500s according to the patient's daughter.  The patient was administered 16 units of NovoLog insulin per her normal morning routine.  Because the patient's vitals were stable, EMS was sent away.  Apparently, the patient subsequently ate breakfast, but the patient's daughter noted her to be sleepy and somnolent.  Her CBG was rechecked which according to the daughter was approximately 1 to 1-1/2 hours since administration of her NovoLog insulin.  Apparently, her CBG remained significantly elevated; therefore, the daughter took it upon herself to give the patient an additional 18 units of NovoLog.  EMS was activated once again and the patient was brought to the hospital.  The patient herself denies any headache, chest pain, shortness breath, vomiting, diarrhea, abdominal pain.  The patient's daughter states that she regularly checks her mother's CBGs, and states that it has been  in the mid 100s prior to today.  In the emergency department, the patient was afebrile hemodynamically stable saturating 100% on room air.  Initial CBG upon arrival was 284.  BMP and LFTs were unremarkable.  WBC was 10.5.  Hemoglobin was 12.5.  Lactic acid initially was 2.60.  Patient was given 500 cc bolus of normal saline.  Urinalysis showed greater than 50 WBCs.  Patient was on ceftriaxone.  Assessment/Plan: Acute metabolic encephalopathy -Likely due to UTI although degree of HONK cannot be ruled out with the patient's CBG > 500 prior to arrival -UA with significant pyuria -TSH -Serum B12 -CT brain negative for acute findings -CPK 118 -judicious IVF x 12 hours  Pyuria -Continue ceftriaxone -Follow urine culture  Diabetes mellitus type 2, uncontrolled -Hemoglobin A1c -NovoLog sliding scale -At baseline, the patient receives NovoLog 16 units twice daily -The patient's daughter needs education regarding administration and dosing of insulin properly -Holding metformin  Essential hypertension -Continue lisinopril, hydralazine, nebivolol  Chronic diastolic CHF -Appears stable presently without any shortness of breath -05/27/2016 Echo EF 60 to 65%, PASP 49  Hyperlipidemia -Continue statin  Coronary artery disease status post CABG -Continue Plavix -Continue nebivolol and statin -No chest pain presently  Dementia -Continue Namenda       Past Medical History:  Diagnosis Date  . Anemia   . Anxiety   . Aortic valvular stenosis    moderate to severe  EF on 2D Echo 10/22/2012 65-70%  valve area of 1.2cm, and peak and mean gradients of 45 and 78mmHg  . Chalazion of  right upper eyelid early June 2016   treating with cream, Rx by Dr. Merlyn Albert    . CHF (congestive heart failure) ([place])   . Coronary artery disease   . Dementia   . Diabetes mellitus, type 2 ([place])   . Dyspnea    chronic  . Easy fatigability   . Gastric mass   . Gastric ulcer   . GERD (gastroesophageal  reflux disease)   . Heart murmur   . Hernia of unspecified site of abdominal cavity without mention of obstruction or gangrene    hiatal  . Hyperlipidemia   . Hypertension   . Normal cardiac stress test 12/2013   normal stress nuclear study  . Osteoarthritis   . Thyroid mass    Past Surgical History:  Procedure Laterality Date  . AORTIC VALVE SURGERY    . CATARACT EXTRACTION Bilateral   . CORONARY ARTERY BYPASS GRAFT     x4 in 2003  . internal hemorrhoidectomy    . KNEE SURGERY Right   . LEFT AND RIGHT HEART CATHETERIZATION WITH CORONARY/GRAFT ANGIOGRAM N/A 03/24/2015   Procedure: LEFT AND RIGHT HEART CATHETERIZATION WITH Beatrix Fetters;  Surgeon: Burnell Blanks, MD;  Location: [facility] CATH LAB;  Service: Cardiovascular;  Laterality: N/A;  . TEE WITHOUT CARDIOVERSION N/A 05/26/2015   Procedure: TRANSESOPHAGEAL ECHOCARDIOGRAM (TEE);  Surgeon: Burnell Blanks, MD;  Location: [facility];  Service: Open Heart Surgery;  Laterality: N/A;  . TRANSCATHETER AORTIC VALVE REPLACEMENT, TRANSFEMORAL N/A 05/26/2015   Procedure: TRANSCATHETER AORTIC VALVE REPLACEMENT, TRANSFEMORAL;  Surgeon: Burnell Blanks, MD;  Location: [facility];  Service: Open Heart Surgery;  Laterality: N/A;  . TUBAL LIGATION    . uterine polypectomy     Social History:  reports that she has never smoked. She has never used smokeless tobacco. She reports that she does not drink alcohol or use drugs.   Family History  Problem Relation Age of Onset  . Diabetes Mother   . Heart disease Mother   . Stroke Mother   . Cerebral aneurysm Mother   . Stroke Father   . Heart attack Brother   . Colon cancer Neg Hx      Allergies  Allergen Reactions  . Tape Other (See Comments)    SKIN IS VERY THIN AND WILL TEAR EASILY!!     Prior to Admission medications   Medication Sig Start Date End Date Taking? Authorizing Provider  acetaminophen (TYLENOL) 500 MG tablet Take two tablets by mouth twice daily for pain.  Do not exceed 4gm of Tylenol in 24 hours   Yes [provider]  ALPRAZolam (XANAX) 0.25 MG tablet Take 0.5 tablets (0.125 mg total) by mouth 2 (two) times daily as needed for anxiety or sleep. Patient taking differently: Take 0.25 mg by mouth 3 (three) times daily.  06/03/15  Yes Barrett, Evelene Croon, PA-C  atorvastatin (LIPITOR) 80 MG tablet Take 80 mg by mouth daily.    Yes [provider]  clopidogrel (PLAVIX) 75 MG tablet Take 1 tablet (75 mg total) by mouth daily with breakfast. 06/03/15  Yes Barrett, Evelene Croon, PA-C  fexofenadine (ALLEGRA) 180 MG tablet Take 180 mg by mouth daily.   Yes [provider]  furosemide (LASIX) 20 MG tablet TAKE 2 TABLETS BY MOUTH DAILY 05/17/17  Yes Hilty, Nadean Corwin, MD  hydrALAZINE (APRESOLINE) 50 MG tablet Take 1 tablet (50 mg total) by mouth every 8 (eight) hours. Patient taking differently: Take 50 mg by mouth every 8 (eight) hours.  For blood pressure 06/03/15  Yes Barrett, Felisa Bonier  LEVEMIR FLEXTOUCH 100 UNIT/ML Pen Use as directed 01/31/17  Yes [provider]  lisinopril (PRINIVIL,ZESTRIL) 10 MG tablet Take 1 tablet (10 mg total) by mouth daily. 06/03/15  Yes Barrett, Evelene Croon, PA-C  Memantine HCl ER (NAMENDA XR) 28 MG CP24 Take one capsule by mouth every morning for dementia   Yes [provider]  metFORMIN (GLUCOPHAGE-XR) 500 MG 24 hr tablet Take 500 mg by mouth daily with breakfast. To control blood sugar. Do not crush. 03/16/15  Yes [provider]  nebivolol (BYSTOLIC) 10 MG tablet Take one tablet by mouth every morning for blood pressure   Yes [provider]  omeprazole (PRILOSEC) 20 MG capsule Take 20 mg by mouth daily.   Yes [provider]  potassium chloride SA (K-DUR,KLOR-CON) 20 MEQ tablet Take 20 mEq by mouth daily.   Yes [provider]  VOLTAREN 1 % GEL Apply 2 g topically daily as needed (pain).  03/25/13  Yes [provider]  lactulose (CHRONULAC) 10 GM/15ML  solution Take 45 mLs (30 g total) by mouth daily as needed for mild constipation or severe constipation. 06/03/15   Barrett, Evelene Croon, PA-C  Linaclotide (LINZESS) 145 MCG CAPS capsule Take 145 mcg by mouth daily as needed (for constipation).    [provider]  loperamide (IMODIUM) 2 MG capsule Take 2 mg by mouth as needed for diarrhea or loose stools.    [provider]  meloxicam (MOBIC) 7.5 MG tablet Take 7.5 mg by mouth daily as needed for pain.  11/18/16   [provider]  NYSTATIN powder Apply 1 application topically 2 (two) times daily as needed for for breast care. 11/15/16   [provider]  traMADol (ULTRAM) 50 MG tablet Take 1-2 tablets (50-100 mg total) by mouth every 4 (four) hours as needed for moderate pain. 06/03/15   Barrett, Evelene Croon, PA-C    Review of Systems:  Limited secondary to the patient's dementia and encephalopathy Physical Exam: Vitals:   06/26/18 1100 06/26/18 1200 06/26/18 1300 06/26/18 1330  BP: (!) 151/79 (!) 160/64 (!) 164/66 (!) 172/68  Pulse: 66 64 62 60  Resp: 20 20 (!) 22 17  Temp:      TempSrc:      SpO2: 99% 100% 99% 97%  Weight:      Height:       General:  A&O x 2, NAD, nontoxic, pleasant/cooperative Head/Eye: No conjunctival hemorrhage, no icterus, [place]/AT, No nystagmus ENT:  No icterus,  No thrush, good dentition, no pharyngeal exudate Neck:  No masses, no lymphadenpathy, no bruits CV:  RRR, no rub, no gallop, no S3 Lung:  CTAB, good air movement, no wheeze, no rhonchi Abdomen: soft/NT, +BS, nondistended, no peritoneal signs Ext: No cyanosis, No rashes, No petechiae, No lymphangitis, 2 + LE edema Neuro: CNII-XII intact, strength 4/5 in bilateral upper and lower extremities, no dysmetria  Labs on Admission:  Basic Metabolic Panel: Recent Labs  Lab 06/26/18 0955  NA 138  K 4.2  CL 100  CO2 29  GLUCOSE 301*  BUN 21  CREATININE 1.16*  CALCIUM 10.3   Liver Function Tests: Recent Labs  Lab  06/26/18 0955  AST 22  ALT 18  ALKPHOS 90  BILITOT 0.5  PROT 7.2  ALBUMIN 3.9   No results for input(s): LIPASE, AMYLASE in the last 168 hours. No results for input(s): AMMONIA in the last 168 hours. CBC: Recent Labs  Lab 06/26/18 0955  WBC 10.5  NEUTROABS 7.5  HGB 12.5  HCT 36.6  MCV 83.6  PLT 282   Coagulation Profile: No results for input(s): INR, PROTIME in the last 168 hours. Cardiac Enzymes: Recent Labs  Lab 06/26/18 0956  CKTOTAL 118   BNP: Invalid input(s): POCBNP CBG: Recent Labs  Lab 06/26/18 0902 06/26/18 0947 06/26/18 1221  GLUCAP 284* 275* 206*   Urine analysis:    Component Value Date/Time   COLORURINE YELLOW 06/26/2018 0944   APPEARANCEUR CLOUDY (A) 06/26/2018 0944   LABSPEC 1.013 06/26/2018 0944   PHURINE 5.0 06/26/2018 0944   GLUCOSEU >=500 (A) 06/26/2018 0944   HGBUR NEGATIVE 06/26/2018 0944   BILIRUBINUR NEGATIVE 06/26/2018 0944   KETONESUR NEGATIVE 06/26/2018 0944   PROTEINUR NEGATIVE 06/26/2018 0944   UROBILINOGEN 0.2 02/15/2014 1403   NITRITE NEGATIVE 06/26/2018 0944   LEUKOCYTESUR LARGE (A) 06/26/2018 0944   Sepsis Labs: @LABRCNTIP (procalcitonin:4,lacticidven:4) ) Recent Results (from the past 240 hour(s))  Blood culture (routine x 2)     Status: None (Preliminary result)   Collection Time: 06/26/18 10:55 AM  Result Value Ref Range Status   Specimen Description BLOOD RIGHT ARM  Final   Special Requests   Final    BOTTLES DRAWN AEROBIC AND ANAEROBIC Blood Culture adequate volume Performed at [facility], [address] [place], [place] [address]    Culture PENDING  Incomplete   Report Status PENDING  Incomplete  Blood culture (routine x 2)     Status: None (Preliminary result)   Collection Time: 06/26/18 11:10 AM  Result Value Ref Range Status   Specimen Description BLOOD RIGHT HAND  Final   Special Requests   Final    BOTTLES DRAWN AEROBIC AND ANAEROBIC Blood Culture adequate volume Performed at [facility],  [address], [place], [place] [address]    Culture PENDING  Incomplete   Report Status PENDING  Incomplete     Radiological Exams on Admission: Dg Chest 2 View  Result Date: 06/26/2018 CLINICAL DATA:  Patient fell this morning. Hyperglycemia, weakness. History of dementia. EXAM: CHEST - 2 VIEW COMPARISON:  PA and lateral chest x-ray of March 19, 2018 FINDINGS: The patient is positioned in a somewhat lordotic fashion in a sitting position. The lungs are mildly hypo inflated. There is no focal infiltrate. No pneumothorax or pleural effusion is observed. The cardiac silhouette is enlarged but stable. There are post CABG changes. The observed bony thorax exhibits no acute abnormality. There are degenerative changes of the right shoulder. IMPRESSION: No acute cardiopulmonary abnormality. Bilateral hypoinflation. Stable enlargement of the cardiac silhouette without pulmonary vascular congestion. Electronically Signed   By: Haidar Muse  [place] M.D.   On: 06/26/2018 10:23   Ct Head Wo Contrast  Result Date: 06/26/2018 CLINICAL DATA:  Fall. EXAM: CT HEAD WITHOUT CONTRAST CT CERVICAL SPINE WITHOUT CONTRAST TECHNIQUE: Multidetector CT imaging of the head and cervical spine was performed following the standard protocol without intravenous contrast. Multiplanar CT image reconstructions of the cervical spine were also generated. COMPARISON:  CT head dated November 22, 2016. CT chest dated Apr 14, 2015. FINDINGS: CT HEAD FINDINGS Brain: No evidence of acute infarction, hemorrhage, hydrocephalus, extra-axial collection or mass lesion/mass effect. Stable moderate atrophy and chronic microvascular ischemic changes. Vascular: Calcified atherosclerosis at the skullbase. No hyperdense vessel. Skull: Negative for fracture or focal lesion. Sinuses/Orbits: No acute finding. Other: None. CT CERVICAL SPINE FINDINGS Alignment: Normal. Skull base and vertebrae: No acute fracture. No primary bone lesion or focal pathologic process.  Soft  tissues and spinal canal: No prevertebral fluid or swelling. No visible canal hematoma. Disc levels: Disc heights are relatively preserved. Small posterior disc osteophyte complex at C4-C5. Mild facet arthropathy throughout the cervical spine. Upper chest: The lung apices are clear. Enlarged, multinodular thyroid goiter, unchanged. Other: Medial deviation of the right internal carotid artery. Bilateral carotid artery calcific atherosclerosis. IMPRESSION: 1. No acute intracranial abnormality. Stable moderate atrophy and chronic microvascular ischemic changes. 2. No acute cervical spine fracture. Mild degenerative changes of the cervical spine. Electronically Signed   By: Titus Dubin M.D.   On: 06/26/2018 10:43   Ct Cervical Spine Wo Contrast  Result Date: 06/26/2018 CLINICAL DATA:  Fall. EXAM: CT HEAD WITHOUT CONTRAST CT CERVICAL SPINE WITHOUT CONTRAST TECHNIQUE: Multidetector CT imaging of the head and cervical spine was performed following the standard protocol without intravenous contrast. Multiplanar CT image reconstructions of the cervical spine were also generated. COMPARISON:  CT head dated November 22, 2016. CT chest dated Apr 14, 2015. FINDINGS: CT HEAD FINDINGS Brain: No evidence of acute infarction, hemorrhage, hydrocephalus, extra-axial collection or mass lesion/mass effect. Stable moderate atrophy and chronic microvascular ischemic changes. Vascular: Calcified atherosclerosis at the skullbase. No hyperdense vessel. Skull: Negative for fracture or focal lesion. Sinuses/Orbits: No acute finding. Other: None. CT CERVICAL SPINE FINDINGS Alignment: Normal. Skull base and vertebrae: No acute fracture. No primary bone lesion or focal pathologic process. Soft tissues and spinal canal: No prevertebral fluid or swelling. No visible canal hematoma. Disc levels: Disc heights are relatively preserved. Small posterior disc osteophyte complex at C4-C5. Mild facet arthropathy throughout the cervical spine. Upper  chest: The lung apices are clear. Enlarged, multinodular thyroid goiter, unchanged. Other: Medial deviation of the right internal carotid artery. Bilateral carotid artery calcific atherosclerosis. IMPRESSION: 1. No acute intracranial abnormality. Stable moderate atrophy and chronic microvascular ischemic changes. 2. No acute cervical spine fracture. Mild degenerative changes of the cervical spine. Electronically Signed   By: Titus Dubin M.D.   On: 06/26/2018 10:43    EKG: Independently reviewed. Sinus, nonspecific T wave changes    Time spent:60 minutes Code Status:   DNR Family Communication:  Daughter at bedside Disposition Plan: expect 2-3 day hospitalization Consults called: none DVT Prophylaxis: [place] Lovenox  Orson Eva, DO  [facility] Pager [phone]  If 7PM-7AM, please contact night-coverage [email or web address] Password [facility] 06/26/2018, 2:04 PM

## 2018-06-26 NOTE — ED Provider Notes (Signed)
Medical screening examination/treatment/procedure(s) were conducted as a shared visit with non-physician practitioner(s) and myself.  I personally evaluated the patient during the encounter.  Here with increasing confusion, weakness and falls recently.  Patient apparently does fall sometimes in the past but is been more frequent in the last week.  Also has not been quite as sharp as she normally is and is significantly more weak per the daughter that helps take care of her. My exam patient sleepy but arouses to voice.  Vital signs are within normal limits.  She does not have any bony tenderness.  No abdominal tenderness. Work-up consistent with likely UTI.  This in combination with her weakness and just generalized falling would consider overnight observation for improvement.  None     Amoreena Neubert, Corene Cornea, MD 06/29/18 [phone]

## 2018-06-26 NOTE — ED Notes (Signed)
Pt lethargic, but is alert to voice. IV attempt x1 with no success. Will wait for orders and attempt a second time

## 2018-06-26 NOTE — ED Notes (Signed)
Gave pt a diet gingerale and crackers. Notified family that she should only eat a few crackers due to coming in with hyperglycemia. Daughter states she gets angry when she doesn't eat.

## 2018-06-26 NOTE — ED Provider Notes (Signed)
[facility] EMERGENCY DEPARTMENT Provider Note   CSN: [id number] Arrival date & time: 06/26/18  3244     History   Chief Complaint Chief Complaint  Patient presents with  . Hyperglycemia  . Fall    HPI Katherine [name] is a 84 y.o. female with history of anemia, anxiety, aortic valvular stenosisstatus post TAVR, CABG x4, GERD, CHF, CAD, dementia, type 2 diabetes mellitus, GERD, hypertension, hyperlipidemia brought in by daughter for evaluation of fall and hyperglycemia.  Patient's daughter states that at around 5:30 AM this morning she walked into the patient's bedroom and noticed her in the bathroom in the left lateral decubitus position.  She states that she had urinated on herself.  She states the patient typically ambulates with a walker at baseline and she thinks that the patient did not use her walker to walk to the bathroom this morning.  She does not think that the patient was down for very long because the urine was still warm.  She is unsure if the patient had her head or not.  She then called EMS who came to assist the patient to a standing position.  She states at that time vital signs were normal.  She states that she then administered 16 units of insulin as per the patient's usual routine and gave her breakfast.  She states the patient has had a good appetite lately.  She then checked her blood sugar which was in the 500s which is very abnormal for her.  Patient's daughter then gave 18 more units of insulin.  She states that shortly thereafter the patient "acted like she was in a deep sleep ", keeping her eyes closed and difficult to arouse with verbal stimuli.  She states the patient is still quite sleepy but overall at her mental status baseline.  She states her dementia is worsening and she did sustain a fall yesterday after complaining that her knees were hurting her.  Presently the patient denies any chest pain, abdominal pain, headaches, neck pain, vision changes, numbness,  tingling, or weakness.  Daughter reports no fevers. Currently on Plavix??  The history is provided by the patient and a relative. The history is limited by the condition of the patient.    Past Medical History:  Diagnosis Date  . Anemia   . Anxiety   . Aortic valvular stenosis    moderate to severe  EF on 2D Echo 10/22/2012 65-70%  valve area of 1.2cm, and peak and mean gradients of 45 and 56mmHg  . Chalazion of right upper eyelid early June 2016   treating with cream, Rx by Dr. Merlyn Albert    . CHF (congestive heart failure) ([place])   . Coronary artery disease   . Dementia   . Diabetes mellitus, type 2 ([place])   . Dyspnea    chronic  . Easy fatigability   . Gastric mass   . Gastric ulcer   . GERD (gastroesophageal reflux disease)   . Heart murmur   . Hernia of unspecified site of abdominal cavity without mention of obstruction or gangrene    hiatal  . Hyperlipidemia   . Hypertension   . Normal cardiac stress test 12/2013   normal stress nuclear study  . Osteoarthritis   . Thyroid mass     Patient Active Problem List   Diagnosis Date Noted  . Acute metabolic encephalopathy 12/07/7251  . Urinary tract infection 06/26/2018  . Chronic diastolic CHF (congestive heart failure) ([place]) 06/26/2018  . Cough 11/23/2016  .  UTI (urinary tract infection) 11/23/2016  . Hypokalemia 11/23/2016  . Chronic diastolic heart failure ([place])   . Acute exacerbation of CHF (congestive heart failure) ([place]) 11/20/2016  . Bronchospasm 12/16/2015  . Acute bronchitis 12/16/2015  . S/p TAVR (transcatheter aortic valve replacement), bioprosthetic 09/02/2015  . Constipation 06/04/2015  . Postoperative pulmonary edema ([place]) 06/04/2015  . Severe aortic stenosis   . SOB (shortness of breath)   . Severe aortic valve stenosis 05/26/2015  . Bilateral leg edema 10/17/2014  . Acute diastolic congestive heart failure, NYHA class 3 ([place]) 10/17/2014  . CHF with left ventricular diastolic dysfunction, NYHA class 2  ([place]) 09/09/2014  . Chest pain 12/04/2013  . Difficulty walking 10/28/2013  . Muscle weakness (generalized) 10/28/2013  . Morbid obesity ([place]) 10/24/2013  . Moderate to severe aortic stenosis 04/22/2013  . S/P CABG x 4 04/22/2013  . Gastric mass 08/18/2011  . Diabetes mellitus, type 2 ([place])   . CHF 03/04/2010  . COLONIC POLYPS, HX OF 03/04/2010  . THYROID MASS 02/25/2010  . Diabetes mellitus type 2, uncontrolled ([place]) 02/25/2010  . Hyperlipidemia 02/25/2010  . UNSPECIFIED ANEMIA 02/25/2010  . Anxiety state 02/25/2010  . Essential hypertension 02/25/2010  . Coronary atherosclerosis 02/25/2010  . REFLUX ESOPHAGITIS 02/25/2010  . HIATAL HERNIA 02/25/2010  . OSTEOARTHRITIS 02/25/2010  . History of peptic ulcer disease 02/25/2010    Past Surgical History:  Procedure Laterality Date  . AORTIC VALVE SURGERY    . CATARACT EXTRACTION Bilateral   . CORONARY ARTERY BYPASS GRAFT     x4 in 2003  . internal hemorrhoidectomy    . KNEE SURGERY Right   . LEFT AND RIGHT HEART CATHETERIZATION WITH CORONARY/GRAFT ANGIOGRAM N/A 03/24/2015   Procedure: LEFT AND RIGHT HEART CATHETERIZATION WITH Beatrix Fetters;  Surgeon: Burnell Blanks, MD;  Location: [facility] CATH LAB;  Service: Cardiovascular;  Laterality: N/A;  . TEE WITHOUT CARDIOVERSION N/A 05/26/2015   Procedure: TRANSESOPHAGEAL ECHOCARDIOGRAM (TEE);  Surgeon: Burnell Blanks, MD;  Location: [place];  Service: Open Heart Surgery;  Laterality: N/A;  . TRANSCATHETER AORTIC VALVE REPLACEMENT, TRANSFEMORAL N/A 05/26/2015   Procedure: TRANSCATHETER AORTIC VALVE REPLACEMENT, TRANSFEMORAL;  Surgeon: Burnell Blanks, MD;  Location: [facility];  Service: Open Heart Surgery;  Laterality: N/A;  . TUBAL LIGATION    . uterine polypectomy       OB History   None      Home Medications    Prior to Admission medications   Medication Sig Start Date End Date Taking? Authorizing Provider  acetaminophen (TYLENOL) 500 MG tablet Take two  tablets by mouth twice daily for pain. Do not exceed 4gm of Tylenol in 24 hours   Yes [provider]  ALPRAZolam (XANAX) 0.25 MG tablet Take 0.5 tablets (0.125 mg total) by mouth 2 (two) times daily as needed for anxiety or sleep. Patient taking differently: Take 0.25 mg by mouth 3 (three) times daily.  06/03/15  Yes Barrett, Evelene Croon, PA-C  atorvastatin (LIPITOR) 80 MG tablet Take 80 mg by mouth daily.    Yes [provider]  clopidogrel (PLAVIX) 75 MG tablet Take 1 tablet (75 mg total) by mouth daily with breakfast. 06/03/15  Yes Barrett, Evelene Croon, PA-C  fexofenadine (ALLEGRA) 180 MG tablet Take 180 mg by mouth daily.   Yes [provider]  furosemide (LASIX) 20 MG tablet TAKE 2 TABLETS BY MOUTH DAILY 05/17/17  Yes Hilty, Nadean Corwin, MD  hydrALAZINE (APRESOLINE) 50 MG tablet Take 1 tablet (50 mg total) by mouth  every 8 (eight) hours. Patient taking differently: Take 50 mg by mouth every 8 (eight) hours. For blood pressure 06/03/15  Yes Barrett, Felisa Bonier  LEVEMIR FLEXTOUCH 100 UNIT/ML Pen Use as directed 01/31/17  Yes [provider]  lisinopril (PRINIVIL,ZESTRIL) 10 MG tablet Take 1 tablet (10 mg total) by mouth daily. 06/03/15  Yes Barrett, Evelene Croon, PA-C  Memantine HCl ER (NAMENDA XR) 28 MG CP24 Take one capsule by mouth every morning for dementia   Yes [provider]  metFORMIN (GLUCOPHAGE-XR) 500 MG 24 hr tablet Take 500 mg by mouth daily with breakfast. To control blood sugar. Do not crush. 03/16/15  Yes [provider]  nebivolol (BYSTOLIC) 10 MG tablet Take one tablet by mouth every morning for blood pressure   Yes [provider]  omeprazole (PRILOSEC) 20 MG capsule Take 20 mg by mouth daily.   Yes [provider]  potassium chloride SA (K-DUR,KLOR-CON) 20 MEQ tablet Take 20 mEq by mouth daily.   Yes [provider]  VOLTAREN 1 % GEL Apply 2 g topically daily as needed (pain).  03/25/13  Yes [provider]  lactulose (CHRONULAC) 10 GM/15ML solution Take 45 mLs (30 g total) by mouth daily as needed for mild constipation or severe constipation. 06/03/15   Barrett, Evelene Croon, PA-C  Linaclotide (LINZESS) 145 MCG CAPS capsule Take 145 mcg by mouth daily as needed (for constipation).    [provider]  loperamide (IMODIUM) 2 MG capsule Take 2 mg by mouth as needed for diarrhea or loose stools.    [provider]  meloxicam (MOBIC) 7.5 MG tablet Take 7.5 mg by mouth daily as needed for pain.  11/18/16   [provider]  NYSTATIN powder Apply 1 application topically 2 (two) times daily as needed for for breast care. 11/15/16   [provider]  traMADol (ULTRAM) 50 MG tablet Take 1-2 tablets (50-100 mg total) by mouth every 4 (four) hours as needed for moderate pain. 06/03/15   Barrett, Evelene Croon, PA-C    Family History Family History  Problem Relation Age of Onset  . Diabetes Mother   . Heart disease Mother   . Stroke Mother   . Cerebral aneurysm Mother   . Stroke Father   . Heart attack Brother   . Colon cancer Neg Hx     Social History Social History   Tobacco Use  . Smoking status: Never Smoker  . Smokeless tobacco: Never Used  Substance Use Topics  . Alcohol use: No  . Drug use: No     Allergies   Tape   Review of Systems Review of Systems  Unable to perform ROS: Dementia  Constitutional: Negative for fever.  Eyes: Negative for visual disturbance.  Respiratory: Negative for cough and shortness of breath.   Cardiovascular: Positive for leg swelling. Negative for chest pain.  Gastrointestinal: Negative for nausea and vomiting.  Musculoskeletal: Negative for back pain and neck pain.  Neurological: Negative for syncope (?), weakness, numbness and headaches.  Psychiatric/Behavioral: Positive for confusion.  All other systems reviewed and are negative.    Physical Exam Updated Vital Signs BP 112/82 (BP Location: Left Arm)    Pulse 64   Temp 97.8 F (36.6 C) (Oral)   Resp 16   Ht 5\' 2"  (1.575 m)   Wt 93.9 kg (207 lb)   SpO2 100%   BMI 37.86 kg/m   Physical Exam  Constitutional: She appears well-developed and well-nourished. No distress.  HENT:  Head: Normocephalic and atraumatic.  No Battle's signs, no raccoon's eyes, no rhinorrhea. No hemotympanum. No tenderness to palpation of the face or skull. No deformity, crepitus, or swelling noted.   Eyes: Pupils are equal, round, and reactive to light. Conjunctivae and EOM are normal. Right eye exhibits no discharge. Left eye exhibits no discharge.  Neck: Normal range of motion. Neck supple. No JVD present. No tracheal deviation present.  No midline spine TTP, no paraspinal muscle tenderness, no deformity, crepitus, or step-off noted   Cardiovascular: Normal rate and regular rhythm.  Murmur heard. 2/6 systolic murmur.  2+ radial pulses bilaterally.  1+ DP/PT pulses bilaterally.  There is 2+ pitting edema of the bilateral lower extremities.  Extremities are cool to the touch equally.  Patient's daughter states this is baseline.  Pulmonary/Chest: Effort normal. She exhibits no tenderness.  Bibasilar crackles, though difficult to obtain as patient has difficulty pulling herself up to a seated position  Abdominal: Soft. Bowel sounds are normal. She exhibits no distension. There is no tenderness. There is no guarding.  Musculoskeletal: She exhibits no edema.  Good grip strength and strength with plantarflexion and dorsiflexion bilaterally.  Patient has difficulty with hip flexors flexion of the hips bilaterally.  No midline spine tenderness, no deformity, crepitus, ecchymosis, or step-off noted.  Neurological: She is alert.  Sleepy but easily arousable.  Fluent speech with no evidence of dysarthria or aphasia.  No facial droop.  Sensation intact to soft touch of extremities and face.  Patient is mildly confused, oriented to person and place but not time or events.  No  pronator drift although with prolonged upward motion of the upper extremities, both upper extremities will lower. Unable to ambulate at this time.  Patient cannot pull herself up to a seated position in the bed.  Patient's daughter states this is baseline.  Skin: Skin is warm and dry. No erythema.  Psychiatric: She has a normal mood and affect. Her behavior is normal.  Nursing note and vitals reviewed.    ED Treatments / Results  Labs (all labs ordered are listed, but only abnormal results are displayed) Labs Reviewed  COMPREHENSIVE METABOLIC PANEL - Abnormal; Notable for the following components:      Result Value   Glucose, Bld 301 (*)    Creatinine, Ser 1.16 (*)    GFR calc non Af Amer 41 (*)    GFR calc Af Amer 47 (*)    All other components within normal limits  URINALYSIS, ROUTINE W REFLEX MICROSCOPIC - Abnormal; Notable for the following components:   APPearance CLOUDY (*)    Glucose, UA >=500 (*)    Leukocytes, UA LARGE (*)    WBC, UA >50 (*)    Bacteria, UA MANY (*)    All other components within normal limits  BRAIN NATRIURETIC PEPTIDE - Abnormal; Notable for the following components:   B Natriuretic Peptide 247.0 (*)    All other components within normal limits  GLUCOSE, CAPILLARY - Abnormal; Notable for the following components:   Glucose-Capillary 163 (*)    All other components within normal limits  CBG MONITORING, ED - Abnormal; Notable for the following components:   Glucose-Capillary 284 (*)    All other components within normal limits  CBG MONITORING, ED - Abnormal; Notable for the following components:   Glucose-Capillary 275 (*)    All other components within normal limits  I-STAT CG4 LACTIC ACID, ED - Abnormal; Notable for the following components:   Lactic Acid,  Venous 2.60 (*)    All other components within normal limits  CBG MONITORING, ED - Abnormal; Notable for the following components:   Glucose-Capillary 206 (*)    All other components within  normal limits  CULTURE, BLOOD (ROUTINE X 2)  CULTURE, BLOOD (ROUTINE X 2)  URINE CULTURE  CBC WITH DIFFERENTIAL/PLATELET  CK  TSH  HEMOGLOBIN A1C  VITAMIN B12  T4, FREE  BASIC METABOLIC PANEL  CBC  I-STAT TROPONIN, ED  I-STAT CG4 LACTIC ACID, ED    EKG EKG Interpretation  Date/Time:  Tuesday June 26 2018 09:55:31 EDT Ventricular Rate:  71 PR Interval:    QRS Duration: 100 QT Interval:  406 QTC Calculation: 442 R Axis:   4 Text Interpretation:  Sinus rhythm Abnormal R-wave progression, early transition Left ventricular hypertrophy Baseline wander in lead(s) V1 considering baseline wander, no obvious changes otherwise Confirmed by Merrily Pew [id number]) on 06/26/2018 11:32:27 AM   Radiology Dg Chest 2 View  Result Date: 06/26/2018 CLINICAL DATA:  Patient fell this morning. Hyperglycemia, weakness. History of dementia. EXAM: CHEST - 2 VIEW COMPARISON:  PA and lateral chest x-ray of March 19, 2018 FINDINGS: The patient is positioned in a somewhat lordotic fashion in a sitting position. The lungs are mildly hypo inflated. There is no focal infiltrate. No pneumothorax or pleural effusion is observed. The cardiac silhouette is enlarged but stable. There are post CABG changes. The observed bony thorax exhibits no acute abnormality. There are degenerative changes of the right shoulder. IMPRESSION: No acute cardiopulmonary abnormality. Bilateral hypoinflation. Stable enlargement of the cardiac silhouette without pulmonary vascular congestion. Electronically Signed   By: David  Martinique M.D.   On: 06/26/2018 10:23   Ct Head Wo Contrast  Result Date: 06/26/2018 CLINICAL DATA:  Fall. EXAM: CT HEAD WITHOUT CONTRAST CT CERVICAL SPINE WITHOUT CONTRAST TECHNIQUE: Multidetector CT imaging of the head and cervical spine was performed following the standard protocol without intravenous contrast. Multiplanar CT image reconstructions of the cervical spine were also generated. COMPARISON:  CT head dated  November 22, 2016. CT chest dated Apr 14, 2015. FINDINGS: CT HEAD FINDINGS Brain: No evidence of acute infarction, hemorrhage, hydrocephalus, extra-axial collection or mass lesion/mass effect. Stable moderate atrophy and chronic microvascular ischemic changes. Vascular: Calcified atherosclerosis at the skullbase. No hyperdense vessel. Skull: Negative for fracture or focal lesion. Sinuses/Orbits: No acute finding. Other: None. CT CERVICAL SPINE FINDINGS Alignment: Normal. Skull base and vertebrae: No acute fracture. No primary bone lesion or focal pathologic process. Soft tissues and spinal canal: No prevertebral fluid or swelling. No visible canal hematoma. Disc levels: Disc heights are relatively preserved. Small posterior disc osteophyte complex at C4-C5. Mild facet arthropathy throughout the cervical spine. Upper chest: The lung apices are clear. Enlarged, multinodular thyroid goiter, unchanged. Other: Medial deviation of the right internal carotid artery. Bilateral carotid artery calcific atherosclerosis. IMPRESSION: 1. No acute intracranial abnormality. Stable moderate atrophy and chronic microvascular ischemic changes. 2. No acute cervical spine fracture. Mild degenerative changes of the cervical spine. Electronically Signed   By: Titus Dubin M.D.   On: 06/26/2018 10:43   Ct Cervical Spine Wo Contrast  Result Date: 06/26/2018 CLINICAL DATA:  Fall. EXAM: CT HEAD WITHOUT CONTRAST CT CERVICAL SPINE WITHOUT CONTRAST TECHNIQUE: Multidetector CT imaging of the head and cervical spine was performed following the standard protocol without intravenous contrast. Multiplanar CT image reconstructions of the cervical spine were also generated. COMPARISON:  CT head dated November 22, 2016. CT chest dated Apr 14, 2015. FINDINGS: CT  HEAD FINDINGS Brain: No evidence of acute infarction, hemorrhage, hydrocephalus, extra-axial collection or mass lesion/mass effect. Stable moderate atrophy and chronic microvascular  ischemic changes. Vascular: Calcified atherosclerosis at the skullbase. No hyperdense vessel. Skull: Negative for fracture or focal lesion. Sinuses/Orbits: No acute finding. Other: None. CT CERVICAL SPINE FINDINGS Alignment: Normal. Skull base and vertebrae: No acute fracture. No primary bone lesion or focal pathologic process. Soft tissues and spinal canal: No prevertebral fluid or swelling. No visible canal hematoma. Disc levels: Disc heights are relatively preserved. Small posterior disc osteophyte complex at C4-C5. Mild facet arthropathy throughout the cervical spine. Upper chest: The lung apices are clear. Enlarged, multinodular thyroid goiter, unchanged. Other: Medial deviation of the right internal carotid artery. Bilateral carotid artery calcific atherosclerosis. IMPRESSION: 1. No acute intracranial abnormality. Stable moderate atrophy and chronic microvascular ischemic changes. 2. No acute cervical spine fracture. Mild degenerative changes of the cervical spine. Electronically Signed   By: Titus Dubin M.D.   On: 06/26/2018 10:43    Procedures Procedures (including critical care time)  Medications Ordered in ED Medications  ALPRAZolam (XANAX) tablet 0.25 mg (has no administration in time range)  atorvastatin (LIPITOR) tablet 80 mg (80 mg Oral Not Given 06/26/18 1713)  clopidogrel (PLAVIX) tablet 75 mg (has no administration in time range)  hydrALAZINE (APRESOLINE) tablet 50 mg (has no administration in time range)  lisinopril (PRINIVIL,ZESTRIL) tablet 10 mg (has no administration in time range)  memantine (NAMENDA XR) 24 hr capsule 28 mg (has no administration in time range)  nebivolol (BYSTOLIC) tablet 5 mg (has no administration in time range)  enoxaparin (LOVENOX) injection 40 mg (has no administration in time range)  acetaminophen (TYLENOL) tablet 650 mg (has no administration in time range)    Or  acetaminophen (TYLENOL) suppository 650 mg (has no administration in time range)    ondansetron (ZOFRAN) tablet 4 mg (has no administration in time range)    Or  ondansetron (ZOFRAN) injection 4 mg (has no administration in time range)  0.9 %  sodium chloride infusion ( Intravenous New Bag/Given 06/26/18 1621)  cefTRIAXone (ROCEPHIN) 1 g in sodium chloride 0.9 % 100 mL IVPB (has no administration in time range)  insulin aspart (novoLOG) injection 0-15 Units (has no administration in time range)  insulin aspart (novoLOG) injection 0-5 Units (has no administration in time range)  cefTRIAXone (ROCEPHIN) 1 g in sodium chloride 0.9 % 100 mL IVPB (0 g Intravenous Stopped 06/26/18 1201)  sodium chloride 0.9 % bolus 500 mL (0 mLs Intravenous Stopped 06/26/18 1322)     Initial Impression / Assessment and Plan / ED Course  I have reviewed the triage vital signs and the nursing notes.  Pertinent labs & imaging results that were available during my care of the patient were reviewed by me and considered in my medical decision making (see chart for details).     Patient presents for evaluation of frequent falls, worsening confusion, hyperglycemia.  She is afebrile, hypertensive while in the ED.  She is nontoxic in appearance.  She is sleepy but easily arousable, which patient's daughter states is unusual for her.  She did administer her usual 16 units of insulin and then rechecked her blood sugar after she was given her breakfast and was found to be hyperglycemic so daughter administered another 18 units.  Lab work reviewed by me shows hyperglycemia, elevated BNP of 247, mildly elevated creatinine of 1.16, and elevated lactate.  Anion gap is within normal limits, patient not in DKA.  No  leukocytosis, patient does not appear to be overtly septic at this time.  UA is consistent with UTI, we will culture.  Imaging shows no acute intracranial abnormalities or cervical spine injury.  No evidence of skull fracture.  EKG shows no obvious ischemic changes.  Patient was given IV Rocephin and small fluid  bolus with improvement in lactate.  Will require hospitalization for UTI with increased weakness and confusion as well as hyperglycemia. Spoke with Dr. Carles Collet with [facility] hospitalist service who agrees to assume care of patient and bring her into the hospital for further evaluation and management.  Patient seen and evaluated by Dr. Dayna Barker who agrees with assessment plan at this time.  Final Clinical Impressions(s) / ED Diagnoses   Final diagnoses:  Acute cystitis with hematuria  Hyperglycemia  Fall, initial encounter    ED Discharge Orders    None       Debroah Baller 06/26/18 1720    Mesner, Corene Cornea, MD 06/29/18 [phone]

## 2018-06-26 NOTE — ED Triage Notes (Signed)
EMS reports pt fell this morning and had urinated on herself.   Daughter checked her blood sugar and noticed it was over 500.  EMS was called but daughter didn't want to transport pt at that time because she gave her 16 units of insulin.  Pt ate breakfast and last cbg was 239.  Pt has history of dementia.  Denies any complaints.

## 2018-06-26 NOTE — ED Notes (Signed)
Pt back from x-ray.

## 2018-06-27 DIAGNOSIS — I5032 Chronic diastolic (congestive) heart failure: Secondary | ICD-10-CM

## 2018-06-27 DIAGNOSIS — N3001 Acute cystitis with hematuria: Secondary | ICD-10-CM

## 2018-06-27 DIAGNOSIS — Z951 Presence of aortocoronary bypass graft: Secondary | ICD-10-CM

## 2018-06-27 DIAGNOSIS — I10 Essential (primary) hypertension: Secondary | ICD-10-CM

## 2018-06-27 DIAGNOSIS — G9341 Metabolic encephalopathy: Secondary | ICD-10-CM

## 2018-06-27 LAB — GLUCOSE, CAPILLARY
GLUCOSE-CAPILLARY: 223 mg/dL — AB (ref 70–99)
Glucose-Capillary: 106 mg/dL — ABNORMAL HIGH (ref 70–99)
Glucose-Capillary: 317 mg/dL — ABNORMAL HIGH (ref 70–99)
Glucose-Capillary: 147 mg/dL — ABNORMAL HIGH (ref 70–99)

## 2018-06-27 LAB — CBC
HCT: 31.8 % — ABNORMAL LOW (ref 36.0–46.0)
HEMOGLOBIN: 10.9 g/dL — AB (ref 12.0–15.0)
MCH: 28.2 pg (ref 26.0–34.0)
MCHC: 34.3 g/dL (ref 30.0–36.0)
MCV: 82.2 fL (ref 78.0–100.0)
Platelets: 276 10*3/uL (ref 150–400)
RBC: 3.87 MIL/uL (ref 3.87–5.11)
RDW: 13.3 % (ref 11.5–15.5)
WBC: 8.7 10*3/uL (ref 4.0–10.5)

## 2018-06-27 LAB — BASIC METABOLIC PANEL
ANION GAP: 8 (ref 5–15)
BUN: 17 mg/dL (ref 8–23)
CALCIUM: 9.8 mg/dL (ref 8.9–10.3)
CO2: 27 mmol/L (ref 22–32)
Chloride: 107 mmol/L (ref 98–111)
Creatinine, Ser: 0.86 mg/dL (ref 0.44–1.00)
GFR, EST NON AFRICAN AMERICAN: 59 mL/min — AB (ref >60)
Glucose, Bld: 80 mg/dL (ref 70–99)
Potassium: 4.2 mmol/L (ref 3.5–5.1)
Sodium: 142 mmol/L (ref 135–145)

## 2018-06-27 MED ORDER — HYDROCORTISONE 2.5 % RE CREA
TOPICAL_CREAM | Freq: Four times a day (QID) | RECTAL | Status: DC | PRN
  Administered 2018-06-27: 22:00:00 via RECTAL
  Filled 2018-06-27 (×2): qty 28.35

## 2018-06-27 MED ORDER — INSULIN DETEMIR 100 UNIT/ML ~~LOC~~ SOLN
10.0000 [IU] | Freq: Every day | SUBCUTANEOUS | Status: DC
  Administered 2018-06-27: 10 [IU] via SUBCUTANEOUS
  Filled 2018-06-27 (×2): qty 0.1

## 2018-06-27 MED ORDER — LIVING WELL WITH DIABETES BOOK
Freq: Once | Status: AC
  Administered 2018-06-27: 16:00:00
  Filled 2018-06-27: qty 1

## 2018-06-27 NOTE — Progress Notes (Addendum)
PROGRESS NOTE    Katherine [name]  GBT:[id number] DOB: 07/21/1928 DOA: 06/26/2018 PCP: Celene Squibb, MD     Brief Narrative:  84 year old woman admitted from home on 7/23 due to confusion and a fall.  She has a past medical history significant for diastolic heart failure, coronary artery disease status post CABG, history of aortic stenosis status post TAVR, type 2 diabetes, hyperlipidemia, hypertension and baseline dementia.  Daughter states that patient had become increasingly confused, she went up to go to the bathroom and fell, no injury, no loss of consciousness.  She is brought to the hospital where she is found to have a UTI and admission is requested.   Assessment & Plan:   Active Problems:   Essential hypertension   S/P CABG x 4   Morbid obesity (HCC)   Acute metabolic encephalopathy   Urinary tract infection   Chronic diastolic CHF (congestive heart failure) (HCC)   Acute metabolic encephalopathy -Suspect mainly due to UTI and probably a small component of dehydration. Much improved today, daughter states she is approaching baseline-. -CT brain negative for acute findings. -TSH and B12 are within normal limits.  UTI -Continue Rocephin pending culture data.  Hypertension -Well-controlled, continue lisinopril, hydralazine, nebivolol.  Chronic diastolic heart failure -Echo from June 2017 shows an ejection fraction of 60 to 65%, currently appears stable, no shortness of breath or volume overload.  Hyperlipidemia -Continue statin.  Coronary artery disease status post CABG -Continue Plavix, continue statin, nebivolol. -Denies chest pain at present.  Type 2 diabetes -CBGs appears significantly elevated, start Lantus 10 units at bedtime, continue to follow.  Dementia -Per daughter appears at baseline. -Continue Namenda   DVT prophylaxis: Lovenox Code Status: DNR Family Communication: Daughter at bedside updated on plan of care and all questions  answered Disposition Plan: Suspect discharge home over next 24 to 48 hours  Consultants:   None  Procedures:   None  Antimicrobials:  Anti-infectives (From admission, onward)   Start     Dose/Rate Route Frequency Ordered Stop   06/27/18 1100  cefTRIAXone (ROCEPHIN) 1 g in sodium chloride 0.9 % 100 mL IVPB     1 g 200 mL/hr over 30 Minutes Intravenous Every 24 hours 06/26/18 1457     06/26/18 1115  cefTRIAXone (ROCEPHIN) 1 g in sodium chloride 0.9 % 100 mL IVPB     1 g 200 mL/hr over 30 Minutes Intravenous  Once 06/26/18 1103 06/26/18 1201       Subjective: Sitting in chair at bedside, eating lunch, no complaints.  Objective: Vitals:   06/26/18 2251 06/27/18 0551 06/27/18 0630 06/27/18 1500  BP: (!) 147/67 (!) 168/92  (!) 212/81  Pulse: 70 87 82 98  Resp:   20 (!) 22  Temp:   98.5 F (36.9 C) 98.5 F (36.9 C)  TempSrc:   Oral Oral  SpO2:   98% 98%  Weight:      Height:        Intake/Output Summary (Last 24 hours) at 06/27/2018 1626 Last data filed at 06/27/2018 1421 Gross per 24 hour  Intake 480 ml  Output 625 ml  Net -145 ml   Filed Weights   06/26/18 0904  Weight: 93.9 kg (207 lb)    Examination:  General exam: Pleasantly confused.,  Obese Respiratory system: Clear to auscultation. Respiratory effort normal. Cardiovascular system:RRR. No murmurs, rubs, gallops. Gastrointestinal system: Abdomen is nondistended, soft and nontender. No organomegaly or masses felt. Normal bowel sounds heard. Central nervous system: Alert  and oriented. No focal neurological deficits. Extremities: No C/C/E, +pedal pulses Skin: No rashes, lesions or ulcers Psychiatry: Judgement and insight appear normal. Mood & affect appropriate.     Data Reviewed: I have personally reviewed following labs and imaging studies  CBC: Recent Labs  Lab 06/26/18 0955 06/27/18 0424  WBC 10.5 8.7  NEUTROABS 7.5  --   HGB 12.5 10.9*  HCT 36.6 31.8*  MCV 83.6 82.2  PLT 282 976   Basic  Metabolic Panel: Recent Labs  Lab 06/26/18 0955 06/27/18 0424  NA 138 142  K 4.2 4.2  CL 100 107  CO2 29 27  GLUCOSE 301* 80  BUN 21 17  CREATININE 1.16* 0.86  CALCIUM 10.3 9.8   GFR: Estimated Creatinine Clearance: 48.3 mL/min (by C-G formula based on SCr of 0.86 mg/dL). Liver Function Tests: Recent Labs  Lab 06/26/18 0955  AST 22  ALT 18  ALKPHOS 90  BILITOT 0.5  PROT 7.2  ALBUMIN 3.9   No results for input(s): LIPASE, AMYLASE in the last 168 hours. No results for input(s): AMMONIA in the last 168 hours. Coagulation Profile: No results for input(s): INR, PROTIME in the last 168 hours. Cardiac Enzymes: Recent Labs  Lab 06/26/18 0956  CKTOTAL 118   BNP (last 3 results) No results for input(s): PROBNP in the last 8760 hours. HbA1C: Recent Labs    06/26/18 0952  HGBA1C 6.4*   CBG: Recent Labs  Lab 06/26/18 1221 06/26/18 1708 06/26/18 2107 06/27/18 0817 06/27/18 1134  GLUCAP 206* 163* 105* 106* 317*   Lipid Profile: No results for input(s): CHOL, HDL, LDLCALC, TRIG, CHOLHDL, LDLDIRECT in the last 72 hours. Thyroid Function Tests: Recent Labs    06/26/18 1530 06/26/18 1547  TSH 0.846  --   FREET4  --  0.94   Anemia Panel: Recent Labs    06/26/18 1547  VITAMINB12 847   Urine analysis:    Component Value Date/Time   COLORURINE YELLOW 06/26/2018 0944   APPEARANCEUR CLOUDY (A) 06/26/2018 0944   LABSPEC 1.013 06/26/2018 0944   PHURINE 5.0 06/26/2018 0944   GLUCOSEU >=500 (A) 06/26/2018 0944   HGBUR NEGATIVE 06/26/2018 0944   BILIRUBINUR NEGATIVE 06/26/2018 0944   KETONESUR NEGATIVE 06/26/2018 0944   PROTEINUR NEGATIVE 06/26/2018 0944   UROBILINOGEN 0.2 02/15/2014 1403   NITRITE NEGATIVE 06/26/2018 0944   LEUKOCYTESUR LARGE (A) 06/26/2018 0944   Sepsis Labs: @LABRCNTIP (procalcitonin:4,lacticidven:4)  ) Recent Results (from the past 240 hour(s))  Urine culture     Status: Abnormal (Preliminary result)   Collection Time: 06/26/18  9:44  AM  Result Value Ref Range Status   Specimen Description   Final    URINE, CATHETERIZED Performed at [facility], [address] [place], [place] [address]    Special Requests   Final    NONE Performed at [facility], [address], [place], [place] [address]    Culture (A)  Final    >=100,000 COLONIES/mL ESCHERICHIA COLI SUSCEPTIBILITIES TO FOLLOW Performed at [facility], [place] [address], [place],  [address]    Report Status PENDING  Incomplete  Blood culture (routine x 2)     Status: None (Preliminary result)   Collection Time: 06/26/18 10:55 AM  Result Value Ref Range Status   Specimen Description BLOOD RIGHT ARM  Final   Special Requests   Final    BOTTLES DRAWN AEROBIC AND ANAEROBIC Blood Culture adequate volume   Culture   Final    NO GROWTH < 24  HOURS Performed at [facility], [address], [place], [place] [address]    Report Status PENDING  Incomplete  Blood culture (routine x 2)     Status: None (Preliminary result)   Collection Time: 06/26/18 11:10 AM  Result Value Ref Range Status   Specimen Description BLOOD RIGHT HAND  Final   Special Requests   Final    BOTTLES DRAWN AEROBIC AND ANAEROBIC Blood Culture adequate volume   Culture   Final    NO GROWTH < 24 HOURS Performed at [facility], [address] [place], [place] [address]    Report Status PENDING  Incomplete         Radiology Studies: Dg Chest 2 View  Result Date: 06/26/2018 CLINICAL DATA:  Patient fell this morning. Hyperglycemia, weakness. History of dementia. EXAM: CHEST - 2 VIEW COMPARISON:  PA and lateral chest x-ray of March 19, 2018 FINDINGS: The patient is positioned in a somewhat lordotic fashion in a sitting position. The lungs are mildly hypo inflated. There is no focal infiltrate. No pneumothorax or pleural effusion is observed. The cardiac silhouette is enlarged but stable. There are post CABG changes. The observed bony thorax exhibits no acute abnormality. There are  degenerative changes of the right shoulder. IMPRESSION: No acute cardiopulmonary abnormality. Bilateral hypoinflation. Stable enlargement of the cardiac silhouette without pulmonary vascular congestion. Electronically Signed   By: David  Martinique M.D.   On: 06/26/2018 10:23   Ct Head Wo Contrast  Result Date: 06/26/2018 CLINICAL DATA:  Fall. EXAM: CT HEAD WITHOUT CONTRAST CT CERVICAL SPINE WITHOUT CONTRAST TECHNIQUE: Multidetector CT imaging of the head and cervical spine was performed following the standard protocol without intravenous contrast. Multiplanar CT image reconstructions of the cervical spine were also generated. COMPARISON:  CT head dated November 22, 2016. CT chest dated Apr 14, 2015. FINDINGS: CT HEAD FINDINGS Brain: No evidence of acute infarction, hemorrhage, hydrocephalus, extra-axial collection or mass lesion/mass effect. Stable moderate atrophy and chronic microvascular ischemic changes. Vascular: Calcified atherosclerosis at the skullbase. No hyperdense vessel. Skull: Negative for fracture or focal lesion. Sinuses/Orbits: No acute finding. Other: None. CT CERVICAL SPINE FINDINGS Alignment: Normal. Skull base and vertebrae: No acute fracture. No primary bone lesion or focal pathologic process. Soft tissues and spinal canal: No prevertebral fluid or swelling. No visible canal hematoma. Disc levels: Disc heights are relatively preserved. Small posterior disc osteophyte complex at C4-C5. Mild facet arthropathy throughout the cervical spine. Upper chest: The lung apices are clear. Enlarged, multinodular thyroid goiter, unchanged. Other: Medial deviation of the right internal carotid artery. Bilateral carotid artery calcific atherosclerosis. IMPRESSION: 1. No acute intracranial abnormality. Stable moderate atrophy and chronic microvascular ischemic changes. 2. No acute cervical spine fracture. Mild degenerative changes of the cervical spine. Electronically Signed   By: Titus Dubin M.D.   On:  06/26/2018 10:43   Ct Cervical Spine Wo Contrast  Result Date: 06/26/2018 CLINICAL DATA:  Fall. EXAM: CT HEAD WITHOUT CONTRAST CT CERVICAL SPINE WITHOUT CONTRAST TECHNIQUE: Multidetector CT imaging of the head and cervical spine was performed following the standard protocol without intravenous contrast. Multiplanar CT image reconstructions of the cervical spine were also generated. COMPARISON:  CT head dated November 22, 2016. CT chest dated Apr 14, 2015. FINDINGS: CT HEAD FINDINGS Brain: No evidence of acute infarction, hemorrhage, hydrocephalus, extra-axial collection or mass lesion/mass effect. Stable moderate atrophy and chronic microvascular ischemic changes. Vascular: Calcified atherosclerosis at the skullbase. No hyperdense vessel. Skull: Negative for fracture or focal lesion. Sinuses/Orbits: No acute  finding. Other: None. CT CERVICAL SPINE FINDINGS Alignment: Normal. Skull base and vertebrae: No acute fracture. No primary bone lesion or focal pathologic process. Soft tissues and spinal canal: No prevertebral fluid or swelling. No visible canal hematoma. Disc levels: Disc heights are relatively preserved. Small posterior disc osteophyte complex at C4-C5. Mild facet arthropathy throughout the cervical spine. Upper chest: The lung apices are clear. Enlarged, multinodular thyroid goiter, unchanged. Other: Medial deviation of the right internal carotid artery. Bilateral carotid artery calcific atherosclerosis. IMPRESSION: 1. No acute intracranial abnormality. Stable moderate atrophy and chronic microvascular ischemic changes. 2. No acute cervical spine fracture. Mild degenerative changes of the cervical spine. Electronically Signed   By: Titus Dubin M.D.   On: 06/26/2018 10:43        Scheduled Meds: . atorvastatin  80 mg Oral Daily  . clopidogrel  75 mg Oral Q breakfast  . enoxaparin (LOVENOX) injection  40 mg Subcutaneous Q24H  . hydrALAZINE  50 mg Oral Q8H  . insulin aspart  0-15 Units  Subcutaneous TID WC  . insulin aspart  0-5 Units Subcutaneous QHS  . lisinopril  10 mg Oral Daily  . memantine  28 mg Oral Daily  . nebivolol  5 mg Oral Daily   Continuous Infusions: . cefTRIAXone (ROCEPHIN)  IV Stopped (06/27/18 1349)     LOS: 1 day    Time spent: 35 minutes. Greater than 50% of this time was spent in direct contact with the patient and with patient's daughter, coordinating care and discussing relevant ongoing clinical issues, including etiology of her acute metabolic encephalopathy which we believe is UTI and dehydration, also plans to adjust diabetes medication mainly insulin.  We have also discussed her disposition planning, daughter states she would like to take patient back home as she has 24-hour care in the house.     Lelon Frohlich, MD [facility] Pager [phone]  If 7PM-7AM, please contact night-coverage [email or web address] Password [facility] 06/27/2018, 4:26 PM

## 2018-06-27 NOTE — Progress Notes (Signed)
Patient removed IV, currently refusing new IV. Will attempt in A.M.

## 2018-06-27 NOTE — Progress Notes (Signed)
Inpatient Diabetes Program Recommendations  AACE/ADA: New Consensus Statement on Inpatient Glycemic Control (2015)  Target Ranges:  Prepandial:   less than 140 mg/dL      Peak postprandial:   less than 180 mg/dL (1-2 hours)      Critically ill patients:  140 - 180 mg/dL   Lab Results  Component Value Date   GLUCAP 106 (H) 06/27/2018   HGBA1C 6.4 (H) 06/26/2018    Review of Glycemic Control  Diabetes history: DM2 Outpatient Diabetes medications: Levemir 16 units bid (per conversation with daughter) Current orders for Inpatient glycemic control: Novolog moderate scale tid + hs 0-5 units  Inpatient Diabetes Program Recommendations:   Spoke with patient's daughter by phone (DM Coordinator not @ campus). Daughter states she gave Levemir 16 units to her mom as usual and then another additional 16 units in 1 hr 10 minutes. Discussed with daughter that levemir is a long acting insulin and is given 12 hrs apart and if given closer together can cause hypoglycemia. Reviewed hypoglycemia protocol with daughter which daughter states she is familiar with (daughter shared she herself is a patient of endocrinologist Dr. Buddy Duty and understands hypoglycemia protocol. DM coordinator called patient's PCP office to request the pharmacist @ the office to followup regarding education needed for daughter regarding insulin administration. Ordered Living Well with Diabetes and daughter willing to review. Due to age, consider D/C Metformin.  Thank you, Nani Gasser. Audwin Semper, RN, MSN, CDE  Diabetes Coordinator Inpatient Glycemic Control Team Team Pager [phone] (8am-5pm) 06/27/2018 10:31 AM

## 2018-06-27 NOTE — Care Management Note (Signed)
Case Management Note  Patient Details  Name: Katherine [name] MRN: [id number] Date of Birth: [birth year]  Subjective/Objective: UTI. From home with daughter. Has CAP aide-11 hours per day. Has all necessary DME. In process of acquiring lift chair. Has PCP-Dr. Delphina Cahill.                    Action/Plan: DC home in care of family. No CM needs.   Expected Discharge Date:  06/29/18               Expected Discharge Plan:  Home/Self Care  In-House Referral:     Discharge planning Services  CM Consult  Post Acute Care Choice:  NA Choice offered to:  NA  DME Arranged:    DME Agency:     HH Arranged:    HH Agency:     Status of Service:  Completed, signed off  If discussed at [employer] of Stay Meetings, dates discussed:    Additional Comments:  Hager Compston, Chauncey Reading, RN 06/27/2018, 10:58 AM

## 2018-06-28 LAB — CBC
HCT: 31.1 % — ABNORMAL LOW (ref 36.0–46.0)
HEMOGLOBIN: 10.7 g/dL — AB (ref 12.0–15.0)
MCH: 28.2 pg (ref 26.0–34.0)
MCHC: 34.4 g/dL (ref 30.0–36.0)
MCV: 82.1 fL (ref 78.0–100.0)
Platelets: 255 10*3/uL (ref 150–400)
RBC: 3.79 MIL/uL — ABNORMAL LOW (ref 3.87–5.11)
RDW: 13.9 % (ref 11.5–15.5)
WBC: 7.2 10*3/uL (ref 4.0–10.5)

## 2018-06-28 LAB — BASIC METABOLIC PANEL
ANION GAP: 8 (ref 5–15)
BUN: 18 mg/dL (ref 8–23)
CALCIUM: 9.9 mg/dL (ref 8.9–10.3)
CO2: 27 mmol/L (ref 22–32)
Chloride: 105 mmol/L (ref 98–111)
Creatinine, Ser: 0.86 mg/dL (ref 0.44–1.00)
GFR calc Af Amer: >60 mL/min (ref >60)
GFR calc non Af Amer: 59 mL/min — ABNORMAL LOW (ref >60)
GLUCOSE: 135 mg/dL — AB (ref 70–99)
Potassium: 3.7 mmol/L (ref 3.5–5.1)
Sodium: 140 mmol/L (ref 135–145)

## 2018-06-28 LAB — URINE CULTURE: Culture: >=100000 — AB

## 2018-06-28 LAB — GLUCOSE, CAPILLARY
GLUCOSE-CAPILLARY: 121 mg/dL — AB (ref 70–99)
Glucose-Capillary: 111 mg/dL — ABNORMAL HIGH (ref 70–99)

## 2018-06-28 MED ORDER — CIPROFLOXACIN HCL 250 MG PO TABS: 250.0000 mg | ORAL_TABLET | Freq: Two times a day (BID) | ORAL | 0 refills | Status: AC

## 2018-06-28 MED ORDER — CIPROFLOXACIN HCL 250 MG PO TABS
250.0000 mg | ORAL_TABLET | Freq: Two times a day (BID) | ORAL | Status: DC
  Administered 2018-06-28: 250 mg via ORAL
  Filled 2018-06-28: qty 1

## 2018-06-28 MED ORDER — INSULIN DETEMIR 100 UNIT/ML ~~LOC~~ SOLN: 10.0000 [IU] | Freq: Every day | SUBCUTANEOUS | 11 refills | Status: DC

## 2018-06-28 NOTE — Discharge Summary (Signed)
Physician Discharge Summary  Katherine [name] YBO:[id number] DOB: 05/07/1928 DOA: 06/26/2018  PCP: Celene Squibb, MD  Admit date: 06/26/2018 Discharge date: 06/28/2018  Time spent: 45 minutes  Recommendations for Outpatient Follow-up:  -Will be discharged home today. -Advised to follow up with PCP in 2 weeks.   Discharge Diagnoses:  Active Problems:   Essential hypertension   S/P CABG x 4   Morbid obesity (HCC)   Acute metabolic encephalopathy   Urinary tract infection   Chronic diastolic CHF (congestive heart failure) ([place])   Discharge Condition: Stable and improved   Filed Weights   06/26/18 0904  Weight: 93.9 kg (207 lb)    History of present illness:  As per Dr. Carles Collet on 7/23: Katherine [name] is a 84 y.o. female with medical history of TAVR, dCHF, coronary artery disease status post CABG, diabetes mellitus type 2, hyperlipidemia, hypertension, and dementia presenting with altered mental status.  Because of the patient's dementia, she is unable to provide any history at this time.  All his history is obtained from speaking with ED staff and the patient's daughter.  The patient's daughter herself is a difficult historian.  However, according to the patient's daughter the patient has been more somnolent and a little more confused in the past week.  She went to check up on her mother earlier in the morning of 06/26/2018 and found the patient on the floor lying in her urine.  The patient was awake at that time.  EMS was activated and helped assist getting the patient output.  The patient's CBG at that time was in the 500s according to the patient's daughter.  The patient was administered 16 units of NovoLog insulin per her normal morning routine.  Because the patient's vitals were stable, EMS was sent away.  Apparently, the patient subsequently ate breakfast, but the patient's daughter noted her to be sleepy and somnolent.  Her CBG was rechecked which according to the daughter  was approximately 1 to 1-1/2 hours since administration of her NovoLog insulin.  Apparently, her CBG remained significantly elevated; therefore, the daughter took it upon herself to give the patient an additional 18 units of NovoLog.  EMS was activated once again and the patient was brought to the hospital.  The patient herself denies any headache, chest pain, shortness breath, vomiting, diarrhea, abdominal pain.  The patient's daughter states that she regularly checks her mother's CBGs, and states that it has been in the mid 100s prior to today.  In the emergency department, the patient was afebrile hemodynamically stable saturating 100% on room air.  Initial CBG upon arrival was 284.  BMP and LFTs were unremarkable.  WBC was 10.5.  Hemoglobin was 12.5.  Lactic acid initially was 2.60.  Patient was given 500 cc bolus of normal saline.  Urinalysis showed greater than 50 WBCs.  Patient was on ceftriaxone.    Hospital Course:   Acute metabolic encephalopathy -Suspect mainly due to UTI and probably a small component of dehydration. -Per daughter, she is at baseline today. -CT brain negative for acute findings. -TSH and B12 are within normal limits.  UTI -Pansensitive E coli. -Cipro 250 mg BID for 5 more days.  Hypertension -Well-controlled, continue lisinopril, hydralazine, nebivolol.  Chronic diastolic heart failure -Echo from June 2017 shows an ejection fraction of 60 to 65%, currently appears stable, no shortness of breath or volume overload.  Hyperlipidemia -Continue statin.  Coronary artery disease status post CABG -Continue Plavix, continue statin, nebivolol. -Denies  chest pain at present.  Type 2 diabetes -Was started on Lantus. Improved. -Will need continued OP follow up.  Dementia -Per daughter appears at baseline. -Continue Namenda     Procedures:  None   Consultations:  None  Discharge Instructions  Discharge Instructions    Care order/instruction    Complete by:  As directed    DC after lunch   Diet - low sodium heart healthy   Complete by:  As directed    Increase activity slowly   Complete by:  As directed      Allergies as of 06/28/2018      Reactions   Tape Other (See Comments)   SKIN IS VERY THIN AND WILL TEAR EASILY!!      Medication List    STOP taking these medications   LEVEMIR FLEXTOUCH 100 UNIT/ML Pen Generic drug:  Insulin Detemir Replaced by:  insulin detemir 100 UNIT/ML injection     TAKE these medications   acetaminophen 500 MG tablet Commonly known as:  TYLENOL Take two tablets by mouth twice daily for pain. Do not exceed 4gm of Tylenol in 24 hours   ALPRAZolam 0.25 MG tablet Commonly known as:  XANAX Take 0.5 tablets (0.125 mg total) by mouth 2 (two) times daily as needed for anxiety or sleep. What changed:    how much to take  when to take this   atorvastatin 80 MG tablet Commonly known as:  LIPITOR Take 80 mg by mouth daily.   ciprofloxacin 250 MG tablet Commonly known as:  CIPRO Take 1 tablet (250 mg total) by mouth 2 (two) times daily for 5 days.   clopidogrel 75 MG tablet Commonly known as:  PLAVIX Take 1 tablet (75 mg total) by mouth daily with breakfast.   fexofenadine 180 MG tablet Commonly known as:  ALLEGRA Take 180 mg by mouth daily.   furosemide 20 MG tablet Commonly known as:  LASIX TAKE 2 TABLETS BY MOUTH DAILY   hydrALAZINE 50 MG tablet Commonly known as:  APRESOLINE Take 1 tablet (50 mg total) by mouth every 8 (eight) hours. What changed:  additional instructions   insulin detemir 100 UNIT/ML injection Commonly known as:  LEVEMIR Inject 0.1 mLs (10 Units total) into the skin at bedtime. Replaces:  LEVEMIR FLEXTOUCH 100 UNIT/ML Pen   lactulose 10 GM/15ML solution Commonly known as:  CHRONULAC Take 45 mLs (30 g total) by mouth daily as needed for mild constipation or severe constipation.   LINZESS 145 MCG Caps capsule Generic drug:  linaclotide Take 145 mcg  by mouth daily as needed (for constipation).   lisinopril 10 MG tablet Commonly known as:  PRINIVIL,ZESTRIL Take 1 tablet (10 mg total) by mouth daily.   loperamide 2 MG capsule Commonly known as:  IMODIUM Take 2 mg by mouth as needed for diarrhea or loose stools.   meloxicam 7.5 MG tablet Commonly known as:  MOBIC Take 7.5 mg by mouth daily as needed for pain.   metFORMIN 500 MG 24 hr tablet Commonly known as:  GLUCOPHAGE-XR Take 500 mg by mouth daily with breakfast. To control blood sugar. Do not crush.   NAMENDA XR 28 MG Cp24 24 hr capsule Generic drug:  memantine Take one capsule by mouth every morning for dementia   nebivolol 10 MG tablet Commonly known as:  BYSTOLIC Take one tablet by mouth every morning for blood pressure   nystatin powder Generic drug:  nystatin Apply 1 application topically 2 (two) times daily as needed for for  breast care.   omeprazole 20 MG capsule Commonly known as:  PRILOSEC Take 20 mg by mouth daily.   potassium chloride SA 20 MEQ tablet Commonly known as:  K-DUR,KLOR-CON Take 20 mEq by mouth daily.   traMADol 50 MG tablet Commonly known as:  ULTRAM Take 1-2 tablets (50-100 mg total) by mouth every 4 (four) hours as needed for moderate pain.   VOLTAREN 1 % Gel Generic drug:  diclofenac sodium Apply 2 g topically daily as needed (pain).      Allergies  Allergen Reactions  . Tape Other (See Comments)    SKIN IS VERY THIN AND WILL TEAR EASILY!!   Follow-up Information    Celene Squibb, MD. Schedule an appointment as soon as possible for a visit in 2 week(s).   Specialty:  Internal Medicine Contact information: [place] [address] [phone]            The results of significant diagnostics from this hospitalization (including imaging, microbiology, ancillary and laboratory) are listed below for reference.    Significant Diagnostic Studies: Dg Chest 2 View  Result Date: 06/26/2018 CLINICAL DATA:   Patient fell this morning. Hyperglycemia, weakness. History of dementia. EXAM: CHEST - 2 VIEW COMPARISON:  PA and lateral chest x-ray of March 19, 2018 FINDINGS: The patient is positioned in a somewhat lordotic fashion in a sitting position. The lungs are mildly hypo inflated. There is no focal infiltrate. No pneumothorax or pleural effusion is observed. The cardiac silhouette is enlarged but stable. There are post CABG changes. The observed bony thorax exhibits no acute abnormality. There are degenerative changes of the right shoulder. IMPRESSION: No acute cardiopulmonary abnormality. Bilateral hypoinflation. Stable enlargement of the cardiac silhouette without pulmonary vascular congestion. Electronically Signed   By: David  Martinique M.D.   On: 06/26/2018 10:23   Ct Head Wo Contrast  Result Date: 06/26/2018 CLINICAL DATA:  Fall. EXAM: CT HEAD WITHOUT CONTRAST CT CERVICAL SPINE WITHOUT CONTRAST TECHNIQUE: Multidetector CT imaging of the head and cervical spine was performed following the standard protocol without intravenous contrast. Multiplanar CT image reconstructions of the cervical spine were also generated. COMPARISON:  CT head dated November 22, 2016. CT chest dated Apr 14, 2015. FINDINGS: CT HEAD FINDINGS Brain: No evidence of acute infarction, hemorrhage, hydrocephalus, extra-axial collection or mass lesion/mass effect. Stable moderate atrophy and chronic microvascular ischemic changes. Vascular: Calcified atherosclerosis at the skullbase. No hyperdense vessel. Skull: Negative for fracture or focal lesion. Sinuses/Orbits: No acute finding. Other: None. CT CERVICAL SPINE FINDINGS Alignment: Normal. Skull base and vertebrae: No acute fracture. No primary bone lesion or focal pathologic process. Soft tissues and spinal canal: No prevertebral fluid or swelling. No visible canal hematoma. Disc levels: Disc heights are relatively preserved. Small posterior disc osteophyte complex at C4-C5. Mild facet  arthropathy throughout the cervical spine. Upper chest: The lung apices are clear. Enlarged, multinodular thyroid goiter, unchanged. Other: Medial deviation of the right internal carotid artery. Bilateral carotid artery calcific atherosclerosis. IMPRESSION: 1. No acute intracranial abnormality. Stable moderate atrophy and chronic microvascular ischemic changes. 2. No acute cervical spine fracture. Mild degenerative changes of the cervical spine. Electronically Signed   By: Titus Dubin M.D.   On: 06/26/2018 10:43   Ct Cervical Spine Wo Contrast  Result Date: 06/26/2018 CLINICAL DATA:  Fall. EXAM: CT HEAD WITHOUT CONTRAST CT CERVICAL SPINE WITHOUT CONTRAST TECHNIQUE: Multidetector CT imaging of the head and cervical spine was performed following the standard protocol without intravenous contrast.  Multiplanar CT image reconstructions of the cervical spine were also generated. COMPARISON:  CT head dated November 22, 2016. CT chest dated Apr 14, 2015. FINDINGS: CT HEAD FINDINGS Brain: No evidence of acute infarction, hemorrhage, hydrocephalus, extra-axial collection or mass lesion/mass effect. Stable moderate atrophy and chronic microvascular ischemic changes. Vascular: Calcified atherosclerosis at the skullbase. No hyperdense vessel. Skull: Negative for fracture or focal lesion. Sinuses/Orbits: No acute finding. Other: None. CT CERVICAL SPINE FINDINGS Alignment: Normal. Skull base and vertebrae: No acute fracture. No primary bone lesion or focal pathologic process. Soft tissues and spinal canal: No prevertebral fluid or swelling. No visible canal hematoma. Disc levels: Disc heights are relatively preserved. Small posterior disc osteophyte complex at C4-C5. Mild facet arthropathy throughout the cervical spine. Upper chest: The lung apices are clear. Enlarged, multinodular thyroid goiter, unchanged. Other: Medial deviation of the right internal carotid artery. Bilateral carotid artery calcific atherosclerosis.  IMPRESSION: 1. No acute intracranial abnormality. Stable moderate atrophy and chronic microvascular ischemic changes. 2. No acute cervical spine fracture. Mild degenerative changes of the cervical spine. Electronically Signed   By: Titus Dubin M.D.   On: 06/26/2018 10:43    Microbiology: Recent Results (from the past 240 hour(s))  Urine culture     Status: Abnormal   Collection Time: 06/26/18  9:44 AM  Result Value Ref Range Status   Specimen Description   Final    URINE, CATHETERIZED Performed at [facility], [address], [place], [place] [address]    Special Requests   Final    NONE Performed at [facility], [address], [place], [place] [address]    Culture >=100,000 COLONIES/mL ESCHERICHIA COLI (A)  Final   Report Status 06/28/2018 FINAL  Final   Organism ID, Bacteria ESCHERICHIA COLI (A)  Final      Susceptibility   Escherichia coli - MIC*    AMPICILLIN <=2 SENSITIVE Sensitive     CEFAZOLIN <=4 SENSITIVE Sensitive     CEFTRIAXONE <=1 SENSITIVE Sensitive     CIPROFLOXACIN <=0.25 SENSITIVE Sensitive     GENTAMICIN <=1 SENSITIVE Sensitive     IMIPENEM <=0.25 SENSITIVE Sensitive     NITROFURANTOIN 32 SENSITIVE Sensitive     TRIMETH/SULFA <=20 SENSITIVE Sensitive     AMPICILLIN/SULBACTAM <=2 SENSITIVE Sensitive     Extended ESBL NEGATIVE Sensitive     * >=100,000 COLONIES/mL ESCHERICHIA COLI  Blood culture (routine x 2)     Status: None (Preliminary result)   Collection Time: 06/26/18 10:55 AM  Result Value Ref Range Status   Specimen Description BLOOD RIGHT ARM  Final   Special Requests   Final    BOTTLES DRAWN AEROBIC AND ANAEROBIC Blood Culture adequate volume   Culture   Final    NO GROWTH 2 DAYS Performed at [facility], [address] [place], [place] [address]    Report Status PENDING  Incomplete  Blood culture (routine x 2)     Status: None (Preliminary result)   Collection Time: 06/26/18 11:10 AM  Result Value Ref Range Status   Specimen Description  BLOOD RIGHT HAND  Final   Special Requests   Final    BOTTLES DRAWN AEROBIC AND ANAEROBIC Blood Culture adequate volume   Culture   Final    NO GROWTH 2 DAYS Performed at [facility], [address] [place], [place] [address]    Report Status PENDING  Incomplete     Labs: Basic Metabolic Panel: Recent Labs  Lab 06/26/18 0955 06/27/18 0424 06/28/18 0507  NA 138 142 140  K 4.2 4.2 3.7  CL 100 107 105  CO2 29 27 27   GLUCOSE 301* 80 135*  BUN 21 17 18   CREATININE 1.16* 0.86 0.86  CALCIUM 10.3 9.8 9.9   Liver Function Tests: Recent Labs  Lab 06/26/18 0955  AST 22  ALT 18  ALKPHOS 90  BILITOT 0.5  PROT 7.2  ALBUMIN 3.9   No results for input(s): LIPASE, AMYLASE in the last 168 hours. No results for input(s): AMMONIA in the last 168 hours. CBC: Recent Labs  Lab 06/26/18 0955 06/27/18 0424 06/28/18 0507  WBC 10.5 8.7 7.2  NEUTROABS 7.5  --   --   HGB 12.5 10.9* 10.7*  HCT 36.6 31.8* 31.1*  MCV 83.6 82.2 82.1  PLT 282 276 255   Cardiac Enzymes: Recent Labs  Lab 06/26/18 0956  CKTOTAL 118   BNP: BNP (last 3 results) Recent Labs    03/19/18 0702 06/26/18 0955  BNP 148.0* 247.0*    ProBNP (last 3 results) No results for input(s): PROBNP in the last 8760 hours.  CBG: Recent Labs  Lab 06/27/18 0817 06/27/18 1134 06/27/18 1632 06/27/18 2038 06/28/18 0802  GLUCAP 106* 317* 223* 147* 121*       Signed:  [facility] Pager: [phone] 06/28/2018, 11:38 AM

## 2018-06-28 NOTE — Progress Notes (Signed)
IV removed, 2x2 gauze and paper tape applied to site, patient tolerated well.  Reviewed AVS with patient's daughter, JAZZLYNN RAWE, who verbalized understanding. Patient to be transported by daughter.

## 2018-06-29 ENCOUNTER — Other Ambulatory Visit: Payer: Self-pay | Admitting: Internal Medicine

## 2018-06-29 NOTE — Telephone Encounter (Signed)
Rx request sent to pharmacy.  

## 2018-07-01 LAB — CULTURE, BLOOD (ROUTINE X 2)
Culture: NO GROWTH
Culture: NO GROWTH
SPECIAL REQUESTS: ADEQUATE
Special Requests: ADEQUATE

## 2018-07-02 NOTE — ED Notes (Signed)
CRITICAL VALUE ALERT  Critical Value:  Lactic Acid 2.60  Date & Time Notied:  06/26/2018 1016   Provider Notified: Yes    Orders Received/Actions taken: No orders yet

## 2018-07-05 DIAGNOSIS — I5042 Chronic combined systolic (congestive) and diastolic (congestive) heart failure: Secondary | ICD-10-CM | POA: Diagnosis not present

## 2018-07-05 DIAGNOSIS — F411 Generalized anxiety disorder: Secondary | ICD-10-CM | POA: Diagnosis not present

## 2018-07-05 DIAGNOSIS — F0390 Unspecified dementia without behavioral disturbance: Secondary | ICD-10-CM | POA: Diagnosis not present

## 2018-07-05 DIAGNOSIS — N390 Urinary tract infection, site not specified: Secondary | ICD-10-CM | POA: Diagnosis not present

## 2018-07-05 DIAGNOSIS — I700 Atherosclerosis of aorta: Secondary | ICD-10-CM | POA: Diagnosis not present

## 2018-07-05 DIAGNOSIS — E782 Mixed hyperlipidemia: Secondary | ICD-10-CM | POA: Diagnosis not present

## 2018-07-05 DIAGNOSIS — R600 Localized edema: Secondary | ICD-10-CM | POA: Diagnosis not present

## 2018-07-05 DIAGNOSIS — D509 Iron deficiency anemia, unspecified: Secondary | ICD-10-CM | POA: Diagnosis not present

## 2018-07-05 DIAGNOSIS — E1165 Type 2 diabetes mellitus with hyperglycemia: Secondary | ICD-10-CM | POA: Diagnosis not present

## 2018-07-05 DIAGNOSIS — N2889 Other specified disorders of kidney and ureter: Secondary | ICD-10-CM | POA: Diagnosis not present

## 2018-07-05 DIAGNOSIS — I10 Essential (primary) hypertension: Secondary | ICD-10-CM | POA: Diagnosis not present

## 2018-07-05 DIAGNOSIS — E119 Type 2 diabetes mellitus without complications: Secondary | ICD-10-CM | POA: Diagnosis not present

## 2018-07-05 DIAGNOSIS — E870 Hyperosmolality and hypernatremia: Secondary | ICD-10-CM | POA: Diagnosis not present

## 2018-07-06 ENCOUNTER — Other Ambulatory Visit: Payer: Self-pay

## 2018-07-15 ENCOUNTER — Other Ambulatory Visit: Payer: Self-pay | Admitting: Internal Medicine

## 2018-07-16 DIAGNOSIS — K2970 Gastritis, unspecified, without bleeding: Secondary | ICD-10-CM | POA: Diagnosis not present

## 2018-07-16 DIAGNOSIS — E1165 Type 2 diabetes mellitus with hyperglycemia: Secondary | ICD-10-CM | POA: Diagnosis not present

## 2018-07-16 DIAGNOSIS — N390 Urinary tract infection, site not specified: Secondary | ICD-10-CM | POA: Diagnosis not present

## 2018-07-16 DIAGNOSIS — Z6835 Body mass index (BMI) 35.0-35.9, adult: Secondary | ICD-10-CM | POA: Diagnosis not present

## 2018-07-18 DIAGNOSIS — R3981 Functional urinary incontinence: Secondary | ICD-10-CM | POA: Diagnosis not present

## 2018-07-18 DIAGNOSIS — E782 Mixed hyperlipidemia: Secondary | ICD-10-CM | POA: Diagnosis not present

## 2018-07-18 DIAGNOSIS — N2889 Other specified disorders of kidney and ureter: Secondary | ICD-10-CM | POA: Diagnosis not present

## 2018-07-18 DIAGNOSIS — E119 Type 2 diabetes mellitus without complications: Secondary | ICD-10-CM | POA: Diagnosis not present

## 2018-07-18 DIAGNOSIS — D509 Iron deficiency anemia, unspecified: Secondary | ICD-10-CM | POA: Diagnosis not present

## 2018-07-18 DIAGNOSIS — E870 Hyperosmolality and hypernatremia: Secondary | ICD-10-CM | POA: Diagnosis not present

## 2018-07-18 DIAGNOSIS — I5042 Chronic combined systolic (congestive) and diastolic (congestive) heart failure: Secondary | ICD-10-CM | POA: Diagnosis not present

## 2018-07-18 DIAGNOSIS — I700 Atherosclerosis of aorta: Secondary | ICD-10-CM | POA: Diagnosis not present

## 2018-07-18 DIAGNOSIS — I10 Essential (primary) hypertension: Secondary | ICD-10-CM | POA: Diagnosis not present

## 2018-07-18 DIAGNOSIS — F0390 Unspecified dementia without behavioral disturbance: Secondary | ICD-10-CM | POA: Diagnosis not present

## 2018-07-18 DIAGNOSIS — F411 Generalized anxiety disorder: Secondary | ICD-10-CM | POA: Diagnosis not present

## 2018-07-18 DIAGNOSIS — R600 Localized edema: Secondary | ICD-10-CM | POA: Diagnosis not present

## 2018-08-15 ENCOUNTER — Ambulatory Visit (INDEPENDENT_AMBULATORY_CARE_PROVIDER_SITE_OTHER): Payer: Medicare Other | Admitting: Podiatry

## 2018-08-15 ENCOUNTER — Encounter: Payer: Self-pay | Admitting: Podiatry

## 2018-08-15 DIAGNOSIS — M79675 Pain in left toe(s): Secondary | ICD-10-CM

## 2018-08-15 DIAGNOSIS — B351 Tinea unguium: Secondary | ICD-10-CM

## 2018-08-15 DIAGNOSIS — M2010 Hallux valgus (acquired), unspecified foot: Secondary | ICD-10-CM | POA: Diagnosis not present

## 2018-08-15 DIAGNOSIS — M79674 Pain in right toe(s): Secondary | ICD-10-CM

## 2018-08-15 DIAGNOSIS — E1140 Type 2 diabetes mellitus with diabetic neuropathy, unspecified: Secondary | ICD-10-CM

## 2018-08-15 DIAGNOSIS — D689 Coagulation defect, unspecified: Secondary | ICD-10-CM | POA: Diagnosis not present

## 2018-08-15 NOTE — Progress Notes (Signed)
Patient ID: Katherine [name], female   DOB: 01/15/1928, 84 y.o.   MRN: [id number] Complaint:  Visit Type: Patient returns to my office for continued preventative foot care services. Complaint: Patient states" my nails have grown long and thick and become painful to walk and wear shoes" Patient has been diagnosed with DM with no foot complications. The patient presents for preventative foot care services. No changes to ROS.  Patient is taking plavix.  Podiatric Exam: Vascular: dorsalis pedis and posterior tibial pulses are not  palpable bilateral due to foot swelling.. Capillary return is immediate. Temperature gradient is WNL. Skin turgor WNL  Sensorium: Diminished Semmes Weinstein monofilament test. Normal tactile sensation bilaterally. Nail Exam: Pt has thick disfigured discolored nails with subungual debris noted bilateral entire nail hallux through fifth toenails Ulcer Exam: There is no evidence of ulcer or pre-ulcerative changes or infection. Orthopedic Exam: Muscle tone and strength are WNL. No limitations in general ROM. No crepitus or effusions noted. Foot type and digits show no abnormalities. Bony prominences are unremarkable. HAV deformity B/L Skin: No Porokeratosis. No infection or ulcers  Diagnosis:  Onychomycosis, , Pain in right toe, pain in left toes Diabetes with neuropathy. HAV  B/L,  Angiopathy  Treatment & Plan Procedures and Treatment: Consent by patient was obtained for treatment procedures. The patient understood the discussion of treatment and procedures well. All questions were answered thoroughly reviewed. Debridement of mycotic and hypertrophic toenails, 1 through 5 bilateral and clearing of subungual debris. No ulceration, no infection noted.  ABN signed for 2019. Dispense diabetic shoes.  Patient presents today and was dispensed 0ne pair ( two units) of medically necessary extra depth shoes with three pair( six units) of custom molded multiple density inserts. The  shoes and the inserts are fitted to the patients ' feet and are noted to fit well and are free of defect.  Length and width of the shoes are also acceptable.  Patient was given written and verbal  instructions for wearing.  If any concerns arrive with the shoes or inserts, the patient is to call the office.Patient is to follow up with doctor in six weeks. Return Visit-Office Procedure: Patient instructed to return to the office for a follow up visit 3 months for continued evaluation and treatment.   Gardiner Barefoot DPM

## 2018-08-23 DIAGNOSIS — Z6835 Body mass index (BMI) 35.0-35.9, adult: Secondary | ICD-10-CM | POA: Diagnosis not present

## 2018-08-23 DIAGNOSIS — F0390 Unspecified dementia without behavioral disturbance: Secondary | ICD-10-CM | POA: Diagnosis not present

## 2018-08-23 DIAGNOSIS — R3981 Functional urinary incontinence: Secondary | ICD-10-CM | POA: Diagnosis not present

## 2018-08-23 DIAGNOSIS — N2889 Other specified disorders of kidney and ureter: Secondary | ICD-10-CM | POA: Diagnosis not present

## 2018-08-23 DIAGNOSIS — R197 Diarrhea, unspecified: Secondary | ICD-10-CM | POA: Diagnosis not present

## 2018-09-07 DIAGNOSIS — N2889 Other specified disorders of kidney and ureter: Secondary | ICD-10-CM | POA: Diagnosis not present

## 2018-09-07 DIAGNOSIS — D3001 Benign neoplasm of right kidney: Secondary | ICD-10-CM | POA: Diagnosis not present

## 2018-09-24 DIAGNOSIS — Z87440 Personal history of urinary (tract) infections: Secondary | ICD-10-CM | POA: Diagnosis not present

## 2018-09-24 DIAGNOSIS — Z794 Long term (current) use of insulin: Secondary | ICD-10-CM | POA: Diagnosis not present

## 2018-09-24 DIAGNOSIS — I110 Hypertensive heart disease with heart failure: Secondary | ICD-10-CM | POA: Diagnosis not present

## 2018-09-24 DIAGNOSIS — Z7902 Long term (current) use of antithrombotics/antiplatelets: Secondary | ICD-10-CM | POA: Diagnosis not present

## 2018-09-24 DIAGNOSIS — R2689 Other abnormalities of gait and mobility: Secondary | ICD-10-CM | POA: Diagnosis not present

## 2018-09-24 DIAGNOSIS — F0390 Unspecified dementia without behavioral disturbance: Secondary | ICD-10-CM | POA: Diagnosis not present

## 2018-09-24 DIAGNOSIS — E119 Type 2 diabetes mellitus without complications: Secondary | ICD-10-CM | POA: Diagnosis not present

## 2018-09-24 DIAGNOSIS — M25562 Pain in left knee: Secondary | ICD-10-CM | POA: Diagnosis not present

## 2018-09-24 DIAGNOSIS — Z79899 Other long term (current) drug therapy: Secondary | ICD-10-CM | POA: Diagnosis not present

## 2018-09-24 DIAGNOSIS — M25561 Pain in right knee: Secondary | ICD-10-CM | POA: Diagnosis not present

## 2018-09-24 DIAGNOSIS — N2889 Other specified disorders of kidney and ureter: Secondary | ICD-10-CM | POA: Diagnosis not present

## 2018-09-24 DIAGNOSIS — D509 Iron deficiency anemia, unspecified: Secondary | ICD-10-CM | POA: Diagnosis not present

## 2018-09-24 DIAGNOSIS — I5042 Chronic combined systolic (congestive) and diastolic (congestive) heart failure: Secondary | ICD-10-CM | POA: Diagnosis not present

## 2018-09-24 DIAGNOSIS — F411 Generalized anxiety disorder: Secondary | ICD-10-CM | POA: Diagnosis not present

## 2018-09-24 DIAGNOSIS — I700 Atherosclerosis of aorta: Secondary | ICD-10-CM | POA: Diagnosis not present

## 2018-09-25 DIAGNOSIS — D3001 Benign neoplasm of right kidney: Secondary | ICD-10-CM | POA: Diagnosis not present

## 2018-09-27 DIAGNOSIS — E119 Type 2 diabetes mellitus without complications: Secondary | ICD-10-CM | POA: Diagnosis not present

## 2018-09-27 DIAGNOSIS — F0390 Unspecified dementia without behavioral disturbance: Secondary | ICD-10-CM | POA: Diagnosis not present

## 2018-09-27 DIAGNOSIS — R2689 Other abnormalities of gait and mobility: Secondary | ICD-10-CM | POA: Diagnosis not present

## 2018-09-27 DIAGNOSIS — M25562 Pain in left knee: Secondary | ICD-10-CM | POA: Diagnosis not present

## 2018-09-27 DIAGNOSIS — M25561 Pain in right knee: Secondary | ICD-10-CM | POA: Diagnosis not present

## 2018-09-27 DIAGNOSIS — I110 Hypertensive heart disease with heart failure: Secondary | ICD-10-CM | POA: Diagnosis not present

## 2018-10-01 DIAGNOSIS — E119 Type 2 diabetes mellitus without complications: Secondary | ICD-10-CM | POA: Diagnosis not present

## 2018-10-01 DIAGNOSIS — M25561 Pain in right knee: Secondary | ICD-10-CM | POA: Diagnosis not present

## 2018-10-01 DIAGNOSIS — F0390 Unspecified dementia without behavioral disturbance: Secondary | ICD-10-CM | POA: Diagnosis not present

## 2018-10-01 DIAGNOSIS — M25562 Pain in left knee: Secondary | ICD-10-CM | POA: Diagnosis not present

## 2018-10-01 DIAGNOSIS — R2689 Other abnormalities of gait and mobility: Secondary | ICD-10-CM | POA: Diagnosis not present

## 2018-10-01 DIAGNOSIS — I110 Hypertensive heart disease with heart failure: Secondary | ICD-10-CM | POA: Diagnosis not present

## 2018-10-03 DIAGNOSIS — R2689 Other abnormalities of gait and mobility: Secondary | ICD-10-CM | POA: Diagnosis not present

## 2018-10-03 DIAGNOSIS — F0390 Unspecified dementia without behavioral disturbance: Secondary | ICD-10-CM | POA: Diagnosis not present

## 2018-10-03 DIAGNOSIS — M25561 Pain in right knee: Secondary | ICD-10-CM | POA: Diagnosis not present

## 2018-10-03 DIAGNOSIS — E119 Type 2 diabetes mellitus without complications: Secondary | ICD-10-CM | POA: Diagnosis not present

## 2018-10-03 DIAGNOSIS — I110 Hypertensive heart disease with heart failure: Secondary | ICD-10-CM | POA: Diagnosis not present

## 2018-10-03 DIAGNOSIS — M25562 Pain in left knee: Secondary | ICD-10-CM | POA: Diagnosis not present

## 2018-10-08 DIAGNOSIS — I110 Hypertensive heart disease with heart failure: Secondary | ICD-10-CM | POA: Diagnosis not present

## 2018-10-08 DIAGNOSIS — E119 Type 2 diabetes mellitus without complications: Secondary | ICD-10-CM | POA: Diagnosis not present

## 2018-10-08 DIAGNOSIS — R2689 Other abnormalities of gait and mobility: Secondary | ICD-10-CM | POA: Diagnosis not present

## 2018-10-08 DIAGNOSIS — F0390 Unspecified dementia without behavioral disturbance: Secondary | ICD-10-CM | POA: Diagnosis not present

## 2018-10-08 DIAGNOSIS — M25562 Pain in left knee: Secondary | ICD-10-CM | POA: Diagnosis not present

## 2018-10-08 DIAGNOSIS — M25561 Pain in right knee: Secondary | ICD-10-CM | POA: Diagnosis not present

## 2018-10-10 DIAGNOSIS — I110 Hypertensive heart disease with heart failure: Secondary | ICD-10-CM | POA: Diagnosis not present

## 2018-10-10 DIAGNOSIS — M25561 Pain in right knee: Secondary | ICD-10-CM | POA: Diagnosis not present

## 2018-10-10 DIAGNOSIS — E119 Type 2 diabetes mellitus without complications: Secondary | ICD-10-CM | POA: Diagnosis not present

## 2018-10-10 DIAGNOSIS — M25562 Pain in left knee: Secondary | ICD-10-CM | POA: Diagnosis not present

## 2018-10-10 DIAGNOSIS — F0390 Unspecified dementia without behavioral disturbance: Secondary | ICD-10-CM | POA: Diagnosis not present

## 2018-10-10 DIAGNOSIS — R2689 Other abnormalities of gait and mobility: Secondary | ICD-10-CM | POA: Diagnosis not present

## 2018-10-16 ENCOUNTER — Encounter: Payer: Self-pay | Admitting: Internal Medicine

## 2018-10-16 ENCOUNTER — Ambulatory Visit (INDEPENDENT_AMBULATORY_CARE_PROVIDER_SITE_OTHER): Payer: Medicare Other | Admitting: Internal Medicine

## 2018-10-16 VITALS — BP 143/57 | HR 62 | Ht 62.0 in

## 2018-10-16 DIAGNOSIS — I5032 Chronic diastolic (congestive) heart failure: Secondary | ICD-10-CM

## 2018-10-16 DIAGNOSIS — Z951 Presence of aortocoronary bypass graft: Secondary | ICD-10-CM

## 2018-10-16 DIAGNOSIS — I10 Essential (primary) hypertension: Secondary | ICD-10-CM

## 2018-10-16 DIAGNOSIS — Z953 Presence of xenogenic heart valve: Secondary | ICD-10-CM | POA: Diagnosis not present

## 2018-10-16 NOTE — Progress Notes (Signed)
OFFICE NOTE  Chief Complaint:  Follow-up TAVR  Primary Care Physician: Celene Squibb, MD  HPI:  Katherine [name] is an 84 year old female with a history of coronary disease status post CABG in 2003, morbid obesity, hypertension, dyslipidemia, and diabetes type 2. She had valvular aortic stenosis with a sign of increased gradient on her last echo in November. We repeated that echo on October 22, 2012 which showed actually no significant change in her aortic valve gradient with a valve area of about 1.2 cm, and peak and mean gradients of 45 and 24 mmHg. This is similar to findings in 2012. I suspect that we may have had an incomplete envelop and that her true gradients are closer to 50 and 30 mmHg. However, this would still yield moderate to severe aortic stenosis. Overall though she is asymptomatic at this point and there is no clear recommendation for surgery or procedures at this time. He reports that recently she had a urinary tract infection which is associated with some confusion. Ultimately this is treated and she rebounded quite quickly. She denies any chest pain or worsening shortness of breath. She has had no syncopal episodes. She is stated in the past but she is not interested in a repeat open heart surgical procedure, nor do I think she is a good candidate for that.  She returns today for followup with a repeat echo. This shows fairly stable and gradients and again moderate to severe aortic stenosis with a valve area of about 1-1.1 cm. She has reported a slight increase in lower extremity swelling but also for the past 2 weeks has awakened in the morning with chest pain. The chest discomfort is located over the left anterior chest wall and is somewhat worse with movement and position. It is not necessarily associated with exertion, however she no longer has to walk up stairs as she moved her washing machine and dryer up to the first floor. Her last stress test was in 2011 and was  nonischemic.  Katherine [name] returns today for follow-up. She was recently seen by Cecilie Kicks, FNP, for lower extremity swelling and shortness of breath. She had her Lasix dose increased to 40 mg daily. A chest x-ray was performed which shows cardiomegaly without overt heart failure. BNP was elevated at 655.  A repeat echocardiogram was performed which shows an EF of 65-70%. There is now moderate to more likely severe aortic stenosis with a mean gradient of 34 mmHg and a peak gradient of 59 mmHg. She reports today that there is been mild improvement in her shortness of breath however her weight remains fairly stable. Her leg edema has improved somewhat.  [name] returns today for follow-up. She reports her swelling is improved significantly. Unfortunate she's developed a chest cold. She was advised to use Mucinex to loosen it up. She has had aggressive shortness of breath and easy fatigability. As mentioned above she is close to severe aortic stenosis. We discussed several options including TAVR today, which I think would be the best alternative for her. I would like to repeat her echocardiogram in 5-6 months and see her back at that time. If her mean gradient has increased some, I will refer her to the valve clinic.   I had the pleasure seeing Katherine [name] back in the office today for follow-up. She was supposed to have an echocardiogram which we were going to review, however that has not yet been performed. She reports stable breathlessness but no chest  pain. She's had no syncope. Unfortunately she's gained more weight.  I saw Katherine [name] back in the office today. She recently underwent TAVR replacement of her aortic valve. She seems to be doing much better from a cardiovascular standpoint. She's currently on Lasix 40 mg twice a day due to some lower extremity edema. She seemed to have a very protracted recovery and was a candidate place for about 2 months. Overall though she is breathing better and  seems to have a little more stamina. As previously mentioned her aortic valve gradient is very low and post procedure echo shows normal function.  08/04/2017  Katherine [name] was seen today in follow-up. She had successful TAVR surgery. Since surgery she is denied any worsening shortness of breath or chest pain. Repeat serial echocardiograms indicate a low transvalvular gradient. She remains on clopidogrel. She unfortunately has not managed to lose much weight. Blood pressure has been well controlled. EKG shows no ischemic changes.  10/16/2018  Katherine [name] returns today for follow-up.  She continues to do well from a cardiac standpoint however her daughter noted that she has had some progressive memory loss.  She is not quite as active as she was and she is not walking regularly now.  She complains of knee pain.  She has been working with physical therapy without much benefit.  She has no chest pain.  There is no worsening shortness of breath.  Blood pressure is well controlled today.  Her EKG shows no ischemic changes.  Lab work from earlier this year showed total cholesterol 135, HDL 46, LDL 73 and triglycerides 80 hemoglobin A1c of 6.4.  PMHx:  Past Medical History:  Diagnosis Date  . Anemia   . Anxiety   . Aortic valvular stenosis    moderate to severe  EF on 2D Echo 10/22/2012 65-70%  valve area of 1.2cm, and peak and mean gradients of 45 and 31mmHg  . Chalazion of right upper eyelid early June 2016   treating with cream, Rx by Dr. Merlyn Albert    . CHF (congestive heart failure) ([place])   . Coronary artery disease   . Dementia ([place])   . Diabetes mellitus, type 2 ([place])   . Dyspnea    chronic  . Easy fatigability   . Gastric mass   . Gastric ulcer   . GERD (gastroesophageal reflux disease)   . Heart murmur   . Hernia of unspecified site of abdominal cavity without mention of obstruction or gangrene    hiatal  . Hyperlipidemia   . Hypertension   . Normal cardiac stress test 12/2013    normal stress nuclear study  . Osteoarthritis   . Thyroid mass     Past Surgical History:  Procedure Laterality Date  . AORTIC VALVE SURGERY    . CATARACT EXTRACTION Bilateral   . CORONARY ARTERY BYPASS GRAFT     x4 in 2003  . internal hemorrhoidectomy    . KNEE SURGERY Right   . LEFT AND RIGHT HEART CATHETERIZATION WITH CORONARY/GRAFT ANGIOGRAM N/A 03/24/2015   Procedure: LEFT AND RIGHT HEART CATHETERIZATION WITH Beatrix Fetters;  Surgeon: Burnell Blanks, MD;  Location: [facility] CATH LAB;  Service: Cardiovascular;  Laterality: N/A;  . TEE WITHOUT CARDIOVERSION N/A 05/26/2015   Procedure: TRANSESOPHAGEAL ECHOCARDIOGRAM (TEE);  Surgeon: Burnell Blanks, MD;  Location: [facility];  Service: Open Heart Surgery;  Laterality: N/A;  . TRANSCATHETER AORTIC VALVE REPLACEMENT, TRANSFEMORAL N/A 05/26/2015   Procedure: TRANSCATHETER AORTIC VALVE REPLACEMENT, TRANSFEMORAL;  Surgeon: Harrell Gave  Santina Evans, MD;  Location: [facility];  Service: Open Heart Surgery;  Laterality: N/A;  . TUBAL LIGATION    . uterine polypectomy      FAMHx:  Family History  Problem Relation Age of Onset  . Diabetes Mother   . Heart disease Mother   . Stroke Mother   . Cerebral aneurysm Mother   . Stroke Father   . Heart attack Brother   . Colon cancer Neg Hx     SOCHx:   reports that she has never smoked. She has never used smokeless tobacco. She reports that she does not drink alcohol or use drugs.  ALLERGIES:  Allergies  Allergen Reactions  . Tape Other (See Comments)    SKIN IS VERY THIN AND WILL TEAR EASILY!!    ROS: Pertinent items noted in HPI and remainder of comprehensive ROS otherwise negative.  HOME MEDS: Current Outpatient Medications  Medication Sig Dispense Refill  . acetaminophen (TYLENOL) 500 MG tablet Take two tablets by mouth twice daily for pain. Do not exceed 4gm of Tylenol in 24 hours    . ALPRAZolam (XANAX) 0.25 MG tablet Take 0.5 tablets (0.125 mg total) by mouth 2 (two)  times daily as needed for anxiety or sleep. (Patient taking differently: Take 0.25 mg by mouth 3 (three) times daily. ) 10 tablet 0  . atorvastatin (LIPITOR) 80 MG tablet Take 80 mg by mouth daily.     . clopidogrel (PLAVIX) 75 MG tablet Take 1 tablet (75 mg total) by mouth daily with breakfast. 30 tablet 11  . esomeprazole (NEXIUM) 20 MG capsule Take 20 mg by mouth daily at 12 noon.    . fexofenadine (ALLEGRA) 180 MG tablet Take 180 mg by mouth daily.    . furosemide (LASIX) 20 MG tablet TAKE 2 TABLETS BY MOUTH DAILY 180 tablet 0  . hydrALAZINE (APRESOLINE) 50 MG tablet Take 1 tablet (50 mg total) by mouth every 8 (eight) hours. (Patient taking differently: Take 50 mg by mouth every 8 (eight) hours. For blood pressure) 90 tablet 11  . insulin detemir (LEVEMIR) 100 UNIT/ML injection Inject 0.1 mLs (10 Units total) into the skin at bedtime. 10 mL 11  . lactulose (CHRONULAC) 10 GM/15ML solution Take 45 mLs (30 g total) by mouth daily as needed for mild constipation or severe constipation. 240 mL 0  . Linaclotide (LINZESS) 145 MCG CAPS capsule Take 145 mcg by mouth daily as needed (for constipation).    Marland Kitchen lisinopril (PRINIVIL,ZESTRIL) 10 MG tablet Take 1 tablet (10 mg total) by mouth daily. 30 tablet 11  . loperamide (IMODIUM) 2 MG capsule Take 2 mg by mouth as needed for diarrhea or loose stools.    . meloxicam (MOBIC) 7.5 MG tablet Take 7.5 mg by mouth daily as needed for pain.     . Memantine HCl ER (NAMENDA XR) 28 MG CP24 Take one capsule by mouth every morning for dementia    . metFORMIN (GLUCOPHAGE-XR) 500 MG 24 hr tablet Take 500 mg by mouth daily with breakfast. To control blood sugar. Do not crush.    . nebivolol (BYSTOLIC) 10 MG tablet Take one tablet by mouth every morning for blood pressure    . NYSTATIN powder Apply 1 application topically 2 (two) times daily as needed for for breast care.    . potassium chloride SA (K-DUR,KLOR-CON) 20 MEQ tablet Take 20 mEq by mouth daily.    . VOLTAREN  1 % GEL Apply 2 g topically daily as needed (pain).  No current facility-administered medications for this visit.     LABS/IMAGING: No results found for this or any previous visit (from the past 48 hour(s)). No results found.  VITALS: BP (!) 143/57   Pulse 62   Ht 5\' 2"  (1.575 m)   BMI 37.86 kg/m   EXAM: General appearance: alert, no distress, moderately obese and in wheelchair Neck: no carotid bruit, no JVD and thyroid not enlarged, symmetric, no tenderness/mass/nodules Lungs: clear to auscultation bilaterally Heart: regular rate and rhythm, S1, S2 normal and systolic murmur: early systolic 2/6, crescendo at 2nd right intercostal space Abdomen: soft, non-tender; bowel sounds normal; no masses,  no organomegaly Extremities: extremities normal, atraumatic, no cyanosis or edema Pulses: 2+ and symmetric Skin: Skin color, texture, turgor normal. No rashes or lesions Neurologic: Grossly normal Psych: Pleasant  EKG: Normal sinus rhythm at 62, voltage criteria for LVH-personally reviewed  ASSESSMENT: 1. Chest pain - resolved, low risk NST 01/2014 2. Severe aortic stenosis - s/p TAVR (06/2015) 3. Morbid obesity 4. History of coronary artery disease status post CABG in 2003 5. Hypertension 6. Dyslipidemia 7. Diabetes type 2  PLAN: 1.   Ms. Kreiger continues to do well after TAVR now 3 years ago.  Her last echo was in 2017 which showed stable gradients.  Her aortic murmur is not necessarily worsened.  She denies any chest pain or shortness of breath.  She is due for repeat imaging per current guidelines.  I recommend a repeat echocardiogram which could be obtained in [place].  Unfortunately she has had some progressive memory loss and less activity.  Plan follow-up with me annually or sooner as necessary.  [name], MD, [facility], [place] Director of the Advanced Lipid Disorders &  Cardiovascular Risk Reduction Clinic Diplomate of the  American Board of Clinical Lipidology Attending Cardiologist  Direct Dial: [phone]  Fax: [phone]  Website:  [email or web address] 10/16/2018, 3:59 PM

## 2018-10-16 NOTE — Patient Instructions (Signed)
Medication Instructions:  Continue current medications If you need a refill on your cardiac medications before your next appointment, please call your pharmacy.   Testing/Procedures: Your physician has requested that you have an echocardiogram. Echocardiography is a painless test that uses sound waves to create images of your heart. It provides your doctor with information about the size and shape of your heart and how well your heart's chambers and valves are working. This procedure takes approximately one hour. There are no restrictions for this procedure. -- schedule at [place]: At [facility], you and your health needs are our priority.  As part of our continuing mission to provide you with exceptional heart care, we have created designated Provider Care Teams.  These Care Teams include your primary Cardiologist (physician) and Advanced Practice Providers (APPs -  Physician Assistants and Nurse Practitioners) who all work together to provide you with the care you need, when you need it. You will need a follow up appointment in 12 months.  Please call our office 2 months in advance to schedule this appointment.  You may see Dr. Debara Pickett or one of the following Advanced Practice Providers on your designated Care Team: Almyra Deforest, [place] . Fabian Sharp, PA-C  Any Other Special Instructions Will Be Listed Below (If Applicable).

## 2018-10-17 DIAGNOSIS — F0390 Unspecified dementia without behavioral disturbance: Secondary | ICD-10-CM | POA: Diagnosis not present

## 2018-10-17 DIAGNOSIS — R2689 Other abnormalities of gait and mobility: Secondary | ICD-10-CM | POA: Diagnosis not present

## 2018-10-17 DIAGNOSIS — E119 Type 2 diabetes mellitus without complications: Secondary | ICD-10-CM | POA: Diagnosis not present

## 2018-10-17 DIAGNOSIS — M25561 Pain in right knee: Secondary | ICD-10-CM | POA: Diagnosis not present

## 2018-10-17 DIAGNOSIS — I110 Hypertensive heart disease with heart failure: Secondary | ICD-10-CM | POA: Diagnosis not present

## 2018-10-17 DIAGNOSIS — M25562 Pain in left knee: Secondary | ICD-10-CM | POA: Diagnosis not present

## 2018-10-18 ENCOUNTER — Ambulatory Visit (HOSPITAL_COMMUNITY)
Admission: RE | Admit: 2018-10-18 | Discharge: 2018-10-18 | Disposition: A | Discharge status: Home / Self Care | Payer: Medicare Other | Source: Ambulatory Visit | Attending: Internal Medicine | Admitting: Internal Medicine

## 2018-10-18 DIAGNOSIS — Z953 Presence of xenogenic heart valve: Secondary | ICD-10-CM

## 2018-10-18 NOTE — Progress Notes (Signed)
*  PRELIMINARY RESULTS* Echocardiogram 2D Echocardiogram has been performed.  Samuel Germany 10/18/2018, 1:04 PM

## 2018-10-19 DIAGNOSIS — M25562 Pain in left knee: Secondary | ICD-10-CM | POA: Diagnosis not present

## 2018-10-19 DIAGNOSIS — R2689 Other abnormalities of gait and mobility: Secondary | ICD-10-CM | POA: Diagnosis not present

## 2018-10-19 DIAGNOSIS — F0390 Unspecified dementia without behavioral disturbance: Secondary | ICD-10-CM | POA: Diagnosis not present

## 2018-10-19 DIAGNOSIS — M25561 Pain in right knee: Secondary | ICD-10-CM | POA: Diagnosis not present

## 2018-10-19 DIAGNOSIS — E119 Type 2 diabetes mellitus without complications: Secondary | ICD-10-CM | POA: Diagnosis not present

## 2018-10-19 DIAGNOSIS — I110 Hypertensive heart disease with heart failure: Secondary | ICD-10-CM | POA: Diagnosis not present

## 2018-10-20 ENCOUNTER — Other Ambulatory Visit: Payer: Self-pay | Admitting: Internal Medicine

## 2018-11-14 ENCOUNTER — Ambulatory Visit (INDEPENDENT_AMBULATORY_CARE_PROVIDER_SITE_OTHER): Payer: Medicare Other | Admitting: Podiatry

## 2018-11-14 ENCOUNTER — Encounter: Payer: Self-pay | Admitting: Podiatry

## 2018-11-14 DIAGNOSIS — B351 Tinea unguium: Secondary | ICD-10-CM

## 2018-11-14 DIAGNOSIS — Z23 Encounter for immunization: Secondary | ICD-10-CM | POA: Diagnosis not present

## 2018-11-14 DIAGNOSIS — M79674 Pain in right toe(s): Secondary | ICD-10-CM | POA: Diagnosis not present

## 2018-11-14 DIAGNOSIS — M79675 Pain in left toe(s): Secondary | ICD-10-CM | POA: Diagnosis not present

## 2018-11-14 DIAGNOSIS — E1140 Type 2 diabetes mellitus with diabetic neuropathy, unspecified: Secondary | ICD-10-CM | POA: Diagnosis not present

## 2018-11-14 DIAGNOSIS — D689 Coagulation defect, unspecified: Secondary | ICD-10-CM | POA: Diagnosis not present

## 2018-11-14 NOTE — Progress Notes (Signed)
Patient ID: Katherine [name], female   DOB: [birth year], 84 y.o.   MRN: [id number] Complaint:  Visit Type: Patient returns to my office for continued preventative foot care services. Complaint: Patient states" my nails have grown long and thick and become painful to walk and wear shoes" Patient has been diagnosed with DM with no foot complications. The patient presents for preventative foot care services. No changes to ROS.  Patient is taking plavix.  Podiatric Exam: Vascular: dorsalis pedis and posterior tibial pulses are not  palpable bilateral due to foot swelling.. Capillary return is immediate. Temperature gradient is WNL. Skin turgor WNL  Sensorium: Diminished Semmes Weinstein monofilament test. Normal tactile sensation bilaterally. Nail Exam: Pt has thick disfigured discolored nails with subungual debris noted bilateral entire nail hallux through fifth toenails Ulcer Exam: There is no evidence of ulcer or pre-ulcerative changes or infection. Orthopedic Exam: Muscle tone and strength are WNL. No limitations in general ROM. No crepitus or effusions noted. Foot type and digits show no abnormalities. Bony prominences are unremarkable. HAV deformity B/L Skin: No Porokeratosis. No infection or ulcers  Diagnosis:  Onychomycosis, , Pain in right toe, pain in left toes Diabetes with neuropathy. HAV  B/L,  Angiopathy  Treatment & Plan Procedures and Treatment: Consent by patient was obtained for treatment procedures. The patient understood the discussion of treatment and procedures well. All questions were answered thoroughly reviewed. Debridement of mycotic and hypertrophic toenails, 1 through 5 bilateral and clearing of subungual debris. No ulceration, no infection noted.  ABN signed for 2019. Return Visit-Office Procedure: Patient instructed to return to the office for a follow up visit 3 months for continued evaluation and treatment.   Gardiner Barefoot DPM

## 2018-11-20 ENCOUNTER — Telehealth: Payer: Self-pay | Admitting: Internal Medicine

## 2018-11-20 NOTE — Telephone Encounter (Signed)
Spoke with daughter about test results

## 2018-11-20 NOTE — Telephone Encounter (Signed)
New Message:    Patient daughter returning call concerning patient lab result . Please call daughter back.

## 2018-11-20 NOTE — Telephone Encounter (Signed)
LMTCB to review echo results   Notes recorded by [name], MD on 10/18/2018 at 7:54 PM EST Stable echo - low TAVR gradient.  Dr. Lemmie Evens

## 2018-11-30 DIAGNOSIS — E119 Type 2 diabetes mellitus without complications: Secondary | ICD-10-CM | POA: Diagnosis not present

## 2018-11-30 DIAGNOSIS — E1165 Type 2 diabetes mellitus with hyperglycemia: Secondary | ICD-10-CM | POA: Diagnosis not present

## 2018-11-30 DIAGNOSIS — I10 Essential (primary) hypertension: Secondary | ICD-10-CM | POA: Diagnosis not present

## 2018-11-30 DIAGNOSIS — E782 Mixed hyperlipidemia: Secondary | ICD-10-CM | POA: Diagnosis not present

## 2018-12-01 DIAGNOSIS — Z0000 Encounter for general adult medical examination without abnormal findings: Secondary | ICD-10-CM | POA: Diagnosis not present

## 2018-12-06 DIAGNOSIS — F0390 Unspecified dementia without behavioral disturbance: Secondary | ICD-10-CM | POA: Diagnosis not present

## 2018-12-06 DIAGNOSIS — G4700 Insomnia, unspecified: Secondary | ICD-10-CM | POA: Diagnosis not present

## 2018-12-06 DIAGNOSIS — N2889 Other specified disorders of kidney and ureter: Secondary | ICD-10-CM | POA: Diagnosis not present

## 2018-12-06 DIAGNOSIS — M25569 Pain in unspecified knee: Secondary | ICD-10-CM | POA: Diagnosis not present

## 2018-12-06 DIAGNOSIS — N39498 Other specified urinary incontinence: Secondary | ICD-10-CM | POA: Diagnosis not present

## 2018-12-06 DIAGNOSIS — R600 Localized edema: Secondary | ICD-10-CM | POA: Diagnosis not present

## 2018-12-06 DIAGNOSIS — I10 Essential (primary) hypertension: Secondary | ICD-10-CM | POA: Diagnosis not present

## 2018-12-06 DIAGNOSIS — E119 Type 2 diabetes mellitus without complications: Secondary | ICD-10-CM | POA: Diagnosis not present

## 2018-12-06 DIAGNOSIS — I5020 Unspecified systolic (congestive) heart failure: Secondary | ICD-10-CM | POA: Diagnosis not present

## 2018-12-06 DIAGNOSIS — E870 Hyperosmolality and hypernatremia: Secondary | ICD-10-CM | POA: Diagnosis not present

## 2018-12-06 DIAGNOSIS — D649 Anemia, unspecified: Secondary | ICD-10-CM | POA: Diagnosis not present

## 2018-12-18 DIAGNOSIS — R300 Dysuria: Secondary | ICD-10-CM | POA: Diagnosis not present

## 2019-01-11 DIAGNOSIS — B379 Candidiasis, unspecified: Secondary | ICD-10-CM | POA: Diagnosis not present

## 2019-01-30 DIAGNOSIS — N390 Urinary tract infection, site not specified: Secondary | ICD-10-CM | POA: Diagnosis not present

## 2019-02-08 DIAGNOSIS — F411 Generalized anxiety disorder: Secondary | ICD-10-CM | POA: Diagnosis not present

## 2019-02-08 DIAGNOSIS — R300 Dysuria: Secondary | ICD-10-CM | POA: Diagnosis not present

## 2019-02-08 DIAGNOSIS — F5101 Primary insomnia: Secondary | ICD-10-CM | POA: Diagnosis not present

## 2019-02-08 DIAGNOSIS — E782 Mixed hyperlipidemia: Secondary | ICD-10-CM | POA: Diagnosis not present

## 2019-02-08 DIAGNOSIS — E1165 Type 2 diabetes mellitus with hyperglycemia: Secondary | ICD-10-CM | POA: Diagnosis not present

## 2019-02-08 DIAGNOSIS — R3981 Functional urinary incontinence: Secondary | ICD-10-CM | POA: Diagnosis not present

## 2019-02-08 DIAGNOSIS — I10 Essential (primary) hypertension: Secondary | ICD-10-CM | POA: Diagnosis not present

## 2019-02-08 DIAGNOSIS — R531 Weakness: Secondary | ICD-10-CM | POA: Diagnosis not present

## 2019-02-08 DIAGNOSIS — N393 Stress incontinence (female) (male): Secondary | ICD-10-CM | POA: Diagnosis not present

## 2019-02-08 DIAGNOSIS — G4700 Insomnia, unspecified: Secondary | ICD-10-CM | POA: Diagnosis not present

## 2019-02-08 DIAGNOSIS — N2889 Other specified disorders of kidney and ureter: Secondary | ICD-10-CM | POA: Diagnosis not present

## 2019-02-08 DIAGNOSIS — D509 Iron deficiency anemia, unspecified: Secondary | ICD-10-CM | POA: Diagnosis not present

## 2019-02-08 DIAGNOSIS — E870 Hyperosmolality and hypernatremia: Secondary | ICD-10-CM | POA: Diagnosis not present

## 2019-02-08 DIAGNOSIS — B372 Candidiasis of skin and nail: Secondary | ICD-10-CM | POA: Diagnosis not present

## 2019-02-08 DIAGNOSIS — N390 Urinary tract infection, site not specified: Secondary | ICD-10-CM | POA: Diagnosis not present

## 2019-02-08 DIAGNOSIS — F0390 Unspecified dementia without behavioral disturbance: Secondary | ICD-10-CM | POA: Diagnosis not present

## 2019-02-08 DIAGNOSIS — E119 Type 2 diabetes mellitus without complications: Secondary | ICD-10-CM | POA: Diagnosis not present

## 2019-02-13 ENCOUNTER — Ambulatory Visit: Payer: Medicare Other | Admitting: Podiatry

## 2019-02-18 DIAGNOSIS — N390 Urinary tract infection, site not specified: Secondary | ICD-10-CM | POA: Diagnosis not present

## 2019-02-18 DIAGNOSIS — F0151 Vascular dementia with behavioral disturbance: Secondary | ICD-10-CM | POA: Diagnosis not present

## 2019-02-18 DIAGNOSIS — W19XXXA Unspecified fall, initial encounter: Secondary | ICD-10-CM | POA: Diagnosis not present

## 2019-02-18 DIAGNOSIS — I5042 Chronic combined systolic (congestive) and diastolic (congestive) heart failure: Secondary | ICD-10-CM | POA: Diagnosis not present

## 2019-02-18 DIAGNOSIS — I350 Nonrheumatic aortic (valve) stenosis: Secondary | ICD-10-CM | POA: Diagnosis not present

## 2019-02-18 DIAGNOSIS — I10 Essential (primary) hypertension: Secondary | ICD-10-CM | POA: Diagnosis not present

## 2019-02-19 ENCOUNTER — Ambulatory Visit: Payer: Medicare Other | Admitting: Podiatry

## 2019-02-27 DIAGNOSIS — B372 Candidiasis of skin and nail: Secondary | ICD-10-CM | POA: Diagnosis not present

## 2019-02-27 DIAGNOSIS — E119 Type 2 diabetes mellitus without complications: Secondary | ICD-10-CM | POA: Diagnosis not present

## 2019-02-27 DIAGNOSIS — D509 Iron deficiency anemia, unspecified: Secondary | ICD-10-CM | POA: Diagnosis not present

## 2019-02-27 DIAGNOSIS — F0390 Unspecified dementia without behavioral disturbance: Secondary | ICD-10-CM | POA: Diagnosis not present

## 2019-02-27 DIAGNOSIS — R739 Hyperglycemia, unspecified: Secondary | ICD-10-CM | POA: Diagnosis not present

## 2019-02-27 DIAGNOSIS — E1165 Type 2 diabetes mellitus with hyperglycemia: Secondary | ICD-10-CM | POA: Diagnosis not present

## 2019-02-27 DIAGNOSIS — G4700 Insomnia, unspecified: Secondary | ICD-10-CM | POA: Diagnosis not present

## 2019-02-27 DIAGNOSIS — E782 Mixed hyperlipidemia: Secondary | ICD-10-CM | POA: Diagnosis not present

## 2019-02-27 DIAGNOSIS — F411 Generalized anxiety disorder: Secondary | ICD-10-CM | POA: Diagnosis not present

## 2019-02-27 DIAGNOSIS — F0151 Vascular dementia with behavioral disturbance: Secondary | ICD-10-CM | POA: Diagnosis not present

## 2019-02-27 DIAGNOSIS — F5101 Primary insomnia: Secondary | ICD-10-CM | POA: Diagnosis not present

## 2019-02-27 DIAGNOSIS — E870 Hyperosmolality and hypernatremia: Secondary | ICD-10-CM | POA: Diagnosis not present

## 2019-03-08 ENCOUNTER — Ambulatory Visit: Payer: Medicare Other | Admitting: Podiatry

## 2019-03-12 DIAGNOSIS — I10 Essential (primary) hypertension: Secondary | ICD-10-CM | POA: Diagnosis not present

## 2019-03-12 DIAGNOSIS — E1165 Type 2 diabetes mellitus with hyperglycemia: Secondary | ICD-10-CM | POA: Diagnosis not present

## 2019-03-12 DIAGNOSIS — D509 Iron deficiency anemia, unspecified: Secondary | ICD-10-CM | POA: Diagnosis not present

## 2019-03-12 DIAGNOSIS — E782 Mixed hyperlipidemia: Secondary | ICD-10-CM | POA: Diagnosis not present

## 2019-03-12 DIAGNOSIS — I5042 Chronic combined systolic (congestive) and diastolic (congestive) heart failure: Secondary | ICD-10-CM | POA: Diagnosis not present

## 2019-03-19 DIAGNOSIS — D508 Other iron deficiency anemias: Secondary | ICD-10-CM | POA: Diagnosis not present

## 2019-03-19 DIAGNOSIS — E1169 Type 2 diabetes mellitus with other specified complication: Secondary | ICD-10-CM | POA: Diagnosis not present

## 2019-03-19 DIAGNOSIS — I5042 Chronic combined systolic (congestive) and diastolic (congestive) heart failure: Secondary | ICD-10-CM | POA: Diagnosis not present

## 2019-03-19 DIAGNOSIS — E870 Hyperosmolality and hypernatremia: Secondary | ICD-10-CM | POA: Diagnosis not present

## 2019-03-19 DIAGNOSIS — F0151 Vascular dementia with behavioral disturbance: Secondary | ICD-10-CM | POA: Diagnosis not present

## 2019-03-19 DIAGNOSIS — F5105 Insomnia due to other mental disorder: Secondary | ICD-10-CM | POA: Diagnosis not present

## 2019-03-19 DIAGNOSIS — M25569 Pain in unspecified knee: Secondary | ICD-10-CM | POA: Diagnosis not present

## 2019-03-19 DIAGNOSIS — R197 Diarrhea, unspecified: Secondary | ICD-10-CM | POA: Diagnosis not present

## 2019-03-19 DIAGNOSIS — I350 Nonrheumatic aortic (valve) stenosis: Secondary | ICD-10-CM | POA: Diagnosis not present

## 2019-03-19 DIAGNOSIS — R600 Localized edema: Secondary | ICD-10-CM | POA: Diagnosis not present

## 2019-03-19 DIAGNOSIS — E782 Mixed hyperlipidemia: Secondary | ICD-10-CM | POA: Diagnosis not present

## 2019-03-20 ENCOUNTER — Ambulatory Visit: Payer: Medicare Other | Admitting: Podiatry

## 2019-04-01 DIAGNOSIS — R600 Localized edema: Secondary | ICD-10-CM | POA: Diagnosis not present

## 2019-04-01 DIAGNOSIS — I700 Atherosclerosis of aorta: Secondary | ICD-10-CM | POA: Diagnosis not present

## 2019-04-01 DIAGNOSIS — I10 Essential (primary) hypertension: Secondary | ICD-10-CM | POA: Diagnosis not present

## 2019-04-01 DIAGNOSIS — I5042 Chronic combined systolic (congestive) and diastolic (congestive) heart failure: Secondary | ICD-10-CM | POA: Diagnosis not present

## 2019-04-01 DIAGNOSIS — I350 Nonrheumatic aortic (valve) stenosis: Secondary | ICD-10-CM | POA: Diagnosis not present

## 2019-04-01 DIAGNOSIS — E119 Type 2 diabetes mellitus without complications: Secondary | ICD-10-CM | POA: Diagnosis not present

## 2019-04-01 DIAGNOSIS — F0151 Vascular dementia with behavioral disturbance: Secondary | ICD-10-CM | POA: Diagnosis not present

## 2019-04-01 DIAGNOSIS — E1165 Type 2 diabetes mellitus with hyperglycemia: Secondary | ICD-10-CM | POA: Diagnosis not present

## 2019-04-15 DIAGNOSIS — E1165 Type 2 diabetes mellitus with hyperglycemia: Secondary | ICD-10-CM | POA: Diagnosis not present

## 2019-04-15 DIAGNOSIS — I5042 Chronic combined systolic (congestive) and diastolic (congestive) heart failure: Secondary | ICD-10-CM | POA: Diagnosis not present

## 2019-04-15 DIAGNOSIS — I10 Essential (primary) hypertension: Secondary | ICD-10-CM | POA: Diagnosis not present

## 2019-04-15 DIAGNOSIS — I700 Atherosclerosis of aorta: Secondary | ICD-10-CM | POA: Diagnosis not present

## 2019-05-07 ENCOUNTER — Ambulatory Visit: Payer: Medicare Other | Admitting: Podiatry

## 2019-05-29 DIAGNOSIS — I10 Essential (primary) hypertension: Secondary | ICD-10-CM | POA: Diagnosis not present

## 2019-05-29 DIAGNOSIS — F0151 Vascular dementia with behavioral disturbance: Secondary | ICD-10-CM | POA: Diagnosis not present

## 2019-05-29 DIAGNOSIS — I700 Atherosclerosis of aorta: Secondary | ICD-10-CM | POA: Diagnosis not present

## 2019-05-29 DIAGNOSIS — E119 Type 2 diabetes mellitus without complications: Secondary | ICD-10-CM | POA: Diagnosis not present

## 2019-05-29 DIAGNOSIS — I350 Nonrheumatic aortic (valve) stenosis: Secondary | ICD-10-CM | POA: Diagnosis not present

## 2019-05-29 DIAGNOSIS — I5042 Chronic combined systolic (congestive) and diastolic (congestive) heart failure: Secondary | ICD-10-CM | POA: Diagnosis not present

## 2019-05-29 DIAGNOSIS — R600 Localized edema: Secondary | ICD-10-CM | POA: Diagnosis not present

## 2019-05-29 DIAGNOSIS — E1165 Type 2 diabetes mellitus with hyperglycemia: Secondary | ICD-10-CM | POA: Diagnosis not present

## 2019-06-04 ENCOUNTER — Ambulatory Visit (INDEPENDENT_AMBULATORY_CARE_PROVIDER_SITE_OTHER): Payer: Medicare Other | Admitting: Podiatry

## 2019-06-04 ENCOUNTER — Encounter: Payer: Self-pay | Admitting: Podiatry

## 2019-06-04 ENCOUNTER — Other Ambulatory Visit: Payer: Self-pay

## 2019-06-04 DIAGNOSIS — M79674 Pain in right toe(s): Secondary | ICD-10-CM | POA: Diagnosis not present

## 2019-06-04 DIAGNOSIS — E119 Type 2 diabetes mellitus without complications: Secondary | ICD-10-CM | POA: Insufficient documentation

## 2019-06-04 DIAGNOSIS — B351 Tinea unguium: Secondary | ICD-10-CM

## 2019-06-04 DIAGNOSIS — M79675 Pain in left toe(s): Secondary | ICD-10-CM

## 2019-06-04 DIAGNOSIS — D689 Coagulation defect, unspecified: Secondary | ICD-10-CM

## 2019-06-04 HISTORY — DX: Tinea unguium: B35.1

## 2019-06-04 HISTORY — DX: Pain in right toe(s): M79.674

## 2019-06-04 HISTORY — DX: Type 2 diabetes mellitus without complications: E11.9

## 2019-06-04 NOTE — Progress Notes (Signed)
Patient ID: Katherine [name], female   DOB: 1928-06-05, 85 y.o.   MRN: [id number] Complaint:  Visit Type: Patient returns to my office for continued preventative foot care services. Complaint: Patient states" my nails have grown long and thick and become painful to walk and wear shoes" Patient has been diagnosed with DM with no foot complications. The patient presents for preventative foot care services. No changes to ROS.  Patient is taking plavix. Patient is brought into the office by a caregiver.  Caregiver says her daughter has been trimming her nails/.  Podiatric Exam: Vascular: dorsalis pedis and posterior tibial pulses are not  palpable bilateral due to foot swelling.. Capillary return is immediate. Temperature gradient is WNL. Skin turgor WNL  Sensorium: Diminished Semmes Weinstein monofilament test. Normal tactile sensation bilaterally. Nail Exam: Pt has thick disfigured discolored nails with subungual debris noted bilateral entire nail hallux through fifth toenails Ulcer Exam: There is no evidence of ulcer or pre-ulcerative changes or infection. Orthopedic Exam: Muscle tone and strength are WNL. No limitations in general ROM. No crepitus or effusions noted. Foot type and digits show no abnormalities. Bony prominences are unremarkable. HAV deformity B/L Skin: No Porokeratosis. No infection or ulcers  Diagnosis:  Onychomycosis, , Pain in right toe, pain in left toes Diabetes with neuropathy. HAV  B/L,  Angiopathy  Treatment & Plan Procedures and Treatment: Consent by patient was obtained for treatment procedures. The patient understood the discussion of treatment and procedures well. All questions were answered thoroughly reviewed. Debridement of mycotic and hypertrophic toenails, 1 through 5 bilateral and clearing of subungual debris. No ulceration, no infection noted.   Return Visit-Office Procedure: Patient instructed to return to the office for a follow up visit 3 months for continued  evaluation and treatment.   Gardiner Barefoot DPM

## 2019-06-25 DIAGNOSIS — E782 Mixed hyperlipidemia: Secondary | ICD-10-CM | POA: Diagnosis not present

## 2019-06-25 DIAGNOSIS — I10 Essential (primary) hypertension: Secondary | ICD-10-CM | POA: Diagnosis not present

## 2019-06-25 DIAGNOSIS — D509 Iron deficiency anemia, unspecified: Secondary | ICD-10-CM | POA: Diagnosis not present

## 2019-06-25 DIAGNOSIS — E1165 Type 2 diabetes mellitus with hyperglycemia: Secondary | ICD-10-CM | POA: Diagnosis not present

## 2019-06-25 DIAGNOSIS — E1169 Type 2 diabetes mellitus with other specified complication: Secondary | ICD-10-CM | POA: Diagnosis not present

## 2019-07-02 DIAGNOSIS — F5105 Insomnia due to other mental disorder: Secondary | ICD-10-CM | POA: Diagnosis not present

## 2019-07-02 DIAGNOSIS — D508 Other iron deficiency anemias: Secondary | ICD-10-CM | POA: Diagnosis not present

## 2019-07-02 DIAGNOSIS — I350 Nonrheumatic aortic (valve) stenosis: Secondary | ICD-10-CM | POA: Diagnosis not present

## 2019-07-02 DIAGNOSIS — E870 Hyperosmolality and hypernatremia: Secondary | ICD-10-CM | POA: Diagnosis not present

## 2019-07-02 DIAGNOSIS — R600 Localized edema: Secondary | ICD-10-CM | POA: Diagnosis not present

## 2019-07-02 DIAGNOSIS — R944 Abnormal results of kidney function studies: Secondary | ICD-10-CM | POA: Diagnosis not present

## 2019-07-02 DIAGNOSIS — M25569 Pain in unspecified knee: Secondary | ICD-10-CM | POA: Diagnosis not present

## 2019-07-02 DIAGNOSIS — E1169 Type 2 diabetes mellitus with other specified complication: Secondary | ICD-10-CM | POA: Diagnosis not present

## 2019-07-02 DIAGNOSIS — I5042 Chronic combined systolic (congestive) and diastolic (congestive) heart failure: Secondary | ICD-10-CM | POA: Diagnosis not present

## 2019-07-02 DIAGNOSIS — F0151 Vascular dementia with behavioral disturbance: Secondary | ICD-10-CM | POA: Diagnosis not present

## 2019-07-02 DIAGNOSIS — E782 Mixed hyperlipidemia: Secondary | ICD-10-CM | POA: Diagnosis not present

## 2019-07-17 DIAGNOSIS — F5105 Insomnia due to other mental disorder: Secondary | ICD-10-CM | POA: Diagnosis not present

## 2019-07-17 DIAGNOSIS — I350 Nonrheumatic aortic (valve) stenosis: Secondary | ICD-10-CM | POA: Diagnosis not present

## 2019-07-17 DIAGNOSIS — M25569 Pain in unspecified knee: Secondary | ICD-10-CM | POA: Diagnosis not present

## 2019-07-17 DIAGNOSIS — I5042 Chronic combined systolic (congestive) and diastolic (congestive) heart failure: Secondary | ICD-10-CM | POA: Diagnosis not present

## 2019-07-17 DIAGNOSIS — R944 Abnormal results of kidney function studies: Secondary | ICD-10-CM | POA: Diagnosis not present

## 2019-07-17 DIAGNOSIS — R600 Localized edema: Secondary | ICD-10-CM | POA: Diagnosis not present

## 2019-07-17 DIAGNOSIS — E870 Hyperosmolality and hypernatremia: Secondary | ICD-10-CM | POA: Diagnosis not present

## 2019-07-17 DIAGNOSIS — D508 Other iron deficiency anemias: Secondary | ICD-10-CM | POA: Diagnosis not present

## 2019-07-17 DIAGNOSIS — F0151 Vascular dementia with behavioral disturbance: Secondary | ICD-10-CM | POA: Diagnosis not present

## 2019-07-17 DIAGNOSIS — E782 Mixed hyperlipidemia: Secondary | ICD-10-CM | POA: Diagnosis not present

## 2019-07-17 DIAGNOSIS — E1169 Type 2 diabetes mellitus with other specified complication: Secondary | ICD-10-CM | POA: Diagnosis not present

## 2019-08-22 DIAGNOSIS — N390 Urinary tract infection, site not specified: Secondary | ICD-10-CM | POA: Diagnosis not present

## 2019-09-04 ENCOUNTER — Ambulatory Visit (INDEPENDENT_AMBULATORY_CARE_PROVIDER_SITE_OTHER): Payer: Medicare Other | Admitting: Podiatry

## 2019-09-04 ENCOUNTER — Encounter: Payer: Self-pay | Admitting: Podiatry

## 2019-09-04 ENCOUNTER — Other Ambulatory Visit: Payer: Self-pay

## 2019-09-04 DIAGNOSIS — M79675 Pain in left toe(s): Secondary | ICD-10-CM | POA: Diagnosis not present

## 2019-09-04 DIAGNOSIS — D689 Coagulation defect, unspecified: Secondary | ICD-10-CM

## 2019-09-04 DIAGNOSIS — E119 Type 2 diabetes mellitus without complications: Secondary | ICD-10-CM

## 2019-09-04 DIAGNOSIS — M2010 Hallux valgus (acquired), unspecified foot: Secondary | ICD-10-CM

## 2019-09-04 DIAGNOSIS — B351 Tinea unguium: Secondary | ICD-10-CM

## 2019-09-04 DIAGNOSIS — M79674 Pain in right toe(s): Secondary | ICD-10-CM | POA: Diagnosis not present

## 2019-09-04 NOTE — Progress Notes (Signed)
Patient ID: Katherine [name], female   DOB: 01-20-1928, 85 y.o.   MRN: [id number] Complaint:  Visit Type: Patient returns to my office for continued preventative foot care services. Complaint: Patient states" my nails have grown long and thick and become painful to walk and wear shoes" Patient has been diagnosed with DM with no foot complications. The patient presents for preventative foot care services. No changes to ROS.  Patient is taking plavix. Patient is brought into the office by a caregiver.  She presents to the office with her daughter.  Podiatric Exam: Vascular: dorsalis pedis and posterior tibial pulses are not  palpable bilateral due to foot swelling.. Capillary return is immediate. Temperature gradient is WNL. Skin turgor WNL  Sensorium: Diminished Semmes Weinstein monofilament test. Normal tactile sensation bilaterally. Nail Exam: Pt has thick disfigured discolored nails with subungual debris noted bilateral entire nail hallux through fifth toenails Ulcer Exam: There is no evidence of ulcer or pre-ulcerative changes or infection. Orthopedic Exam: Muscle tone and strength are WNL. No limitations in general ROM. No crepitus or effusions noted. Foot type and digits show no abnormalities. Bony prominences are unremarkable. HAV deformity B/L Skin: No Porokeratosis. No infection or ulcers  Diagnosis:  Onychomycosis, , Pain in right toe, pain in left toes Diabetes with neuropathy. HAV  B/L,  Angiopathy  Treatment & Plan Procedures and Treatment: Consent by patient was obtained for treatment procedures. The patient understood the discussion of treatment and procedures well. All questions were answered thoroughly reviewed. Debridement of mycotic and hypertrophic toenails, 1 through 5 bilateral and clearing of subungual debris. No ulceration, no infection noted.  Patient qualifies for diabetic shoes due to DPN and HAV angiopathy. Return Visit-Office Procedure: Patient instructed to return to  the office for a follow up visit 3 months for continued evaluation and treatment.   Gardiner Barefoot DPM

## 2019-09-06 DIAGNOSIS — I5042 Chronic combined systolic (congestive) and diastolic (congestive) heart failure: Secondary | ICD-10-CM | POA: Diagnosis not present

## 2019-09-06 DIAGNOSIS — E782 Mixed hyperlipidemia: Secondary | ICD-10-CM | POA: Diagnosis not present

## 2019-09-06 DIAGNOSIS — I350 Nonrheumatic aortic (valve) stenosis: Secondary | ICD-10-CM | POA: Diagnosis not present

## 2019-09-06 DIAGNOSIS — F5105 Insomnia due to other mental disorder: Secondary | ICD-10-CM | POA: Diagnosis not present

## 2019-09-06 DIAGNOSIS — E870 Hyperosmolality and hypernatremia: Secondary | ICD-10-CM | POA: Diagnosis not present

## 2019-09-06 DIAGNOSIS — F0151 Vascular dementia with behavioral disturbance: Secondary | ICD-10-CM | POA: Diagnosis not present

## 2019-09-06 DIAGNOSIS — R600 Localized edema: Secondary | ICD-10-CM | POA: Diagnosis not present

## 2019-09-06 DIAGNOSIS — R944 Abnormal results of kidney function studies: Secondary | ICD-10-CM | POA: Diagnosis not present

## 2019-09-06 DIAGNOSIS — E1169 Type 2 diabetes mellitus with other specified complication: Secondary | ICD-10-CM | POA: Diagnosis not present

## 2019-09-06 DIAGNOSIS — D508 Other iron deficiency anemias: Secondary | ICD-10-CM | POA: Diagnosis not present

## 2019-09-06 DIAGNOSIS — M25569 Pain in unspecified knee: Secondary | ICD-10-CM | POA: Diagnosis not present

## 2019-10-21 DIAGNOSIS — I10 Essential (primary) hypertension: Secondary | ICD-10-CM | POA: Diagnosis not present

## 2019-10-21 DIAGNOSIS — D509 Iron deficiency anemia, unspecified: Secondary | ICD-10-CM | POA: Diagnosis not present

## 2019-10-21 DIAGNOSIS — E782 Mixed hyperlipidemia: Secondary | ICD-10-CM | POA: Diagnosis not present

## 2019-10-21 DIAGNOSIS — E1165 Type 2 diabetes mellitus with hyperglycemia: Secondary | ICD-10-CM | POA: Diagnosis not present

## 2019-10-21 DIAGNOSIS — E1169 Type 2 diabetes mellitus with other specified complication: Secondary | ICD-10-CM | POA: Diagnosis not present

## 2019-10-23 DIAGNOSIS — I350 Nonrheumatic aortic (valve) stenosis: Secondary | ICD-10-CM | POA: Diagnosis not present

## 2019-10-23 DIAGNOSIS — R944 Abnormal results of kidney function studies: Secondary | ICD-10-CM | POA: Diagnosis not present

## 2019-10-23 DIAGNOSIS — D508 Other iron deficiency anemias: Secondary | ICD-10-CM | POA: Diagnosis not present

## 2019-10-23 DIAGNOSIS — R600 Localized edema: Secondary | ICD-10-CM | POA: Diagnosis not present

## 2019-10-23 DIAGNOSIS — E870 Hyperosmolality and hypernatremia: Secondary | ICD-10-CM | POA: Diagnosis not present

## 2019-10-23 DIAGNOSIS — E1169 Type 2 diabetes mellitus with other specified complication: Secondary | ICD-10-CM | POA: Diagnosis not present

## 2019-10-23 DIAGNOSIS — F0151 Vascular dementia with behavioral disturbance: Secondary | ICD-10-CM | POA: Diagnosis not present

## 2019-10-23 DIAGNOSIS — I5042 Chronic combined systolic (congestive) and diastolic (congestive) heart failure: Secondary | ICD-10-CM | POA: Diagnosis not present

## 2019-10-23 DIAGNOSIS — F5105 Insomnia due to other mental disorder: Secondary | ICD-10-CM | POA: Diagnosis not present

## 2019-10-23 DIAGNOSIS — E782 Mixed hyperlipidemia: Secondary | ICD-10-CM | POA: Diagnosis not present

## 2019-10-23 DIAGNOSIS — M25569 Pain in unspecified knee: Secondary | ICD-10-CM | POA: Diagnosis not present

## 2019-10-24 ENCOUNTER — Other Ambulatory Visit: Payer: Self-pay

## 2019-10-24 NOTE — Patient Outreach (Signed)
Incoming EMMI Prevent call. Referred to Hubert Azure RN.

## 2019-10-28 DIAGNOSIS — E782 Mixed hyperlipidemia: Secondary | ICD-10-CM | POA: Diagnosis not present

## 2019-10-28 DIAGNOSIS — I5042 Chronic combined systolic (congestive) and diastolic (congestive) heart failure: Secondary | ICD-10-CM | POA: Diagnosis not present

## 2019-10-28 DIAGNOSIS — E870 Hyperosmolality and hypernatremia: Secondary | ICD-10-CM | POA: Diagnosis not present

## 2019-10-28 DIAGNOSIS — F0151 Vascular dementia with behavioral disturbance: Secondary | ICD-10-CM | POA: Diagnosis not present

## 2019-10-28 DIAGNOSIS — R197 Diarrhea, unspecified: Secondary | ICD-10-CM | POA: Diagnosis not present

## 2019-10-28 DIAGNOSIS — E1169 Type 2 diabetes mellitus with other specified complication: Secondary | ICD-10-CM | POA: Diagnosis not present

## 2019-10-28 DIAGNOSIS — R944 Abnormal results of kidney function studies: Secondary | ICD-10-CM | POA: Diagnosis not present

## 2019-10-28 DIAGNOSIS — I350 Nonrheumatic aortic (valve) stenosis: Secondary | ICD-10-CM | POA: Diagnosis not present

## 2019-10-28 DIAGNOSIS — R600 Localized edema: Secondary | ICD-10-CM | POA: Diagnosis not present

## 2019-10-28 DIAGNOSIS — Z0001 Encounter for general adult medical examination with abnormal findings: Secondary | ICD-10-CM | POA: Diagnosis not present

## 2019-10-28 DIAGNOSIS — F5105 Insomnia due to other mental disorder: Secondary | ICD-10-CM | POA: Diagnosis not present

## 2019-10-29 ENCOUNTER — Other Ambulatory Visit: Payer: Self-pay | Admitting: *Deleted

## 2019-10-29 NOTE — Patient Outreach (Signed)
[place] [facility]) Care Management  10/29/2019  SHAKEIA KRUS 31-Mar-1928 [id number]   Telephone Screen  Referral Date:  10/24/2019 Referral Source:  EMMI Prevent Reason for Referral:  Screening Insurance:   Outreach Attempt:  Outreach attempt #1 to patient for telephone screening.  Daughter, [name] answered and stated patient no longer talks on the telephone.  [name] stating she is unable to speak at this time and requested telephone call back at another date.   Plan:  RN Health Coach will make another outreach attempt within the next 3-4 business days if no return call back from daughter.   [place] [phone] [email or web address]

## 2019-10-30 ENCOUNTER — Other Ambulatory Visit: Payer: Self-pay | Admitting: *Deleted

## 2019-10-30 ENCOUNTER — Other Ambulatory Visit: Payer: Self-pay

## 2019-10-30 NOTE — Patient Outreach (Signed)
[facility] [facility]) Care Management  10/30/2019  Katherine [name] Dec 25, 1927 [id number]   Telephone Screen  Referral Date:  10/24/2019 Referral Source:  EMMI Prevent Reason for Referral:  Screening Insurance:  Medicare and Medicaid   Outreach Attempt:  Successful telephone outreach to patient's daughter, Katherine [name] for telephone screening.  HIPAA verified with Katherine [name], patient with history of dementia.  [facility] [place] [facility] services reviewed and discussed.  Katherine [name] stating she manages patients chronic conditions with the assistance of her home care aides through [place] and her and her other siblings assist with transporting to medical appointments.  Declines screening and [facility] services at this time.  Daughter does ask about a smaller home [employer] for patient, stating the current one in the home is hard to maneuver due to size.  Encouraged daughter to contact DME agency and request smaller lift for home use.  Daughter stated her understanding.  Agreeable to [facility] brochure to be mailed out.  Encouraged to contact [facility] in the future if needs arise.  Plan:  RN Health Coach will mail Successful Outreach Letter with [facility] Brochure.  RN Health Coach will close case and make patient inactive with [facility] as daughter declines services at this time.  [place] [phone] [email or web address]

## 2019-11-14 DIAGNOSIS — F064 Anxiety disorder due to known physiological condition: Secondary | ICD-10-CM | POA: Diagnosis not present

## 2019-11-14 DIAGNOSIS — F0151 Vascular dementia with behavioral disturbance: Secondary | ICD-10-CM | POA: Diagnosis not present

## 2019-11-26 ENCOUNTER — Other Ambulatory Visit: Payer: Self-pay

## 2019-11-26 ENCOUNTER — Ambulatory Visit (INDEPENDENT_AMBULATORY_CARE_PROVIDER_SITE_OTHER): Payer: Medicare Other | Admitting: Orthotics

## 2019-11-26 DIAGNOSIS — E119 Type 2 diabetes mellitus without complications: Secondary | ICD-10-CM

## 2019-11-26 DIAGNOSIS — B351 Tinea unguium: Secondary | ICD-10-CM

## 2019-11-26 DIAGNOSIS — M79675 Pain in left toe(s): Secondary | ICD-10-CM

## 2019-11-26 DIAGNOSIS — E1140 Type 2 diabetes mellitus with diabetic neuropathy, unspecified: Secondary | ICD-10-CM

## 2019-11-26 DIAGNOSIS — M79674 Pain in right toe(s): Secondary | ICD-10-CM

## 2019-11-26 NOTE — Progress Notes (Signed)

## 2019-11-28 ENCOUNTER — Other Ambulatory Visit: Payer: Self-pay | Admitting: Internal Medicine

## 2019-12-03 ENCOUNTER — Ambulatory Visit: Payer: Medicare Other | Admitting: Orthotics

## 2019-12-03 ENCOUNTER — Encounter: Payer: Self-pay | Admitting: Podiatry

## 2019-12-03 ENCOUNTER — Ambulatory Visit (INDEPENDENT_AMBULATORY_CARE_PROVIDER_SITE_OTHER): Payer: Medicare Other | Admitting: Podiatry

## 2019-12-03 ENCOUNTER — Other Ambulatory Visit: Payer: Self-pay

## 2019-12-03 DIAGNOSIS — M79675 Pain in left toe(s): Secondary | ICD-10-CM

## 2019-12-03 DIAGNOSIS — B351 Tinea unguium: Secondary | ICD-10-CM | POA: Diagnosis not present

## 2019-12-03 DIAGNOSIS — E119 Type 2 diabetes mellitus without complications: Secondary | ICD-10-CM | POA: Diagnosis not present

## 2019-12-03 DIAGNOSIS — M79674 Pain in right toe(s): Secondary | ICD-10-CM

## 2019-12-03 NOTE — Progress Notes (Signed)
Patient ID: Katherine [name], female   DOB: 1928/08/17, 85 y.o.   MRN: [id number] Complaint:  Visit Type: Patient returns to my office for continued preventative foot care services. Complaint: Patient states" my nails have grown long and thick and become painful to walk and wear shoes" Patient has been diagnosed with DM with no foot complications. The patient presents for preventative foot care services. No changes to ROS.  Patient is taking plavix.   She presents to the office with her daughter.  Podiatric Exam: Vascular: dorsalis pedis and posterior tibial pulses are not  palpable bilateral due to foot swelling.. Capillary return is immediate. Temperature gradient is WNL. Skin turgor WNL  Sensorium: Diminished Semmes Weinstein monofilament test. Normal tactile sensation bilaterally. Nail Exam: Pt has thick disfigured discolored nails with subungual debris noted bilateral entire nail hallux through fifth toenails Ulcer Exam: There is no evidence of ulcer or pre-ulcerative changes or infection. Orthopedic Exam: Muscle tone and strength are WNL. No limitations in general ROM. No crepitus or effusions noted. Foot type and digits show no abnormalities. Bony prominences are unremarkable. HAV deformity B/L Skin: No Porokeratosis. No infection or ulcers  Diagnosis:  Onychomycosis, , Pain in right toe, pain in left toes Diabetes with neuropathy. HAV  B/L,  Angiopathy  Treatment & Plan Procedures and Treatment: Consent by patient was obtained for treatment procedures. The patient understood the discussion of treatment and procedures well. All questions were answered thoroughly reviewed. Debridement of mycotic and hypertrophic toenails, 1 through 5 bilateral and clearing of subungual debris. No ulceration, no infection noted.  Return Visit-Office Procedure: Patient instructed to return to the office for a follow up visit 4 months for continued evaluation and treatment.   Gardiner Barefoot DPM

## 2019-12-11 ENCOUNTER — Ambulatory Visit: Payer: Medicare Other | Admitting: Internal Medicine

## 2019-12-21 DIAGNOSIS — R531 Weakness: Secondary | ICD-10-CM | POA: Diagnosis not present

## 2019-12-21 DIAGNOSIS — N390 Urinary tract infection, site not specified: Secondary | ICD-10-CM | POA: Diagnosis not present

## 2019-12-23 DIAGNOSIS — I700 Atherosclerosis of aorta: Secondary | ICD-10-CM | POA: Diagnosis not present

## 2019-12-23 DIAGNOSIS — I5042 Chronic combined systolic (congestive) and diastolic (congestive) heart failure: Secondary | ICD-10-CM | POA: Diagnosis not present

## 2019-12-23 DIAGNOSIS — F411 Generalized anxiety disorder: Secondary | ICD-10-CM | POA: Diagnosis not present

## 2019-12-23 DIAGNOSIS — R600 Localized edema: Secondary | ICD-10-CM | POA: Diagnosis not present

## 2019-12-23 DIAGNOSIS — F0390 Unspecified dementia without behavioral disturbance: Secondary | ICD-10-CM | POA: Diagnosis not present

## 2019-12-23 DIAGNOSIS — N2889 Other specified disorders of kidney and ureter: Secondary | ICD-10-CM | POA: Diagnosis not present

## 2019-12-23 DIAGNOSIS — I10 Essential (primary) hypertension: Secondary | ICD-10-CM | POA: Diagnosis not present

## 2019-12-23 DIAGNOSIS — E870 Hyperosmolality and hypernatremia: Secondary | ICD-10-CM | POA: Diagnosis not present

## 2019-12-23 DIAGNOSIS — E782 Mixed hyperlipidemia: Secondary | ICD-10-CM | POA: Diagnosis not present

## 2019-12-23 DIAGNOSIS — D509 Iron deficiency anemia, unspecified: Secondary | ICD-10-CM | POA: Diagnosis not present

## 2019-12-23 DIAGNOSIS — E119 Type 2 diabetes mellitus without complications: Secondary | ICD-10-CM | POA: Diagnosis not present

## 2019-12-24 DIAGNOSIS — R400 Somnolence: Secondary | ICD-10-CM | POA: Diagnosis not present

## 2019-12-24 DIAGNOSIS — N390 Urinary tract infection, site not specified: Secondary | ICD-10-CM | POA: Diagnosis not present

## 2019-12-25 DIAGNOSIS — Z7902 Long term (current) use of antithrombotics/antiplatelets: Secondary | ICD-10-CM | POA: Diagnosis not present

## 2019-12-25 DIAGNOSIS — N390 Urinary tract infection, site not specified: Secondary | ICD-10-CM | POA: Diagnosis not present

## 2019-12-25 DIAGNOSIS — Z794 Long term (current) use of insulin: Secondary | ICD-10-CM | POA: Diagnosis not present

## 2019-12-25 DIAGNOSIS — I10 Essential (primary) hypertension: Secondary | ICD-10-CM | POA: Diagnosis not present

## 2019-12-25 DIAGNOSIS — F0391 Unspecified dementia with behavioral disturbance: Secondary | ICD-10-CM | POA: Diagnosis not present

## 2019-12-25 DIAGNOSIS — E119 Type 2 diabetes mellitus without complications: Secondary | ICD-10-CM | POA: Diagnosis not present

## 2019-12-25 DIAGNOSIS — Z9181 History of falling: Secondary | ICD-10-CM | POA: Diagnosis not present

## 2019-12-26 DIAGNOSIS — I350 Nonrheumatic aortic (valve) stenosis: Secondary | ICD-10-CM | POA: Diagnosis not present

## 2019-12-26 DIAGNOSIS — I5042 Chronic combined systolic (congestive) and diastolic (congestive) heart failure: Secondary | ICD-10-CM | POA: Diagnosis not present

## 2019-12-26 DIAGNOSIS — I700 Atherosclerosis of aorta: Secondary | ICD-10-CM | POA: Diagnosis not present

## 2019-12-26 DIAGNOSIS — E119 Type 2 diabetes mellitus without complications: Secondary | ICD-10-CM | POA: Diagnosis not present

## 2019-12-26 DIAGNOSIS — I10 Essential (primary) hypertension: Secondary | ICD-10-CM | POA: Diagnosis not present

## 2019-12-26 DIAGNOSIS — I110 Hypertensive heart disease with heart failure: Secondary | ICD-10-CM | POA: Diagnosis not present

## 2019-12-26 DIAGNOSIS — F0151 Vascular dementia with behavioral disturbance: Secondary | ICD-10-CM | POA: Diagnosis not present

## 2019-12-26 DIAGNOSIS — R600 Localized edema: Secondary | ICD-10-CM | POA: Diagnosis not present

## 2019-12-26 DIAGNOSIS — E1165 Type 2 diabetes mellitus with hyperglycemia: Secondary | ICD-10-CM | POA: Diagnosis not present

## 2019-12-27 DIAGNOSIS — I10 Essential (primary) hypertension: Secondary | ICD-10-CM | POA: Diagnosis not present

## 2019-12-27 DIAGNOSIS — Z794 Long term (current) use of insulin: Secondary | ICD-10-CM | POA: Diagnosis not present

## 2019-12-27 DIAGNOSIS — F0391 Unspecified dementia with behavioral disturbance: Secondary | ICD-10-CM | POA: Diagnosis not present

## 2019-12-27 DIAGNOSIS — E119 Type 2 diabetes mellitus without complications: Secondary | ICD-10-CM | POA: Diagnosis not present

## 2019-12-27 DIAGNOSIS — Z7902 Long term (current) use of antithrombotics/antiplatelets: Secondary | ICD-10-CM | POA: Diagnosis not present

## 2019-12-27 DIAGNOSIS — N390 Urinary tract infection, site not specified: Secondary | ICD-10-CM | POA: Diagnosis not present

## 2019-12-30 DIAGNOSIS — F0391 Unspecified dementia with behavioral disturbance: Secondary | ICD-10-CM | POA: Diagnosis not present

## 2019-12-30 DIAGNOSIS — N390 Urinary tract infection, site not specified: Secondary | ICD-10-CM | POA: Diagnosis not present

## 2019-12-30 DIAGNOSIS — E119 Type 2 diabetes mellitus without complications: Secondary | ICD-10-CM | POA: Diagnosis not present

## 2019-12-30 DIAGNOSIS — Z7902 Long term (current) use of antithrombotics/antiplatelets: Secondary | ICD-10-CM | POA: Diagnosis not present

## 2019-12-30 DIAGNOSIS — I10 Essential (primary) hypertension: Secondary | ICD-10-CM | POA: Diagnosis not present

## 2019-12-30 DIAGNOSIS — Z794 Long term (current) use of insulin: Secondary | ICD-10-CM | POA: Diagnosis not present

## 2020-01-01 DIAGNOSIS — I10 Essential (primary) hypertension: Secondary | ICD-10-CM | POA: Diagnosis not present

## 2020-01-01 DIAGNOSIS — N390 Urinary tract infection, site not specified: Secondary | ICD-10-CM | POA: Diagnosis not present

## 2020-01-01 DIAGNOSIS — E119 Type 2 diabetes mellitus without complications: Secondary | ICD-10-CM | POA: Diagnosis not present

## 2020-01-01 DIAGNOSIS — F0391 Unspecified dementia with behavioral disturbance: Secondary | ICD-10-CM | POA: Diagnosis not present

## 2020-01-01 DIAGNOSIS — Z794 Long term (current) use of insulin: Secondary | ICD-10-CM | POA: Diagnosis not present

## 2020-01-01 DIAGNOSIS — Z7902 Long term (current) use of antithrombotics/antiplatelets: Secondary | ICD-10-CM | POA: Diagnosis not present

## 2020-01-06 DIAGNOSIS — Z7902 Long term (current) use of antithrombotics/antiplatelets: Secondary | ICD-10-CM | POA: Diagnosis not present

## 2020-01-06 DIAGNOSIS — E119 Type 2 diabetes mellitus without complications: Secondary | ICD-10-CM | POA: Diagnosis not present

## 2020-01-06 DIAGNOSIS — Z794 Long term (current) use of insulin: Secondary | ICD-10-CM | POA: Diagnosis not present

## 2020-01-06 DIAGNOSIS — I10 Essential (primary) hypertension: Secondary | ICD-10-CM | POA: Diagnosis not present

## 2020-01-06 DIAGNOSIS — N390 Urinary tract infection, site not specified: Secondary | ICD-10-CM | POA: Diagnosis not present

## 2020-01-06 DIAGNOSIS — F0391 Unspecified dementia with behavioral disturbance: Secondary | ICD-10-CM | POA: Diagnosis not present

## 2020-01-09 DIAGNOSIS — E119 Type 2 diabetes mellitus without complications: Secondary | ICD-10-CM | POA: Diagnosis not present

## 2020-01-09 DIAGNOSIS — Z7902 Long term (current) use of antithrombotics/antiplatelets: Secondary | ICD-10-CM | POA: Diagnosis not present

## 2020-01-09 DIAGNOSIS — Z794 Long term (current) use of insulin: Secondary | ICD-10-CM | POA: Diagnosis not present

## 2020-01-09 DIAGNOSIS — N390 Urinary tract infection, site not specified: Secondary | ICD-10-CM | POA: Diagnosis not present

## 2020-01-09 DIAGNOSIS — F0391 Unspecified dementia with behavioral disturbance: Secondary | ICD-10-CM | POA: Diagnosis not present

## 2020-01-09 DIAGNOSIS — I10 Essential (primary) hypertension: Secondary | ICD-10-CM | POA: Diagnosis not present

## 2020-01-13 DIAGNOSIS — E119 Type 2 diabetes mellitus without complications: Secondary | ICD-10-CM | POA: Diagnosis not present

## 2020-01-13 DIAGNOSIS — Z7902 Long term (current) use of antithrombotics/antiplatelets: Secondary | ICD-10-CM | POA: Diagnosis not present

## 2020-01-13 DIAGNOSIS — Z794 Long term (current) use of insulin: Secondary | ICD-10-CM | POA: Diagnosis not present

## 2020-01-13 DIAGNOSIS — N390 Urinary tract infection, site not specified: Secondary | ICD-10-CM | POA: Diagnosis not present

## 2020-01-13 DIAGNOSIS — F0391 Unspecified dementia with behavioral disturbance: Secondary | ICD-10-CM | POA: Diagnosis not present

## 2020-01-13 DIAGNOSIS — I10 Essential (primary) hypertension: Secondary | ICD-10-CM | POA: Diagnosis not present

## 2020-01-16 DIAGNOSIS — N390 Urinary tract infection, site not specified: Secondary | ICD-10-CM | POA: Diagnosis not present

## 2020-01-16 DIAGNOSIS — F0391 Unspecified dementia with behavioral disturbance: Secondary | ICD-10-CM | POA: Diagnosis not present

## 2020-01-16 DIAGNOSIS — Z794 Long term (current) use of insulin: Secondary | ICD-10-CM | POA: Diagnosis not present

## 2020-01-16 DIAGNOSIS — E119 Type 2 diabetes mellitus without complications: Secondary | ICD-10-CM | POA: Diagnosis not present

## 2020-01-16 DIAGNOSIS — Z7902 Long term (current) use of antithrombotics/antiplatelets: Secondary | ICD-10-CM | POA: Diagnosis not present

## 2020-01-16 DIAGNOSIS — I10 Essential (primary) hypertension: Secondary | ICD-10-CM | POA: Diagnosis not present

## 2020-01-21 DIAGNOSIS — E119 Type 2 diabetes mellitus without complications: Secondary | ICD-10-CM | POA: Diagnosis not present

## 2020-01-21 DIAGNOSIS — F0391 Unspecified dementia with behavioral disturbance: Secondary | ICD-10-CM | POA: Diagnosis not present

## 2020-01-21 DIAGNOSIS — Z7902 Long term (current) use of antithrombotics/antiplatelets: Secondary | ICD-10-CM | POA: Diagnosis not present

## 2020-01-21 DIAGNOSIS — Z794 Long term (current) use of insulin: Secondary | ICD-10-CM | POA: Diagnosis not present

## 2020-01-21 DIAGNOSIS — N390 Urinary tract infection, site not specified: Secondary | ICD-10-CM | POA: Diagnosis not present

## 2020-01-21 DIAGNOSIS — I10 Essential (primary) hypertension: Secondary | ICD-10-CM | POA: Diagnosis not present

## 2020-01-24 DIAGNOSIS — E119 Type 2 diabetes mellitus without complications: Secondary | ICD-10-CM | POA: Diagnosis not present

## 2020-01-24 DIAGNOSIS — Z794 Long term (current) use of insulin: Secondary | ICD-10-CM | POA: Diagnosis not present

## 2020-01-24 DIAGNOSIS — I10 Essential (primary) hypertension: Secondary | ICD-10-CM | POA: Diagnosis not present

## 2020-01-24 DIAGNOSIS — Z9181 History of falling: Secondary | ICD-10-CM | POA: Diagnosis not present

## 2020-01-24 DIAGNOSIS — F0391 Unspecified dementia with behavioral disturbance: Secondary | ICD-10-CM | POA: Diagnosis not present

## 2020-01-24 DIAGNOSIS — Z7902 Long term (current) use of antithrombotics/antiplatelets: Secondary | ICD-10-CM | POA: Diagnosis not present

## 2020-01-24 DIAGNOSIS — N390 Urinary tract infection, site not specified: Secondary | ICD-10-CM | POA: Diagnosis not present

## 2020-01-27 DIAGNOSIS — Z794 Long term (current) use of insulin: Secondary | ICD-10-CM | POA: Diagnosis not present

## 2020-01-27 DIAGNOSIS — N390 Urinary tract infection, site not specified: Secondary | ICD-10-CM | POA: Diagnosis not present

## 2020-01-27 DIAGNOSIS — I10 Essential (primary) hypertension: Secondary | ICD-10-CM | POA: Diagnosis not present

## 2020-01-27 DIAGNOSIS — Z7902 Long term (current) use of antithrombotics/antiplatelets: Secondary | ICD-10-CM | POA: Diagnosis not present

## 2020-01-27 DIAGNOSIS — E119 Type 2 diabetes mellitus without complications: Secondary | ICD-10-CM | POA: Diagnosis not present

## 2020-01-27 DIAGNOSIS — F0391 Unspecified dementia with behavioral disturbance: Secondary | ICD-10-CM | POA: Diagnosis not present

## 2020-02-03 DIAGNOSIS — N390 Urinary tract infection, site not specified: Secondary | ICD-10-CM | POA: Diagnosis not present

## 2020-02-03 DIAGNOSIS — I10 Essential (primary) hypertension: Secondary | ICD-10-CM | POA: Diagnosis not present

## 2020-02-03 DIAGNOSIS — Z7902 Long term (current) use of antithrombotics/antiplatelets: Secondary | ICD-10-CM | POA: Diagnosis not present

## 2020-02-03 DIAGNOSIS — E119 Type 2 diabetes mellitus without complications: Secondary | ICD-10-CM | POA: Diagnosis not present

## 2020-02-03 DIAGNOSIS — Z794 Long term (current) use of insulin: Secondary | ICD-10-CM | POA: Diagnosis not present

## 2020-02-03 DIAGNOSIS — F0391 Unspecified dementia with behavioral disturbance: Secondary | ICD-10-CM | POA: Diagnosis not present

## 2020-02-06 DIAGNOSIS — I700 Atherosclerosis of aorta: Secondary | ICD-10-CM | POA: Diagnosis not present

## 2020-02-06 DIAGNOSIS — F0390 Unspecified dementia without behavioral disturbance: Secondary | ICD-10-CM | POA: Diagnosis not present

## 2020-02-06 DIAGNOSIS — E119 Type 2 diabetes mellitus without complications: Secondary | ICD-10-CM | POA: Diagnosis not present

## 2020-02-06 DIAGNOSIS — E782 Mixed hyperlipidemia: Secondary | ICD-10-CM | POA: Diagnosis not present

## 2020-02-06 DIAGNOSIS — I10 Essential (primary) hypertension: Secondary | ICD-10-CM | POA: Diagnosis not present

## 2020-02-06 DIAGNOSIS — R600 Localized edema: Secondary | ICD-10-CM | POA: Diagnosis not present

## 2020-02-06 DIAGNOSIS — I5042 Chronic combined systolic (congestive) and diastolic (congestive) heart failure: Secondary | ICD-10-CM | POA: Diagnosis not present

## 2020-02-06 DIAGNOSIS — D509 Iron deficiency anemia, unspecified: Secondary | ICD-10-CM | POA: Diagnosis not present

## 2020-02-06 DIAGNOSIS — F411 Generalized anxiety disorder: Secondary | ICD-10-CM | POA: Diagnosis not present

## 2020-02-06 DIAGNOSIS — E870 Hyperosmolality and hypernatremia: Secondary | ICD-10-CM | POA: Diagnosis not present

## 2020-02-06 DIAGNOSIS — N2889 Other specified disorders of kidney and ureter: Secondary | ICD-10-CM | POA: Diagnosis not present

## 2020-02-11 DIAGNOSIS — I350 Nonrheumatic aortic (valve) stenosis: Secondary | ICD-10-CM | POA: Diagnosis not present

## 2020-02-11 DIAGNOSIS — E870 Hyperosmolality and hypernatremia: Secondary | ICD-10-CM | POA: Diagnosis not present

## 2020-02-11 DIAGNOSIS — E119 Type 2 diabetes mellitus without complications: Secondary | ICD-10-CM | POA: Diagnosis not present

## 2020-02-11 DIAGNOSIS — N390 Urinary tract infection, site not specified: Secondary | ICD-10-CM | POA: Diagnosis not present

## 2020-02-11 DIAGNOSIS — R944 Abnormal results of kidney function studies: Secondary | ICD-10-CM | POA: Diagnosis not present

## 2020-02-11 DIAGNOSIS — Z7902 Long term (current) use of antithrombotics/antiplatelets: Secondary | ICD-10-CM | POA: Diagnosis not present

## 2020-02-11 DIAGNOSIS — I5042 Chronic combined systolic (congestive) and diastolic (congestive) heart failure: Secondary | ICD-10-CM | POA: Diagnosis not present

## 2020-02-11 DIAGNOSIS — R197 Diarrhea, unspecified: Secondary | ICD-10-CM | POA: Diagnosis not present

## 2020-02-11 DIAGNOSIS — F0391 Unspecified dementia with behavioral disturbance: Secondary | ICD-10-CM | POA: Diagnosis not present

## 2020-02-11 DIAGNOSIS — R600 Localized edema: Secondary | ICD-10-CM | POA: Diagnosis not present

## 2020-02-11 DIAGNOSIS — Z794 Long term (current) use of insulin: Secondary | ICD-10-CM | POA: Diagnosis not present

## 2020-02-11 DIAGNOSIS — N2889 Other specified disorders of kidney and ureter: Secondary | ICD-10-CM | POA: Diagnosis not present

## 2020-02-11 DIAGNOSIS — F5105 Insomnia due to other mental disorder: Secondary | ICD-10-CM | POA: Diagnosis not present

## 2020-02-11 DIAGNOSIS — F0151 Vascular dementia with behavioral disturbance: Secondary | ICD-10-CM | POA: Diagnosis not present

## 2020-02-11 DIAGNOSIS — I10 Essential (primary) hypertension: Secondary | ICD-10-CM | POA: Diagnosis not present

## 2020-02-11 DIAGNOSIS — E782 Mixed hyperlipidemia: Secondary | ICD-10-CM | POA: Diagnosis not present

## 2020-02-11 DIAGNOSIS — E1169 Type 2 diabetes mellitus with other specified complication: Secondary | ICD-10-CM | POA: Diagnosis not present

## 2020-02-13 ENCOUNTER — Telehealth: Payer: Self-pay

## 2020-02-13 ENCOUNTER — Telehealth (INDEPENDENT_AMBULATORY_CARE_PROVIDER_SITE_OTHER): Payer: Medicare Other | Admitting: Physician Assistant

## 2020-02-13 ENCOUNTER — Encounter: Payer: Self-pay | Admitting: Physician Assistant

## 2020-02-13 ENCOUNTER — Encounter (INDEPENDENT_AMBULATORY_CARE_PROVIDER_SITE_OTHER): Payer: Self-pay

## 2020-02-13 VITALS — BP 130/58 | HR 69 | Wt 188.0 lb

## 2020-02-13 DIAGNOSIS — E785 Hyperlipidemia, unspecified: Secondary | ICD-10-CM

## 2020-02-13 DIAGNOSIS — I25810 Atherosclerosis of coronary artery bypass graft(s) without angina pectoris: Secondary | ICD-10-CM | POA: Diagnosis not present

## 2020-02-13 DIAGNOSIS — Z953 Presence of xenogenic heart valve: Secondary | ICD-10-CM

## 2020-02-13 DIAGNOSIS — E119 Type 2 diabetes mellitus without complications: Secondary | ICD-10-CM

## 2020-02-13 DIAGNOSIS — I10 Essential (primary) hypertension: Secondary | ICD-10-CM

## 2020-02-13 NOTE — Telephone Encounter (Signed)
Tried calling the patient to get her prepared for her 11:15 virtual visit with Almyra Deforest, PA-C and to give our office a call back when she gets this message.

## 2020-02-13 NOTE — Progress Notes (Signed)
Virtual Visit via Telephone Note   This visit type was conducted due to national recommendations for restrictions regarding the COVID-19 Pandemic (e.g. social distancing) in an effort to limit this patient's exposure and mitigate transmission in our community.  Due to her co-morbid illnesses, this patient is at least at moderate risk for complications without adequate follow up.  This format is felt to be most appropriate for this patient at this time.  The patient did not have access to video technology/had technical difficulties with video requiring transitioning to audio format only (telephone).  All issues noted in this document were discussed and addressed.  No physical exam could be performed with this format.  Please refer to the patient's chart for her  consent to telehealth for [facility].   The patient was identified using 2 identifiers.  Date:  02/15/2020   ID:  Katherine [name], DOB January 27, 1928, MRN [id number]  Patient Location: Home Provider Location: Home  PCP:  Celene Squibb, MD  Cardiologist:  [name], MD  Electrophysiologist:  None   Evaluation Performed:  Follow-Up Visit  Chief Complaint:  followup  History of Present Illness:    Katherine [name] is a 86 y.o. female with PMH of CAD s/p CABG x 4 (LIMA-LAD, SVG-D, SVG-LCx, and SVG-PDA) 2003, morbid obesity, HTN, HLD, DM II, and h/o severe AS s/p TAVR on 05/26/2015.  She did have a low risk Myoview on 12/27/2013.  Due to worsening aortic stenosis, she underwent cardiac catheterization on 03/24/2015 by Dr. Angelena Form, this revealed diffuse 20% disease in the left main, diffuse 60% mid LAD lesion followed by subtotal occlusion, patent LIMA to mid and distal LAD, 90% mid left circumflex lesion, 100% mid RCA occlusion, patent SVG to left circumflex, patent SVG to PDA and patent SVG to diagonal.  She subsequently underwent TAVR by Dr. Cyndia Bent on 05/26/2015 with Oletta Lamas Sapien 3 THV size 23 mm bioprosthetic valve.  Echocardiogram obtained on 05/27/2016 showed EF 60-65%, stable bioprosthetic aortic valve with no significant regurgitation, mild MR, PA peak pressure 49 mmHg.  Patient was last seen by Dr. Debara Pickett in 2019, repeat echocardiogram obtained on 10/18/2018 showed EF 65 to 70%, grade 1 DD, mild to moderate posterior wall and the severe septal hypertrophy, leaflet of the aortic valve was poorly visualized and that there was trivial regurgitation, mild MR, PA peak pressure 43 mmHg.  Patient presents today for virtual visit.  The majority of the interview was conducted with the help of her daughter.  Unfortunately due to significant dementia, she did not communicate much.  She denies any significant chest discomfort or shortness of breath.  At this time, I did not order a repeat echocardiogram as she likely would not be a good candidate for any invasive study in the future given her significant dementia.  The patient does not have symptoms concerning for COVID-19 infection (fever, chills, cough, or new shortness of breath).    Past Medical History:  Diagnosis Date  . Anemia   . Anxiety   . Aortic valvular stenosis    moderate to severe  EF on 2D Echo 10/22/2012 65-70%  valve area of 1.2cm, and peak and mean gradients of 45 and 28mmHg  . Chalazion of right upper eyelid early June 2016   treating with cream, Rx by Dr. Merlyn Albert    . CHF (congestive heart failure) ([place])   . Coronary artery disease   . Dementia ([place])   . Diabetes mellitus, type 2 ([place])   .  Dyspnea    chronic  . Easy fatigability   . Gastric mass   . Gastric ulcer   . GERD (gastroesophageal reflux disease)   . Heart murmur   . Hernia of unspecified site of abdominal cavity without mention of obstruction or gangrene    hiatal  . Hyperlipidemia   . Hypertension   . Normal cardiac stress test 12/2013   normal stress nuclear study  . Osteoarthritis   . Thyroid mass    Past Surgical History:  Procedure Laterality Date  . AORTIC VALVE  SURGERY    . CATARACT EXTRACTION Bilateral   . CORONARY ARTERY BYPASS GRAFT     x4 in 2003  . internal hemorrhoidectomy    . KNEE SURGERY Right   . LEFT AND RIGHT HEART CATHETERIZATION WITH CORONARY/GRAFT ANGIOGRAM N/A 03/24/2015   Procedure: LEFT AND RIGHT HEART CATHETERIZATION WITH Beatrix Fetters;  Surgeon: Burnell Blanks, MD;  Location: [facility] CATH LAB;  Service: Cardiovascular;  Laterality: N/A;  . TEE WITHOUT CARDIOVERSION N/A 05/26/2015   Procedure: TRANSESOPHAGEAL ECHOCARDIOGRAM (TEE);  Surgeon: Burnell Blanks, MD;  Location: [place];  Service: Open Heart Surgery;  Laterality: N/A;  . TRANSCATHETER AORTIC VALVE REPLACEMENT, TRANSFEMORAL N/A 05/26/2015   Procedure: TRANSCATHETER AORTIC VALVE REPLACEMENT, TRANSFEMORAL;  Surgeon: Burnell Blanks, MD;  Location: [place];  Service: Open Heart Surgery;  Laterality: N/A;  . TUBAL LIGATION    . uterine polypectomy       Current Meds  Medication Sig  . acetaminophen (TYLENOL) 500 MG tablet Take two tablets by mouth twice daily for pain. Do not exceed 4gm of Tylenol in 24 hours  . ALPRAZolam (XANAX) 0.5 MG tablet Take 0.5 mg by mouth 3 (three) times daily as needed.  Marland Kitchen atorvastatin (LIPITOR) 80 MG tablet Take 80 mg by mouth daily.   . clopidogrel (PLAVIX) 75 MG tablet Take 1 tablet (75 mg total) by mouth daily with breakfast.  . donepezil (ARICEPT) 10 MG tablet Take 10 mg by mouth at bedtime.  Marland Kitchen esomeprazole (NEXIUM) 20 MG capsule Take 20 mg by mouth daily at 12 noon.  . furosemide (LASIX) 20 MG tablet Take 2 tablets (40 mg total) by mouth daily. PT OVERDUE FOR OV PLEASE CALL FOR APPT (Patient taking differently: Take 20 mg by mouth 2 (two) times daily. )  . hydrALAZINE (APRESOLINE) 50 MG tablet Take 1 tablet (50 mg total) by mouth every 8 (eight) hours. (Patient taking differently: Take 50 mg by mouth every 8 (eight) hours. For blood pressure)  . insulin detemir (LEVEMIR) 100 UNIT/ML injection Inject 0.1 mLs (10 Units  total) into the skin at bedtime. (Patient taking differently: Inject 12 Units into the skin at bedtime. )  . lactulose (CHRONULAC) 10 GM/15ML solution Take 45 mLs (30 g total) by mouth daily as needed for mild constipation or severe constipation.  . Linaclotide (LINZESS) 145 MCG CAPS capsule Take 145 mcg by mouth daily as needed (for constipation).  Marland Kitchen lisinopril (PRINIVIL,ZESTRIL) 10 MG tablet Take 1 tablet (10 mg total) by mouth daily.  Marland Kitchen loperamide (IMODIUM) 2 MG capsule Take 2 mg by mouth as needed for diarrhea or loose stools.  . Memantine HCl ER (NAMENDA XR) 28 MG CP24 Take one capsule by mouth every morning for dementia  . metFORMIN (GLUCOPHAGE-XR) 500 MG 24 hr tablet Take 500 mg by mouth in the morning and at bedtime. To control blood sugar. Do not crush.  . nebivolol (BYSTOLIC) 10 MG tablet Take one tablet by mouth every  morning for blood pressure  . NYSTATIN powder Apply 1 application topically 2 (two) times daily as needed for for breast care.  . potassium chloride SA (K-DUR,KLOR-CON) 20 MEQ tablet Take 20 mEq by mouth 2 (two) times daily.      Allergies:   Tape   Social History   Tobacco Use  . Smoking status: Never Smoker  . Smokeless tobacco: Never Used  Substance Use Topics  . Alcohol use: No  . Drug use: No     Family Hx: The patient's family history includes Cerebral aneurysm in her mother; Diabetes in her mother; Heart attack in her brother; Heart disease in her mother; Stroke in her father and mother. There is no history of Colon cancer.  ROS:   Please see the history of present illness.     All other systems reviewed and are negative.   Prior CV studies:   The following studies were reviewed today:  Echo 10/18/2018 LV EF: 65% -  70%   Study Conclusions   - Left ventricle: The cavity size was normal. Systolic function was  vigorous. The estimated ejection fraction was in the range of 65%  to 70%. Wall motion was normal; there were no regional wall    motion abnormalities. Doppler parameters are consistent with  abnormal left ventricular relaxation (grade 1 diastolic  dysfunction). Doppler parameters are consistent with high  ventricular filling pressure. Mild to moderate posterior wall and  severe septal hypertrophy.  - Aortic valve: History of TAVR. Leaflets poorly visualized. There  was trivial regurgitation. Peak velocity (S): 254 cm/s. Mean  gradient (S): 12 mm Hg. Valve area (VTI): 1.66 cm^2. Valve area  (Vmax): 1.5 cm^2. Valve area (Vmean): 1.82 cm^2.  - Mitral valve: Moderately thickened annulus. There was mild  regurgitation.  - Tricuspid valve: There was mild regurgitation.  - Pulmonary arteries: PA peak pressure: 43 mm Hg (S).   Labs/Other Tests and Data Reviewed:    EKG:  An ECG dated 10/16/2018 was personally reviewed today and demonstrated:  Normal sinus rhythm without significant ST-T wave changes  Recent Labs: No results found for requested labs within last 8760 hours.   Recent Lipid Panel No results found for: CHOL, TRIG, HDL, CHOLHDL, LDLCALC, LDLDIRECT  Wt Readings from Last 3 Encounters:  02/13/20 188 lb (85.3 kg)  06/26/18 207 lb (93.9 kg)  03/19/18 207 lb (93.9 kg)     Objective:    Vital Signs:  BP (!) 130/58   Pulse 69   Wt 188 lb (85.3 kg)   BMI 34.39 kg/m    VITAL SIGNS:  reviewed  ASSESSMENT & PLAN:    1. CAD s/p CABG: Denies any chest pain.  Continue Plavix and Lipitor  2. Hypertension: Continue on current therapy  3. Hyperlipidemia: On Lipitor  4. DM2: Managed per primary care provider  5. Severe aortic stenosis s/p TAVR on 05/26/2015: Given her significant dementia, I will hold off on reordering repeat echocardiogram as she would not be a good surgical candidate.  COVID-19 Education: The signs and symptoms of COVID-19 were discussed with the patient and how to seek care for testing (follow up with PCP or arrange E-visit).  The importance of social distancing was  discussed today.  Time:   Today, I have spent 20 minutes with the patient with telehealth technology discussing the above problems.     Medication Adjustments/Labs and Tests Ordered: Current medicines are reviewed at length with the patient today.  Concerns regarding medicines are outlined above.  Tests Ordered: No orders of the defined types were placed in this encounter.   Medication Changes: No orders of the defined types were placed in this encounter.   Follow Up:  Either In Person or Virtual in 1 year(s)  Signed, Almyra Deforest, [place]  02/15/2020 11:15 PM    [facility]

## 2020-02-13 NOTE — Telephone Encounter (Signed)
Called patient's daughter to discuss AVS instructions gave Katherine [name]'s recommendations and patient voiced understanding. AVS summary mailed to patient.

## 2020-02-13 NOTE — Patient Instructions (Signed)
Medication Instructions:  Your physician recommends that you continue on your current medications as directed. Please refer to the Current Medication list given to you today.  *If you need a refill on your cardiac medications before your next appointment, please call your pharmacy*  Lab Work: NONE ordered at this time of appointment   If you have labs (blood work) drawn today and your tests are completely normal, you will receive your results only by: MyChart Message (if you have MyChart) OR A paper copy in the mail If you have any lab test that is abnormal or we need to change your treatment, we will call you to review the results.  Testing/Procedures: NONE ordered at this time of appointment   Follow-Up: At [facility], you and your health needs are our priority.  As part of our continuing mission to provide you with exceptional heart care, we have created designated Provider Care Teams.  These Care Teams include your primary Cardiologist (physician) and Advanced Practice Providers (APPs -  Physician Assistants and Nurse Practitioners) who all work together to provide you with the care you need, when you need it.  We recommend signing up for the patient portal called "MyChart".  Sign up information is provided on this After Visit Summary.  MyChart is used to connect with patients for Virtual Visits (Telemedicine).  Patients are able to view lab/test results, encounter notes, upcoming appointments, etc.  Non-urgent messages can be sent to your provider as well.   To learn more about what you can do with MyChart, go to [email or web address].    Your next appointment:   1 year(s)  The format for your next appointment:   In Person  Provider:   K. Chad Hilty, MD  Other Instructions   

## 2020-02-20 ENCOUNTER — Other Ambulatory Visit: Payer: Self-pay | Admitting: Internal Medicine

## 2020-02-22 ENCOUNTER — Inpatient Hospital Stay (HOSPITAL_COMMUNITY)
Admission: AD | Admit: 2020-02-22 | Discharge: 2020-03-02 | DRG: 375 | Disposition: A | Discharge status: Skilled Nursing Facility | Payer: Medicare Other | Source: Other Acute Inpatient Hospital | Attending: Internal Medicine | Admitting: Internal Medicine

## 2020-02-22 ENCOUNTER — Encounter (HOSPITAL_COMMUNITY): Payer: Self-pay | Admitting: Family Medicine

## 2020-02-22 DIAGNOSIS — I110 Hypertensive heart disease with heart failure: Secondary | ICD-10-CM | POA: Diagnosis present

## 2020-02-22 DIAGNOSIS — Z515 Encounter for palliative care: Secondary | ICD-10-CM | POA: Diagnosis not present

## 2020-02-22 DIAGNOSIS — F0280 Dementia in other diseases classified elsewhere without behavioral disturbance: Secondary | ICD-10-CM | POA: Diagnosis not present

## 2020-02-22 DIAGNOSIS — Z8249 Family history of ischemic heart disease and other diseases of the circulatory system: Secondary | ICD-10-CM | POA: Diagnosis not present

## 2020-02-22 DIAGNOSIS — Z7401 Bed confinement status: Secondary | ICD-10-CM | POA: Diagnosis not present

## 2020-02-22 DIAGNOSIS — K922 Gastrointestinal hemorrhage, unspecified: Secondary | ICD-10-CM

## 2020-02-22 DIAGNOSIS — D490 Neoplasm of unspecified behavior of digestive system: Secondary | ICD-10-CM | POA: Diagnosis not present

## 2020-02-22 DIAGNOSIS — Z66 Do not resuscitate: Secondary | ICD-10-CM | POA: Diagnosis not present

## 2020-02-22 DIAGNOSIS — K449 Diaphragmatic hernia without obstruction or gangrene: Secondary | ICD-10-CM | POA: Diagnosis not present

## 2020-02-22 DIAGNOSIS — E870 Hyperosmolality and hypernatremia: Secondary | ICD-10-CM | POA: Diagnosis present

## 2020-02-22 DIAGNOSIS — F0390 Unspecified dementia without behavioral disturbance: Secondary | ICD-10-CM | POA: Diagnosis present

## 2020-02-22 DIAGNOSIS — Z79899 Other long term (current) drug therapy: Secondary | ICD-10-CM

## 2020-02-22 DIAGNOSIS — K222 Esophageal obstruction: Secondary | ICD-10-CM

## 2020-02-22 DIAGNOSIS — Z794 Long term (current) use of insulin: Secondary | ICD-10-CM

## 2020-02-22 DIAGNOSIS — E785 Hyperlipidemia, unspecified: Secondary | ICD-10-CM | POA: Diagnosis not present

## 2020-02-22 DIAGNOSIS — N3000 Acute cystitis without hematuria: Secondary | ICD-10-CM | POA: Diagnosis present

## 2020-02-22 DIAGNOSIS — R58 Hemorrhage, not elsewhere classified: Secondary | ICD-10-CM | POA: Diagnosis not present

## 2020-02-22 DIAGNOSIS — R41841 Cognitive communication deficit: Secondary | ICD-10-CM | POA: Diagnosis not present

## 2020-02-22 DIAGNOSIS — Z20822 Contact with and (suspected) exposure to covid-19: Secondary | ICD-10-CM | POA: Diagnosis not present

## 2020-02-22 DIAGNOSIS — Z951 Presence of aortocoronary bypass graft: Secondary | ICD-10-CM | POA: Diagnosis not present

## 2020-02-22 DIAGNOSIS — I5032 Chronic diastolic (congestive) heart failure: Secondary | ICD-10-CM | POA: Diagnosis present

## 2020-02-22 DIAGNOSIS — I2510 Atherosclerotic heart disease of native coronary artery without angina pectoris: Secondary | ICD-10-CM | POA: Diagnosis present

## 2020-02-22 DIAGNOSIS — M1990 Unspecified osteoarthritis, unspecified site: Secondary | ICD-10-CM | POA: Diagnosis not present

## 2020-02-22 DIAGNOSIS — E1165 Type 2 diabetes mellitus with hyperglycemia: Secondary | ICD-10-CM | POA: Diagnosis present

## 2020-02-22 DIAGNOSIS — E1169 Type 2 diabetes mellitus with other specified complication: Secondary | ICD-10-CM | POA: Diagnosis not present

## 2020-02-22 DIAGNOSIS — N179 Acute kidney failure, unspecified: Secondary | ICD-10-CM | POA: Diagnosis not present

## 2020-02-22 DIAGNOSIS — N390 Urinary tract infection, site not specified: Secondary | ICD-10-CM | POA: Diagnosis not present

## 2020-02-22 DIAGNOSIS — M2550 Pain in unspecified joint: Secondary | ICD-10-CM | POA: Diagnosis not present

## 2020-02-22 DIAGNOSIS — R531 Weakness: Secondary | ICD-10-CM | POA: Diagnosis not present

## 2020-02-22 DIAGNOSIS — R52 Pain, unspecified: Secondary | ICD-10-CM | POA: Diagnosis not present

## 2020-02-22 DIAGNOSIS — K219 Gastro-esophageal reflux disease without esophagitis: Secondary | ICD-10-CM | POA: Diagnosis present

## 2020-02-22 DIAGNOSIS — F411 Generalized anxiety disorder: Secondary | ICD-10-CM | POA: Diagnosis present

## 2020-02-22 DIAGNOSIS — Z953 Presence of xenogenic heart valve: Secondary | ICD-10-CM

## 2020-02-22 DIAGNOSIS — F03C0 Unspecified dementia, severe, without behavioral disturbance, psychotic disturbance, mood disturbance, and anxiety: Secondary | ICD-10-CM

## 2020-02-22 DIAGNOSIS — R2681 Unsteadiness on feet: Secondary | ICD-10-CM | POA: Diagnosis not present

## 2020-02-22 DIAGNOSIS — Z7951 Long term (current) use of inhaled steroids: Secondary | ICD-10-CM | POA: Diagnosis not present

## 2020-02-22 DIAGNOSIS — I959 Hypotension, unspecified: Secondary | ICD-10-CM | POA: Diagnosis not present

## 2020-02-22 DIAGNOSIS — R195 Other fecal abnormalities: Secondary | ICD-10-CM

## 2020-02-22 DIAGNOSIS — D62 Acute posthemorrhagic anemia: Secondary | ICD-10-CM | POA: Diagnosis not present

## 2020-02-22 DIAGNOSIS — E860 Dehydration: Secondary | ICD-10-CM | POA: Diagnosis present

## 2020-02-22 DIAGNOSIS — R410 Disorientation, unspecified: Secondary | ICD-10-CM | POA: Diagnosis not present

## 2020-02-22 DIAGNOSIS — Z7902 Long term (current) use of antithrombotics/antiplatelets: Secondary | ICD-10-CM | POA: Diagnosis not present

## 2020-02-22 DIAGNOSIS — R278 Other lack of coordination: Secondary | ICD-10-CM | POA: Diagnosis not present

## 2020-02-22 DIAGNOSIS — R0681 Apnea, not elsewhere classified: Secondary | ICD-10-CM | POA: Diagnosis not present

## 2020-02-22 DIAGNOSIS — J45909 Unspecified asthma, uncomplicated: Secondary | ICD-10-CM | POA: Diagnosis present

## 2020-02-22 DIAGNOSIS — I509 Heart failure, unspecified: Secondary | ICD-10-CM

## 2020-02-22 DIAGNOSIS — I10 Essential (primary) hypertension: Secondary | ICD-10-CM | POA: Diagnosis present

## 2020-02-22 DIAGNOSIS — Z7189 Other specified counseling: Secondary | ICD-10-CM

## 2020-02-22 DIAGNOSIS — I350 Nonrheumatic aortic (valve) stenosis: Secondary | ICD-10-CM | POA: Diagnosis not present

## 2020-02-22 DIAGNOSIS — E119 Type 2 diabetes mellitus without complications: Secondary | ICD-10-CM

## 2020-02-22 DIAGNOSIS — C49A2 Gastrointestinal stromal tumor of stomach: Principal | ICD-10-CM | POA: Diagnosis present

## 2020-02-22 DIAGNOSIS — IMO0002 Reserved for concepts with insufficient information to code with codable children: Secondary | ICD-10-CM | POA: Diagnosis present

## 2020-02-22 DIAGNOSIS — M6281 Muscle weakness (generalized): Secondary | ICD-10-CM | POA: Diagnosis not present

## 2020-02-22 DIAGNOSIS — K3189 Other diseases of stomach and duodenum: Secondary | ICD-10-CM | POA: Diagnosis not present

## 2020-02-22 DIAGNOSIS — K625 Hemorrhage of anus and rectum: Secondary | ICD-10-CM | POA: Diagnosis not present

## 2020-02-22 DIAGNOSIS — R4020 Unspecified coma: Secondary | ICD-10-CM | POA: Diagnosis not present

## 2020-02-22 DIAGNOSIS — K921 Melena: Secondary | ICD-10-CM | POA: Diagnosis not present

## 2020-02-22 DIAGNOSIS — D649 Anemia, unspecified: Secondary | ICD-10-CM | POA: Diagnosis not present

## 2020-02-22 DIAGNOSIS — I25119 Atherosclerotic heart disease of native coronary artery with unspecified angina pectoris: Secondary | ICD-10-CM | POA: Diagnosis not present

## 2020-02-22 DIAGNOSIS — Z6831 Body mass index (BMI) 31.0-31.9, adult: Secondary | ICD-10-CM

## 2020-02-22 DIAGNOSIS — I4891 Unspecified atrial fibrillation: Secondary | ICD-10-CM | POA: Diagnosis present

## 2020-02-22 DIAGNOSIS — K317 Polyp of stomach and duodenum: Secondary | ICD-10-CM | POA: Diagnosis present

## 2020-02-22 DIAGNOSIS — R4182 Altered mental status, unspecified: Secondary | ICD-10-CM | POA: Diagnosis not present

## 2020-02-22 DIAGNOSIS — G301 Alzheimer's disease with late onset: Secondary | ICD-10-CM | POA: Diagnosis not present

## 2020-02-22 DIAGNOSIS — K250 Acute gastric ulcer with hemorrhage: Secondary | ICD-10-CM | POA: Diagnosis not present

## 2020-02-22 LAB — HEMOGLOBIN AND HEMATOCRIT, BLOOD
HCT: 25.2 % — ABNORMAL LOW (ref 36.0–46.0)
Hemoglobin: 8.7 g/dL — ABNORMAL LOW (ref 12.0–15.0)

## 2020-02-22 LAB — HEMOGLOBIN A1C
Hgb A1c MFr Bld: 6.1 % — ABNORMAL HIGH (ref 4.8–5.6)
Mean Plasma Glucose: 128.37 mg/dL

## 2020-02-22 MED ORDER — ALPRAZOLAM 0.25 MG PO TABS: 0.2500 mg | ORAL_TABLET | Freq: Every day | ORAL | Status: DC

## 2020-02-22 MED ORDER — INSULIN ASPART 100 UNIT/ML ~~LOC~~ SOLN
0.0000 [IU] | Freq: Three times a day (TID) | SUBCUTANEOUS | Status: DC
  Administered 2020-02-23 – 2020-02-24 (×2): 1 [IU] via SUBCUTANEOUS
  Administered 2020-02-24: 2 [IU] via SUBCUTANEOUS
  Administered 2020-02-25 – 2020-02-26 (×6): 1 [IU] via SUBCUTANEOUS
  Administered 2020-02-27 – 2020-02-28 (×3): 2 [IU] via SUBCUTANEOUS
  Administered 2020-02-28: 3 [IU] via SUBCUTANEOUS
  Administered 2020-02-29: 1 [IU] via SUBCUTANEOUS
  Administered 2020-02-29 (×2): 2 [IU] via SUBCUTANEOUS
  Administered 2020-03-01 – 2020-03-02 (×4): 1 [IU] via SUBCUTANEOUS

## 2020-02-22 MED ORDER — SODIUM CHLORIDE 0.9 % IV SOLN
1.0000 g | INTRAVENOUS | Status: AC
  Administered 2020-02-23 – 2020-02-24 (×2): 1 g via INTRAVENOUS
  Filled 2020-02-22 (×2): qty 1

## 2020-02-22 MED ORDER — ALPRAZOLAM 0.5 MG PO TABS
0.5000 mg | ORAL_TABLET | Freq: Every day | ORAL | Status: DC
  Administered 2020-02-22 – 2020-03-01 (×9): 0.5 mg via ORAL
  Filled 2020-02-22 (×9): qty 1

## 2020-02-22 MED ORDER — ALPRAZOLAM 0.5 MG PO TABS: 0.5000 mg | ORAL_TABLET | Freq: Every day | ORAL | Status: DC

## 2020-02-22 MED ORDER — ATORVASTATIN CALCIUM 80 MG PO TABS
80.0000 mg | ORAL_TABLET | Freq: Every day | ORAL | Status: DC
  Administered 2020-02-24 – 2020-03-02 (×8): 80 mg via ORAL
  Filled 2020-02-22 (×8): qty 1

## 2020-02-22 MED ORDER — FLUTICASONE FUROATE-VILANTEROL 100-25 MCG/INH IN AEPB
1.0000 | INHALATION_SPRAY | Freq: Every day | RESPIRATORY_TRACT | Status: DC
  Administered 2020-02-23 – 2020-03-02 (×6): 1 via RESPIRATORY_TRACT
  Filled 2020-02-22: qty 28

## 2020-02-22 MED ORDER — PANTOPRAZOLE SODIUM 40 MG IV SOLR
40.0000 mg | Freq: Two times a day (BID) | INTRAVENOUS | Status: DC
  Administered 2020-02-23 – 2020-02-24 (×3): 40 mg via INTRAVENOUS
  Filled 2020-02-22 (×3): qty 40

## 2020-02-22 MED ORDER — ACETAMINOPHEN 500 MG PO TABS
500.0000 mg | ORAL_TABLET | Freq: Two times a day (BID) | ORAL | Status: DC
  Administered 2020-02-22 – 2020-03-02 (×18): 500 mg via ORAL
  Filled 2020-02-22 (×18): qty 1

## 2020-02-22 MED ORDER — DONEPEZIL HCL 10 MG PO TABS
10.0000 mg | ORAL_TABLET | Freq: Every day | ORAL | Status: DC
  Administered 2020-02-22 – 2020-03-01 (×9): 10 mg via ORAL
  Filled 2020-02-22 (×9): qty 1

## 2020-02-22 MED ORDER — ALPRAZOLAM 0.25 MG PO TABS
0.2500 mg | ORAL_TABLET | Freq: Every day | ORAL | Status: DC
  Administered 2020-02-23 – 2020-03-02 (×9): 0.25 mg via ORAL
  Filled 2020-02-22 (×9): qty 1

## 2020-02-22 MED ORDER — ALBUTEROL SULFATE (2.5 MG/3ML) 0.083% IN NEBU: 2.5000 mg | INHALATION_SOLUTION | Freq: Four times a day (QID) | RESPIRATORY_TRACT | Status: DC | PRN

## 2020-02-22 MED ORDER — LIP MEDEX EX OINT
TOPICAL_OINTMENT | CUTANEOUS | Status: DC | PRN
  Filled 2020-02-22: qty 7

## 2020-02-22 MED ORDER — MEMANTINE HCL ER 28 MG PO CP24
28.0000 mg | ORAL_CAPSULE | Freq: Every day | ORAL | Status: DC
  Administered 2020-02-23 – 2020-03-02 (×9): 28 mg via ORAL
  Filled 2020-02-22 (×9): qty 1

## 2020-02-22 NOTE — Progress Notes (Signed)
Blood finished at [place] that was started in [place] prior to being transferred to [facility]. Vitals were obtained and WNL, refer to flowsheets. Pt is currently resting in bed. Will continue to monitor.   Eleanora Neighbor, RN

## 2020-02-22 NOTE — Progress Notes (Signed)
Received a call that Dr. Marice Potter will was paged about five minutes ago to come and see pt and put in orders for a diet. RN will let family know.   Eleanora Neighbor, RN

## 2020-02-22 NOTE — Progress Notes (Signed)
NEW ADMISSION NOTE New Admission Note:   Arrival Method: EMS from [place], [place] Mental Orientation: alert and oriented x4  Telemetry: waiting on orders  Assessment: Completed Skin: bruising on arms and legs  IV: LAC, RFA Pain: 0 Tubes: foley  Safety Measures: Safety Fall Prevention Plan has been given, discussed and signed Admission: Completed [facility] [place] Orientation: Patient has been orientated to the room, unit and staff.  Family: NA  Orders have been reviewed and implemented. Will continue to monitor the patient. Call light has been placed within reach and bed alarm has been activated.   Baldo Ash, RN

## 2020-02-22 NOTE — Plan of Care (Signed)
  Problem: Health Behavior/Discharge Planning: Goal: Ability to manage health-related needs will improve Outcome: Progressing   

## 2020-02-22 NOTE — Progress Notes (Signed)
Alert is popping up in chart that pt has a foley in but order is not in chart. Pt had a foley catheter placed in [place] prior to coming to [facility]. RN messaged NP on call for an order. Awaiting response.   Eleanora Neighbor, RN

## 2020-02-22 NOTE — Progress Notes (Signed)
NP stated that since pt does not have an H&P in chart that this RN would need to messaged MD directly regarding NPO and meds. RN messaged Dr. Marice Potter on call. Awaiting response.   Eleanora Neighbor, RN

## 2020-02-22 NOTE — Progress Notes (Signed)
Pt has meds ordered, but is NPO. RN messaged NP on call to clarify if pt needs to be sips with meds or strict NPO. Awaiting response.    Eleanora Neighbor, RN

## 2020-02-22 NOTE — H&P (Signed)
[facility] History and Physical  Katherine [name] XKG:[id number] DOB: [birth year] DOA: 02/22/2020  Referring ED: [facility] in [place], [place] PCP: Celene Squibb, MD   Chief Complaint: Weakness, Altered mental status   HPI: Katherine [name] is a 86 y.o. female with PMH CAD s/p CABG x 4, morbid obesity, HTN, HLD, DM II, and h/o severe AS s/p TAVRwho transferred to [facility] after presenting to outside hospital for weakness and found to have Hgb 6.2 presumed secondary to GI bleed.   Patient with advanced dementia and therefore all history obtained from daughter [name].  Daughter reports that this morning this patient seemed less alert and more confused than normal and also more weak. She can walk with a walker at baseline and went into the bathroom this morning and was then unable to get up. [name] reports frequent UTI's and this is what she assumed was the problem again, she took her to [facility] in [place], [place] where she was found to have Hgb 6.2 and positive hemoccult POC. Patient was given 2 units of blood and transferred to [facility]. [name] reports that she had noted darker stools over past 2 days but reports that this sometimes happens and she did not think anything of it. She has not noted any other concerns. At baseline she can have limited conversations. She lives with [name] and they have aides come during the day to help care for her. She now notes that the patient does appear pale. She has been eating normally. Denies fever, cough, SOB, vomiting, diarrhea, constipation, hematuria, difficulty moving arms/legs, speech difficulty.  At outside ER: Labs remarkable for Hgb 6.2 otherwise CBC WNL. POC Hemoccult positive. Drug screen, Tylenol level, Aspirin level, ammonia level unremarkable. Cr 1.44 (unknown baseline), Na 146, K 4.1, Cl 115, Glucose 149, LFT's WNL. CPK 658, Lactate 1.2, PT 13.3, INR 1.2, Trop 0.027. UA with large leuks, neg nitrite and many WBC's. COVID negative. CT Head and CXR  without acute findings. Patient given 1L NS, Protonix 80 mg and Rocephin 1 g.  EKG: HR 80. Sinus rhythm. QTc 395. No STEMI. LVH.  Vitals on arrival: Mild range BP with stable heart rate, temp and RR. 100% O2 sat.   Review of Systems:  Unable to obtain ROS from patient due to advanced dementia. ROS obtained as above from daughter, [name].   Past Medical History:  Diagnosis Date  . Acute bronchitis 12/16/2015  . Acute exacerbation of CHF (congestive heart failure) ([place]) 11/20/2016  . Acute metabolic encephalopathy 06/05/6377  . Anemia   . Anxiety   . Aortic valvular stenosis    moderate to severe  EF on 2D Echo 10/22/2012 65-70%  valve area of 1.2cm, and peak and mean gradients of 45 and 47mmHg  . Bronchospasm 12/16/2015  . Chalazion of right upper eyelid early June 2016   treating with cream, Rx by Dr. Merlyn Albert    . CHF (congestive heart failure) ([place])   . Congestive heart failure ([place]) 03/04/2010   Qualifier: Diagnosis of  By: Tamala Julian CMA, Claiborne Billings    . Coronary artery disease   . Dementia ([place])   . Diabetes mellitus without complication ([place]) 5/88/5027  . Diabetes mellitus, type 2 ([employer])   . Dyspnea    chronic  . Easy fatigability   . Gastric mass   . Gastric ulcer   . GERD (gastroesophageal reflux disease)   . Heart murmur   . Hernia of unspecified site of abdominal cavity without mention of obstruction or gangrene  hiatal  . Hyperlipidemia   . Hypertension   . Hypokalemia 11/23/2016  . Normal cardiac stress test 12/2013   normal stress nuclear study  . Osteoarthritis   . Pain due to onychomycosis of toenails of both feet 06/04/2019  . Thyroid mass    Past Surgical History:  Procedure Laterality Date  . AORTIC VALVE SURGERY    . CATARACT EXTRACTION Bilateral   . CORONARY ARTERY BYPASS GRAFT     x4 in 2003  . internal hemorrhoidectomy    . KNEE SURGERY Right   . LEFT AND RIGHT HEART CATHETERIZATION WITH CORONARY/GRAFT ANGIOGRAM N/A 03/24/2015   Procedure: LEFT AND RIGHT HEART  CATHETERIZATION WITH Beatrix Fetters;  Surgeon: Burnell Blanks, MD;  Location[facility] CATH LAB;  Service: Cardiovascular;  Laterality: N/A;  . TEE WITHOUT CARDIOVERSION N/A 05/26/2015   Procedure: TRANSESOPHAGEAL ECHOCARDIOGRAM (TEE);  Surgeon: Burnell Blanks, MD;  Location: [facility];  Service: Open Heart Surgery;  Laterality: N/A;  . TRANSCATHETER AORTIC VALVE REPLACEMENT, TRANSFEMORAL N/A 05/26/2015   Procedure: TRANSCATHETER AORTIC VALVE REPLACEMENT, TRANSFEMORAL;  Surgeon: Burnell Blanks, MD;  Location: [place];  Service: Open Heart Surgery;  Laterality: N/A;  . TUBAL LIGATION    . uterine polypectomy     Social History:  reports that she has never smoked. She has never used smokeless tobacco. She reports that she does not drink alcohol or use drugs.  Allergies  Allergen Reactions  . Tape Other (See Comments)    SKIN IS VERY THIN AND WILL TEAR EASILY!!    Family History  Problem Relation Age of Onset  . Diabetes Mother   . Heart disease Mother   . Stroke Mother   . Cerebral aneurysm Mother   . Stroke Father   . Heart attack Brother   . Colon cancer Neg Hx     Prior to Admission medications   Medication Sig Start Date End Date Taking? Authorizing Provider  acetaminophen (TYLENOL) 500 MG tablet Take 1,000 mg by mouth 2 (two) times daily. for pain. Do not exceed 4gm of Tylenol in 24 hours   Yes [provider]  albuterol (VENTOLIN HFA) 108 (90 Base) MCG/ACT inhaler Inhale 1 puff into the lungs every 6 (six) hours as needed for wheezing or shortness of breath.   Yes [provider]  ALPRAZolam (XANAX) 0.5 MG tablet Take 0.125-0.5 mg by mouth See admin instructions. Take 1/4 tablet (0.125 mg) by mouth every morning and 1 tablet (0.5 mg) at night, may also take 1/4 tablet (0.125 mg) with lunch as needed for anxiety. 02/11/20  Yes [provider]  atorvastatin (LIPITOR) 80 MG tablet Take 80 mg by mouth daily with lunch.    Yes [provider]  budesonide-formoterol (SYMBICORT) 80-4.5 MCG/ACT inhaler Inhale 2 puffs into the lungs 2 (two) times daily as needed (shortness of breath/wheezing).   Yes [provider]  cetirizine (ZYRTEC) 10 MG tablet Take 10 mg by mouth daily as needed for allergies or rhinitis.   Yes [provider]  clopidogrel (PLAVIX) 75 MG tablet Take 1 tablet (75 mg total) by mouth daily with breakfast. 06/03/15  Yes Barrett, Rhonda G, PA-C  donepezil (ARICEPT) 10 MG tablet Take 10 mg by mouth at bedtime. 02/07/20  Yes [provider]  esomeprazole (NEXIUM) 20 MG capsule Take 20 mg by mouth daily.    Yes [provider]  furosemide (LASIX) 20 MG tablet TAKE 2 TABLETS BY MOUTH EVERY DAY Patient taking differently: Take 20 mg  by mouth 2 (two) times daily with breakfast and lunch.  02/20/20  Yes Hilty, Nadean Corwin, MD  Homeopathic Products ([place]) Apply 1 application topically 2 (two) times daily as needed (pain).   Yes [provider]  hydrALAZINE (APRESOLINE) 50 MG tablet Take 1 tablet (50 mg total) by mouth every 8 (eight) hours. Patient taking differently: Take 50 mg by mouth every 8 (eight) hours. For blood pressure 06/03/15  Yes Barrett, Rhonda G, PA-C  insulin detemir (LEVEMIR) 100 UNIT/ML injection Inject 0.1 mLs (10 Units total) into the skin at bedtime. Patient taking differently: Inject 12 Units into the skin at bedtime.  06/28/18  Yes Isaac Bliss, Rayford Halsted, MD  ipratropium (ATROVENT) 0.03 % nasal spray Place 1 spray into both nostrils daily as needed for rhinitis (sneezing).  11/04/19  Yes [provider]  Linaclotide (LINZESS) 145 MCG CAPS capsule Take 145 mcg by mouth daily as needed (for constipation).   Yes [provider]  lisinopril (PRINIVIL,ZESTRIL) 10 MG tablet Take 1 tablet (10 mg total) by mouth daily. 06/03/15  Yes Barrett, Evelene Croon, PA-C  loperamide (IMODIUM) 2 MG capsule Take 2 mg by mouth 2 (two) times daily as  needed for diarrhea or loose stools.    Yes [provider]  Memantine HCl ER (NAMENDA XR) 28 MG CP24 Take 28 mg by mouth daily. for dementia   Yes [provider]  metFORMIN (GLUCOPHAGE-XR) 500 MG 24 hr tablet Take 500 mg by mouth 2 (two) times daily with breakfast and lunch. To control blood sugar. Do not crush. 03/16/15  Yes [provider]  Multiple Vitamin (MULTIVITAMIN WITH MINERALS) TABS tablet Take 1 tablet by mouth daily.   Yes [provider]  Naphazoline HCl (CLEAR EYES OP) Place 1 drop into both eyes daily as needed (dry eyes).   Yes [provider]  nebivolol (BYSTOLIC) 10 MG tablet Take 10 mg by mouth daily. for blood pressure   Yes [provider]  NYSTATIN powder Apply 1 application topically 2 (two) times daily as needed for for breast care. 11/15/16  Yes [provider]  potassium chloride SA (K-DUR,KLOR-CON) 20 MEQ tablet Take 20 mEq by mouth 2 (two) times daily with breakfast and lunch.    Yes [provider]   Physical Exam: Vitals:   02/22/20 1800 02/22/20 2000  BP: (!) 157/60 (!) 136/49  Pulse: 77 73  Resp: 16 19  Temp: 98.1 F (36.7 C) 98.5 F (36.9 C)  TempSrc: Oral Oral  SpO2: 100% 100%    Wt Readings from Last 3 Encounters:  02/13/20 85.3 kg  06/26/18 93.9 kg  03/19/18 93.9 kg    General:  Appears calm and comfortable. Oriented to person only. Appears pale.  Eyes: EOMI, normal lids, irises & conjunctiva ENT: grossly normal hearing Neck: normal ROM Cardiovascular: RRR. Trace non-pitting edema of LE. Respiratory: CTA bilaterally, no w/r/r. Normal respiratory effort. Abdomen: soft, ntnd Skin: no rash or induration seen on limited exam Musculoskeletal: grossly normal tone BUE/BLE Psychiatric: grossly normal mood and affect, speech fluent Neurologic: grossly non-focal.          Labs on Admission:  Basic Metabolic Panel: No results for input(s): NA, K, CL, CO2, GLUCOSE, BUN,  CREATININE, CALCIUM, MG, PHOS in the last 168 hours. Liver Function Tests: No results for input(s): AST, ALT, ALKPHOS, BILITOT, PROT, ALBUMIN in the last 168 hours. No results for input(s): LIPASE, AMYLASE in the last 168 hours. No results for input(s): AMMONIA in  the last 168 hours. CBC: No results for input(s): WBC, NEUTROABS, HGB, HCT, MCV, PLT in the last 168 hours. Cardiac Enzymes: No results for input(s): CKTOTAL, CKMB, CKMBINDEX, TROPONINI in the last 168 hours.  BNP (last 3 results) No results for input(s): BNP in the last 8760 hours.  ProBNP (last 3 results) No results for input(s): PROBNP in the last 8760 hours.  CBG: No results for input(s): GLUCAP in the last 168 hours.  Radiological Exams on Admission: No results found.  EKG: Independently reviewed. HR 80. Sinus rhythm. QTc 395. No STEMI. LVH.   Assessment/Plan Principal Problem:   GI bleed Active Problems:   Diabetes mellitus type 2, uncontrolled (HCC)   Hyperlipidemia   Anxiety state   Essential hypertension   Coronary atherosclerosis   S/P CABG x 4   Morbid obesity (HCC)   S/p TAVR (transcatheter aortic valve replacement), bioprosthetic   Chronic diastolic heart failure (HCC)   Urinary tract infection   Chronic diastolic CHF (congestive heart failure) ([place])   Advanced dementia ([place])  86 y.o. female with PMH CAD s/p CABG x 4, morbid obesity, HTN, HLD, DM II, and h/o severe AS s/p TAVRwho transferred to [facility] after presenting to outside hospital for weakness and found to have Hgb 6.2 presumed secondary to GI bleed.  GI Bleed - report of melena x2 days with Hgb 6.2 at outside facility - s/p 2 units PRBCs - Repeat CBC pending then will trend q6h - Ensure 2 IV's in place - S/p 80 mg IV Protonix at outside facility; cont 40 mg PPI BID - Hold PTA Plavix and anti-hypertensive meds at this time - GI consulted and discussed with Dr. Hilarie Fredrickson and they will see patient in AM - Keep NPO - Transfuse to keep Hgb > 8.0  ideally due to CAD hx - hx of PUD per problem list - daughter denies previous GI bleed   Acute Cystitis - UA with large leuks and many WBC's at outside ER - s/p Rocephin 1 g; will cont for 2 more doses - Will collect urine cx here although may not be helpful as patient has already received abx  HTN - Hold PTA Lisinopril, Nebivolol, Lasix and Hydralazine due to risk for acute blood loss and hypotension - restart PRN  DM - A1c pending - Hold PTA Metformin and Levemir as patient NPO - Sliding scale insulin  HLD - cont Lipitor  Anxiety/Dementia - cont PTA Memantine, Aricept, Xanax  Arthritis - cont PTA Tylenol per daughter's request; ordered at lower dose as patient not as mobile at this time  Asthma - cont PTA inhalers   CHF/CAD - hold plavix - patient s/p CABG and TAVR - Last Echo Nov 2019 with EF 07-62%, grade I diastolic dysfunction - Last saw Cards 02/13/2020  Code Status: Full Code; discussed with [name] on admission DVT Prophylaxis: None due to active bleed  Family Communication: [name] and another of the patient's daughters present in room; [name] provided most of history Disposition Plan: Admit to Inpatient. Patient is at high risk for further decompensation due to age and co-morbidities. GI consulted.   Time spent: 70 minutes  Chauncey Mann, MD [facility] Pager [phone]

## 2020-02-23 DIAGNOSIS — E119 Type 2 diabetes mellitus without complications: Secondary | ICD-10-CM

## 2020-02-23 DIAGNOSIS — R195 Other fecal abnormalities: Secondary | ICD-10-CM

## 2020-02-23 DIAGNOSIS — Z953 Presence of xenogenic heart valve: Secondary | ICD-10-CM

## 2020-02-23 DIAGNOSIS — Z794 Long term (current) use of insulin: Secondary | ICD-10-CM

## 2020-02-23 DIAGNOSIS — D649 Anemia, unspecified: Secondary | ICD-10-CM

## 2020-02-23 DIAGNOSIS — Z951 Presence of aortocoronary bypass graft: Secondary | ICD-10-CM

## 2020-02-23 DIAGNOSIS — N179 Acute kidney failure, unspecified: Secondary | ICD-10-CM

## 2020-02-23 LAB — CBC
HCT: 22.5 % — ABNORMAL LOW (ref 36.0–46.0)
HCT: 22.6 % — ABNORMAL LOW (ref 36.0–46.0)
HCT: 21.8 % — ABNORMAL LOW (ref 36.0–46.0)
Hemoglobin: 7.8 g/dL — ABNORMAL LOW (ref 12.0–15.0)
Hemoglobin: 7.6 g/dL — ABNORMAL LOW (ref 12.0–15.0)
Hemoglobin: 7.3 g/dL — ABNORMAL LOW (ref 12.0–15.0)
MCH: 29.9 pg (ref 26.0–34.0)
MCH: 29.7 pg (ref 26.0–34.0)
MCH: 29.6 pg (ref 26.0–34.0)
MCHC: 34.7 g/dL (ref 30.0–36.0)
MCHC: 33.6 g/dL (ref 30.0–36.0)
MCHC: 33.5 g/dL (ref 30.0–36.0)
MCV: 86.2 fL (ref 80.0–100.0)
MCV: 88.3 fL (ref 80.0–100.0)
MCV: 88.3 fL (ref 80.0–100.0)
Platelets: 167 10*3/uL (ref 150–400)
Platelets: 178 10*3/uL (ref 150–400)
Platelets: 166 10*3/uL (ref 150–400)
RBC: 2.61 MIL/uL — ABNORMAL LOW (ref 3.87–5.11)
RBC: 2.56 MIL/uL — ABNORMAL LOW (ref 3.87–5.11)
RBC: 2.47 MIL/uL — ABNORMAL LOW (ref 3.87–5.11)
RDW: 14.6 % (ref 11.5–15.5)
RDW: 15 % (ref 11.5–15.5)
RDW: 15.2 % (ref 11.5–15.5)
WBC: 9.6 10*3/uL (ref 4.0–10.5)
WBC: 8 10*3/uL (ref 4.0–10.5)
WBC: 7.9 10*3/uL (ref 4.0–10.5)
nRBC: 0 % (ref 0.0–0.2)
nRBC: 0 % (ref 0.0–0.2)
nRBC: 0 % (ref 0.0–0.2)

## 2020-02-23 LAB — COMPREHENSIVE METABOLIC PANEL
ALT: 17 U/L (ref 0–44)
AST: 22 U/L (ref 15–41)
Albumin: 2.7 g/dL — ABNORMAL LOW (ref 3.5–5.0)
Alkaline Phosphatase: 46 U/L (ref 38–126)
Anion gap: 7 (ref 5–15)
BUN: 50 mg/dL — ABNORMAL HIGH (ref 8–23)
CO2: 19 mmol/L — ABNORMAL LOW (ref 22–32)
Calcium: 9.4 mg/dL (ref 8.9–10.3)
Chloride: 121 mmol/L — ABNORMAL HIGH (ref 98–111)
Creatinine, Ser: 1.33 mg/dL — ABNORMAL HIGH (ref 0.44–1.00)
GFR calc Af Amer: 41 mL/min — ABNORMAL LOW (ref >60)
GFR calc non Af Amer: 35 mL/min — ABNORMAL LOW (ref >60)
Glucose, Bld: 133 mg/dL — ABNORMAL HIGH (ref 70–99)
Potassium: 3.9 mmol/L (ref 3.5–5.1)
Sodium: 147 mmol/L — ABNORMAL HIGH (ref 135–145)
Total Bilirubin: 0.7 mg/dL (ref 0.3–1.2)
Total Protein: 5 g/dL — ABNORMAL LOW (ref 6.5–8.1)

## 2020-02-23 LAB — PREPARE RBC (CROSSMATCH)

## 2020-02-23 LAB — GLUCOSE, CAPILLARY
Glucose-Capillary: 124 mg/dL — ABNORMAL HIGH (ref 70–99)
Glucose-Capillary: 130 mg/dL — ABNORMAL HIGH (ref 70–99)
Glucose-Capillary: 131 mg/dL — ABNORMAL HIGH (ref 70–99)
Glucose-Capillary: 154 mg/dL — ABNORMAL HIGH (ref 70–99)

## 2020-02-23 LAB — IRON AND TIBC
Iron: 38 ug/dL (ref 28–170)
Saturation Ratios: 17 % (ref 10.4–31.8)
TIBC: 220 ug/dL — ABNORMAL LOW (ref 250–450)
UIBC: 182 ug/dL

## 2020-02-23 LAB — FERRITIN: Ferritin: 24 ng/mL (ref 11–307)

## 2020-02-23 LAB — MAGNESIUM: Magnesium: 1.7 mg/dL (ref 1.7–2.4)

## 2020-02-23 MED ORDER — SODIUM CHLORIDE 0.9% IV SOLUTION: Freq: Once | INTRAVENOUS | Status: DC

## 2020-02-23 MED ORDER — CHLORHEXIDINE GLUCONATE CLOTH 2 % EX PADS
6.0000 | MEDICATED_PAD | Freq: Every day | CUTANEOUS | Status: DC
  Administered 2020-02-23 – 2020-03-02 (×9): 6 via TOPICAL

## 2020-02-23 MED ORDER — LACTATED RINGERS IV SOLN: INTRAVENOUS | Status: DC

## 2020-02-23 NOTE — Plan of Care (Signed)
  Problem: Clinical Measurements: Goal: Ability to maintain clinical measurements within normal limits will improve 02/23/2020 1015 by Baldo Ash, RN Outcome: Progressing 02/23/2020 0854 by Baldo Ash, RN Outcome: Progressing

## 2020-02-23 NOTE — Consult Note (Signed)
Consultation  Referring Provider: Dr.Xu    Primary Care Physician:  Celene Squibb, MD Primary Gastroenterologist: Dr. Henrene Pastor         Reason for Consultation: GI bleed, acute blood loss anemia             HPI:   Katherine [name] is a 86 y.o. female with a past medical history of CAD status post CABG x4, morbid obesity, diabetes type 2 and history of severe aortic stenosis status post TAVR who transferred to [facility] after presenting to outside hospital for weakness and altered mental status on 02/22/2020.  We are consulted after the patient was found to have a hemoglobin of 6.2 presumed secondary to GI bleed with melenic stools.    Patient has advanced dementia and history is garnered from previous physicians notes in the chart as well as daughter who is in the room.  Apparently yesterday the daughter reported that the patient seemed less alert in the morning with confusion and weakness.  At baseline patient can walk with a walker and went into the bathroom yesterday morning and was unable to get up.  Patient is known to have frequent UTIs and this was presumed to be the problem again, she was taken to [place] in [place] where she was found to have a hemoglobin of 6.2 and positive Hemoccult.  She was given 2 units of blood and transferred to [facility].  Her daughter reports noticing some darker stools over the past 2 days, but "sometimes this happens".  Per her knowledge patient has not any further bowel movement since admission.  She complains of abdominal pain off and on but "she has dementia and complains of pain occasionally".  Denies use of NSAIDs.     At baseline patient can have limited conversations.  She lives with her daughter and they have some home health aides.    Daughter denied any fever, cough, shortness of breath, vomiting, diarrhea, constipation, hematuria, difficulty moving arms/legs or speech difficulty.     ER course: Hemoglobin 6.2, otherwise normal CBC,  Hemoccult positive, creatinine 1.4 (unknown baseline), sodium 146, potassium 4.1, UA with large leukocytes and negative nitrite and many WBCs, Covid negative, CT head and chest x-ray without acute findings, patient given Protonix 80 mg and Rocephin 1 g  Previous GI history: 08/23/2011 EGD with mass, hiatal hernia and otherwise normal (no change in large submucosal mass gastric cardia consistent with leiomyoma which is thought source of bleeding 03/11/2010 colonoscopy done for history of colon polyps, normal other than internal hemorrhoids-diagnosed with IBS at that time  Past Medical History:  Diagnosis Date   Acute bronchitis 12/16/2015   Acute exacerbation of CHF (congestive heart failure) ([place]) 98/33/8250   Acute metabolic encephalopathy 5/39/7673   Anemia    Anxiety    Aortic valvular stenosis    moderate to severe  EF on 2D Echo 10/22/2012 65-70%  valve area of 1.2cm, and peak and mean gradients of 45 and 73mmHg   Bronchospasm 12/16/2015   Chalazion of right upper eyelid early June 2016   treating with cream, Rx by Dr. Merlyn Albert     CHF (congestive heart failure) ([place])    Congestive heart failure ([place]) 03/04/2010   Qualifier: Diagnosis of  By: Tamala Julian CMA, Kelly     Coronary artery disease    Dementia ([place])    Diabetes mellitus without complication ([place]) 03/23/3789   Diabetes mellitus, type 2 (HCC)    Dyspnea  chronic   Easy fatigability    Gastric mass    Gastric ulcer    GERD (gastroesophageal reflux disease)    Heart murmur    Hernia of unspecified site of abdominal cavity without mention of obstruction or gangrene    hiatal   Hyperlipidemia    Hypertension    Hypokalemia 11/23/2016   Normal cardiac stress test 12/2013   normal stress nuclear study   Osteoarthritis    Pain due to onychomycosis of toenails of both feet 06/04/2019   Thyroid mass     Past Surgical History:  Procedure Laterality Date   AORTIC VALVE SURGERY     CATARACT EXTRACTION  Bilateral    CORONARY ARTERY BYPASS GRAFT     x4 in 2003   internal hemorrhoidectomy     KNEE SURGERY Right    LEFT AND RIGHT HEART CATHETERIZATION WITH CORONARY/GRAFT ANGIOGRAM N/A 03/24/2015   Procedure: LEFT AND RIGHT HEART CATHETERIZATION WITH Beatrix Fetters;  Surgeon: Burnell Blanks, MD;  Location: [facility] CATH LAB;  Service: Cardiovascular;  Laterality: N/A;   TEE WITHOUT CARDIOVERSION N/A 05/26/2015   Procedure: TRANSESOPHAGEAL ECHOCARDIOGRAM (TEE);  Surgeon: Burnell Blanks, MD;  Location: [facility];  Service: Open Heart Surgery;  Laterality: N/A;   TRANSCATHETER AORTIC VALVE REPLACEMENT, TRANSFEMORAL N/A 05/26/2015   Procedure: TRANSCATHETER AORTIC VALVE REPLACEMENT, TRANSFEMORAL;  Surgeon: Burnell Blanks, MD;  Location: [facility];  Service: Open Heart Surgery;  Laterality: N/A;   TUBAL LIGATION     uterine polypectomy      Family History  Problem Relation Age of Onset   Diabetes Mother    Heart disease Mother    Stroke Mother    Cerebral aneurysm Mother    Stroke Father    Heart attack Brother    Colon cancer Neg Hx     Social History   Tobacco Use   Smoking status: Never Smoker   Smokeless tobacco: Never Used  Substance Use Topics   Alcohol use: No   Drug use: No    Prior to Admission medications   Medication Sig Start Date End Date Taking? Authorizing Provider  acetaminophen (TYLENOL) 500 MG tablet Take 1,000 mg by mouth 2 (two) times daily. for pain. Do not exceed 4gm of Tylenol in 24 hours   Yes [provider]  albuterol (VENTOLIN HFA) 108 (90 Base) MCG/ACT inhaler Inhale 1 puff into the lungs every 6 (six) hours as needed for wheezing or shortness of breath.   Yes [provider]  ALPRAZolam (XANAX) 0.5 MG tablet Take 0.125-0.5 mg by mouth See admin instructions. Take 1/4 tablet (0.125 mg) by mouth every morning and 1 tablet (0.5 mg) at night, may also take 1/4 tablet (0.125 mg) with lunch as needed for  anxiety. 02/11/20  Yes [provider]  atorvastatin (LIPITOR) 80 MG tablet Take 80 mg by mouth daily with lunch.    Yes [provider]  budesonide-formoterol (SYMBICORT) 80-4.5 MCG/ACT inhaler Inhale 2 puffs into the lungs 2 (two) times daily as needed (shortness of breath/wheezing).   Yes [provider]  cetirizine (ZYRTEC) 10 MG tablet Take 10 mg by mouth daily as needed for allergies or rhinitis.   Yes [provider]  clopidogrel (PLAVIX) 75 MG tablet Take 1 tablet (75 mg total) by mouth daily with breakfast. 06/03/15  Yes Barrett, Rhonda G, PA-C  donepezil (ARICEPT) 10 MG tablet Take 10 mg by mouth at bedtime. 02/07/20  Yes [provider]  esomeprazole (NEXIUM) 20 MG capsule  Take 20 mg by mouth daily.    Yes [provider]  furosemide (LASIX) 20 MG tablet TAKE 2 TABLETS BY MOUTH EVERY DAY Patient taking differently: Take 20 mg by mouth 2 (two) times daily with breakfast and lunch.  02/20/20  Yes Hilty, Nadean Corwin, MD  Homeopathic Products ([place]) Apply 1 application topically 2 (two) times daily as needed (pain).   Yes [provider]  hydrALAZINE (APRESOLINE) 50 MG tablet Take 1 tablet (50 mg total) by mouth every 8 (eight) hours. Patient taking differently: Take 50 mg by mouth every 8 (eight) hours. For blood pressure 06/03/15  Yes Barrett, Rhonda G, PA-C  insulin detemir (LEVEMIR) 100 UNIT/ML injection Inject 0.1 mLs (10 Units total) into the skin at bedtime. Patient taking differently: Inject 12 Units into the skin at bedtime.  06/28/18  Yes Isaac Bliss, Rayford Halsted, MD  ipratropium (ATROVENT) 0.03 % nasal spray Place 1 spray into both nostrils daily as needed for rhinitis (sneezing).  11/04/19  Yes [provider]  Linaclotide (LINZESS) 145 MCG CAPS capsule Take 145 mcg by mouth daily as needed (for constipation).   Yes [provider]  lisinopril (PRINIVIL,ZESTRIL) 10 MG tablet Take 1 tablet (10 mg  total) by mouth daily. 06/03/15  Yes Barrett, Evelene Croon, PA-C  loperamide (IMODIUM) 2 MG capsule Take 2 mg by mouth 2 (two) times daily as needed for diarrhea or loose stools.    Yes [provider]  Memantine HCl ER (NAMENDA XR) 28 MG CP24 Take 28 mg by mouth daily. for dementia   Yes [provider]  metFORMIN (GLUCOPHAGE-XR) 500 MG 24 hr tablet Take 500 mg by mouth 2 (two) times daily with breakfast and lunch. To control blood sugar. Do not crush. 03/16/15  Yes [provider]  Multiple Vitamin (MULTIVITAMIN WITH MINERALS) TABS tablet Take 1 tablet by mouth daily.   Yes [provider]  Naphazoline HCl (CLEAR EYES OP) Place 1 drop into both eyes daily as needed (dry eyes).   Yes [provider]  nebivolol (BYSTOLIC) 10 MG tablet Take 10 mg by mouth daily. for blood pressure   Yes [provider]  NYSTATIN powder Apply 1 application topically 2 (two) times daily as needed for for breast care. 11/15/16  Yes [provider]  potassium chloride SA (K-DUR,KLOR-CON) 20 MEQ tablet Take 20 mEq by mouth 2 (two) times daily with breakfast and lunch.    Yes [provider]    Current Facility-Administered Medications  Medication Dose Route Frequency Provider Last Rate Last Admin   0.9 %  sodium chloride infusion (Manually program via Guardrails IV Fluids)   Intravenous Once Fair, Marin Shutter, MD       acetaminophen (TYLENOL) tablet 500 mg  500 mg Oral BID Fair, Marin Shutter, MD   500 mg at 02/22/20 2231   albuterol (PROVENTIL) (2.5 MG/3ML) 0.083% nebulizer solution 2.5 mg  2.5 mg Inhalation Q6H PRN Fair, Marin Shutter, MD       ALPRAZolam Duanne Moron) tablet 0.5 mg  0.5 mg Oral QHS Fair, Chelsea N, MD   0.5 mg at 02/22/20 2230   And   ALPRAZolam (XANAX) tablet 0.25 mg  0.25 mg Oral Daily Fair, Marin Shutter, MD       atorvastatin (LIPITOR) tablet 80 mg  80 mg Oral Q lunch Fair, Chelsea N, MD       cefTRIAXone (ROCEPHIN) 1 g in sodium chloride 0.9  % 100 mL IVPB  1 g Intravenous  Q24H Chauncey Mann, MD       donepezil (ARICEPT) tablet 10 mg  10 mg Oral QHS Fair, Marin Shutter, MD   10 mg at 02/22/20 2231   fluticasone furoate-vilanterol (BREO ELLIPTA) 100-25 MCG/INH 1 puff  1 puff Inhalation Daily Fair, Marin Shutter, MD   1 puff at 02/23/20 0749   insulin aspart (novoLOG) injection 0-9 Units  0-9 Units Subcutaneous TID WC Fair, Marin Shutter, MD       lip balm (CARMEX) ointment   Topical PRN Fair, Marin Shutter, MD       memantine (NAMENDA XR) 24 hr capsule 28 mg  28 mg Oral Daily Fair, Marin Shutter, MD       pantoprazole (PROTONIX) injection 40 mg  40 mg Intravenous BID Chauncey Mann, MD        Allergies as of 02/22/2020 - Review Complete 02/22/2020  Allergen Reaction Noted   Tape Other (See Comments) 11/20/2016     Review of Systems:    Unable to complete due to patient's advanced dementia   Physical Exam:  Vital signs in last 24 hours: Temp:  [98.1 F (36.7 C)-99.2 F (37.3 C)] 99.2 F (37.3 C) (03/21 0304) Pulse Rate:  [73-85] 85 (03/21 0304) Resp:  [16-19] 16 (03/21 0304) BP: (136-161)/(49-60) 161/51 (03/21 0304) SpO2:  [100 %] 100 % (03/21 0751) FiO2 (%):  [28 %] 28 % (03/21 0751) Last BM Date: 02/21/20 General:   Pleasantly demented Elderly AA female appears to be in NAD, Well developed, Well nourished, alert and cooperative Head:  Normocephalic and atraumatic. Eyes:   PEERL, EOMI. No icterus. Conjunctiva pink. Ears:  Normal auditory acuity. Neck:  Supple Throat: Oral cavity and pharynx without inflammation, swelling or lesion.  Lungs: Respirations even and unlabored. Lungs clear to auscultation bilaterally.   No wheezes, crackles, or rhonchi.  Heart: Normal S1, S2. No MRG. Regular rate and rhythm. No peripheral edema, cyanosis or pallor.  Abdomen:  Soft, nondistended, nontender. No rebound or guarding. Normal bowel sounds. No appreciable masses or hepatomegaly. Rectal:  Not performed.  Msk:  Symmetrical without gross  deformities. Peripheral pulses intact.  Extremities:  Without edema, no deformity or joint abnormality. Neurologic:  Alert, not oriented;  grossly normal neurologically.  Skin:   Dry and intact without significant lesions or rashes. Psychiatric: Advanced dementia   LAB RESULTS: Recent Labs    02/22/20 2139 02/23/20 0425  WBC  --  9.6  HGB 8.7* 7.8*  HCT 25.2* 22.5*  PLT  --  167   BMET Recent Labs    02/23/20 0425  NA 147*  K 3.9  CL 121*  CO2 19*  GLUCOSE 133*  BUN 50*  CREATININE 1.33*  CALCIUM 9.4   LFT Recent Labs    02/23/20 0425  PROT 5.0*  ALBUMIN 2.7*  AST 22  ALT 17  ALKPHOS 46  BILITOT 0.7     Impression / Plan:   Impression: 1.  GI bleed: Report of melena x2 days with Hemoccult positive stool and hemoglobin of 6.2 at outside facility--> 2 units PRBCs--> 8.7--> 7.8, no further melena since time of admission overnight, history of leiomyoma in the stomach question if this is the source versus other  2.  Acute cystitis: UA with leukocytes, given Rocephin 1 g with plans to continue for 2 more doses 3.  Advanced dementia 4.  CHF/CAD: Plavix has been held as of yesterday, patient status post CABG and TAVR, last echo November 2019 with EF 65-70%  Plan: 1.  Plan for EGD.  Timing for Dr. Hilarie Fredrickson. 2.  Continue to monitor hemoglobin every 8 hours today with transfusion as needed less than 7 3.  Patient remain n.p.o. this morning, we are hoping to get procedure done today. 4.  Continue Pantoprazole 40 mg IV twice daily 5.  Please await further recommendations from Dr. Hilarie Fredrickson later today.  Thank you for your kind consultation, we will continue to follow.  Lavone Nian Talor Cheema  02/23/2020, 8:30 AM

## 2020-02-23 NOTE — Progress Notes (Addendum)
PROGRESS NOTE  Katherine [name] ZSW:[id number] DOB: 17-Dec-1927 DOA: 02/22/2020 PCP: Celene Squibb, MD  Brief summary: Katherine [name] is a 86 y.o. female with PMH CAD s/p CABG x 4, morbid obesity, HTN, HLD, DM II,and h/o severe AS s/p TAVRwho transferred [facility] after presenting to outside hospital for weakness and found to have Hgb 6.2 presumed secondary to GI bleed.   Patient with advanced dementia and therefore all history obtained from daughter [name].  Daughter reports that this morning this patient seemed less alert and more confused than normal and also more weak. She can walk with a walker at baseline and went into the bathroom this morning and was then unable to get up. [name] reports frequent UTI's and this is what she assumed was the problem again, she took her to [facility] in [place], [place] where she was found to have Hgb 6.2 and positive hemoccult POC. Patient was given 2 units of blood and transferred to [facility]. [name] reports that she had noted darker stools over past 2 days but reports that this sometimes happens and she did not think anything of it. She has not noted any other concerns. At baseline she can have limited conversations. She lives with [name] and they have aides come during the day to help care for her. She now notes that the patient does appear pale. She has been eating normally. Denies fever, cough, SOB, vomiting, diarrhea, constipation, hematuria, difficulty moving arms/legs, speech difficulty.  She is started on Rocephin for possible UTI, follow-up on urine culture GI consulted, EGD planned for tomorrow   HPI/Recap of past 24 hours:  Demented elderly, only oriented to self ,does not appear to be in acute distress  Assessment/Plan: Principal Problem:   GI bleed Active Problems:   Diabetes mellitus type 2, uncontrolled ([place])   Hyperlipidemia   Anxiety state   Essential hypertension   Coronary atherosclerosis   S/P CABG x 4   Morbid obesity ([place])  S/p TAVR (transcatheter aortic valve replacement), bioprosthetic   Chronic diastolic heart failure (HCC)   Urinary tract infection   Chronic diastolic CHF (congestive heart failure) ([place])   Advanced dementia (HCC)  GI bleed/acute blood loss anemia -report of melena x2 days with Hgb 6.2 at outside facility -Received 2 units PRBC at outside hospital -IV Protonix twice daily -Hold Plavix -Hemodynamically stable, monitor CBC -GI consulted, plan for EGD in the morning   Acute Cystitis - UA with large leuks and many WBC's at outside ER - s/p Rocephin 1 g; will cont for 2 more doses -  urine cx here although may not be helpful as patient has already received abx  AKI versus progressive CKD -BUN 50/creatinine 1.33, baseline creatinine from 2019 was 0.86 -Continue hydration, treating possible UTI -Repeat BMP in the morning, renal dosing meds  Hyponatremia -Sodium 147, she appears dehydrated -Hydration, repeat labs in the morning  HTN - Hold PTA Lisinopril, Nebivolol, Lasix and Hydralazine due to risk for acute blood loss and hypotension -Currently BP low normal, she appears dehydrated, start gentle hydration - restart when blood pressure stable  Insulin-dependent type 2 diabetes -A1c pending -Hold Metformin -A.m. blood glucose 133 -On SSI for now  CAD s/p CABG x 4, s/p TAVR -Last Echo Nov 2019 with EF 32-20%, grade I diastolic dysfunction - Last saw Cards 02/13/2020 -Continue statin -Hold beta-blocker due to borderline BP, resume when BP stable -Hold Plavix due to GI bleed  Chronic diastolic CHF, currently appears dehydrated -Hold Lasix -On  gentle hydration -Close monitor volume status   Dementia -On Aricept, Namenda   DVT Prophylaxis:scd  Code Status: full  Family Communication: Daughter [name] of over the phone on March 21  Disposition Plan:    Patient came from:            Home                                                                                                Anticipated d/c place:  To be determined  Barriers to d/c OR conditions which need to be met to effect a safe d/c:  Need EGD   Consultants:  GI  Procedures:  EGD planned on March 22  Antibiotics:  Rocephin   Objective: BP (!) 161/51 (BP Location: Right Arm)   Pulse 85   Temp 99.2 F (37.3 C) (Oral)   Resp 16   SpO2 100%   Intake/Output Summary (Last 24 hours) at 02/23/2020 0744 Last data filed at 02/23/2020 0636 Gross per 24 hour  Intake 30 ml  Output 650 ml  Net -620 ml   There were no vitals filed for this visit.  Exam: Patient is examined daily including today on 02/23/2020, exams remain the same as of yesterday except that has changed    General: Demented, NAD  Cardiovascular: RRR  Respiratory: CTABL  Abdomen: Soft/ND/NT, positive BS  Musculoskeletal: No Edema  Neuro: alert, oriented to self only  Data Reviewed: Basic Metabolic Panel: Recent Labs  Lab 02/23/20 0425  NA 147*  K 3.9  CL 121*  CO2 19*  GLUCOSE 133*  BUN 50*  CREATININE 1.33*  CALCIUM 9.4  MG 1.7   Liver Function Tests: Recent Labs  Lab 02/23/20 0425  AST 22  ALT 17  ALKPHOS 46  BILITOT 0.7  PROT 5.0*  ALBUMIN 2.7*   No results for input(s): LIPASE, AMYLASE in the last 168 hours. No results for input(s): AMMONIA in the last 168 hours. CBC: Recent Labs  Lab 02/22/20 2139 02/23/20 0425  WBC  --  9.6  HGB 8.7* 7.8*  HCT 25.2* 22.5*  MCV  --  86.2  PLT  --  167   Cardiac Enzymes:   No results for input(s): CKTOTAL, CKMB, CKMBINDEX, TROPONINI in the last 168 hours. BNP (last 3 results) No results for input(s): BNP in the last 8760 hours.  ProBNP (last 3 results) No results for input(s): PROBNP in the last 8760 hours.  CBG: Recent Labs  Lab 02/23/20 0648  GLUCAP 124*    No results found for this or any previous visit (from the past 240 hour(s)).   Studies: No results found.  Scheduled Meds: . sodium chloride   Intravenous Once  .  acetaminophen  500 mg Oral BID  . ALPRAZolam  0.5 mg Oral QHS   And  . ALPRAZolam  0.25 mg Oral Daily  . atorvastatin  80 mg Oral Q lunch  . donepezil  10 mg Oral QHS  . fluticasone furoate-vilanterol  1 puff Inhalation Daily  . insulin aspart  0-9 Units Subcutaneous TID WC  . memantine  28 mg  Oral Daily  . pantoprazole (PROTONIX) IV  40 mg Intravenous BID    Continuous Infusions: . cefTRIAXone (ROCEPHIN)  IV       Time spent: 31mns I have personally reviewed and interpreted on  02/23/2020 daily labs,  imagings as discussed above under date review session and assessment and plans.  I reviewed all nursing notes, pharmacy notes, consultant notes,  vitals, pertinent old records  I have discussed plan of care as described above with RN , patient and family on 02/23/2020   FFlorencia ReasonsMD, PhD, FACP  [facility]  Available via Epic secure chat 7am-7pm for nonurgent issues Please page for urgent issues, pager number available through [email or web address] .   02/23/2020, 7:44 AM  LOS: 1 day

## 2020-02-23 NOTE — Plan of Care (Signed)
  Problem: Clinical Measurements: Goal: Ability to maintain clinical measurements within normal limits will improve Outcome: Progressing   

## 2020-02-23 NOTE — H&P (View-Only) (Signed)
Consultation  Referring Provider: Dr.Xu    Primary Care Physician:  Celene Squibb, MD Primary Gastroenterologist: Dr. Henrene Pastor         Reason for Consultation: GI bleed, acute blood loss anemia             HPI:   Katherine [name] is a 86 y.o. female with a past medical history of CAD status post CABG x4, morbid obesity, diabetes type 2 and history of severe aortic stenosis status post TAVR who transferred to [facility] after presenting to outside hospital for weakness and altered mental status on 02/22/2020.  We are consulted after the patient was found to have a hemoglobin of 6.2 presumed secondary to GI bleed with melenic stools.    Patient has advanced dementia and history is garnered from previous physicians notes in the chart as well as daughter who is in the room.  Apparently yesterday the daughter reported that the patient seemed less alert in the morning with confusion and weakness.  At baseline patient can walk with a walker and went into the bathroom yesterday morning and was unable to get up.  Patient is known to have frequent UTIs and this was presumed to be the problem again, she was taken to [place] in [place] where she was found to have a hemoglobin of 6.2 and positive Hemoccult.  She was given 2 units of blood and transferred to [facility].  Her daughter reports noticing some darker stools over the past 2 days, but "sometimes this happens".  Per her knowledge patient has not any further bowel movement since admission.  She complains of abdominal pain off and on but "she has dementia and complains of pain occasionally".  Denies use of NSAIDs.     At baseline patient can have limited conversations.  She lives with her daughter and they have some home health aides.    Daughter denied any fever, cough, shortness of breath, vomiting, diarrhea, constipation, hematuria, difficulty moving arms/legs or speech difficulty.     ER course: Hemoglobin 6.2, otherwise normal CBC,  Hemoccult positive, creatinine 1.4 (unknown baseline), sodium 146, potassium 4.1, UA with large leukocytes and negative nitrite and many WBCs, Covid negative, CT head and chest x-ray without acute findings, patient given Protonix 80 mg and Rocephin 1 g  Previous GI history: 08/23/2011 EGD with mass, hiatal hernia and otherwise normal (no change in large submucosal mass gastric cardia consistent with leiomyoma which is thought source of bleeding 03/11/2010 colonoscopy done for history of colon polyps, normal other than internal hemorrhoids-diagnosed with IBS at that time  Past Medical History:  Diagnosis Date  . Acute bronchitis 12/16/2015  . Acute exacerbation of CHF (congestive heart failure) ([place]) 11/20/2016  . Acute metabolic encephalopathy 1/76/1607  . Anemia   . Anxiety   . Aortic valvular stenosis    moderate to severe  EF on 2D Echo 10/22/2012 65-70%  valve area of 1.2cm, and peak and mean gradients of 45 and 38mmHg  . Bronchospasm 12/16/2015  . Chalazion of right upper eyelid early June 2016   treating with cream, Rx by Dr. Merlyn Albert    . CHF (congestive heart failure) ([place])   . Congestive heart failure ([name]) 03/04/2010   Qualifier: Diagnosis of  By: Tamala Julian CMA, Claiborne Billings    . Coronary artery disease   . Dementia ([place])   . Diabetes mellitus without complication ([place]) 3/71/0626  . Diabetes mellitus, type 2 ([place])   . Dyspnea  chronic  . Easy fatigability   . Gastric mass   . Gastric ulcer   . GERD (gastroesophageal reflux disease)   . Heart murmur   . Hernia of unspecified site of abdominal cavity without mention of obstruction or gangrene    hiatal  . Hyperlipidemia   . Hypertension   . Hypokalemia 11/23/2016  . Normal cardiac stress test 12/2013   normal stress nuclear study  . Osteoarthritis   . Pain due to onychomycosis of toenails of both feet 06/04/2019  . Thyroid mass     Past Surgical History:  Procedure Laterality Date  . AORTIC VALVE SURGERY    . CATARACT EXTRACTION  Bilateral   . CORONARY ARTERY BYPASS GRAFT     x4 in 2003  . internal hemorrhoidectomy    . KNEE SURGERY Right   . LEFT AND RIGHT HEART CATHETERIZATION WITH CORONARY/GRAFT ANGIOGRAM N/A 03/24/2015   Procedure: LEFT AND RIGHT HEART CATHETERIZATION WITH Beatrix Fetters;  Surgeon: Burnell Blanks, MD;  Location: [facility] CATH LAB;  Service: Cardiovascular;  Laterality: N/A;  . TEE WITHOUT CARDIOVERSION N/A 05/26/2015   Procedure: TRANSESOPHAGEAL ECHOCARDIOGRAM (TEE);  Surgeon: Burnell Blanks, MD;  Location: [place];  Service: Open Heart Surgery;  Laterality: N/A;  . TRANSCATHETER AORTIC VALVE REPLACEMENT, TRANSFEMORAL N/A 05/26/2015   Procedure: TRANSCATHETER AORTIC VALVE REPLACEMENT, TRANSFEMORAL;  Surgeon: Burnell Blanks, MD;  Location: [place];  Service: Open Heart Surgery;  Laterality: N/A;  . TUBAL LIGATION    . uterine polypectomy      Family History  Problem Relation Age of Onset  . Diabetes Mother   . Heart disease Mother   . Stroke Mother   . Cerebral aneurysm Mother   . Stroke Father   . Heart attack Brother   . Colon cancer Neg Hx     Social History   Tobacco Use  . Smoking status: Never Smoker  . Smokeless tobacco: Never Used  Substance Use Topics  . Alcohol use: No  . Drug use: No    Prior to Admission medications   Medication Sig Start Date End Date Taking? Authorizing Provider  acetaminophen (TYLENOL) 500 MG tablet Take 1,000 mg by mouth 2 (two) times daily. for pain. Do not exceed 4gm of Tylenol in 24 hours   Yes [provider]  albuterol (VENTOLIN HFA) 108 (90 Base) MCG/ACT inhaler Inhale 1 puff into the lungs every 6 (six) hours as needed for wheezing or shortness of breath.   Yes [provider]  ALPRAZolam (XANAX) 0.5 MG tablet Take 0.125-0.5 mg by mouth See admin instructions. Take 1/4 tablet (0.125 mg) by mouth every morning and 1 tablet (0.5 mg) at night, may also take 1/4 tablet (0.125 mg) with lunch as needed for  anxiety. 02/11/20  Yes [provider]  atorvastatin (LIPITOR) 80 MG tablet Take 80 mg by mouth daily with lunch.    Yes [provider]  budesonide-formoterol (SYMBICORT) 80-4.5 MCG/ACT inhaler Inhale 2 puffs into the lungs 2 (two) times daily as needed (shortness of breath/wheezing).   Yes [provider]  cetirizine (ZYRTEC) 10 MG tablet Take 10 mg by mouth daily as needed for allergies or rhinitis.   Yes [provider]  clopidogrel (PLAVIX) 75 MG tablet Take 1 tablet (75 mg total) by mouth daily with breakfast. 06/03/15  Yes Barrett, Rhonda G, PA-C  donepezil (ARICEPT) 10 MG tablet Take 10 mg by mouth at bedtime. 02/07/20  Yes [provider]  esomeprazole (NEXIUM) 20 MG capsule  Take 20 mg by mouth daily.    Yes [provider]  furosemide (LASIX) 20 MG tablet TAKE 2 TABLETS BY MOUTH EVERY DAY Patient taking differently: Take 20 mg by mouth 2 (two) times daily with breakfast and lunch.  02/20/20  Yes Hilty, Nadean Corwin, MD  Homeopathic Products ([place]) Apply 1 application topically 2 (two) times daily as needed (pain).   Yes [provider]  hydrALAZINE (APRESOLINE) 50 MG tablet Take 1 tablet (50 mg total) by mouth every 8 (eight) hours. Patient taking differently: Take 50 mg by mouth every 8 (eight) hours. For blood pressure 06/03/15  Yes Barrett, Rhonda G, PA-C  insulin detemir (LEVEMIR) 100 UNIT/ML injection Inject 0.1 mLs (10 Units total) into the skin at bedtime. Patient taking differently: Inject 12 Units into the skin at bedtime.  06/28/18  Yes Isaac Bliss, Rayford Halsted, MD  ipratropium (ATROVENT) 0.03 % nasal spray Place 1 spray into both nostrils daily as needed for rhinitis (sneezing).  11/04/19  Yes [provider]  Linaclotide (LINZESS) 145 MCG CAPS capsule Take 145 mcg by mouth daily as needed (for constipation).   Yes [provider]  lisinopril (PRINIVIL,ZESTRIL) 10 MG tablet Take 1 tablet (10 mg  total) by mouth daily. 06/03/15  Yes Barrett, Evelene Croon, PA-C  loperamide (IMODIUM) 2 MG capsule Take 2 mg by mouth 2 (two) times daily as needed for diarrhea or loose stools.    Yes [provider]  Memantine HCl ER (NAMENDA XR) 28 MG CP24 Take 28 mg by mouth daily. for dementia   Yes [provider]  metFORMIN (GLUCOPHAGE-XR) 500 MG 24 hr tablet Take 500 mg by mouth 2 (two) times daily with breakfast and lunch. To control blood sugar. Do not crush. 03/16/15  Yes [provider]  Multiple Vitamin (MULTIVITAMIN WITH MINERALS) TABS tablet Take 1 tablet by mouth daily.   Yes [provider]  Naphazoline HCl (CLEAR EYES OP) Place 1 drop into both eyes daily as needed (dry eyes).   Yes [provider]  nebivolol (BYSTOLIC) 10 MG tablet Take 10 mg by mouth daily. for blood pressure   Yes [provider]  NYSTATIN powder Apply 1 application topically 2 (two) times daily as needed for for breast care. 11/15/16  Yes [provider]  potassium chloride SA (K-DUR,KLOR-CON) 20 MEQ tablet Take 20 mEq by mouth 2 (two) times daily with breakfast and lunch.    Yes [provider]    Current Facility-Administered Medications  Medication Dose Route Frequency Provider Last Rate Last Admin  . 0.9 %  sodium chloride infusion (Manually program via Guardrails IV Fluids)   Intravenous Once Fair, Marin Shutter, MD      . acetaminophen (TYLENOL) tablet 500 mg  500 mg Oral BID Chauncey Mann, MD   500 mg at 02/22/20 2231  . albuterol (PROVENTIL) (2.5 MG/3ML) 0.083% nebulizer solution 2.5 mg  2.5 mg Inhalation Q6H PRN Fair, Marin Shutter, MD      . ALPRAZolam Duanne Moron) tablet 0.5 mg  0.5 mg Oral QHS Fair, Marin Shutter, MD   0.5 mg at 02/22/20 2230   And  . ALPRAZolam (XANAX) tablet 0.25 mg  0.25 mg Oral Daily Fair, Chelsea N, MD      . atorvastatin (LIPITOR) tablet 80 mg  80 mg Oral Q lunch Fair, Chelsea N, MD      . cefTRIAXone (ROCEPHIN) 1 g in sodium chloride 0.9  % 100 mL IVPB  1 g Intravenous  Q24H Fair, Marin Shutter, MD      . donepezil (ARICEPT) tablet 10 mg  10 mg Oral QHS Chauncey Mann, MD   10 mg at 02/22/20 2231  . fluticasone furoate-vilanterol (BREO ELLIPTA) 100-25 MCG/INH 1 puff  1 puff Inhalation Daily Fair, Marin Shutter, MD   1 puff at 02/23/20 0749  . insulin aspart (novoLOG) injection 0-9 Units  0-9 Units Subcutaneous TID WC Fair, Chelsea N, MD      . lip balm (CARMEX) ointment   Topical PRN Fair, Marin Shutter, MD      . memantine (NAMENDA XR) 24 hr capsule 28 mg  28 mg Oral Daily Fair, Chelsea N, MD      . pantoprazole (PROTONIX) injection 40 mg  40 mg Intravenous BID Chauncey Mann, MD        Allergies as of 02/22/2020 - Review Complete 02/22/2020  Allergen Reaction Noted  . Tape Other (See Comments) 11/20/2016     Review of Systems:    Unable to complete due to patient's advanced dementia   Physical Exam:  Vital signs in last 24 hours: Temp:  [98.1 F (36.7 C)-99.2 F (37.3 C)] 99.2 F (37.3 C) (03/21 0304) Pulse Rate:  [73-85] 85 (03/21 0304) Resp:  [16-19] 16 (03/21 0304) BP: (136-161)/(49-60) 161/51 (03/21 0304) SpO2:  [100 %] 100 % (03/21 0751) FiO2 (%):  [28 %] 28 % (03/21 0751) Last BM Date: 02/21/20 General:   Pleasantly demented Elderly AA female appears to be in NAD, Well developed, Well nourished, alert and cooperative Head:  Normocephalic and atraumatic. Eyes:   PEERL, EOMI. No icterus. Conjunctiva pink. Ears:  Normal auditory acuity. Neck:  Supple Throat: Oral cavity and pharynx without inflammation, swelling or lesion.  Lungs: Respirations even and unlabored. Lungs clear to auscultation bilaterally.   No wheezes, crackles, or rhonchi.  Heart: Normal S1, S2. No MRG. Regular rate and rhythm. No peripheral edema, cyanosis or pallor.  Abdomen:  Soft, nondistended, nontender. No rebound or guarding. Normal bowel sounds. No appreciable masses or hepatomegaly. Rectal:  Not performed.  Msk:  Symmetrical without gross  deformities. Peripheral pulses intact.  Extremities:  Without edema, no deformity or joint abnormality. Neurologic:  Alert, not oriented;  grossly normal neurologically.  Skin:   Dry and intact without significant lesions or rashes. Psychiatric: Advanced dementia   LAB RESULTS: Recent Labs    02/22/20 2139 02/23/20 0425  WBC  --  9.6  HGB 8.7* 7.8*  HCT 25.2* 22.5*  PLT  --  167   BMET Recent Labs    02/23/20 0425  NA 147*  K 3.9  CL 121*  CO2 19*  GLUCOSE 133*  BUN 50*  CREATININE 1.33*  CALCIUM 9.4   LFT Recent Labs    02/23/20 0425  PROT 5.0*  ALBUMIN 2.7*  AST 22  ALT 17  ALKPHOS 46  BILITOT 0.7     Impression / Plan:   Impression: 1.  GI bleed: Report of melena x2 days with Hemoccult positive stool and hemoglobin of 6.2 at outside facility--> 2 units PRBCs--> 8.7--> 7.8, no further melena since time of admission overnight, history of leiomyoma in the stomach question if this is the source versus other  2.  Acute cystitis: UA with leukocytes, given Rocephin 1 g with plans to continue for 2 more doses 3.  Advanced dementia 4.  CHF/CAD: Plavix has been held as of yesterday, patient status post CABG and TAVR, last echo November 2019 with EF 65-70%  Plan: 1.  Plan for EGD.  Timing for Dr. Hilarie Fredrickson. 2.  Continue to monitor hemoglobin every 8 hours today with transfusion as needed less than 7 3.  Patient remain n.p.o. this morning, we are hoping to get procedure done today. 4.  Continue Pantoprazole 40 mg IV twice daily 5.  Please await further recommendations from Dr. Hilarie Fredrickson later today.  Thank you for your kind consultation, we will continue to follow.  Katherine [name]  02/23/2020, 8:30 AM

## 2020-02-23 NOTE — Progress Notes (Signed)
Daughter is concerned that pt is sounding congested. Lungs sound clear but diminished. Pt is unable to really take a deep breathe when listening, she just breathes normal when asked to take a deep breathe. Daughter just wanted MD to be aware of her concern. RN messaged NP on call to make aware.   Eleanora Neighbor, RN

## 2020-02-24 ENCOUNTER — Encounter (HOSPITAL_COMMUNITY): Payer: Self-pay | Admitting: Family Medicine

## 2020-02-24 ENCOUNTER — Inpatient Hospital Stay (HOSPITAL_COMMUNITY): Payer: Medicare Other | Admitting: Anesthesiology

## 2020-02-24 ENCOUNTER — Encounter (HOSPITAL_COMMUNITY)
Admission: AD | Disposition: A | Discharge status: Skilled Nursing Facility | Payer: Self-pay | Source: Other Acute Inpatient Hospital | Attending: Internal Medicine

## 2020-02-24 DIAGNOSIS — K222 Esophageal obstruction: Secondary | ICD-10-CM

## 2020-02-24 DIAGNOSIS — D490 Neoplasm of unspecified behavior of digestive system: Secondary | ICD-10-CM

## 2020-02-24 DIAGNOSIS — F0280 Dementia in other diseases classified elsewhere without behavioral disturbance: Secondary | ICD-10-CM

## 2020-02-24 DIAGNOSIS — I25119 Atherosclerotic heart disease of native coronary artery with unspecified angina pectoris: Secondary | ICD-10-CM

## 2020-02-24 DIAGNOSIS — K921 Melena: Secondary | ICD-10-CM

## 2020-02-24 DIAGNOSIS — E1165 Type 2 diabetes mellitus with hyperglycemia: Secondary | ICD-10-CM

## 2020-02-24 DIAGNOSIS — G301 Alzheimer's disease with late onset: Secondary | ICD-10-CM

## 2020-02-24 DIAGNOSIS — I10 Essential (primary) hypertension: Secondary | ICD-10-CM

## 2020-02-24 DIAGNOSIS — K922 Gastrointestinal hemorrhage, unspecified: Secondary | ICD-10-CM

## 2020-02-24 DIAGNOSIS — F0390 Unspecified dementia without behavioral disturbance: Secondary | ICD-10-CM

## 2020-02-24 HISTORY — PX: ESOPHAGOGASTRODUODENOSCOPY (EGD) WITH PROPOFOL: SHX5813

## 2020-02-24 LAB — CBC
HCT: 22.1 % — ABNORMAL LOW (ref 36.0–46.0)
HCT: 23.5 % — ABNORMAL LOW (ref 36.0–46.0)
Hemoglobin: 7.3 g/dL — ABNORMAL LOW (ref 12.0–15.0)
Hemoglobin: 7.8 g/dL — ABNORMAL LOW (ref 12.0–15.0)
MCH: 29.2 pg (ref 26.0–34.0)
MCH: 30.1 pg (ref 26.0–34.0)
MCHC: 33 g/dL (ref 30.0–36.0)
MCHC: 33.2 g/dL (ref 30.0–36.0)
MCV: 88.4 fL (ref 80.0–100.0)
MCV: 90.7 fL (ref 80.0–100.0)
Platelets: 170 10*3/uL (ref 150–400)
Platelets: 185 10*3/uL (ref 150–400)
RBC: 2.5 MIL/uL — ABNORMAL LOW (ref 3.87–5.11)
RBC: 2.59 MIL/uL — ABNORMAL LOW (ref 3.87–5.11)
RDW: 15.2 % (ref 11.5–15.5)
RDW: 15.7 % — ABNORMAL HIGH (ref 11.5–15.5)
WBC: 7.2 10*3/uL (ref 4.0–10.5)
WBC: 8.2 10*3/uL (ref 4.0–10.5)
nRBC: 0 % (ref 0.0–0.2)
nRBC: 0 % (ref 0.0–0.2)

## 2020-02-24 LAB — COMPREHENSIVE METABOLIC PANEL
ALT: 16 U/L (ref 0–44)
AST: 20 U/L (ref 15–41)
Albumin: 2.5 g/dL — ABNORMAL LOW (ref 3.5–5.0)
Alkaline Phosphatase: 43 U/L (ref 38–126)
Anion gap: 9 (ref 5–15)
BUN: 30 mg/dL — ABNORMAL HIGH (ref 8–23)
CO2: 23 mmol/L (ref 22–32)
Calcium: 9.9 mg/dL (ref 8.9–10.3)
Chloride: 118 mmol/L — ABNORMAL HIGH (ref 98–111)
Creatinine, Ser: 1.18 mg/dL — ABNORMAL HIGH (ref 0.44–1.00)
GFR calc Af Amer: 47 mL/min — ABNORMAL LOW (ref >60)
GFR calc non Af Amer: 41 mL/min — ABNORMAL LOW (ref >60)
Glucose, Bld: 159 mg/dL — ABNORMAL HIGH (ref 70–99)
Potassium: 3.8 mmol/L (ref 3.5–5.1)
Sodium: 150 mmol/L — ABNORMAL HIGH (ref 135–145)
Total Bilirubin: 0.2 mg/dL — ABNORMAL LOW (ref 0.3–1.2)
Total Protein: 5 g/dL — ABNORMAL LOW (ref 6.5–8.1)

## 2020-02-24 LAB — URINE CULTURE

## 2020-02-24 LAB — GLUCOSE, CAPILLARY
Glucose-Capillary: 136 mg/dL — ABNORMAL HIGH (ref 70–99)
Glucose-Capillary: 134 mg/dL — ABNORMAL HIGH (ref 70–99)
Glucose-Capillary: 122 mg/dL — ABNORMAL HIGH (ref 70–99)
Glucose-Capillary: 136 mg/dL — ABNORMAL HIGH (ref 70–99)
Glucose-Capillary: 171 mg/dL — ABNORMAL HIGH (ref 70–99)
Glucose-Capillary: 129 mg/dL — ABNORMAL HIGH (ref 70–99)

## 2020-02-24 SURGERY — ESOPHAGOGASTRODUODENOSCOPY (EGD) WITH PROPOFOL
Anesthesia: Monitor Anesthesia Care

## 2020-02-24 MED ORDER — SODIUM CHLORIDE 0.9 % IV SOLN: INTRAVENOUS | Status: DC

## 2020-02-24 MED ORDER — PROPOFOL 500 MG/50ML IV EMUL
INTRAVENOUS | Status: DC | PRN
  Administered 2020-02-24: 100 ug/kg/min via INTRAVENOUS

## 2020-02-24 MED ORDER — PROPOFOL 10 MG/ML IV BOLUS
INTRAVENOUS | Status: DC | PRN
  Administered 2020-02-24 (×2): 10 mg via INTRAVENOUS

## 2020-02-24 MED ORDER — SODIUM CHLORIDE 0.45 % IV SOLN: INTRAVENOUS | Status: AC

## 2020-02-24 MED ORDER — LACTATED RINGERS IV SOLN: INTRAVENOUS | Status: DC

## 2020-02-24 MED ORDER — PANTOPRAZOLE SODIUM 40 MG PO TBEC
40.0000 mg | DELAYED_RELEASE_TABLET | Freq: Two times a day (BID) | ORAL | Status: DC
  Administered 2020-02-24 – 2020-03-02 (×14): 40 mg via ORAL
  Filled 2020-02-24 (×16): qty 1

## 2020-02-24 SURGICAL SUPPLY — 15 items

## 2020-02-24 NOTE — Consult Note (Signed)
[facility] Surgery Consult Note  Katherine [name] [birth year]  [id number].    Requesting MD: [name] Chief Complaint/Reason for Consult: gastric fundus mass  HPI:  Katherine [name] is a 86yo female with multiple medical problems including CAD, h/o CABG 2003, h/o TAVR on plavix (last does possibly as recent as 3/20), CHF, DM, and dementia who was transferred from [place], [place] to [facility] for weakness and found to have Hgb 6.2 presumed secondary to GI bleed. No family currently at bedside. Per chart patient seemed less alert and more confused than normal and also more weak. She had also had darker stool x2 days which happens intermittently.  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 Laural Golden. I spoke with the patient's daughter, Katherine [name], who was unsure when this was found or if she ever saw a surgeon regarding resection. She underwent upper endoscopy today by Dr. 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 She was transfused 2 units PRBCs 3/20, hemoglobin has remained stable since then. General surgery asked to see.  Currently denies any abdominal pain, nausea, vomiting. Feels hungry.  Abdominal surgical history: tubal ligation Lives at home with her daughters. Mostly sedentary but will ambulate short distances with a walker  ROS: Review of Systems  Unable to perform ROS: Mental status change  2/2 dementia and recent anesthesia  Family History  Problem Relation Age of Onset  . Diabetes Mother   . Heart disease Mother   . Stroke Mother   . Cerebral aneurysm Mother   . Stroke Father   . Heart attack Brother   . Colon cancer Neg Hx     Past Medical History:  Diagnosis Date  . Acute bronchitis 12/16/2015  . Acute exacerbation of CHF (congestive heart failure) ([place]) 11/20/2016  . Acute metabolic  encephalopathy 6/59/9357  . Anemia   . Anxiety   . Aortic valvular stenosis    moderate to severe  EF on 2D Echo 10/22/2012 65-70%  valve area of 1.2cm, and peak and mean gradients of 45 and 68mmHg  . Bronchospasm 12/16/2015  . Chalazion of right upper eyelid early June 2016   treating with cream, Rx by Dr. Merlyn Albert    . CHF (congestive heart failure) ([place])   . Congestive heart failure ([place]) 03/04/2010   Qualifier: Diagnosis of  By: Tamala Julian CMA, Claiborne Billings    . Coronary artery disease   . Dementia ([place])   . Diabetes mellitus without complication ([place]) 0/17/7939  . Diabetes mellitus, type 2 ([place])   . Dyspnea    chronic  . Easy fatigability   . Gastric mass   . Gastric ulcer   . GERD (gastroesophageal reflux disease)   . Heart murmur   . Hernia of unspecified site of abdominal cavity without mention of obstruction or gangrene    hiatal  . Hyperlipidemia   . Hypertension   . Hypokalemia 11/23/2016  . Normal cardiac stress test 12/2013   normal stress nuclear study  . Osteoarthritis   . Pain due to onychomycosis of toenails of both feet 06/04/2019  . Thyroid mass     Past Surgical History:  Procedure Laterality Date  . AORTIC VALVE SURGERY    . CATARACT EXTRACTION Bilateral   . CORONARY ARTERY BYPASS GRAFT     x4 in 2003  . internal hemorrhoidectomy    .  KNEE SURGERY Right   . LEFT AND RIGHT HEART CATHETERIZATION WITH CORONARY/GRAFT ANGIOGRAM N/A 03/24/2015   Procedure: LEFT AND RIGHT HEART CATHETERIZATION WITH Beatrix Fetters;  Surgeon: Burnell Blanks, MD;  Location: [facility] CATH LAB;  Service: Cardiovascular;  Laterality: N/A;  . TEE WITHOUT CARDIOVERSION N/A 05/26/2015   Procedure: TRANSESOPHAGEAL ECHOCARDIOGRAM (TEE);  Surgeon: Burnell Blanks, MD;  Location: [facility];  Service: Open Heart Surgery;  Laterality: N/A;  . TRANSCATHETER AORTIC VALVE REPLACEMENT, TRANSFEMORAL N/A 05/26/2015   Procedure: TRANSCATHETER AORTIC VALVE REPLACEMENT, TRANSFEMORAL;  Surgeon:  Burnell Blanks, MD;  Location: [facility];  Service: Open Heart Surgery;  Laterality: N/A;  . TUBAL LIGATION    . uterine polypectomy      Social History:  reports that she has never smoked. She has never used smokeless tobacco. She reports that she does not drink alcohol or use drugs.  Allergies:  Allergies  Allergen Reactions  . Tape Other (See Comments)    SKIN IS VERY THIN AND WILL TEAR EASILY!!    Medications Prior to Admission  Medication Sig Dispense Refill  . acetaminophen (TYLENOL) 500 MG tablet Take 1,000 mg by mouth 2 (two) times daily. for pain. Do not exceed 4gm of Tylenol in 24 hours    . albuterol (VENTOLIN HFA) 108 (90 Base) MCG/ACT inhaler Inhale 1 puff into the lungs every 6 (six) hours as needed for wheezing or shortness of breath.    . ALPRAZolam (XANAX) 0.5 MG tablet Take 0.125-0.5 mg by mouth See admin instructions. Take 1/4 tablet (0.125 mg) by mouth every morning and 1 tablet (0.5 mg) at night, may also take 1/4 tablet (0.125 mg) with lunch as needed for anxiety.    Marland Kitchen atorvastatin (LIPITOR) 80 MG tablet Take 80 mg by mouth daily with lunch.     . budesonide-formoterol (SYMBICORT) 80-4.5 MCG/ACT inhaler Inhale 2 puffs into the lungs 2 (two) times daily as needed (shortness of breath/wheezing).    . cetirizine (ZYRTEC) 10 MG tablet Take 10 mg by mouth daily as needed for allergies or rhinitis.    Marland Kitchen clopidogrel (PLAVIX) 75 MG tablet Take 1 tablet (75 mg total) by mouth daily with breakfast. 30 tablet 11  . donepezil (ARICEPT) 10 MG tablet Take 10 mg by mouth at bedtime.    Marland Kitchen esomeprazole (NEXIUM) 20 MG capsule Take 20 mg by mouth daily.     . furosemide (LASIX) 20 MG tablet TAKE 2 TABLETS BY MOUTH EVERY DAY (Patient taking differently: Take 20 mg by mouth 2 (two) times daily with breakfast and lunch. ) 180 tablet 1  . Homeopathic Products (THERAWORX RELIEF EX) Apply 1 application topically 2 (two) times daily as needed (pain).    . hydrALAZINE (APRESOLINE) 50 MG  tablet Take 1 tablet (50 mg total) by mouth every 8 (eight) hours. (Patient taking differently: Take 50 mg by mouth every 8 (eight) hours. For blood pressure) 90 tablet 11  . insulin detemir (LEVEMIR) 100 UNIT/ML injection Inject 0.1 mLs (10 Units total) into the skin at bedtime. (Patient taking differently: Inject 12 Units into the skin at bedtime. ) 10 mL 11  . ipratropium (ATROVENT) 0.03 % nasal spray Place 1 spray into both nostrils daily as needed for rhinitis (sneezing).     . Linaclotide (LINZESS) 145 MCG CAPS capsule Take 145 mcg by mouth daily as needed (for constipation).    Marland Kitchen lisinopril (PRINIVIL,ZESTRIL) 10 MG tablet Take 1 tablet (10 mg total) by mouth daily. 30 tablet 11  . loperamide (  IMODIUM) 2 MG capsule Take 2 mg by mouth 2 (two) times daily as needed for diarrhea or loose stools.     . Memantine HCl ER (NAMENDA XR) 28 MG CP24 Take 28 mg by mouth daily. for dementia    . metFORMIN (GLUCOPHAGE-XR) 500 MG 24 hr tablet Take 500 mg by mouth 2 (two) times daily with breakfast and lunch. To control blood sugar. Do not crush.    . Multiple Vitamin (MULTIVITAMIN WITH MINERALS) TABS tablet Take 1 tablet by mouth daily.    . Naphazoline HCl (CLEAR EYES OP) Place 1 drop into both eyes daily as needed (dry eyes).    . nebivolol (BYSTOLIC) 10 MG tablet Take 10 mg by mouth daily. for blood pressure    . NYSTATIN powder Apply 1 application topically 2 (two) times daily as needed for for breast care.    . potassium chloride SA (K-DUR,KLOR-CON) 20 MEQ tablet Take 20 mEq by mouth 2 (two) times daily with breakfast and lunch.       Prior to Admission medications   Medication Sig Start Date End Date Taking? Authorizing Provider  acetaminophen (TYLENOL) 500 MG tablet Take 1,000 mg by mouth 2 (two) times daily. for pain. Do not exceed 4gm of Tylenol in 24 hours   Yes [provider]  albuterol (VENTOLIN HFA) 108 (90 Base) MCG/ACT inhaler Inhale 1 puff into the lungs every 6 (six) hours as  needed for wheezing or shortness of breath.   Yes [provider]  ALPRAZolam (XANAX) 0.5 MG tablet Take 0.125-0.5 mg by mouth See admin instructions. Take 1/4 tablet (0.125 mg) by mouth every morning and 1 tablet (0.5 mg) at night, may also take 1/4 tablet (0.125 mg) with lunch as needed for anxiety. 02/11/20  Yes [provider]  atorvastatin (LIPITOR) 80 MG tablet Take 80 mg by mouth daily with lunch.    Yes [provider]  budesonide-formoterol (SYMBICORT) 80-4.5 MCG/ACT inhaler Inhale 2 puffs into the lungs 2 (two) times daily as needed (shortness of breath/wheezing).   Yes [provider]  cetirizine (ZYRTEC) 10 MG tablet Take 10 mg by mouth daily as needed for allergies or rhinitis.   Yes [provider]  clopidogrel (PLAVIX) 75 MG tablet Take 1 tablet (75 mg total) by mouth daily with breakfast. 06/03/15  Yes Barrett, Rhonda G, PA-C  donepezil (ARICEPT) 10 MG tablet Take 10 mg by mouth at bedtime. 02/07/20  Yes [provider]  esomeprazole (NEXIUM) 20 MG capsule Take 20 mg by mouth daily.    Yes [provider]  furosemide (LASIX) 20 MG tablet TAKE 2 TABLETS BY MOUTH EVERY DAY Patient taking differently: Take 20 mg by mouth 2 (two) times daily with breakfast and lunch.  02/20/20  Yes Hilty, Nadean Corwin, MD  Homeopathic Products ([place]) Apply 1 application topically 2 (two) times daily as needed (pain).   Yes [provider]  hydrALAZINE (APRESOLINE) 50 MG tablet Take 1 tablet (50 mg total) by mouth every 8 (eight) hours. Patient taking differently: Take 50 mg by mouth every 8 (eight) hours. For blood pressure 06/03/15  Yes Barrett, Rhonda G, PA-C  insulin detemir (LEVEMIR) 100 UNIT/ML injection Inject 0.1 mLs (10 Units total) into the skin at bedtime. Patient taking differently: Inject 12 Units into the skin at bedtime.  06/28/18  Yes Isaac Bliss, Rayford Halsted, MD  ipratropium (ATROVENT) 0.03 % nasal spray Place 1  spray into both nostrils daily as needed for rhinitis (sneezing).  11/04/19  Yes [provider]  Linaclotide (LINZESS) 145 MCG CAPS capsule Take 145 mcg by mouth daily as needed (for constipation).   Yes [provider]  lisinopril (PRINIVIL,ZESTRIL) 10 MG tablet Take 1 tablet (10 mg total) by mouth daily. 06/03/15  Yes Barrett, Evelene Croon, PA-C  loperamide (IMODIUM) 2 MG capsule Take 2 mg by mouth 2 (two) times daily as needed for diarrhea or loose stools.    Yes [provider]  Memantine HCl ER (NAMENDA XR) 28 MG CP24 Take 28 mg by mouth daily. for dementia   Yes [provider]  metFORMIN (GLUCOPHAGE-XR) 500 MG 24 hr tablet Take 500 mg by mouth 2 (two) times daily with breakfast and lunch. To control blood sugar. Do not crush. 03/16/15  Yes [provider]  Multiple Vitamin (MULTIVITAMIN WITH MINERALS) TABS tablet Take 1 tablet by mouth daily.   Yes [provider]  Naphazoline HCl (CLEAR EYES OP) Place 1 drop into both eyes daily as needed (dry eyes).   Yes [provider]  nebivolol (BYSTOLIC) 10 MG tablet Take 10 mg by mouth daily. for blood pressure   Yes [provider]  NYSTATIN powder Apply 1 application topically 2 (two) times daily as needed for for breast care. 11/15/16  Yes [provider]  potassium chloride SA (K-DUR,KLOR-CON) 20 MEQ tablet Take 20 mEq by mouth 2 (two) times daily with breakfast and lunch.    Yes [provider]    Blood pressure (!) 135/32, pulse 68, temperature 98 F (36.7 C), temperature source Temporal, resp. rate (!) 21, height 5\' 2"  (1.575 m), SpO2 100 %. Physical Exam: General: frail black female who is laying in bed in NAD HEENT: head is normocephalic, atraumatic.  Sclera are noninjected.  PERRL.  Ears and nose without any masses or lesions.  Mouth is pink and moist. Edentate Heart: regular, rate, and rhythm.  Normal s1,s2. Murmur present.  Distal pulses intact Lungs:  CTAB, no wheezes, rhonchi, or rales noted.  Respiratory effort nonlabored Abd: soft, NT/ND, +BS, no masses, hernias, or organomegaly MS: 1+ edema BLE/BUE, calves soft and nontender Skin: warm and dry with no masses, lesions, or rashes Psych: A&Ox3 Neuro: cranial nerves grossly intact, moving all 4 extremities, normal speech  Results for orders placed or performed during the hospital encounter of 02/22/20 (from the past 48 hour(s))  Glucose, capillary     Status: Abnormal   Collection Time: 02/22/20  6:58 PM  Result Value Ref Range   Glucose-Capillary 136 (H) 70 - 99 mg/dL    Comment: Glucose reference range applies only to samples taken after fasting for at least 8 hours.  Hemoglobin and hematocrit, blood     Status: Abnormal   Collection Time: 02/22/20  9:39 PM  Result Value Ref Range   Hemoglobin 8.7 (L) 12.0 - 15.0 g/dL   HCT 25.2 (L) 36.0 - 46.0 %    Comment: Performed at [facility], [place] [address], [place], [place] [address]  Hemoglobin A1c     Status: Abnormal   Collection Time: 02/22/20  9:39 PM  Result Value Ref Range   Hgb A1c MFr Bld 6.1 (H) 4.8 - 5.6 %    Comment: (NOTE) Pre diabetes:          5.7%-6.4% Diabetes:              >6.4% Glycemic control for   <7.0% adults with diabetes    Mean Plasma Glucose 128.37 mg/dL  Comment: Performed at [facility], [place] [address], [place], [place] [address]  Culture, Urine     Status: Abnormal   Collection Time: 02/23/20 12:12 AM   Specimen: Urine, Catheterized  Result Value Ref Range   Specimen Description URINE, CATHETERIZED    Special Requests      NONE Performed at [facility] [address], [place], [place] [address]    Culture MULTIPLE SPECIES PRESENT, SUGGEST RECOLLECTION (A)    Report Status 02/24/2020 FINAL   Type and screen [place]     Status: None (Preliminary result)   Collection Time: 02/23/20  4:22 AM  Result Value Ref Range   ABO/RH(D) O NEG    Antibody Screen  NEG    Sample Expiration      02/26/2020,2359 Performed at [facility], [place] [address], [place], [place] [address]    Unit Number [id number]    Blood Component Type RED CELLS,LR    Unit division 00    Status of Unit ALLOCATED    Transfusion Status OK TO TRANSFUSE    Crossmatch Result Compatible    Unit Number [id number]    Blood Component Type RED CELLS,LR    Unit division 00    Status of Unit ALLOCATED    Transfusion Status OK TO TRANSFUSE    Crossmatch Result Compatible   Prepare RBC (crossmatch)     Status: None   Collection Time: 02/23/20  4:22 AM  Result Value Ref Range   Order Confirmation      ORDER PROCESSED BY BLOOD BANK Performed at [facility], [place] [address], [place], [place] [address]   CBC     Status: Abnormal   Collection Time: 02/23/20  4:25 AM  Result Value Ref Range   WBC 9.6 4.0 - 10.5 K/uL   RBC 2.61 (L) 3.87 - 5.11 MIL/uL   Hemoglobin 7.8 (L) 12.0 - 15.0 g/dL   HCT 22.5 (L) 36.0 - 46.0 %   MCV 86.2 80.0 - 100.0 fL   MCH 29.9 26.0 - 34.0 pg   MCHC 34.7 30.0 - 36.0 g/dL   RDW 14.6 11.5 - 15.5 %   Platelets 167 150 - 400 K/uL   nRBC 0.0 0.0 - 0.2 %    Comment: Performed at [facility], [place] [address], [place],  [address]  Comprehensive metabolic panel     Status: Abnormal   Collection Time: 02/23/20  4:25 AM  Result Value Ref Range   Sodium 147 (H) 135 - 145 mmol/L   Potassium 3.9 3.5 - 5.1 mmol/L   Chloride 121 (H) 98 - 111 mmol/L   CO2 19 (L) 22 - 32 mmol/L   Glucose, Bld 133 (H) 70 - 99 mg/dL    Comment: Glucose reference range applies only to samples taken after fasting for at least 8 hours.   BUN 50 (H) 8 - 23 mg/dL   Creatinine, Ser 1.33 (H) 0.44 - 1.00 mg/dL   Calcium 9.4 8.9 - 10.3 mg/dL   Total Protein 5.0 (L) 6.5 - 8.1 g/dL   Albumin 2.7 (L) 3.5 - 5.0 g/dL   AST 22 15 - 41 U/L   ALT 17 0 - 44 U/L   Alkaline Phosphatase 46 38 - 126 U/L   Total Bilirubin 0.7 0.3 - 1.2 mg/dL   GFR calc non Af Amer 35  (L) >60 mL/min   GFR calc Af Amer 41 (L) >60 mL/min   Anion  gap 7 5 - 15    Comment: Performed at [facility], [place] [address], [place], [place] [address]  Magnesium     Status: None   Collection Time: 02/23/20  4:25 AM  Result Value Ref Range   Magnesium 1.7 1.7 - 2.4 mg/dL    Comment: Performed at [facility] [address], [place], [place] [address]  Glucose, capillary     Status: Abnormal   Collection Time: 02/23/20  6:48 AM  Result Value Ref Range   Glucose-Capillary 124 (H) 70 - 99 mg/dL    Comment: Glucose reference range applies only to samples taken after fasting for at least 8 hours.  CBC     Status: Abnormal   Collection Time: 02/23/20 10:39 AM  Result Value Ref Range   WBC 8.0 4.0 - 10.5 K/uL   RBC 2.56 (L) 3.87 - 5.11 MIL/uL   Hemoglobin 7.6 (L) 12.0 - 15.0 g/dL   HCT 22.6 (L) 36.0 - 46.0 %   MCV 88.3 80.0 - 100.0 fL   MCH 29.7 26.0 - 34.0 pg   MCHC 33.6 30.0 - 36.0 g/dL   RDW 15.0 11.5 - 15.5 %   Platelets 178 150 - 400 K/uL   nRBC 0.0 0.0 - 0.2 %    Comment: Performed at [facility], [place] [address] [place], [place] [address]  Glucose, capillary     Status: Abnormal   Collection Time: 02/23/20 11:31 AM  Result Value Ref Range   Glucose-Capillary 130 (H) 70 - 99 mg/dL    Comment: Glucose reference range applies only to samples taken after fasting for at least 8 hours.  Iron and TIBC     Status: Abnormal   Collection Time: 02/23/20 12:05 PM  Result Value Ref Range   Iron 38 28 - 170 ug/dL   TIBC 220 (L) 250 - 450 ug/dL   Saturation Ratios 17 10.4 - 31.8 %   UIBC 182 ug/dL    Comment: Performed at [facility], [place] [address], [place], [place] [address]  Ferritin     Status: None   Collection Time: 02/23/20 12:05 PM  Result Value Ref Range   Ferritin 24 11 - 307 ng/mL    Comment: Performed at [facility], [place] [address], [place], [place] [address]  Glucose, capillary     Status: Abnormal   Collection Time: 02/23/20  4:42 PM   Result Value Ref Range   Glucose-Capillary 131 (H) 70 - 99 mg/dL    Comment: Glucose reference range applies only to samples taken after fasting for at least 8 hours.  CBC     Status: Abnormal   Collection Time: 02/23/20  6:36 PM  Result Value Ref Range   WBC 7.9 4.0 - 10.5 K/uL   RBC 2.47 (L) 3.87 - 5.11 MIL/uL   Hemoglobin 7.3 (L) 12.0 - 15.0 g/dL   HCT 21.8 (L) 36.0 - 46.0 %   MCV 88.3 80.0 - 100.0 fL   MCH 29.6 26.0 - 34.0 pg   MCHC 33.5 30.0 - 36.0 g/dL   RDW 15.2 11.5 - 15.5 %   Platelets 166 150 - 400 K/uL   nRBC 0.0 0.0 - 0.2 %    Comment: Performed at [facility], [place] [address] [place], [place] [address]  Glucose, capillary     Status: Abnormal   Collection Time: 02/23/20 11:38 PM  Result Value Ref Range   Glucose-Capillary 154 (H)  70 - 99 mg/dL    Comment: Glucose reference range applies only to samples taken after fasting for at least 8 hours.  CBC     Status: Abnormal   Collection Time: 02/23/20 11:46 PM  Result Value Ref Range   WBC 7.2 4.0 - 10.5 K/uL   RBC 2.50 (L) 3.87 - 5.11 MIL/uL   Hemoglobin 7.3 (L) 12.0 - 15.0 g/dL   HCT 22.1 (L) 36.0 - 46.0 %   MCV 88.4 80.0 - 100.0 fL   MCH 29.2 26.0 - 34.0 pg   MCHC 33.0 30.0 - 36.0 g/dL   RDW 15.2 11.5 - 15.5 %   Platelets 170 150 - 400 K/uL   nRBC 0.0 0.0 - 0.2 %    Comment: Performed at [facility], [place] [address], [place], [place] [address]  CBC     Status: Abnormal   Collection Time: 02/24/20  5:38 AM  Result Value Ref Range   WBC 8.2 4.0 - 10.5 K/uL   RBC 2.59 (L) 3.87 - 5.11 MIL/uL   Hemoglobin 7.8 (L) 12.0 - 15.0 g/dL   HCT 23.5 (L) 36.0 - 46.0 %   MCV 90.7 80.0 - 100.0 fL   MCH 30.1 26.0 - 34.0 pg   MCHC 33.2 30.0 - 36.0 g/dL   RDW 15.7 (H) 11.5 - 15.5 %   Platelets 185 150 - 400 K/uL   nRBC 0.0 0.0 - 0.2 %    Comment: Performed at [facility], [place] [address], [place], [place] [address]  Comprehensive metabolic panel     Status: Abnormal   Collection Time: 02/24/20  5:38 AM   Result Value Ref Range   Sodium 150 (H) 135 - 145 mmol/L   Potassium 3.8 3.5 - 5.1 mmol/L   Chloride 118 (H) 98 - 111 mmol/L   CO2 23 22 - 32 mmol/L   Glucose, Bld 159 (H) 70 - 99 mg/dL    Comment: Glucose reference range applies only to samples taken after fasting for at least 8 hours.   BUN 30 (H) 8 - 23 mg/dL   Creatinine, Ser 1.18 (H) 0.44 - 1.00 mg/dL   Calcium 9.9 8.9 - 10.3 mg/dL   Total Protein 5.0 (L) 6.5 - 8.1 g/dL   Albumin 2.5 (L) 3.5 - 5.0 g/dL   AST 20 15 - 41 U/L   ALT 16 0 - 44 U/L   Alkaline Phosphatase 43 38 - 126 U/L   Total Bilirubin 0.2 (L) 0.3 - 1.2 mg/dL   GFR calc non Af Amer 41 (L) >60 mL/min   GFR calc Af Amer 47 (L) >60 mL/min   Anion gap 9 5 - 15    Comment: Performed at [facility] [address] [place], [place] [address]  Glucose, capillary     Status: Abnormal   Collection Time: 02/24/20  7:11 AM  Result Value Ref Range   Glucose-Capillary 134 (H) 70 - 99 mg/dL    Comment: Glucose reference range applies only to samples taken after fasting for at least 8 hours.  Glucose, capillary     Status: Abnormal   Collection Time: 02/24/20 10:14 AM  Result Value Ref Range   Glucose-Capillary 122 (H) 70 - 99 mg/dL    Comment: Glucose reference range applies only to samples taken after fasting for at least 8 hours.  Glucose, capillary     Status: Abnormal   Collection Time: 02/24/20 11:47 AM  Result Value Ref Range  Glucose-Capillary 136 (H) 70 - 99 mg/dL    Comment: Glucose reference range applies only to samples taken after fasting for at least 8 hours.   Comment 1 Notify RN    Comment 2 Document in Chart    No results found.  Anti-infectives (From admission, onward)   Start     Dose/Rate Route Frequency Ordered Stop   02/23/20 1600  cefTRIAXone (ROCEPHIN) 1 g in sodium chloride 0.9 % 100 mL IVPB     1 g 200 mL/hr over 30 Minutes Intravenous Every 24 hours 02/22/20 2029 02/25/20 1559       Assessment/Plan CAD h/o CABG 2003 H/o TAVR  on plavix (last does possibly as recent as 2/26) Diastolic CHF HTN DM Dementia AKI vs progressive CKD UTI  Upper GI bleed with acute blood loss anemia Gastric fundus mass - Patient with acute blood loss anemia secondary to gastric fundus mass, which has been present >20 years. No active bleeding noted on EGD today. Plavix is currently on hold and patient received 2 units PRBCs 3/20, hemoglobin stable since then. Could consider surgical resection, although the patient is 34 with several comorbidities and higher risk with surgery/ general anesthesia including cardiac event, prolonged hospitalization, blood clot, and death. Katherine [name] tells me that her mom would never want to be placed in a SNF, but after an abdominal surgery she would likely require SNF for rehabilitation. If she has no further bleeding I think it would be reasonable to watch her without surgery. Will ask cardiology to see her and comment whether it would be possible to hold plavix indefinitely. Will continue to follow.   ID - rocephin 3/21>>3/23 for UTI VTE - SCDs FEN - IVF, CLD Foley - in place Follow up - TBD  Wellington [place], [facility] Surgery 02/24/2020, 12:02 PM Please see Amion for pager number during day hours 7:00am-4:30pm

## 2020-02-24 NOTE — Transfer of Care (Signed)
Immediate Anesthesia Transfer of Care Note  Patient: Katherine [name]  Procedure(s) Performed: ESOPHAGOGASTRODUODENOSCOPY (EGD) WITH PROPOFOL (N/A )  Patient Location: Endoscopy Unit  Anesthesia Type:MAC  Level of Consciousness: drowsy  Airway & Oxygen Therapy: Patient Spontanous Breathing and Patient connected to nasal cannula oxygen  Post-op Assessment: Report given to RN and Post -op Vital signs reviewed and stable  Post vital signs: Reviewed and stable  Last Vitals:  Vitals Value Taken Time  BP 105/30 02/24/20 1100  Temp    Pulse 67 02/24/20 1101  Resp 21 02/24/20 1101  SpO2 100 % 02/24/20 1101  Vitals shown include unvalidated device data.  Last Pain:  Vitals:   02/24/20 1004  TempSrc: Temporal  PainSc:       Patients Stated Pain Goal: 0 (15/61/53 7943)  Complications: No apparent anesthesia complications

## 2020-02-24 NOTE — Interval H&P Note (Signed)
History and Physical Interval Note:  02/24/2020 10:09 AM  Katherine [name]  has presented today for surgery, with the diagnosis of Gi Bleed.  The various methods of treatment have been discussed with the patient and family. After consideration of risks, benefits and other options for treatment, the patient has consented to  Procedure(s): ESOPHAGOGASTRODUODENOSCOPY (EGD) WITH PROPOFOL (N/A) as a surgical intervention.  The patient's history has been reviewed, patient examined, no change in status, stable for surgery.  I have reviewed the patient's chart and labs.  Questions were answered to the patient's satisfaction.     [name]

## 2020-02-24 NOTE — Anesthesia Procedure Notes (Signed)
Procedure Name: MAC Date/Time: 02/24/2020 10:51 AM Performed by: Imagene Riches, CRNA Pre-anesthesia Checklist: Patient identified, Emergency Drugs available, Suction available and Patient being monitored Patient Re-evaluated:Patient Re-evaluated prior to induction Oxygen Delivery Method: Nasal cannula

## 2020-02-24 NOTE — Op Note (Signed)
[facility] Patient Name: Katherine [name] Procedure Date : 02/24/2020 MRN: [id number] Attending MD: Docia Chuck. Henrene Pastor , MD Date of Birth: January 05, 1928 CSN: [id number] Age: 86 Admit Type: Inpatient Procedure:                Upper GI endoscopy Indications:              Melena, acute blood loss anemia Providers:                Docia Chuck. Henrene Pastor, MD, Angus Seller, Janie Billups,                            Ivor Messier Jackquline Berlin CRNA Referring MD:             [facility] hospitalist Medicines:                Monitored Anesthesia Care Complications:            No immediate complications. Estimated Blood Loss:     Estimated blood loss: none. Procedure:                Pre-Anesthesia Assessment:                           - Prior to the procedure, a History and Physical                            was performed, and patient medications and                            allergies were reviewed. The patient's tolerance of                            previous anesthesia was also reviewed. The risks                            and benefits of the procedure and the sedation                            options and risks were discussed with the patient.                            All questions were answered, and informed consent                            was obtained. Prior Anticoagulants: The patient has                            taken no previous anticoagulant or antiplatelet                            agents. ASA Grade Assessment: III - A patient with                            severe systemic disease. After reviewing the risks  and benefits, the patient was deemed in                            satisfactory condition to undergo the procedure.                           After obtaining informed consent, the endoscope was                            passed under direct vision. Throughout the                            procedure, the patient's blood pressure, pulse, and                        oxygen saturations were monitored continuously. The                            GIF-H190 ([id number]) Olympus gastroscope was                            introduced through the mouth, and advanced to the                            second part of duodenum. The upper GI endoscopy was                            accomplished without difficulty. The patient                            tolerated the procedure well. Scope In: Scope Out: Findings:      One benign-appearing, intrinsic moderate stenosis was found. This       stenosis measured 1.5 cm (inner diameter). The stenosis was traversed.      A large (4 to 5 cm), submucosal mass with with surface ulceration in the       gastric fundus. No active bleeding at present.. There were several tiny       benign fundic gland type polyps present. As well small hiatal hernia.      The examined duodenum was normal.      The cardia and gastric fundus were normal on retroflexion. Impression:               1. Submucosal mass measuring approximately 5 cm                            with surface ulceration. This is the cause for                            recent GI bleed. 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                           2. Large caliber distal esophageal ring                           3. Small incidental gastric polyps  4. Small hiatal hernia. Otherwise normal EGD. Recommendation:           1. Holding Plavix. Primary service to assess                            risk-benefit ratio of ongoing Plavix therapy                           2. Twice daily PPI indefinitely                           3. Surgical consultation. This is definitive                            therapy for this problem. Consultation called                           4. Liquid diet.                           5. Monitor for evidence of rebleeding. Procedure Code(s):        --- Professional ---                           [phone], Esophagogastroduodenoscopy,  flexible,                            transoral; diagnostic, including collection of                            specimen(s) by brushing or washing, when performed                            (separate procedure) Diagnosis Code(s):        --- Professional ---                           K22.2, Esophageal obstruction                           D49.0, Neoplasm of unspecified behavior of                            digestive system                           K92.1, Melena (includes Hematochezia) CPT copyright 2019 [employer]. All rights reserved. The codes documented in this report are preliminary and upon coder review may  be revised to meet current compliance requirements. Docia Chuck. Henrene Pastor, MD 02/24/2020 11:12:53 AM This report has been signed electronically. Number of Addenda: 0

## 2020-02-24 NOTE — Progress Notes (Signed)
Pt has been NPO since midnight.   Eleanora Neighbor, RN

## 2020-02-24 NOTE — Anesthesia Postprocedure Evaluation (Signed)
Anesthesia Post Note  Patient: Katherine [name]  Procedure(s) Performed: ESOPHAGOGASTRODUODENOSCOPY (EGD) WITH PROPOFOL (N/A )     Patient location during evaluation: Endoscopy Anesthesia Type: MAC Level of consciousness: awake and alert Pain management: pain level controlled Vital Signs Assessment: post-procedure vital signs reviewed and stable Respiratory status: spontaneous breathing, nonlabored ventilation and respiratory function stable Cardiovascular status: blood pressure returned to baseline and stable Postop Assessment: no apparent nausea or vomiting Anesthetic complications: no    Last Vitals:  Vitals:   02/24/20 1120 02/24/20 1130  BP: (!) 131/34 (!) 135/32  Pulse: 66 68  Resp: (!) 22 (!) 21  Temp:    SpO2: 100% 100%    Last Pain:  Vitals:   02/24/20 1130  TempSrc:   PainSc: 0-No pain                 Lynda Rainwater

## 2020-02-24 NOTE — Anesthesia Preprocedure Evaluation (Addendum)
Anesthesia Evaluation  Patient identified by MRN, date of birth, ID band Patient awake    Reviewed: Allergy & Precautions, NPO status , Patient's Chart, lab work & pertinent test results  Airway Mallampati: III  TM Distance: >3 FB Neck ROM: Full    Dental  (+) Edentulous Upper, Edentulous Lower   Pulmonary neg pulmonary ROS,    Pulmonary exam normal breath sounds clear to auscultation       Cardiovascular hypertension, + CAD, + CABG and +CHF  Normal cardiovascular exam+ Valvular Problems/Murmurs (s/p TAVR 2016) AS  Rhythm:Regular Rate:Normal  TTE 2019 - Left ventricle: The cavity size was normal. Systolic function was vigorous. The estimated ejection fraction was in the range of 65%to 70%. Wall motion was normal; there were no regional wall motion abnormalities. Doppler parameters are consistent with abnormal left ventricular relaxation (grade 1 diastolic dysfunction). Doppler parameters are consistent with highventricular filling pressure. Mild to moderate posterior wall and severe septal hypertrophy.  - Aortic valve: History of TAVR. Leaflets poorly visualized. There was trivial regurgitation. Peak velocity (S): 254 cm/s. Meangradient (S): 12 mm Hg. Valve area (VTI): 1.66 cm^2. Valve area (Vmax): 1.5 cm^2. Valve area (Vmean): 1.82 cm^2.  - Mitral valve: Moderately thickened annulus. There was mild regurgitation.  - Tricuspid valve: There was mild regurgitation.  - Pulmonary arteries: PA peak pressure: 43 mm Hg (S).   Stress Test 2015 Impression Exercise Capacity:  Lexiscan with no exercise. BP Response:  Normal blood pressure response. Clinical Symptoms:  No significant symptoms noted. ECG Impression:  No significant ECG changes with Lexiscan. Comparison with Prior Nuclear Study: No significant change from previous study   Neuro/Psych PSYCHIATRIC DISORDERS Anxiety Dementia negative neurological ROS     GI/Hepatic Neg liver  ROS, GERD  ,  Endo/Other  negative endocrine ROSdiabetes, Type 2  Renal/GU negative Renal ROS  negative genitourinary   Musculoskeletal negative musculoskeletal ROS (+)   Abdominal   Peds  Hematology  (+) Blood dyscrasia (Hgb 7.8), anemia ,   Anesthesia Other Findings   Reproductive/Obstetrics                            Anesthesia Physical Anesthesia Plan  ASA: III  Anesthesia Plan: MAC   Post-op Pain Management:    Induction: Intravenous  PONV Risk Score and Plan: 2 and Propofol infusion and Treatment may vary due to age or medical condition  Airway Management Planned: Natural Airway  Additional Equipment:   Intra-op Plan:   Post-operative Plan:   Informed Consent: I have reviewed the patients History and Physical, chart, labs and discussed the procedure including the risks, benefits and alternatives for the proposed anesthesia with the patient or authorized representative who has indicated his/her understanding and acceptance.     Dental advisory given  Plan Discussed with: CRNA  Anesthesia Plan Comments:         Anesthesia Quick Evaluation

## 2020-02-24 NOTE — Consult Note (Addendum)
Cardiology Consultation:   Patient ID: KALEEYAH CUFFIE MRN: [id number]; DOB: May 05, 1928  Admit date: 02/22/2020 Date of Consult: 02/24/2020  Primary Care Provider: Celene Squibb, MD Primary Cardiologist: [name], MD  Primary Electrophysiologist:  None    Patient Profile:   CHARRISSE [name] is a 86 y.o. female with a hx of CAD s/p CABG x 4 (LIMA-LAD, SVG-D, SVG-LCx, and SVG-PDA) 2003, morbid obesity, HTN, HLD, DM II, and h/o severe AS s/p TAVRon 05/26/2015 who is being seen today for the evaluation of antiplatelet therapy in the setting GI bleeding at the request of Dr. Earnest Conroy.  History of Present Illness:   Ms. Stryker is a 86 yo female with PMH noted above. She is followed by Dr. Debara Pickett as an outpatient. Underwent cardiac cath 03/24/15 showing diffuse 20% disease in the left main, diffuse 60% mid LAD lesion followed by subtotal occlusion, patent LIMA to mid and distal LAD, 90% mid left circumflex lesion, 100% mid RCA occlusion, patent SVG to left circumflex, patent SVG to PDA and patent SVG to diagonal. Underwent TAVR by Dr. Cyndia Bent with Oletta Lamas Sapien 3 THV size 23 mm bioprosthetic valve. Echo 2019 showed EF 65 to 70%, grade 1 DD, mild to moderate posterior wall and the severe septal hypertrophy, leaflet of the aortic valve was poorly visualized and that there was trivial regurgitation, mild MR, PA peak pressure 43 mmHg.  He was seen for a virtual visit on 02/13/20 with the help of her daughter due to severe dementia. No symptoms reported. She was continued on her home medications without significant changes.   In talking with family she is totally dependent on them for all her ADLs, at baseline knows her name and family members at times. No chest pain or shortness of breath prior to admission.  She presented to the ED at [facility] on 3/20 with weakness/altered mental status and found to have Hgb of 6.2. Noted to have dark stools for 2 days prior to admission. Also treated  for a UTI Notes indicate possibly having a gastric tumor dating back to 62 though family was unaware of this. She underwent an upper endoscopy with Dr. 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 She has been transfused 2 units PRBCs with improvement in Hgb to 7.5. Seen by general surgery today with plans to treat conservatively unless recurrent bleeding. Her plavix was held on admission.    Past Medical History:  Diagnosis Date  . Acute bronchitis 12/16/2015  . Acute exacerbation of CHF (congestive heart failure) ([place]) 11/20/2016  . Acute metabolic encephalopathy 8/34/1962  . Anemia   . Anxiety   . Aortic valvular stenosis    moderate to severe  EF on 2D Echo 10/22/2012 65-70%  valve area of 1.2cm, and peak and mean gradients of 45 and 13mmHg  . Bronchospasm 12/16/2015  . Chalazion of right upper eyelid early June 2016   treating with cream, Rx by Dr. Merlyn Albert    . CHF (congestive heart failure) ([place])   . Congestive heart failure ([place]) 03/04/2010   Qualifier: Diagnosis of  By: Tamala Julian CMA, Claiborne Billings    . Coronary artery disease   . Dementia ([place])   . Diabetes mellitus without complication ([place]) 2/29/7989  . Diabetes mellitus, type 2 ([place])   . Dyspnea    chronic  . Easy fatigability   . Gastric mass   . Gastric ulcer   . GERD (gastroesophageal reflux disease)   . Heart murmur   .  Hernia of unspecified site of abdominal cavity without mention of obstruction or gangrene    hiatal  . Hyperlipidemia   . Hypertension   . Hypokalemia 11/23/2016  . Normal cardiac stress test 12/2013   normal stress nuclear study  . Osteoarthritis   . Pain due to onychomycosis of toenails of both feet 06/04/2019  . Thyroid mass     Past Surgical History:  Procedure Laterality Date  . AORTIC VALVE SURGERY    . CATARACT EXTRACTION Bilateral   . CORONARY ARTERY BYPASS GRAFT     x4 in 2003  . internal hemorrhoidectomy    . KNEE SURGERY Right   . LEFT AND RIGHT HEART  CATHETERIZATION WITH CORONARY/GRAFT ANGIOGRAM N/A 03/24/2015   Procedure: LEFT AND RIGHT HEART CATHETERIZATION WITH Beatrix Fetters;  Surgeon: Burnell Blanks, MD;  Location[facility] C[facility] LAB;  Service: Cardiovascular;  Laterality: N/A;  . TEE WITHOUT CARDIOVERSION N/A 05/26/2015   Procedure: TRANSESOPHAGEAL ECHOCARDIOGRAM (TEE);  Surgeon: Burnell Blanks, MD;  Location: [facility];  Service: Open Heart Surgery;  Laterality: N/A;  . TRANSCATHETER AORTIC VALVE REPLACEMENT, TRANSFEMORAL N/A 05/26/2015   Procedure: TRANSCATHETER AORTIC VALVE REPLACEMENT, TRANSFEMORAL;  Surgeon: Burnell Blanks, MD;  Location: [facility];  Service: Open Heart Surgery;  Laterality: N/A;  . TUBAL LIGATION    . uterine polypectomy       Home Medications:  Prior to Admission medications   Medication Sig Start Date End Date Taking? Authorizing Provider  acetaminophen (TYLENOL) 500 MG tablet Take 1,000 mg by mouth 2 (two) times daily. for pain. Do not exceed 4gm of Tylenol in 24 hours   Yes [provider]  albuterol (VENTOLIN HFA) 108 (90 Base) MCG/ACT inhaler Inhale 1 puff into the lungs every 6 (six) hours as needed for wheezing or shortness of breath.   Yes [provider]  ALPRAZolam (XANAX) 0.5 MG tablet Take 0.125-0.5 mg by mouth See admin instructions. Take 1/4 tablet (0.125 mg) by mouth every morning and 1 tablet (0.5 mg) at night, may also take 1/4 tablet (0.125 mg) with lunch as needed for anxiety. 02/11/20  Yes [provider]  atorvastatin (LIPITOR) 80 MG tablet Take 80 mg by mouth daily with lunch.    Yes [provider]  budesonide-formoterol (SYMBICORT) 80-4.5 MCG/ACT inhaler Inhale 2 puffs into the lungs 2 (two) times daily as needed (shortness of breath/wheezing).   Yes [provider]  cetirizine (ZYRTEC) 10 MG tablet Take 10 mg by mouth daily as needed for allergies or rhinitis.   Yes [provider]  clopidogrel (PLAVIX) 75 MG tablet Take 1  tablet (75 mg total) by mouth daily with breakfast. 06/03/15  Yes Barrett, Rhonda G, PA-C  donepezil (ARICEPT) 10 MG tablet Take 10 mg by mouth at bedtime. 02/07/20  Yes [provider]  esomeprazole (NEXIUM) 20 MG capsule Take 20 mg by mouth daily.    Yes [provider]  furosemide (LASIX) 20 MG tablet TAKE 2 TABLETS BY MOUTH EVERY DAY Patient taking differently: Take 20 mg by mouth 2 (two) times daily with breakfast and lunch.  02/20/20  Yes Hilty, Nadean Corwin, MD  Homeopathic Products ([place]) Apply 1 application topically 2 (two) times daily as needed (pain).   Yes [provider]  hydrALAZINE (APRESOLINE) 50 MG tablet Take 1 tablet (50 mg total) by mouth every 8 (eight) hours. Patient taking differently: Take 50 mg by mouth every 8 (eight) hours. For blood pressure 06/03/15  Yes Barrett, Evelene Croon,  PA-C  insulin detemir (LEVEMIR) 100 UNIT/ML injection Inject 0.1 mLs (10 Units total) into the skin at bedtime. Patient taking differently: Inject 12 Units into the skin at bedtime.  06/28/18  Yes Isaac Bliss, Rayford Halsted, MD  ipratropium (ATROVENT) 0.03 % nasal spray Place 1 spray into both nostrils daily as needed for rhinitis (sneezing).  11/04/19  Yes [provider]  Linaclotide (LINZESS) 145 MCG CAPS capsule Take 145 mcg by mouth daily as needed (for constipation).   Yes [provider]  lisinopril (PRINIVIL,ZESTRIL) 10 MG tablet Take 1 tablet (10 mg total) by mouth daily. 06/03/15  Yes Barrett, Evelene Croon, PA-C  loperamide (IMODIUM) 2 MG capsule Take 2 mg by mouth 2 (two) times daily as needed for diarrhea or loose stools.    Yes [provider]  Memantine HCl ER (NAMENDA XR) 28 MG CP24 Take 28 mg by mouth daily. for dementia   Yes [provider]  metFORMIN (GLUCOPHAGE-XR) 500 MG 24 hr tablet Take 500 mg by mouth 2 (two) times daily with breakfast and lunch. To control blood sugar. Do not crush. 03/16/15  Yes [provider]  Multiple Vitamin (MULTIVITAMIN WITH MINERALS) TABS tablet Take 1 tablet by mouth daily.   Yes [provider]  Naphazoline HCl (CLEAR EYES OP) Place 1 drop into both eyes daily as needed (dry eyes).   Yes [provider]  nebivolol (BYSTOLIC) 10 MG tablet Take 10 mg by mouth daily. for blood pressure   Yes [provider]  NYSTATIN powder Apply 1 application topically 2 (two) times daily as needed for for breast care. 11/15/16  Yes [provider]  potassium chloride SA (K-DUR,KLOR-CON) 20 MEQ tablet Take 20 mEq by mouth 2 (two) times daily with breakfast and lunch.    Yes [provider]    Inpatient Medications: Scheduled Meds: . sodium chloride   Intravenous Once  . acetaminophen  500 mg Oral BID  . ALPRAZolam  0.5 mg Oral QHS   And  . ALPRAZolam  0.25 mg Oral Daily  . atorvastatin  80 mg Oral Q lunch  . Chlorhexidine Gluconate Cloth  6 each Topical Daily  . donepezil  10 mg Oral QHS  . fluticasone furoate-vilanterol  1 puff Inhalation Daily  . insulin aspart  0-9 Units Subcutaneous TID WC  . memantine  28 mg Oral Daily  . pantoprazole  40 mg Oral BID   Continuous Infusions: . cefTRIAXone (ROCEPHIN)  IV 1 g (02/23/20 1521)  . lactated ringers 75 mL/hr at 02/24/20 0606   PRN Meds: albuterol, lip balm  Allergies:    Allergies  Allergen Reactions  . Tape Other (See Comments)    SKIN IS VERY THIN AND WILL TEAR EASILY!!    Social History:   Social History   Socioeconomic History  . Marital status: Widowed    Spouse name: Not on file  . Number of children: 5  . Years of education: Not on file  . Highest education level: Not on file  Occupational History  . Occupation: Health and safety inspector and CNA  Tobacco Use  . Smoking status: Never Smoker  . Smokeless tobacco: Never Used  Substance and Sexual Activity  . Alcohol use: No  . Drug use: No  . Sexual activity: Not on file  Other Topics Concern  . Not on file   Social History Narrative  . Not on file   Social Determinants of Health   Financial Resource Strain:   . Difficulty of  Paying Living Expenses:   Food Insecurity:   . Worried About Charity fundraiser in the Last Year:   . Arboriculturist in the Last Year:   Transportation Needs:   . Film/video editor (Medical):   Marland Kitchen Lack of Transportation (Non-Medical):   Physical Activity:   . Days of Exercise per Week:   . Minutes of Exercise per Session:   Stress:   . Feeling of Stress :   Social Connections:   . Frequency of Communication with Friends and Family:   . Frequency of Social Gatherings with Friends and Family:   . Attends Religious Services:   . Active Member of Clubs or Organizations:   . Attends Archivist Meetings:   Marland Kitchen Marital Status:   Intimate Partner Violence:   . Fear of Current or Ex-Partner:   . Emotionally Abused:   Marland Kitchen Physically Abused:   . Sexually Abused:     Family History:    Family History  Problem Relation Age of Onset  . Diabetes Mother   . Heart disease Mother   . Stroke Mother   . Cerebral aneurysm Mother   . Stroke Father   . Heart attack Brother   . Colon cancer Neg Hx      ROS:  Please see the history of present illness.   All other ROS reviewed and negative.     Physical Exam/Data:   Vitals:   02/24/20 1100 02/24/20 1110 02/24/20 1120 02/24/20 1130  BP: (!) 105/30 (!) 107/42 (!) 131/34 (!) 135/32  Pulse: 67 66 66 68  Resp: (!) 25 16 (!) 22 (!) 21  Temp: 98 F (36.7 C)     TempSrc: Temporal     SpO2: 100% 100% 100% 100%  Height:        Intake/Output Summary (Last 24 hours) at 02/24/2020 1412 Last data filed at 02/24/2020 1050 Gross per 24 hour  Intake 844.27 ml  Output 1150 ml  Net -305.73 ml   Last 3 Weights 02/13/2020 10/16/2018 06/26/2018  Weight (lbs) 188 lb (No Data) 207 lb  Weight (kg) 85.276 kg (No Data) 93.895 kg     Body mass index is 34.39 kg/m.  General:  Frail, older AAF laying in bed HEENT:  normal Lymph: no adenopathy Neck: no JVD Endocrine:  No thryomegaly Vascular: No carotid bruits Cardiac:  normal S1, S2; RRR; + systolic murmur  Lungs:  clear to auscultation bilaterally, no wheezing, rhonchi or rales  Abd: soft, nontender, no hepatomegaly  Ext: no edema Musculoskeletal:  No deformities, BUE and BLE strength normal and equal Skin: warm and dry  Neuro:  CNs 2-12 intact, no focal abnormalities noted  EKG:  The EKG was personally reviewed and demonstrates:  N/a  Telemetry:  Telemetry was personally reviewed and demonstrates: N/a   Relevant CV Studies:  Echo: 11/19  Study Conclusions   - Left ventricle: The cavity size was normal. Systolic function was  vigorous. The estimated ejection fraction was in the range of 65%  to 70%. Wall motion was normal; there were no regional wall  motion abnormalities. Doppler parameters are consistent with  abnormal left ventricular relaxation (grade 1 diastolic  dysfunction). Doppler parameters are consistent with high  ventricular filling pressure. Mild to moderate posterior wall and  severe septal hypertrophy.  - Aortic valve: History of TAVR. Leaflets poorly visualized. There  was trivial regurgitation. Peak velocity (S): 254 cm/s. Mean  gradient (S): 12 mm Hg. Valve area (VTI): 1.66 cm^2.  Valve area  (Vmax): 1.5 cm^2. Valve area (Vmean): 1.82 cm^2.  - Mitral valve: Moderately thickened annulus. There was mild  regurgitation.  - Tricuspid valve: There was mild regurgitation.  - Pulmonary arteries: PA peak pressure: 43 mm Hg (S).   Laboratory Data:  High Sensitivity Troponin:  No results for input(s): TROPONINIHS in the last 720 hours.   Chemistry Recent Labs  Lab 02/23/20 0425 02/24/20 0538  NA 147* 150*  K 3.9 3.8  CL 121* 118*  CO2 19* 23  GLUCOSE 133* 159*  BUN 50* 30*  CREATININE 1.33* 1.18*  CALCIUM 9.4 9.9  GFRNONAA 35* 41*  GFRAA 41* 47*  ANIONGAP 7 9    Recent Labs  Lab  02/23/20 0425 02/24/20 0538  PROT 5.0* 5.0*  ALBUMIN 2.7* 2.5*  AST 22 20  ALT 17 16  ALKPHOS 46 43  BILITOT 0.7 0.2*   Hematology Recent Labs  Lab 02/23/20 1836 02/23/20 2346 02/24/20 0538  WBC 7.9 7.2 8.2  RBC 2.47* 2.50* 2.59*  HGB 7.3* 7.3* 7.8*  HCT 21.8* 22.1* 23.5*  MCV 88.3 88.4 90.7  MCH 29.6 29.2 30.1  MCHC 33.5 33.0 33.2  RDW 15.2 15.2 15.7*  PLT 166 170 185   BNPNo results for input(s): BNP, PROBNP in the last 168 hours.  DDimer No results for input(s): DDIMER in the last 168 hours.   Radiology/Studies:  No results found. {  Assessment and Plan:   CHARINA FONS is a 86 y.o. female with a hx of CAD s/p CABG x 4 (LIMA-LAD, SVG-D, SVG-LCx, and SVG-PDA) 2003, morbid obesity, HTN, HLD, DM II, and h/o severe AS s/p TAVRon 05/26/2015 who is being seen today for the evaluation of antiplatelet therapy in the setting GI bleeding at the request of Dr. Earnest Conroy.  1. GI bleed: Initially with Hgb of 6.2 s/p 2 units PRBCs with improvement to 8.0. Found to have a gastric mass on upper GI today and seen by surgery with plans to treat conservatively unless recurrent bleeding given her high risk with surgery.   2. CAD s/p CABG/TAVR: CABG was 2003, and TAVR 2016. She was on plavix prior to admission, which was held appropriately on admission. Given her advanced age, GI bleed, and gastric tumor would agree with stopping plavix indefinitely. Seems the likelihood of recurrent bleed would be high with this GI tumor. Suspect she would not tolerate low dose ASA either. Discussed this with the family at bedside.   3. UTI: treated with antibiotics on admission. Further management per primary.   4. Advance dementia: on Aricept and namenda. At baseline she is fully dependent on family for ADLs.    For questions or updates, please contact [place] Please consult [email or web address] for contact info under     Signed, Reino Bellis, NP  02/24/2020 2:12 PM  I have  personally seen and examined this patient. I agree with the assessment and plan as outlined above.  I know ms Horsman from [name] in 2016. She has done well over the past 5 years following TAVR and maintained on Plavix. She is now admitted with anemia. Endoscopy with gastric mass. Cardiology consulted for advice on Plavix.  Labs reviewed by me. No EKG.  My exam:  General: Elderly female, somnolent HEENT: OP clear, mucus membranes moist  SKIN: warm, dry. No rashes. Neuro: No focal deficits Musculoskeletal: Muscle strength 5/5 all ext  Psychiatric: somnolent Neck: No JVD, no carotid bruits, no thyromegaly, no lymphadenopathy.  Lungs:Clear bilaterally, no wheezes, rhonci,  crackles Cardiovascular: Regular rate and rhythm. Systolic murmur.  Abdomen:Soft. Bowel sounds present. Non-tender.  Extremities: No lower extremity edema.  Plan: I agree with stopping her Plavix in the setting of acute GI bleeding and anemia. [name] was 5 years ago. Given age and issues with bleeding, I would not restart the Plavix. Her family would like to discuss placement and end of life issues. Palliative care consult pending. We will be available for questions.   Lauree Chandler 02/24/2020 3:33 PM

## 2020-02-24 NOTE — Progress Notes (Addendum)
PROGRESS NOTE    Katherine [name]  QIW:[id number]  DOB: 1928-08-03  PCP: Celene Squibb, MD Admit date:02/22/2020  86 y.o.femalewith PMHCAD s/p CABG x 4, morbid obesity, HTN, HLD, DM II,and h/o severe AS s/p TAVRwho transferred to [facility] after presenting to [facility] with complaints of weakness/AMS and found to have Hgb 6.2 presumed secondary to GI bleed.  Per family, patient seemed less alert and weak. She can walk with a walker at baseline and went into the bathroom but unable to get up.  reports frequent UTI's and this is what she assumed was the problem again, she took her to [facility] in [place], [place] where she was found to have Hgb 6.2 and positive hemoccult POC. Patient was given 2 units of blood and transferred to [facility].  Daughter reported melena over past 2 days but reports that this sometimes happens. At baseline she can have limited conversations. She lives with [name] and they have aides come during the day to help care for her.   Hospital course: Patient admitted to [facility] for further evaluation and management with GI consultation.   Subjective:  Patient back from EGD and resting comfortably when seen in rounds this afternoon.  Daughter Neoma Laming at bedside. Objective: Vitals:   02/24/20 1110 02/24/20 1120 02/24/20 1130 02/24/20 1814  BP: (!) 107/42 (!) 131/34 (!) 135/32 (!) 117/42  Pulse: 66 66 68 (!) 56  Resp: 16 (!) 22 (!) 21 20  Temp:    98 F (36.7 C)  TempSrc:    Oral  SpO2: 100% 100% 100% 100%  Height:        Intake/Output Summary (Last 24 hours) at 02/24/2020 1907 Last data filed at 02/24/2020 1643 Gross per 24 hour  Intake 270 ml  Output 950 ml  Net -680 ml   There were no vitals filed for this visit.  Physical Examination:  General exam: Appears calm and comfortable  Respiratory system: Clear to auscultation. Respiratory effort normal. Cardiovascular system: S1 & S2 heard, RRR. No JVD, murmurs, rubs, gallops or clicks. No pedal edema. Gastrointestinal system:  Abdomen is nondistended, soft and nontender. Normal bowel sounds heard. Central nervous system: Alert and oriented x2. No new focal neurological deficits. Extremities: No contractures, edema or joint deformities.  Skin: No rashes, lesions or ulcers Psychiatry: Judgement and insight appear normal. Mood & affect appropriate.   Data Reviewed: I have personally reviewed following labs and imaging studies  CBC: Recent Labs  Lab 02/23/20 0425 02/23/20 1039 02/23/20 1836 02/23/20 2346 02/24/20 0538  WBC 9.6 8.0 7.9 7.2 8.2  HGB 7.8* 7.6* 7.3* 7.3* 7.8*  HCT 22.5* 22.6* 21.8* 22.1* 23.5*  MCV 86.2 88.3 88.3 88.4 90.7  PLT 167 178 166 170 417   Basic Metabolic Panel: Recent Labs  Lab 02/23/20 0425 02/24/20 0538  NA 147* 150*  K 3.9 3.8  CL 121* 118*  CO2 19* 23  GLUCOSE 133* 159*  BUN 50* 30*  CREATININE 1.33* 1.18*  CALCIUM 9.4 9.9  MG 1.7  --    GFR: Estimated Creatinine Clearance: 32.1 mL/min (A) (by C-G formula based on SCr of 1.18 mg/dL (H)). Liver Function Tests: Recent Labs  Lab 02/23/20 0425 02/24/20 0538  AST 22 20  ALT 17 16  ALKPHOS 46 43  BILITOT 0.7 0.2*  PROT 5.0* 5.0*  ALBUMIN 2.7* 2.5*   No results for input(s): LIPASE, AMYLASE in the last 168 hours. No results for input(s): AMMONIA in the last 168 hours. Coagulation Profile: No results for  input(s): INR, PROTIME in the last 168 hours. Cardiac Enzymes: No results for input(s): CKTOTAL, CKMB, CKMBINDEX, TROPONINI in the last 168 hours. BNP (last 3 results) No results for input(s): PROBNP in the last 8760 hours. HbA1C: Recent Labs    02/22/20 2139  HGBA1C 6.1*   CBG: Recent Labs  Lab 02/23/20 2338 02/24/20 0711 02/24/20 1014 02/24/20 1147 02/24/20 1633  GLUCAP 154* 134* 122* 136* 171*   Lipid Profile: No results for input(s): CHOL, HDL, LDLCALC, TRIG, CHOLHDL, LDLDIRECT in the last 72 hours. Thyroid Function Tests: No results for input(s): TSH, T4TOTAL, FREET4, T3FREE, THYROIDAB in  the last 72 hours. Anemia Panel: Recent Labs    02/23/20 1205  FERRITIN 24  TIBC 220*  IRON 38   Sepsis Labs: No results for input(s): PROCALCITON, LATICACIDVEN in the last 168 hours.  Recent Results (from the past 240 hour(s))  Culture, Urine     Status: Abnormal   Collection Time: 02/23/20 12:12 AM   Specimen: Urine, Catheterized  Result Value Ref Range Status   Specimen Description URINE, CATHETERIZED  Final   Special Requests   Final    NONE Performed at [facility], [address], [place], [place] [address]    Culture MULTIPLE SPECIES PRESENT, SUGGEST RECOLLECTION (A)  Final   Report Status 02/24/2020 FINAL  Final      Radiology Studies: No results found.      Scheduled Meds: . sodium chloride   Intravenous Once  . acetaminophen  500 mg Oral BID  . ALPRAZolam  0.5 mg Oral QHS   And  . ALPRAZolam  0.25 mg Oral Daily  . atorvastatin  80 mg Oral Q lunch  . Chlorhexidine Gluconate Cloth  6 each Topical Daily  . donepezil  10 mg Oral QHS  . fluticasone furoate-vilanterol  1 puff Inhalation Daily  . insulin aspart  0-9 Units Subcutaneous TID WC  . memantine  28 mg Oral Daily  . pantoprazole  40 mg Oral BID   Continuous Infusions:  Assessment/Plan:  1.  Upper GI bleed: Patient reported intermittent melena at home.  EGD today revealed a large (4 to 5 cm), submucosal mass with with surface ulceration in the gastric fundus with no active bleeding. There were also several tiny benign fundic gland type polyps present.  General surgery consulted for definitive management but not felt to be a candidate for aggressive interventions given advanced age and multiple comorbidities.  Plavix held and cardiology consulted to advise regarding the need to continue this medication.  Okay to hold Plavix per cardiology.  Remains on IV Protonix twice daily  2.  Acute blood loss anemia: Due to GI bleed in the setting of Plavix therapy, gastric polyp/mass.  Patient had hemoglobin  6.2 on presentation and received 2 units of PRBC with improvement in hemoglobin today.  Plavix discontinued now.  3.  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  Underwent EGD today which showed large gastric fundus submucosal mass with surface ulceration.  Seen by general surgery and not felt to be a candidate for definitive management.  Follow-up biopsy results.  4.  Acute cystitis/UTI: On Rocephin x3 days.  Received antibiotics at outside facility, urine culture here with multiple species likely contamination.  Patient does have history of recurrent UTIs.  5.  AKI, hypernatremia: Likely secondary to dehydration in the setting of problem #1.BUN 50/creatinine 1.33, baseline creatinine from  2019 was 0.86.  Improving with hydration,-Repeat BMP shows downtrend to creatinine 1.18 today.  Avoid nephrotoxins, renally dose medications.  Patient with sodium level at 147 on presentation and now up to 150.  Lasix/lisinopril held.  Will give ginger hypotonic fluids  6.  CAD s/p CABG x4, s/p TAVR in 2016: Well-known to cardiology service.  Seen by Dr. Angelena Form today and recommended to discontinue Plavix given concern for gastric mass/GI bleed and TAVR being done more than 5 years back.  He agreed with palliative care evaluation for care goals.  7.  Chronic diastolic CHF: Appears dehydrated.  Holding Lasix.  Given hypernatremia will give hypotonic fluids as discussed above at a low rate and monitor labs.  Last Echo Nov 2019 with EF 42-39%, grade I diastolic dysfunction  8. Hypertension: Home medications including lisinopril, Lasix, nebivolol and hydralazine on hold and concern for problem #1 and soft blood pressures.  9.  Diabetes mellitus type 2, insulin-dependent: Metformin on hold.  Sliding scale insulin for now.  Resumed on diet post EGD.  10.  Dementia: On Aricept, Namenda.  Currently  oriented to person and place.  Also noted to be on Xanax for anxiety--probably best to avoid benzodiazepines in this elderly patient.  11.  Morbid obesity: No acute issues.  To follow-up PCP as outpatient   DVT prophylaxis: SCDs given GI bleed Code Status: Full code as of now, palliative care consulted for care goal discussion Family / Patient Communication: Discussed with daughter, Neoma Laming at bedside.   Disposition Plan:   Patient is from home prior to hospitalization.Patient lives with daughter [name] who is the primary caregiver. Received/Receiving inpatient care for upper GI bleed, associated acute blood loss anemia, AKI/hypernatremia. Discharge when care goals are established by palliative care team.  According to daughters, patient never wanted to be at [facility].  Likely discharge home with home palliative care versus hospice follow-up.     LOS: 2 days    Time spent:     Guilford Shi, MD [facility] Pager in [place]  If 7PM-7AM, please contact night-coverage [email or web address] 02/24/2020, 7:07 PM

## 2020-02-25 DIAGNOSIS — D62 Acute posthemorrhagic anemia: Secondary | ICD-10-CM

## 2020-02-25 DIAGNOSIS — K250 Acute gastric ulcer with hemorrhage: Secondary | ICD-10-CM

## 2020-02-25 DIAGNOSIS — Z7902 Long term (current) use of antithrombotics/antiplatelets: Secondary | ICD-10-CM

## 2020-02-25 LAB — COMPREHENSIVE METABOLIC PANEL
ALT: 16 U/L (ref 0–44)
AST: 18 U/L (ref 15–41)
Albumin: 2.5 g/dL — ABNORMAL LOW (ref 3.5–5.0)
Alkaline Phosphatase: 45 U/L (ref 38–126)
Anion gap: 5 (ref 5–15)
BUN: 21 mg/dL (ref 8–23)
CO2: 21 mmol/L — ABNORMAL LOW (ref 22–32)
Calcium: 9.6 mg/dL (ref 8.9–10.3)
Chloride: 121 mmol/L — ABNORMAL HIGH (ref 98–111)
Creatinine, Ser: 1.11 mg/dL — ABNORMAL HIGH (ref 0.44–1.00)
GFR calc Af Amer: 51 mL/min — ABNORMAL LOW (ref >60)
GFR calc non Af Amer: 44 mL/min — ABNORMAL LOW (ref >60)
Glucose, Bld: 136 mg/dL — ABNORMAL HIGH (ref 70–99)
Potassium: 3.6 mmol/L (ref 3.5–5.1)
Sodium: 147 mmol/L — ABNORMAL HIGH (ref 135–145)
Total Bilirubin: 0.5 mg/dL (ref 0.3–1.2)
Total Protein: 4.9 g/dL — ABNORMAL LOW (ref 6.5–8.1)

## 2020-02-25 LAB — CBC
HCT: 23.7 % — ABNORMAL LOW (ref 36.0–46.0)
Hemoglobin: 7.8 g/dL — ABNORMAL LOW (ref 12.0–15.0)
MCH: 29.9 pg (ref 26.0–34.0)
MCHC: 32.9 g/dL (ref 30.0–36.0)
MCV: 90.8 fL (ref 80.0–100.0)
Platelets: 181 10*3/uL (ref 150–400)
RBC: 2.61 MIL/uL — ABNORMAL LOW (ref 3.87–5.11)
RDW: 15.9 % — ABNORMAL HIGH (ref 11.5–15.5)
WBC: 9.2 10*3/uL (ref 4.0–10.5)
nRBC: 0 % (ref 0.0–0.2)

## 2020-02-25 LAB — GLUCOSE, CAPILLARY
Glucose-Capillary: 138 mg/dL — ABNORMAL HIGH (ref 70–99)
Glucose-Capillary: 141 mg/dL — ABNORMAL HIGH (ref 70–99)
Glucose-Capillary: 125 mg/dL — ABNORMAL HIGH (ref 70–99)
Glucose-Capillary: 135 mg/dL — ABNORMAL HIGH (ref 70–99)

## 2020-02-25 NOTE — Plan of Care (Signed)
  Problem: Health Behavior/Discharge Planning: Goal: Ability to manage health-related needs will improve Outcome: Progressing   

## 2020-02-25 NOTE — Progress Notes (Addendum)
PROGRESS NOTE    Katherine [name]  QVZ:[id number]  DOB: July 26, 1928  PCP: Celene Squibb, MD Admit date:02/22/2020  86 y.o.femalewith PMHCAD s/p CABG x 4, morbid obesity, HTN, HLD, DM II,and h/o severe AS s/p TAVRwho transferred to [facility] after presenting to [facility] with complaints of weakness/AMS and found to have Hgb 6.2 presumed secondary to GI bleed.  Per family, patient seemed less alert and weak. She can walk with a walker at baseline and went into the bathroom but unable to get up.  reports frequent UTI's and this is what she assumed was the problem again, she took her to [facility] in [place], [place] where she was found to have Hgb 6.2 and positive hemoccult POC. Patient was given 2 units of blood and transferred to [facility].  Daughter reported melena over past 2 days but reports that this sometimes happens. At baseline she can have limited conversations. She lives with Katherine [name] and they have aides come during the day to help care for her.   Hospital course: Patient admitted to [facility] for further evaluation and management with GI consultation.   Subjective:  Patient resting comfortably and being fed by nursing staff. Tolerating diet okay. Appears more awake, alert today. Objective: Vitals:   02/24/20 2156 02/25/20 0601 02/25/20 0939 02/25/20 1702  BP: (!) 150/54 (!) 167/50 (!) 152/60 (!) 160/55  Pulse: 64 61 66 73  Resp:   18 18  Temp: 98.5 F (36.9 C) 98.9 F (37.2 C) 98.8 F (37.1 C) 98.4 F (36.9 C)  TempSrc: Oral Oral Oral Oral  SpO2: 98% 96% 97% 98%  Weight:  81.2 kg    Height:        Intake/Output Summary (Last 24 hours) at 02/25/2020 1900 Last data filed at 02/25/2020 1755 Gross per 24 hour  Intake 1698.75 ml  Output 875 ml  Net 823.75 ml   Filed Weights   02/25/20 0601  Weight: 81.2 kg    Physical Examination:  General exam: Appears calm and comfortable  Respiratory system: Clear to auscultation. Respiratory effort normal. Cardiovascular system: S1 & S2 heard, RRR.  No JVD, murmurs, rubs, gallops or clicks. No pedal edema. Gastrointestinal system: Abdomen is nondistended, soft and nontender. Normal bowel sounds heard. Central nervous system: Alert and oriented x2. No new focal neurological deficits. Extremities: No contractures, edema or joint deformities.  Skin: No rashes, lesions or ulcers Psychiatry: Judgement and insight appear normal. Mood & affect appropriate.   Data Reviewed: I have personally reviewed following labs and imaging studies  CBC: Recent Labs  Lab 02/23/20 1039 02/23/20 1836 02/23/20 2346 02/24/20 0538 02/25/20 0247  WBC 8.0 7.9 7.2 8.2 9.2  HGB 7.6* 7.3* 7.3* 7.8* 7.8*  HCT 22.6* 21.8* 22.1* 23.5* 23.7*  MCV 88.3 88.3 88.4 90.7 90.8  PLT 178 166 170 185 329   Basic Metabolic Panel: Recent Labs  Lab 02/23/20 0425 02/24/20 0538 02/25/20 0247  NA 147* 150* 147*  K 3.9 3.8 3.6  CL 121* 118* 121*  CO2 19* 23 21*  GLUCOSE 133* 159* 136*  BUN 50* 30* 21  CREATININE 1.33* 1.18* 1.11*  CALCIUM 9.4 9.9 9.6  MG 1.7  --   --    GFR: Estimated Creatinine Clearance: 33.2 mL/min (A) (by C-G formula based on SCr of 1.11 mg/dL (H)). Liver Function Tests: Recent Labs  Lab 02/23/20 0425 02/24/20 0538 02/25/20 0247  AST 22 20 18   ALT 17 16 16   ALKPHOS 46 43 45  BILITOT 0.7 0.2* 0.5  PROT 5.0* 5.0* 4.9*  ALBUMIN 2.7* 2.5* 2.5*   No results for input(s): LIPASE, AMYLASE in the last 168 hours. No results for input(s): AMMONIA in the last 168 hours. Coagulation Profile: No results for input(s): INR, PROTIME in the last 168 hours. Cardiac Enzymes: No results for input(s): CKTOTAL, CKMB, CKMBINDEX, TROPONINI in the last 168 hours. BNP (last 3 results) No results for input(s): PROBNP in the last 8760 hours. HbA1C: Recent Labs    02/22/20 2139  HGBA1C 6.1*   CBG: Recent Labs  Lab 02/24/20 1633 02/24/20 2152 02/25/20 0638 02/25/20 1135 02/25/20 1627  GLUCAP 171* 129* 138* 141* 125*   Lipid Profile: No results  for input(s): CHOL, HDL, LDLCALC, TRIG, CHOLHDL, LDLDIRECT in the last 72 hours. Thyroid Function Tests: No results for input(s): TSH, T4TOTAL, FREET4, T3FREE, THYROIDAB in the last 72 hours. Anemia Panel: Recent Labs    02/23/20 1205  FERRITIN 24  TIBC 220*  IRON 38   Sepsis Labs: No results for input(s): PROCALCITON, LATICACIDVEN in the last 168 hours.  Recent Results (from the past 240 hour(s))  Culture, Urine     Status: Abnormal   Collection Time: 02/23/20 12:12 AM   Specimen: Urine, Catheterized  Result Value Ref Range Status   Specimen Description URINE, CATHETERIZED  Final   Special Requests   Final    NONE Performed at [facility], [address] [place], [place] [address]    Culture MULTIPLE SPECIES PRESENT, SUGGEST RECOLLECTION (A)  Final   Report Status 02/24/2020 FINAL  Final      Radiology Studies: No results found.      Scheduled Meds: . acetaminophen  500 mg Oral BID  . ALPRAZolam  0.5 mg Oral QHS   And  . ALPRAZolam  0.25 mg Oral Daily  . atorvastatin  80 mg Oral Q lunch  . Chlorhexidine Gluconate Cloth  6 each Topical Daily  . donepezil  10 mg Oral QHS  . fluticasone furoate-vilanterol  1 puff Inhalation Daily  . insulin aspart  0-9 Units Subcutaneous TID WC  . memantine  28 mg Oral Daily  . pantoprazole  40 mg Oral BID   Continuous Infusions: . sodium chloride 50 mL/hr at 02/24/20 2010    Assessment/Plan:  1.  Upper GI bleed: Patient reported intermittent melena at home.  EGD revealed a large (4 to 5 cm), submucosal mass with with surface ulceration in the gastric fundus with no active bleeding but likely the source. There were also several tiny benign fundic gland type polyps present.  General surgery consulted for definitive management but not felt to be a candidate for aggressive interventions given advanced age and multiple comorbidities.  Plavix held and cardiology consulted to advise regarding the need to continue this  medication.  Okay to hold Plavix per cardiology.  Remains on IV Protonix twice daily--change to PO. GI/GS have recommended conservative approach and signed off  2.  Acute blood loss anemia: Due to GI bleed in the setting of Plavix therapy, gastric polyp/mass.  Patient had hemoglobin 6.2 on presentation and received 2 units of PRBC with improvement in hemoglobin today.  Plavix discontinued now.  3.  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  Underwent EGD today which showed large gastric fundus submucosal mass with surface ulceration.  Seen by general surgery and not felt to be a candidate for definitive management.  Follow-up biopsy  results.  4.  Acute cystitis/UTI: s/p Rocephin x3 days.  Received antibiotics at outside facility, urine culture here with multiple species likely contamination.  Patient does have history of recurrent UTIs.  5.  AKI, hypernatremia: Likely secondary to dehydration in the setting of problem #1.BUN 50/creatinine 1.33, baseline creatinine from 2019 was 0.86.  Improving with hydration,-Repeat BMP shows downtrend to creatinine 1.18 today.  Avoid nephrotoxins, renally dose medications.  Patient with sodium level at 147 on presentation and went up to 150-->improved to 147 with ginger hypotonic fluids. Encourage free water intake.  Lasix/lisinopril held.   6.  CAD s/p CABG x4, s/p TAVR in 2016: Well-known to cardiology service.  Seen by Dr. Angelena Form today and recommended to discontinue Plavix given concern for gastric mass/GI bleed and TAVR being done more than 5 years back.  He agreed with palliative care evaluation for care goals.  7.  Chronic diastolic CHF: Appears dehydrated.  Holding Lasix.  Given hypernatremia will give hypotonic fluids as discussed above at a low rate and monitor labs.  Last Echo Nov 2019 with EF 54-65%, grade I diastolic dysfunction  8.  Hypertension: Home medications including lisinopril, Lasix, nebivolol and hydralazine on hold and concern for problem #1 and soft blood pressures.  9.  Diabetes mellitus type 2, insulin-dependent: Metformin on hold.  Sliding scale insulin for now.  Resumed on diet post EGD.  10.  Dementia: On Aricept, Namenda.  Currently oriented to person and place.  Also noted to be on Xanax for anxiety--probably best to avoid benzodiazepines in this elderly patient.  11.  Morbid obesity: No acute issues.  To follow-up PCP as outpatient   DVT prophylaxis: SCDs given GI bleed Code Status: Full code as of now, palliative care consulted for care goal discussion Family / Patient Communication: Discussed with daughter, Katherine [name] at bedside yesterday, family meeting in am.   Disposition Plan:   Patient is from home prior to hospitalization.Patient lives with daughter Katherine [name] who is the primary caregiver. Received/Receiving inpatient care for upper GI bleed, associated acute blood loss anemia, AKI/hypernatremia. Discharge when care goals are established by palliative care team.  According to daughters, patient never wanted to be at [facility].  Likely discharge home with home palliative care versus hospice follow-up in am after family meeting at noon.     LOS: 3 days    Time spent:     Guilford Shi, MD [facility] Pager in [place]  If 7PM-7AM, please contact night-coverage [email or web address] 02/25/2020, 7:00 PM

## 2020-02-25 NOTE — Plan of Care (Signed)
  Problem: Activity: Goal: Risk for activity intolerance will decrease Outcome: Progressing   Problem: Nutrition: Goal: Adequate nutrition will be maintained Outcome: Progressing   

## 2020-02-25 NOTE — Plan of Care (Signed)
  Problem: Clinical Measurements: Goal: Respiratory complications will improve Outcome: Progressing   

## 2020-02-25 NOTE — Progress Notes (Signed)
PMT consult received and chart reviewed. Patient assessment completed. Patient only oriented to person. Pleasantly confused with baseline dementia. Denies pain or discomfort. Needs family for goals of care. No family at bedside. Spoke with daughter Katherine [name]. Family meeting scheduled with three daughters Katherine [name], Rande Lawman) tomorrow 3/24 at 12pm. Thank you.   NO CHARGE  Ihor Dow, [place], FNP-C Palliative Medicine Team  Phone: [phone] Fax: [phone]

## 2020-02-25 NOTE — Progress Notes (Addendum)
Daily Rounding Note  02/25/2020, 11:11 AM  LOS: 3 days   SUBJECTIVE:   Chief complaint:     BPs, heart rate stable. He has no complaints.  Denies abdominal pain, nausea, no bowel movements today.  OBJECTIVE:         Vital signs in last 24 hours:    Temp:  [98 F (36.7 C)-98.9 F (37.2 C)] 98.8 F (37.1 C) (03/23 0939) Pulse Rate:  [56-68] 66 (03/23 0939) Resp:  [18-22] 18 (03/23 0939) BP: (117-167)/(32-60) 152/60 (03/23 0939) SpO2:  [96 %-100 %] 97 % (03/23 0939) Weight:  [81.2 kg] 81.2 kg (03/23 0601) Last BM Date: 02/25/20 Filed Weights   02/25/20 0601  Weight: 81.2 kg   General: Very pleasant.  Comfortable.  Aged, frail. Heart: RRR with soft murmur. Chest: Diminished on right side but clear.  No labored breathing.  No cough Abdomen: Not tender, not distended.  Active bowel sounds. Extremities: No CCE. Neuro/Psych: Appropriate.  Follows commands and maintains appropriate conversation.  Forgetful on recent details.  Oriented to herself and being in [place].  No tremors.  Intake/Output from previous day: 03/22 0701 - 03/23 0700 In: 737.4 [P.O.:100; I.V.:637.4] Out: 700 [Urine:700]  Intake/Output this shift: Total I/O In: 120 [P.O.:120] Out: 225 [Urine:225]  Lab Results: Recent Labs    02/23/20 2346 02/24/20 0538 02/25/20 0247  WBC 7.2 8.2 9.2  HGB 7.3* 7.8* 7.8*  HCT 22.1* 23.5* 23.7*  PLT 170 185 181   BMET Recent Labs    02/23/20 0425 02/24/20 0538 02/25/20 0247  NA 147* 150* 147*  K 3.9 3.8 3.6  CL 121* 118* 121*  CO2 19* 23 21*  GLUCOSE 133* 159* 136*  BUN 50* 30* 21  CREATININE 1.33* 1.18* 1.11*  CALCIUM 9.4 9.9 9.6   LFT Recent Labs    02/23/20 0425 02/24/20 0538 02/25/20 0247  PROT 5.0* 5.0* 4.9*  ALBUMIN 2.7* 2.5* 2.5*  AST 22 20 18   ALT 17 16 16   ALKPHOS 46 43 45  BILITOT 0.7 0.2* 0.5   PT/INR No results for input(s): LABPROT, INR in the last 72  hours. Hepatitis Panel No results for input(s): HEPBSAG, HCVAB, HEPAIGM, HEPBIGM in the last 72 hours.  Studies/Results: No results found.  ASSESMENT:   *   GIB w dark stools, anemia.   02/24/2020 EGD: 5 cm submucosal, nonbleeding mass with surface ulceration at gastric fundus, not biopsied but felt to be source of recent bleeding.  Benign distal esophageal ring w moderate, nonobstructing stenosis.  Incidental, small gastric polyps.  Small hiatal hernia.  *    Chronic Plavix, on hold. Previous TAVR 2016, CABG 2003.  Dr. Angelena Form states on 3/22 it is okay to indefinitely discontinue Plavix  *    Blood loss anemia. Hgb 6.2  >> 2 PRBCs in [place]  >> 7.3 >> 7.8, stable.  No transfusion since prior to transfer.   Not iron deficient.    *   Hypernatremia.    *   AKI.   Improved.     PLAN   *   Genl surgery ( Dr Donne Hazel) is following the pt and for now plan to observe for ongoing bleeding off Plavix.  "at her age and functional status might be reasonable just to follow her".    *    Advance to soft, carb mod diet given no plans for surgical intervention at present.  *    Continue twice daily Protonix.  *  Follow Hgb  Azucena Freed  02/25/2020, 11:11 AM Phone [phone]  GI ATTENDING  Interval history data reviewed.  Patient seen and examined.  Appreciate both cardiology and general surgery input.  Patient presented with acute GI bleeding secondary to surface ulceration of a large proximal gastric submucosal tumor.  No further bleeding.  Hemoglobin stable.  Cardiology endorses her being off Plavix.  Hopefully, in combination with indefinite twice daily PPI therapy, she will not rebleed.  Surgery is aware should she have significant problems with recurrent bleeding and require a surgical option.  Of course, she is high risk and I endorse the outlined conservative approach.  Nothing further to add from GI medicine perspective.  Will sign off.  Docia Chuck. Geri Seminole., M.D. [facility] Division of Gastroenterology

## 2020-02-25 NOTE — Progress Notes (Signed)
[facility] Progress Note  1 Day Post-Op  Subjective: CC:  Pleasantly demented, talking about wood. Denies abdominal pain.  H&H stable overnight - 7.8/23.7, HR 69, hypertensive  Objective: Vital signs in last 24 hours: Temp:  [98 F (36.7 C)-98.9 F (37.2 C)] 98.9 F (37.2 C) (03/23 0601) Pulse Rate:  [56-69] 61 (03/23 0601) Resp:  [15-25] 20 (03/22 1814) BP: (105-167)/(30-54) 167/50 (03/23 0601) SpO2:  [94 %-100 %] 96 % (03/23 0601) Weight:  [81.2 kg] 81.2 kg (03/23 0601) Last BM Date: 02/22/20  Intake/Output from previous day: 03/22 0701 - 03/23 0700 In: 737.4 [P.O.:100; I.V.:637.4] Out: 700 [Urine:700] Intake/Output this shift: No intake/output data recorded.  PE: Gen:  Alert, NAD, pleasant Card:  Regular rate and rhythm Pulm:  Normal effort Abd: Soft, obese, non-tender, non-distended, no HSM  Skin: warm and dry, no rashes  Psych: oriented to self only  Lab Results:  Recent Labs    02/24/20 0538 02/25/20 0247  WBC 8.2 9.2  HGB 7.8* 7.8*  HCT 23.5* 23.7*  PLT 185 181   BMET Recent Labs    02/24/20 0538 02/25/20 0247  NA 150* 147*  K 3.8 3.6  CL 118* 121*  CO2 23 21*  GLUCOSE 159* 136*  BUN 30* 21  CREATININE 1.18* 1.11*  CALCIUM 9.9 9.6   PT/INR No results for input(s): LABPROT, INR in the last 72 hours. CMP     Component Value Date/Time   NA 147 (H) 02/25/2020 0247   NA 145 06/05/2015 0000   K 3.6 02/25/2020 0247   CL 121 (H) 02/25/2020 0247   CO2 21 (L) 02/25/2020 0247   GLUCOSE 136 (H) 02/25/2020 0247   BUN 21 02/25/2020 0247   BUN 32 (A) 06/05/2015 0000   CREATININE 1.11 (H) 02/25/2020 0247   CREATININE 0.91 10/17/2014 1552   CALCIUM 9.6 02/25/2020 0247   PROT 4.9 (L) 02/25/2020 0247   ALBUMIN 2.5 (L) 02/25/2020 0247   AST 18 02/25/2020 0247   ALT 16 02/25/2020 0247   ALKPHOS 45 02/25/2020 0247   BILITOT 0.5 02/25/2020 0247   GFRNONAA 44 (L) 02/25/2020 0247   GFRAA 51 (L) 02/25/2020 0247   Lipase  No results  found for: LIPASE  Studies/Results: No results found.  Anti-infectives: Anti-infectives (From admission, onward)   Start     Dose/Rate Route Frequency Ordered Stop   02/23/20 1600  cefTRIAXone (ROCEPHIN) 1 g in sodium chloride 0.9 % 100 mL IVPB     1 g 200 mL/hr over 30 Minutes Intravenous Every 24 hours 02/22/20 2029 02/24/20 1701       Assessment/Plan CAD h/o CABG 2003 H/o TAVR on plavix (last does possibly as recent as 6/16) Diastolic CHF HTN DM Dementia AKI vs progressive CKD UTI  Upper GI bleed with acute blood loss anemia - hgb/hct stable 7.8/23.7. transfuse as indicated for hgb < 7.0/symptomatic anemia. Gastric fundus mass - mass present >20 years - s/p upper endoscopy 3/22 by Dr. 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 Based on endoscopy report no biopsy was performed, unfortunately without tissue diagnosis it is not possible to determine if the patient would benefit from non-operative targeted drug treatments. - while surgery is the definitive treatment for this if it is a GIST, the patient is moderate to high risk and the patients daughter has stated to our team that her mother would not want to live in a SNF while recovering.  - continue IV protonix  - continue to hold  plavix indefinitely per cards, appreciate their evaluation  ID - rocephin 3/21>>3/23 for UTI VTE - SCDs FEN - IVF, CLD Foley - in place Follow up - TBD   LOS: 3 days    Obie Dredge, [facility] Pager: [phone]

## 2020-02-26 DIAGNOSIS — N390 Urinary tract infection, site not specified: Secondary | ICD-10-CM

## 2020-02-26 DIAGNOSIS — Z7189 Other specified counseling: Secondary | ICD-10-CM

## 2020-02-26 DIAGNOSIS — Z515 Encounter for palliative care: Secondary | ICD-10-CM

## 2020-02-26 DIAGNOSIS — I5032 Chronic diastolic (congestive) heart failure: Secondary | ICD-10-CM

## 2020-02-26 DIAGNOSIS — K3189 Other diseases of stomach and duodenum: Secondary | ICD-10-CM

## 2020-02-26 LAB — CBC
HCT: 25 % — ABNORMAL LOW (ref 36.0–46.0)
Hemoglobin: 8.4 g/dL — ABNORMAL LOW (ref 12.0–15.0)
MCH: 30.1 pg (ref 26.0–34.0)
MCHC: 33.6 g/dL (ref 30.0–36.0)
MCV: 89.6 fL (ref 80.0–100.0)
Platelets: 215 10*3/uL (ref 150–400)
RBC: 2.79 MIL/uL — ABNORMAL LOW (ref 3.87–5.11)
RDW: 15.1 % (ref 11.5–15.5)
WBC: 9.1 10*3/uL (ref 4.0–10.5)
nRBC: 0 % (ref 0.0–0.2)

## 2020-02-26 LAB — BASIC METABOLIC PANEL
Anion gap: 7 (ref 5–15)
BUN: 11 mg/dL (ref 8–23)
CO2: 24 mmol/L (ref 22–32)
Calcium: 9.6 mg/dL (ref 8.9–10.3)
Chloride: 114 mmol/L — ABNORMAL HIGH (ref 98–111)
Creatinine, Ser: 1.02 mg/dL — ABNORMAL HIGH (ref 0.44–1.00)
GFR calc Af Amer: 56 mL/min — ABNORMAL LOW (ref >60)
GFR calc non Af Amer: 48 mL/min — ABNORMAL LOW (ref >60)
Glucose, Bld: 148 mg/dL — ABNORMAL HIGH (ref 70–99)
Potassium: 3.6 mmol/L (ref 3.5–5.1)
Sodium: 145 mmol/L (ref 135–145)

## 2020-02-26 LAB — GLUCOSE, CAPILLARY
Glucose-Capillary: 132 mg/dL — ABNORMAL HIGH (ref 70–99)
Glucose-Capillary: 146 mg/dL — ABNORMAL HIGH (ref 70–99)
Glucose-Capillary: 127 mg/dL — ABNORMAL HIGH (ref 70–99)
Glucose-Capillary: 148 mg/dL — ABNORMAL HIGH (ref 70–99)
Glucose-Capillary: 141 mg/dL — ABNORMAL HIGH (ref 70–99)

## 2020-02-26 LAB — SARS CORONAVIRUS 2 (TAT 6-24 HRS): SARS Coronavirus 2: NEGATIVE

## 2020-02-26 NOTE — Plan of Care (Signed)
  Problem: Health Behavior/Discharge Planning: Goal: Ability to manage health-related needs will improve Outcome: Progressing   

## 2020-02-26 NOTE — NC FL2 (Signed)
[place] MEDICAID [id number] LEVEL OF CARE SCREENING TOOL     IDENTIFICATION  Patient Name: Katherine [name] Birthdate: 11/10/1928 Sex: female Admission Date (Current Location): 02/22/2020  [place] and [place] Number:  Katherine [name] [id number] [place] and Address:  [facility] [place], [place] [address], [place], [place] [address]      Provider Number: [id number]  Attending Physician Name and Address:  Guilford Shi, MD  Relative Name and Phone Number:  Teeghan Hammer [phone]    Current Level of Care: Hospital Recommended Level of Care: [place] Prior Approval Number:    Date Approved/Denied: 05/31/15 PASRR Number: [id number]  Discharge Plan: SNF    Current Diagnoses: Patient Active Problem List   Diagnosis Date Noted  . Palliative care by specialist   . Goals of care, counseling/discussion   . Esophageal ring   . Heme positive stool   . GI bleed 02/22/2020  . Advanced dementia ([place]) 02/22/2020  . Coagulation disorder ([place]) 06/04/2019  . Urinary tract infection 06/26/2018  . Chronic diastolic CHF (congestive heart failure) ([place]) 06/26/2018  . Cough 11/23/2016  . Chronic diastolic heart failure ([place])   . S/p TAVR (transcatheter aortic valve replacement), bioprosthetic 09/02/2015  . Constipation 06/04/2015  . Postoperative pulmonary edema ([place]) 06/04/2015  . Severe aortic stenosis   . Severe aortic valve stenosis 05/26/2015  . Bilateral leg edema 10/17/2014  . CHF with left ventricular diastolic dysfunction, NYHA class 2 ([place]) 09/09/2014  . Difficulty walking 10/28/2013  . Muscle weakness (generalized) 10/28/2013  . Morbid obesity ([place]) 10/24/2013  . Moderate to severe aortic stenosis 04/22/2013  . S/P CABG x 4 04/22/2013  . Gastric mass 08/18/2011  . Diabetes mellitus, type 2 ([place])   . COLONIC POLYPS, HX OF 03/04/2010  . THYROID MASS 02/25/2010  . Diabetes mellitus type 2, uncontrolled ([place]) 02/25/2010  . Hyperlipidemia  02/25/2010  . UNSPECIFIED ANEMIA 02/25/2010  . Anxiety state 02/25/2010  . Essential hypertension 02/25/2010  . Coronary atherosclerosis 02/25/2010  . REFLUX ESOPHAGITIS 02/25/2010  . HIATAL HERNIA 02/25/2010  . OSTEOARTHRITIS 02/25/2010  . History of peptic ulcer disease 02/25/2010    Orientation RESPIRATION BLADDER Height & Weight     Self  Normal Continent Weight: 79 kg Height:  5\' 2"  (157.5 cm)  BEHAVIORAL SYMPTOMS/MOOD NEUROLOGICAL BOWEL NUTRITION STATUS      Incontinent Diet  AMBULATORY STATUS COMMUNICATION OF NEEDS Skin   Extensive Assist Verbally Normal                       Personal Care Assistance Level of Assistance  Bathing, Dressing, Feeding Bathing Assistance: Maximum assistance Feeding assistance: Limited assistance Dressing Assistance: Maximum assistance     Functional Limitations Info  Sight, Hearing, Speech Sight Info: Adequate Hearing Info: Adequate Speech Info: Adequate    SPECIAL CARE FACTORS FREQUENCY  PT (By licensed PT), OT (By licensed OT)     PT Frequency: PT at SNF to eval and treat a min of 5x/week OT Frequency: OT at SNF to eval and treat a min of 5x/week            Contractures Contractures Info: Not present    Additional Factors Info  Code Status, Allergies Code Status Info: DNR Allergies Info: tape           Current Medications (02/26/2020):  This is the current hospital active medication list Current Facility-Administered Medications  Medication Dose Route Frequency Provider Last Rate Last Admin  . acetaminophen (TYLENOL) tablet 500  mg  500 mg Oral BID Irene Shipper, MD   500 mg at 02/26/20 0845  . albuterol (PROVENTIL) (2.5 MG/3ML) 0.083% nebulizer solution 2.5 mg  2.5 mg Inhalation Q6H PRN Irene Shipper, MD      . ALPRAZolam Duanne Moron) tablet 0.5 mg  0.5 mg Oral QHS Irene Shipper, MD   0.5 mg at 02/25/20 2238   And  . ALPRAZolam Duanne Moron) tablet 0.25 mg  0.25 mg Oral Daily Irene Shipper, MD   0.25 mg at 02/26/20 0845   . atorvastatin (LIPITOR) tablet 80 mg  80 mg Oral Q lunch Irene Shipper, MD   80 mg at 02/26/20 1133  . Chlorhexidine Gluconate Cloth 2 % PADS 6 each  6 each Topical Daily Irene Shipper, MD   6 each at 02/26/20 [phone]  . donepezil (ARICEPT) tablet 10 mg  10 mg Oral QHS Irene Shipper, MD   10 mg at 02/25/20 2238  . fluticasone furoate-vilanterol (BREO ELLIPTA) 100-25 MCG/INH 1 puff  1 puff Inhalation Daily Irene Shipper, MD   1 puff at 02/24/20 [phone]  . insulin aspart (novoLOG) injection 0-9 Units  0-9 Units Subcutaneous TID WC Irene Shipper, MD   1 Units at 02/26/20 1133  . lip balm (CARMEX) ointment   Topical PRN Irene Shipper, MD      . memantine (NAMENDA XR) 24 hr capsule 28 mg  28 mg Oral Daily Irene Shipper, MD   28 mg at 02/26/20 0846  . pantoprazole (PROTONIX) EC tablet 40 mg  40 mg Oral BID Vena Rua, PA-C   40 mg at 02/26/20 5885     Discharge Medications: Please see discharge summary for a list of discharge medications.  Relevant Imaging Results:  Relevant Lab Results:   Additional Information SSN [id number]  Bartholomew Crews, RN

## 2020-02-26 NOTE — Consult Note (Signed)
Consultation Note Date: 02/26/2020   Patient Name: Katherine [name]  DOB: [birth year]  MRN: [id number]  Age / Sex: 86 y.o., female  PCP: Celene Squibb, MD Referring Physician: Guilford Shi, MD  Reason for Consultation: Establishing goals of care  HPI/Patient Profile: 86 y.o. female  with past medical history of dementia, CAD s/p CABG, morbid obesity, HTN, HLD, DM type 2, severe aortic stenosis s/p TAVR admitted on 02/22/2020 with weakness, altered mental status. Found to have Hbg 6.2 and hemoccult positive stool, received 2 units PRBC. Underwent EGD on 3/22 revealing large (4 to 5 cm) submucosal mass with surface ulceration, likely source of bleeding. General surgery evaluated and patient is a moderate-high risk surgical candidate. Recommended conservative management. Cardiology consulted and recommending discontinuation of Plavix indefinitely. Patient with baseline dementia and declining functional status prior to admission, requiring assist with all ADL's. Palliative medicine consultation for goals of care.   Clinical Assessment and Goals of Care:  I have reviewed medical records, discussed with care team, and met with patient's three daughters at bedside Katherine [name], Katherine [name], and Katherine [name]). Patient is alert and accepting bites of lunch from daughter. She is disoriented with baseline dementia. Unable to participate in [place] discussion. Denies pain and appears comfortable during visit.   I introduced Palliative Medicine as specialized medical care for people living with serious illness. It focuses on providing relief from the symptoms and stress of a serious illness. The goal is to improve quality of life for both the patient and the family.  We discussed a brief life review of the patient. Daughters share how wonderful a woman their mother is. Prior to health decline, very active individual. 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 Diagnosed with dementia approximately 5 years ago around when TAVR was completed. Prior to admission, patient living home with daughter, Katherine [name]. Patient is dependent on daughter or caregivers for all ADL's. Does not ambulate but is placed in chair/WC most days. Patient receives caregiver support through [facility] program.   Discussed events leading up to admission and course of hospitalization including diagnoses, interventions, plan of care. Reviewed recommendations from specialists including conservative management for gastric mass and high risk candidate for surgical intervention with underlying dementia. Discussed in detail disease trajectory and expectations of progressive dementia.   I attempted to elicit values and goals of care important to the patient and daughters. Most importantly, daughters do not wish to see her suffer or be in pain. The patient has recently told them she is 'ready' and does not appear to be afraid of end-of-life.   Katherine [name] presents her mother's living will documentation. The patient has clearly written "I desire that my life not be prolonged by life-sustaining procedures if I am terminally ill, permanently in a coma, suffer severe dementia, or in a persistent vegetative state." The patient has appointed Katherine [name] as primary Katherine [name] as secondary HCPOA. Unfortunately, there were a few pages missing from  this documentation, therefore was not scanned for EMR but most importantly, all three daughters are in agreement with goals for their mother.   MOST form completed with Katherine [name], and Katherine [name]. Educated on medical recommendation for DNR/DNI with underlying age, frailty, poor outcomes of CPR, and underlying chronic co-morbidities. Decisions include DNR/DNI, limited additional interventions including re-hospitalization, treat the treatable, IVF/ABX if indicated, and NO feeding tube.  Wanda signed MOST. Durable DNR completed. Also electronic [employer]yn[employer] MOST form completed.  The difference between aggressive medical intervention and comfort care was considered. Educated in detail difference between outpatient palliative and hospice options.   Katherine [name] shares that is has become harder to care for her mother at home. Daughters feel it is best to pursue long-term care placement. They are hopeful she could do some therapy and then transition into long-term care at facility. Daughters wish for outpatient palliative follow-up, understanding this can transition to hospice services at any time.   Questions and concerns were addressed.  Hard Choices booklet left for review. PMT contact information given.     SUMMARY OF RECOMMENDATIONS    GOC discussion with three daughters Katherine [name], Katherine [name]).  Documented living will with Katherine [name] as primary HCPOA and Katherine [name] as secondary HCPOA. Daughters are all in agreement with goals of care. Patient has clearly documented wishes against life-prolonging interventions if terminally ill.   MOST form completed. DNR/DNI, limited additional interventions including treating the treatable and re-hospitalization if necessary, IVF/ABX if indicated, and NO feeding tube. Durable DNR completed. Vynca MOST completed. Copies made for three daughters.   Continue current plan of care and medical management per attending.  PT consult  Patient with declining functional status and progressive dementia. Daughters are interested in SNF rehab placement and eventually transition to long-term care at a facility. Daughters interested in outpatient palliative referral understanding this can transition to hospice when appropriate and when goals are aimed at comfort. TOC team notified and will assist with disposition.    Code Status/Advance Care Planning:  DNR  Symptom Management:   Per attending  Palliative Prophylaxis:   Aspiration, Delirium Protocol, Frequent Pain  Assessment, Oral Care and Turn Reposition  Psycho-social/Spiritual:   Desire for further Chaplaincy support: yes  Additional Recommendations: Caregiving  Support/Resources, Compassionate Wean Education and Education on Hospice  Prognosis:   Poor long-term prognosis with progressive dementia and gastric tumor felt to be high risk candidate for surgery  Discharge Planning: To Be Determined: SNF rehab versus long-term care facility. Outpatient palliative referral.       Primary Diagnoses: Present on Admission: . GI bleed . Diabetes mellitus type 2, uncontrolled ([place]) . Hyperlipidemia . Anxiety state . Essential hypertension . Coronary atherosclerosis . Morbid obesity ([place]) . Chronic diastolic heart failure ([place]) . Chronic diastolic CHF (congestive heart failure) ([place]) . Urinary tract infection . (Resolved) UTI (urinary tract infection)   I have reviewed the medical record, interviewed the patient and family, and examined the patient. The following aspects are pertinent.  Past Medical History:  Diagnosis Date  . Acute bronchitis 12/16/2015  . Acute exacerbation of CHF (congestive heart failure) ([place]) 11/20/2016  . Acute metabolic encephalopathy 2/75/1700  . Anemia   . Anxiety   . Aortic valvular stenosis    moderate to severe  EF on 2D Echo 10/22/2012 65-70%  valve area of 1.2cm, and peak and mean gradients of 45 and 40mHg  . Bronchospasm 12/16/2015  . Chalazion of right upper eyelid early June 2016   treating with cream, Rx by Dr.  Merlyn Albert    . CHF (congestive heart failure) ([place])   . Congestive heart failure ([place]) 03/04/2010   Qualifier: Diagnosis of  By: Tamala Julian CMA, Claiborne Billings    . Coronary artery disease   . Dementia ([place])   . Diabetes mellitus without complication ([place]) 06/12/6282  . Diabetes mellitus, type 2 ([place])   . Dyspnea    chronic  . Easy fatigability   . Gastric mass   . Gastric ulcer   . GERD (gastroesophageal reflux disease)   . Heart murmur   . Hernia of  unspecified site of abdominal cavity without mention of obstruction or gangrene    hiatal  . Hyperlipidemia   . Hypertension   . Hypokalemia 11/23/2016  . Normal cardiac stress test 12/2013   normal stress nuclear study  . Osteoarthritis   . Pain due to onychomycosis of toenails of both feet 06/04/2019  . Thyroid mass    Social History   Socioeconomic History  . Marital status: Widowed    Spouse name: Not on file  . Number of children: 5  . Years of education: Not on file  . Highest education level: Not on file  Occupational History  . Occupation: Health and safety inspector and CNA  Tobacco Use  . Smoking status: Never Smoker  . Smokeless tobacco: Never Used  Substance and Sexual Activity  . Alcohol use: No  . Drug use: No  . Sexual activity: Not on file  Other Topics Concern  . Not on file  Social History Narrative  . Not on file   Social Determinants of Health   Financial Resource Strain:   . Difficulty of Paying Living Expenses:   Food Insecurity:   . Worried About Charity fundraiser in the Last Year:   . Arboriculturist in the Last Year:   Transportation Needs:   . Film/video editor (Medical):   Marland Kitchen Lack of Transportation (Non-Medical):   Physical Activity:   . Days of Exercise per Week:   . Minutes of Exercise per Session:   Stress:   . Feeling of Stress :   Social Connections:   . Frequency of Communication with Friends and Family:   . Frequency of Social Gatherings with Friends and Family:   . Attends Religious Services:   . Active Member of Clubs or Organizations:   . Attends Archivist Meetings:   Marland Kitchen Marital Status:    Family History  Problem Relation Age of Onset  . Diabetes Mother   . Heart disease Mother   . Stroke Mother   . Cerebral aneurysm Mother   . Stroke Father   . Heart attack Brother   . Colon cancer Neg Hx    Scheduled Meds: . acetaminophen  500 mg Oral BID  . ALPRAZolam  0.5 mg Oral QHS   And  . ALPRAZolam  0.25 mg Oral  Daily  . atorvastatin  80 mg Oral Q lunch  . Chlorhexidine Gluconate Cloth  6 each Topical Daily  . donepezil  10 mg Oral QHS  . fluticasone furoate-vilanterol  1 puff Inhalation Daily  . insulin aspart  0-9 Units Subcutaneous TID WC  . memantine  28 mg Oral Daily  . pantoprazole  40 mg Oral BID   Continuous Infusions: PRN Meds:.albuterol, lip balm Medications Prior to Admission:  Prior to Admission medications   Medication Sig Start Date End Date Taking? Authorizing Provider  acetaminophen (TYLENOL) 500 MG tablet Take 1,000 mg by mouth 2 (two) times  daily. for pain. Do not exceed 4gm of Tylenol in 24 hours   Yes [provider]  albuterol (VENTOLIN HFA) 108 (90 Base) MCG/ACT inhaler Inhale 1 puff into the lungs every 6 (six) hours as needed for wheezing or shortness of breath.   Yes [provider]  ALPRAZolam (XANAX) 0.5 MG tablet Take 0.125-0.5 mg by mouth See admin instructions. Take 1/4 tablet (0.125 mg) by mouth every morning and 1 tablet (0.5 mg) at night, may also take 1/4 tablet (0.125 mg) with lunch as needed for anxiety. 02/11/20  Yes [provider]  atorvastatin (LIPITOR) 80 MG tablet Take 80 mg by mouth daily with lunch.    Yes [provider]  budesonide-formoterol (SYMBICORT) 80-4.5 MCG/ACT inhaler Inhale 2 puffs into the lungs 2 (two) times daily as needed (shortness of breath/wheezing).   Yes [provider]  cetirizine (ZYRTEC) 10 MG tablet Take 10 mg by mouth daily as needed for allergies or rhinitis.   Yes [provider]  clopidogrel (PLAVIX) 75 MG tablet Take 1 tablet (75 mg total) by mouth daily with breakfast. 06/03/15  Yes Barrett, Rhonda G, PA-C  donepezil (ARICEPT) 10 MG tablet Take 10 mg by mouth at bedtime. 02/07/20  Yes [provider]  esomeprazole (NEXIUM) 20 MG capsule Take 20 mg by mouth daily.    Yes [provider]  furosemide (LASIX) 20 MG tablet TAKE 2 TABLETS BY MOUTH EVERY DAY Patient  taking differently: Take 20 mg by mouth 2 (two) times daily with breakfast and lunch.  02/20/20  Yes Hilty, Nadean Corwin, MD  Homeopathic Products ([place]) Apply 1 application topically 2 (two) times daily as needed (pain).   Yes [provider]  hydrALAZINE (APRESOLINE) 50 MG tablet Take 1 tablet (50 mg total) by mouth every 8 (eight) hours. Patient taking differently: Take 50 mg by mouth every 8 (eight) hours. For blood pressure 06/03/15  Yes Barrett, Rhonda G, PA-C  insulin detemir (LEVEMIR) 100 UNIT/ML injection Inject 0.1 mLs (10 Units total) into the skin at bedtime. Patient taking differently: Inject 12 Units into the skin at bedtime.  06/28/18  Yes Isaac Bliss, Rayford Halsted, MD  ipratropium (ATROVENT) 0.03 % nasal spray Place 1 spray into both nostrils daily as needed for rhinitis (sneezing).  11/04/19  Yes [provider]  Linaclotide (LINZESS) 145 MCG CAPS capsule Take 145 mcg by mouth daily as needed (for constipation).   Yes [provider]  lisinopril (PRINIVIL,ZESTRIL) 10 MG tablet Take 1 tablet (10 mg total) by mouth daily. 06/03/15  Yes Barrett, Evelene Croon, PA-C  loperamide (IMODIUM) 2 MG capsule Take 2 mg by mouth 2 (two) times daily as needed for diarrhea or loose stools.    Yes [provider]  Memantine HCl ER (NAMENDA XR) 28 MG CP24 Take 28 mg by mouth daily. for dementia   Yes [provider]  metFORMIN (GLUCOPHAGE-XR) 500 MG 24 hr tablet Take 500 mg by mouth 2 (two) times daily with breakfast and lunch. To control blood sugar. Do not crush. 03/16/15  Yes [provider]  Multiple Vitamin (MULTIVITAMIN WITH MINERALS) TABS tablet Take 1 tablet by mouth daily.   Yes [provider]  Naphazoline HCl (CLEAR EYES OP) Place 1 drop into both eyes daily as needed (dry eyes).   Yes [provider]  nebivolol (BYSTOLIC) 10 MG tablet Take 10 mg by mouth daily. for blood pressure   Yes [provider]    NYSTATIN powder Apply  1 application topically 2 (two) times daily as needed for for breast care. 11/15/16  Yes [provider]  potassium chloride SA (K-DUR,KLOR-CON) 20 MEQ tablet Take 20 mEq by mouth 2 (two) times daily with breakfast and lunch.    Yes [provider]   Allergies  Allergen Reactions  . Tape Other (See Comments)    SKIN IS VERY THIN AND WILL TEAR EASILY!!   Review of Systems  Unable to perform ROS: Dementia   Physical Exam Vitals and nursing note reviewed.  Constitutional:      Appearance: She is ill-appearing.  HENT:     Head: Normocephalic and atraumatic.  Pulmonary:     Effort: No tachypnea, accessory muscle usage or respiratory distress.  Skin:    General: Skin is warm and dry.  Neurological:     Mental Status: She is easily aroused.     Comments: Oriented to person, otherwise disoriented with baseline dementia  Psychiatric:        Attention and Perception: She is inattentive.        Speech: Speech is delayed.        Cognition and Memory: Cognition is impaired.    Vital Signs: BP 136/73 (BP Location: Left Arm)   Pulse 62   Temp 97.6 F (36.4 C) (Oral)   Resp (!) 21   Ht _0  (1.575 m)   Wt 79 kg   SpO2 100%   BMI 31.85 kg/m  Pain Scale: 0-10   Pain Score: 3    SpO2: SpO2: 100 % O2 Device:SpO2: 100 % O2 Flow Rate: .O2 Flow Rate (L/min): 2 L/min  IO: Intake/output summary:   Intake/Output Summary (Last 24 hours) at 02/26/2020 1656 Last data filed at 02/26/2020 3500 Gross per 24 hour  Intake 360 ml  Output 750 ml  Net -390 ml    LBM: Last BM Date: 02/26/20 Baseline Weight: Weight: 81.2 kg Most recent weight: Weight: 79 kg     Palliative Assessment/Data: PPS 40%   Flowsheet Rows     Most Recent Value  Intake Tab  Referral Department  Hospitalist  Unit at Time of Referral  Med/Surg Unit  Palliative Care Primary Diagnosis  Other (Comment)  Palliative Care Type  New Palliative care  Reason for referral  Clarify  Goals of Care  Date first seen by Palliative Care  02/25/20  Clinical Assessment  Palliative Performance Scale Score  40%  Psychosocial & Spiritual Assessment  Palliative Care Outcomes  Patient/Family meeting held?  Yes  Who was at the meeting?  three daughters Katherine [name], Katherine [name])  Palliative Care Outcomes  Clarified goals of care, Counseled regarding hospice, Provided end of life care assistance, Provided advance care planning, Provided psychosocial or spiritual support, Changed CPR status, Completed durable DNR, Linked to palliative care logitudinal support, ACP counseling assistance      Time In: 1210 Time Out: 1350 Time Total: 133mn Greater than 50%  of this time was spent counseling and coordinating care related to the above assessment and plan.  Signed by:  MIhor Dow DNP, FNP-C Palliative Medicine Team  Phone: [phone]: [phone]  Please contact Palliative Medicine Team phone at [phone] questions and concerns.  For individual provider: See AShea Evans

## 2020-02-26 NOTE — Progress Notes (Signed)
Patient BP 189/61, HR 71. M. Sharlet Salina, NP paged. No new orders given.

## 2020-02-26 NOTE — Progress Notes (Signed)
PROGRESS NOTE    BASYA CASAVANT  JJO:[id number]  DOB: Feb 24, 1928  PCP: Celene Squibb, MD Admit date:02/22/2020  86 y.o.femalewith PMHCAD s/p CABG x 4, morbid obesity, HTN, HLD, DM II,and h/o severe AS s/p TAVRwho transferred to [facility] after presenting to [facility] with complaints of weakness/AMS and found to have Hgb 6.2 presumed secondary to GI bleed.  Per family, patient seemed less alert and weak. She can walk with a walker at baseline and went into the bathroom but unable to get up.  reports frequent UTI's and this is what she assumed was the problem again, she took her to [facility] in [place], [place] where she was found to have Hgb 6.2 and positive hemoccult POC. Patient was given 2 units of blood and transferred to [facility].  Daughter reported melena over past 2 days but reports that this sometimes happens. At baseline she can have limited conversations. She lives with [name] and they have aides come during the day to help care for her.  Hospital course: Patient admitted to [facility] for further evaluation and management with GI consultation.  Underwent EGD on 3/22 revealing large (4 to 5 cm), submucosal mass with with surface ulceration, likely the source of bleeding.  General surgery evaluated patient and did not feel candidate for aggressive interventions after discussing with family.  Cardiology evaluated and stated okay to hold off on Plavix indefinitely.  Subjective:Patient resting comfortably and eating fairly per bedside nurse, does better with soft diet.  She appears much more awake alert and communicative today.  Daughter at bedside.   Objective: Vitals:   02/26/20 0528 02/26/20 0542 02/26/20 0623 02/26/20 1000  BP: (!) 196/71 (!) 189/61 (!) 176/58 136/73  Pulse: 74 71 67 62  Resp: 20   (!) 21  Temp: 98.3 F (36.8 C)   97.6 F (36.4 C)  TempSrc: Oral   Oral  SpO2: 99% 96% 98% 100%  Weight:  79 kg    Height:        Intake/Output Summary (Last 24 hours) at 02/26/2020 1638 Last data  filed at 02/26/2020 1601 Gross per 24 hour  Intake 360 ml  Output 750 ml  Net -390 ml   Filed Weights   02/25/20 0601 02/26/20 0542  Weight: 81.2 kg 79 kg    Physical Examination:  General exam: Elderly, obese female who appears calm and comfortable  Respiratory system: Clear to auscultation. Respiratory effort normal. Cardiovascular system: S1 & S2 heard, RRR. No JVD, murmurs. No pedal edema. Gastrointestinal system: Abdomen is obese, nondistended, soft and nontender.  Central nervous system: Alert and oriented x2. No new focal neurological deficits. Extremities: No contractures, edema or joint deformities.  Skin: No rashes, lesions or ulcers Psychiatry: Judgement and insight appear subnormal. Mood & affect appropriate.   Data Reviewed: I have personally reviewed following labs and imaging studies  CBC: Recent Labs  Lab 02/23/20 1836 02/23/20 2346 02/24/20 0538 02/25/20 0247 02/26/20 0555  WBC 7.9 7.2 8.2 9.2 9.1  HGB 7.3* 7.3* 7.8* 7.8* 8.4*  HCT 21.8* 22.1* 23.5* 23.7* 25.0*  MCV 88.3 88.4 90.7 90.8 89.6  PLT 166 170 185 181 093   Basic Metabolic Panel: Recent Labs  Lab 02/23/20 0425 02/24/20 0538 02/25/20 0247 02/26/20 0555  NA 147* 150* 147* 145  K 3.9 3.8 3.6 3.6  CL 121* 118* 121* 114*  CO2 19* 23 21* 24  GLUCOSE 133* 159* 136* 148*  BUN 50* 30* 21 11  CREATININE 1.33* 1.18* 1.11* 1.02*  CALCIUM  9.4 9.9 9.6 9.6  MG 1.7  --   --   --    GFR: Estimated Creatinine Clearance: 35.7 mL/min (A) (by C-G formula based on SCr of 1.02 mg/dL (H)). Liver Function Tests: Recent Labs  Lab 02/23/20 0425 02/24/20 0538 02/25/20 0247  AST 22 20 18   ALT 17 16 16   ALKPHOS 46 43 45  BILITOT 0.7 0.2* 0.5  PROT 5.0* 5.0* 4.9*  ALBUMIN 2.7* 2.5* 2.5*   No results for input(s): LIPASE, AMYLASE in the last 168 hours. No results for input(s): AMMONIA in the last 168 hours. Coagulation Profile: No results for input(s): INR, PROTIME in the last 168 hours. Cardiac  Enzymes: No results for input(s): CKTOTAL, CKMB, CKMBINDEX, TROPONINI in the last 168 hours. BNP (last 3 results) No results for input(s): PROBNP in the last 8760 hours. HbA1C: No results for input(s): HGBA1C in the last 72 hours. CBG: Recent Labs  Lab 02/25/20 1627 02/25/20 2159 02/26/20 0642 02/26/20 0728 02/26/20 1119  GLUCAP 125* 135* 132* 146* 127*   Lipid Profile: No results for input(s): CHOL, HDL, LDLCALC, TRIG, CHOLHDL, LDLDIRECT in the last 72 hours. Thyroid Function Tests: No results for input(s): TSH, T4TOTAL, FREET4, T3FREE, THYROIDAB in the last 72 hours. Anemia Panel: No results for input(s): VITAMINB12, FOLATE, FERRITIN, TIBC, IRON, RETICCTPCT in the last 72 hours. Sepsis Labs: No results for input(s): PROCALCITON, LATICACIDVEN in the last 168 hours.  Recent Results (from the past 240 hour(s))  Culture, Urine     Status: Abnormal   Collection Time: 02/23/20 12:12 AM   Specimen: Urine, Catheterized  Result Value Ref Range Status   Specimen Description URINE, CATHETERIZED  Final   Special Requests   Final    NONE Performed at [facility], [address], [place], [place] [address]    Culture MULTIPLE SPECIES PRESENT, SUGGEST RECOLLECTION (A)  Final   Report Status 02/24/2020 FINAL  Final      Radiology Studies: No results found.      Scheduled Meds: . acetaminophen  500 mg Oral BID  . ALPRAZolam  0.5 mg Oral QHS   And  . ALPRAZolam  0.25 mg Oral Daily  . atorvastatin  80 mg Oral Q lunch  . Chlorhexidine Gluconate Cloth  6 each Topical Daily  . donepezil  10 mg Oral QHS  . fluticasone furoate-vilanterol  1 puff Inhalation Daily  . insulin aspart  0-9 Units Subcutaneous TID WC  . memantine  28 mg Oral Daily  . pantoprazole  40 mg Oral BID   Continuous Infusions:   Assessment/Plan:  1.  Upper GI bleed: Patient reported intermittent melena at home.  EGD revealed a large (4 to 5 cm), submucosal mass with with surface ulceration in the  gastric fundus with no active bleeding but likely the source. There were also several tiny benign fundic gland type polyps present.  General surgery consulted for definitive management but not felt to be a candidate for aggressive interventions given advanced age and multiple comorbidities.  Plavix held and cardiology consulted to advise regarding the need to continue this medication.  Okay to hold Plavix per cardiology.  Continue Protonix twice daily. GI/GS have recommended conservative approach and signed off  2.  Acute blood loss anemia: Due to GI bleed in the setting of Plavix therapy, gastric polyp/mass.  Patient had hemoglobin 6.2 on presentation and received 2 units of PRBC with improvement in hemoglobin today to greater than 8.  Plavix discontinued now.  3.  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  Underwent EGD today which showed large gastric fundus submucosal mass with surface ulceration.  Seen by general surgery and not felt to be a candidate for definitive management.  Per report, biopsy not obtained  4.  Acute cystitis/UTI: s/p Rocephin x3 days.  Received antibiotics at outside facility, urine culture here with multiple species likely contamination.  Patient does have history of recurrent UTIs.  5.  AKI, hypernatremia: Likely secondary to dehydration in the setting of problem #1.BUN 50/creatinine 1.33, baseline creatinine from 2019 was 0.86.  Improving with hydration,-Repeat BMP shows downtrend to creatinine 1.18 today.  Avoid nephrotoxins, renally dose medications.  Patient with sodium level at 147 on presentation and went up to 150-->improved now to 145 with ginger hypotonic fluids and with improved free water intake.  Lasix/lisinopril remains on hold.   6.  CAD s/p CABG x4, s/p TAVR in 2016: Well-known to cardiology service.  Seen by Dr. Angelena Form today and recommended  to discontinue Plavix given concern for gastric mass/GI bleed and TAVR being done more than 5 years back.  He agreed with palliative care evaluation for care goals.  7.  Chronic diastolic CHF: Appears dehydrated.  Holding Lasix.  Given hypernatremia will give hypotonic fluids as discussed above at a low rate and monitor labs.  Last Echo Nov 2019 with EF 43-15%, grade I diastolic dysfunction  8. Hypertension: Home medications including lisinopril, Lasix, nebivolol and hydralazine on hold and concern for problem #1 and soft blood pressures.  9.  Diabetes mellitus type 2, insulin-dependent: Metformin on hold.  Sliding scale insulin for now.  Resumed on diet post EGD.  10.  Dementia: On Aricept, Namenda.  Currently oriented to person and place.  Also noted to be on Xanax for anxiety--probably best to avoid benzodiazepines in this elderly patient.  11.  Morbid obesity: No acute issues.  To follow-up PCP as outpatient    DVT prophylaxis: SCDs given GI bleed Code Status: Full code as of now, palliative care consulted for care goal discussion Family / Patient Communication: Discussed with daughter at bedside, family meeting this afternoon with palliative care team.  See below Disposition Plan:   Patient is from home prior to hospitalization.Patient lives with daughter [name] who is the primary caregiver but apparently unable to continue providing care for her.  Family met with palliative care team today and MOST form completed.  They are requesting skilled nursing facility placement with palliative care.  Family contemplating patient to be transitioned to hospice if her condition deteriorates further.  At this time they would like to keep DNR status, avoid aggressive interventions like surgery, intubations, dialysis, feeding tubes etc.  They are okay with nonaggressive interventions like IV fluids, blood transfusions, antibiotics etc.  Please refer to palliative care note for further details.  Social worker  looking into options.  Will obtain pre placement Covid screen.    LOS: 4 days    Time spent: 35 minutes    Guilford Shi, MD [facility] Pager in [place]  If 7PM-7AM, please contact night-coverage [email or web address] 02/26/2020, 4:38 PM MOST form

## 2020-02-26 NOTE — TOC Progression Note (Signed)
Transition of Care [facility]) - Progression Note    Patient Details  Name: Katherine [name] MRN: [id number] Date of Birth: 1928/04/04  Transition of Care [facility]) CM/SW Contact  Bartholomew Crews, RN Phone Number: [phone] 02/26/2020, 5:08 PM  Clinical Narrative:    Acknowledging consult for SNF placement. Spoke with daughter, [name], on the phone who had her sister on speaker phone. Patient has been to [facility] in 2016. Discussed faxing out patient information and will bring bed offers to family to select. Stated to start with [name], but if unavailable call down the contact list to other daughters. FL2 faxed out. No therapy evaluation at this time d/t patient unwilling to work with PT this afternoon. Will follow up with family for bed offers. TOC following for transition needs.    Expected Discharge Plan: [place] Barriers to Discharge: Continued Medical Work up, SNF Pending bed offer  Expected Discharge Plan and Services Expected Discharge Plan: [place] In-house Referral: Hospice / Palliative Care Discharge Planning Services: CM Consult Post Acute Care Choice: [place] Living arrangements for the past 2 months: Single Family Home                 DME Arranged: N/A DME Agency: NA       HH Arranged: NA HH Agency: NA         Social Determinants of Health (SDOH) Interventions    Readmission Risk Interventions No flowsheet data found.

## 2020-02-26 NOTE — Progress Notes (Signed)
PT Cancellation Note  Patient Details Name: Katherine [name] MRN: [id number] DOB: 12-Dec-1927   Cancelled Treatment:    Reason Eval/Treat Not Completed: Patient declined, no reason specified. PT attempts to initiate evaluation however pt becomes mildly agitated, reporting she is not weak and moves fine. Pt telling PT to "get up outta here". PT will attempt to return at a later time, pt may do better with family present due to cognitive deficits.   Zenaida Niece 02/26/2020, 3:25 PM

## 2020-02-27 DIAGNOSIS — E785 Hyperlipidemia, unspecified: Secondary | ICD-10-CM

## 2020-02-27 LAB — TYPE AND SCREEN
ABO/RH(D): O NEG
Antibody Screen: NEGATIVE
Unit division: 0
Unit division: 0

## 2020-02-27 LAB — GLUCOSE, CAPILLARY
Glucose-Capillary: 114 mg/dL — ABNORMAL HIGH (ref 70–99)
Glucose-Capillary: 190 mg/dL — ABNORMAL HIGH (ref 70–99)
Glucose-Capillary: 158 mg/dL — ABNORMAL HIGH (ref 70–99)

## 2020-02-27 LAB — CBC
HCT: 25.3 % — ABNORMAL LOW (ref 36.0–46.0)
Hemoglobin: 8.5 g/dL — ABNORMAL LOW (ref 12.0–15.0)
MCH: 29.8 pg (ref 26.0–34.0)
MCHC: 33.6 g/dL (ref 30.0–36.0)
MCV: 88.8 fL (ref 80.0–100.0)
Platelets: 213 10*3/uL (ref 150–400)
RBC: 2.85 MIL/uL — ABNORMAL LOW (ref 3.87–5.11)
RDW: 14.8 % (ref 11.5–15.5)
WBC: 8.2 10*3/uL (ref 4.0–10.5)
nRBC: 0 % (ref 0.0–0.2)

## 2020-02-27 LAB — BPAM RBC
Blood Product Expiration Date: 202103262359
Blood Product Expiration Date: 202104032359
ISSUE DATE / TIME: 202102231120
ISSUE DATE / TIME: 202103151838
Unit Type and Rh: 9500
Unit Type and Rh: 9500

## 2020-02-27 NOTE — Progress Notes (Signed)
Patient has yellow mucus secretion coming from vagina. MD notify.

## 2020-02-27 NOTE — Plan of Care (Signed)
  Problem: Activity: Goal: Risk for activity intolerance will decrease Outcome: Progressing   

## 2020-02-27 NOTE — Evaluation (Signed)
Physical Therapy Evaluation Patient Details Name: Katherine [name] MRN: [id number] DOB: [birth year] Today's Date: 02/27/2020   History of Present Illness  86 year old female admitted with GI bleed. PMH includes DM, HLD, HTN, s/p CABG, dementia, HF, obesity.  Clinical Impression  Patient received sleeping in bed, easily roused. Confused to location and situation. Agrees to PT assessment. Patient required max assist with bed mobility. Transfers sit to stand with mod assist from elevated bed. She was able to take 3-4 shuffle steps from bed to recliner with min assist and RW. Fatigued with this activity. Patient will continue to benefit from skilled PT to improve strength, safety and functional independence.     Follow Up Recommendations SNF    Equipment Recommendations  None recommended by PT    Recommendations for Other Services       Precautions / Restrictions Precautions Precautions: Fall Restrictions Weight Bearing Restrictions: No      Mobility  Bed Mobility Overal bed mobility: Needs Assistance Bed Mobility: Supine to Sit     Supine to sit: Max assist     General bed mobility comments: required max assist to get from supine to sit. Movement of LEs to edge of bed, pulling on pad to swivel around and assist with trunk to get up to seated position.  Transfers Overall transfer level: Needs assistance Equipment used: Rolling walker (2 wheeled) Transfers: Sit to/from Stand Sit to Stand: From elevated surface;Mod assist         General transfer comment: increased time needed, increased effort  Ambulation/Gait Ambulation/Gait assistance: Min assist Gait Distance (Feet): 3 Feet Assistive device: Rolling walker (2 wheeled) Gait Pattern/deviations: Step-to pattern;Trunk flexed;Decreased step length - right;Decreased step length - left;Shuffle Gait velocity: decreased   General Gait Details: patient only able to take a few steps from bed to recliner, some assist to  direct walker, cues to assist getting lined up with chair. Patient flopped down into recliner due to fatigue.  Stairs            Wheelchair Mobility    Modified Rankin (Stroke Patients Only)       Balance Overall balance assessment: Needs assistance Sitting-balance support: Feet supported Sitting balance-Leahy Scale: Fair     Standing balance support: Bilateral upper extremity supported;During functional activity Standing balance-Leahy Scale: Poor Standing balance comment: reliant on rw and external assist                             Pertinent Vitals/Pain Pain Assessment: No/denies pain    Home Living Family/patient expects to be discharged to:: Private residence Living Arrangements: Children Available Help at Discharge: Available PRN/intermittently;Personal care attendant Type of Home: House Home Access: Ramped entrance     Home Layout: One level Home Equipment: Environmental consultant - 2 wheels;Wheelchair - manual;Tub bench Additional Comments: info taken from prior admission. No family present for Eval. Patient has some confusion.    Prior Function Level of Independence: Needs assistance      ADL's / Homemaking Assistance Needed: patient reports she has caregiver every morning, unable to tell me details about what assist is provided.        Hand Dominance        Extremity/Trunk Assessment   Upper Extremity Assessment Upper Extremity Assessment: Generalized weakness    Lower Extremity Assessment Lower Extremity Assessment: Generalized weakness    Cervical / Trunk Assessment Cervical / Trunk Assessment: Kyphotic  Communication   Communication: HOH  Cognition  Arousal/Alertness: Lethargic;Awake/alert Behavior During Therapy: WFL for tasks assessed/performed Overall Cognitive Status: No family/caregiver present to determine baseline cognitive functioning                                 General Comments: Patient states she is in  [place], at hospital.      General Comments      Exercises     Assessment/Plan    PT Assessment Patient needs continued PT services  PT Problem List Decreased strength;Decreased mobility;Decreased activity tolerance;Decreased balance;Decreased cognition;Decreased safety awareness;Obesity       PT Treatment Interventions DME instruction;Therapeutic exercise;Gait training;Balance training;Functional mobility training;Therapeutic activities;Patient/family education    PT Goals (Current goals can be found in the Care Plan section)  Acute Rehab PT Goals Patient Stated Goal: none stated PT Goal Formulation: Patient unable to participate in goal setting Time For Goal Achievement: 03/12/20 Potential to Achieve Goals: Fair    Frequency Min 3X/week   Barriers to discharge Decreased caregiver support      Co-evaluation               AM-PAC PT "6 Clicks" Mobility  Outcome Measure Help needed turning from your back to your side while in a flat bed without using bedrails?: A Lot Help needed moving from lying on your back to sitting on the side of a flat bed without using bedrails?: Total Help needed moving to and from a bed to a chair (including a wheelchair)?: A Lot Help needed standing up from a chair using your arms (e.g., wheelchair or bedside chair)?: A Lot Help needed to walk in hospital room?: A Lot Help needed climbing 3-5 steps with a railing? : Total 6 Click Score: 10    End of Session Equipment Utilized During Treatment: Gait belt Activity Tolerance: Patient limited by fatigue Patient left: in chair;with call bell/phone within reach;Other (comment)(patient sitting on alarm pad, RN informed there is no alarm box in her room) Nurse Communication: Mobility status;Other (comment)(requires alarm box for chair alarm) PT Visit Diagnosis: Difficulty in walking, not elsewhere classified (R26.2);Other abnormalities of gait and mobility (R26.89);Muscle weakness (generalized)  (M62.81)    Time: 1120-1140 PT Time Calculation (min) (ACUTE ONLY): 20 min   Charges:   PT Evaluation $PT Eval Moderate Complexity: 1 Mod PT Treatments $Gait Training: 8-22 mins        Mandell Pangborn, PT, GCS 02/27/20,12:15 PM

## 2020-02-27 NOTE — Progress Notes (Addendum)
PROGRESS NOTE    Katherine [name]  WTU:[id number]  DOB: June 29, 1928  PCP: Celene Squibb, MD Admit date:02/22/2020  86 y.o.femalewith PMHCAD s/p CABG x 4, morbid obesity, HTN, HLD, DM II,and h/o severe AS s/p TAVRwho transferred to [facility] after presenting to [facility] with complaints of weakness/AMS and found to have Hgb 6.2 presumed secondary to GI bleed.  Per family, patient seemed less alert and weak. She can walk with a walker at baseline and went into the bathroom but unable to get up.  reports frequent UTI's and this is what she assumed was the problem again, she took her to [facility] in [place], [place] where she was found to have Hgb 6.2 and positive hemoccult POC. Patient was given 2 units of blood and transferred to [facility].  Daughter reported melena over past 2 days but reports that this sometimes happens. At baseline she can have limited conversations. She lives with [name] and they have aides come during the day to help care for her.  Hospital course: Patient admitted to [facility] for further evaluation and management with GI consultation.  Underwent EGD on 3/22 revealing large (4 to 5 cm), submucosal mass with with surface ulceration, likely the source of bleeding.  General surgery evaluated patient and did not feel candidate for aggressive interventions after discussing with family.  Cardiology evaluated and stated okay to hold off on Plavix indefinitely.  Subjective:Patient out of bed to chair and appears comfortable, saturating well on room air.  Daughter Neoma Laming and granddaughter at bedside.  Patient now awaiting SNF placement.  Objective: Vitals:   02/27/20 0552 02/27/20 0847 02/27/20 0954 02/27/20 1654  BP: (!) 164/63  (!) 165/54 (!) 131/53  Pulse: 63  74 62  Resp: 19  (!) 22 16  Temp: 98.2 F (36.8 C)  98.4 F (36.9 C) 98.8 F (37.1 C)  TempSrc: Oral  Oral Oral  SpO2: 100% 98% 98% 99%  Weight:      Height:        Intake/Output Summary (Last 24 hours) at 02/27/2020 1705 Last data  filed at 02/27/2020 0900 Gross per 24 hour  Intake 180 ml  Output 770 ml  Net -590 ml   Filed Weights   02/25/20 0601 02/26/20 0542  Weight: 81.2 kg 79 kg    Physical Examination:  General exam: Elderly, obese female who appears calm and comfortable  Respiratory system: Clear to auscultation. Respiratory effort normal. Cardiovascular system: S1 & S2 heard, RRR. No JVD, murmurs. No pedal edema. Gastrointestinal system: Abdomen is obese, nondistended, soft and nontender.  Central nervous system: Alert and oriented x2. No new focal neurological deficits. Extremities: No contractures, edema or joint deformities.  Skin: No rashes, lesions or ulcers Psychiatry: Judgement and insight appear subnormal. Mood & affect appropriate.   Data Reviewed: I have personally reviewed following labs and imaging studies  CBC: Recent Labs  Lab 02/23/20 2346 02/24/20 0538 02/25/20 0247 02/26/20 0555 02/27/20 0354  WBC 7.2 8.2 9.2 9.1 8.2  HGB 7.3* 7.8* 7.8* 8.4* 8.5*  HCT 22.1* 23.5* 23.7* 25.0* 25.3*  MCV 88.4 90.7 90.8 89.6 88.8  PLT 170 185 181 215 179   Basic Metabolic Panel: Recent Labs  Lab 02/23/20 0425 02/24/20 0538 02/25/20 0247 02/26/20 0555  NA 147* 150* 147* 145  K 3.9 3.8 3.6 3.6  CL 121* 118* 121* 114*  CO2 19* 23 21* 24  GLUCOSE 133* 159* 136* 148*  BUN 50* 30* 21 11  CREATININE 1.33* 1.18* 1.11* 1.02*  CALCIUM  9.4 9.9 9.6 9.6  MG 1.7  --   --   --    GFR: Estimated Creatinine Clearance: 35.7 mL/min (A) (by C-G formula based on SCr of 1.02 mg/dL (H)). Liver Function Tests: Recent Labs  Lab 02/23/20 0425 02/24/20 0538 02/25/20 0247  AST 22 20 18   ALT 17 16 16   ALKPHOS 46 43 45  BILITOT 0.7 0.2* 0.5  PROT 5.0* 5.0* 4.9*  ALBUMIN 2.7* 2.5* 2.5*   No results for input(s): LIPASE, AMYLASE in the last 168 hours. No results for input(s): AMMONIA in the last 168 hours. Coagulation Profile: No results for input(s): INR, PROTIME in the last 168 hours. Cardiac  Enzymes: No results for input(s): CKTOTAL, CKMB, CKMBINDEX, TROPONINI in the last 168 hours. BNP (last 3 results) No results for input(s): PROBNP in the last 8760 hours. HbA1C: No results for input(s): HGBA1C in the last 72 hours. CBG: Recent Labs  Lab 02/26/20 1653 02/26/20 2122 02/27/20 0636 02/27/20 1150 02/27/20 1655  GLUCAP 148* 141* 114* 190* 158*   Lipid Profile: No results for input(s): CHOL, HDL, LDLCALC, TRIG, CHOLHDL, LDLDIRECT in the last 72 hours. Thyroid Function Tests: No results for input(s): TSH, T4TOTAL, FREET4, T3FREE, THYROIDAB in the last 72 hours. Anemia Panel: No results for input(s): VITAMINB12, FOLATE, FERRITIN, TIBC, IRON, RETICCTPCT in the last 72 hours. Sepsis Labs: No results for input(s): PROCALCITON, LATICACIDVEN in the last 168 hours.  Recent Results (from the past 240 hour(s))  Culture, Urine     Status: Abnormal   Collection Time: 02/23/20 12:12 AM   Specimen: Urine, Catheterized  Result Value Ref Range Status   Specimen Description URINE, CATHETERIZED  Final   Special Requests   Final    NONE Performed at [facility], [address] [place], [place] [address]    Culture MULTIPLE SPECIES PRESENT, SUGGEST RECOLLECTION (A)  Final   Report Status 02/24/2020 FINAL  Final  SARS CORONAVIRUS 2 (TAT 6-24 HRS) Nasopharyngeal Nasopharyngeal Swab     Status: None   Collection Time: 02/26/20  5:18 PM   Specimen: Nasopharyngeal Swab  Result Value Ref Range Status   SARS Coronavirus 2 NEGATIVE NEGATIVE Final    Comment: (NOTE) SARS-CoV-2 target nucleic acids are NOT DETECTED. The SARS-CoV-2 RNA is generally detectable in upper and lower respiratory specimens during the acute phase of infection. Negative results do not preclude SARS-CoV-2 infection, do not rule out co-infections with other pathogens, and should not be used as the sole basis for treatment or other patient management decisions. Negative results must be combined with clinical  observations, patient history, and epidemiological information. The expected result is Negative. Fact Sheet for Patients: SugarRoll.be Fact Sheet for Healthcare Providers: https://www.woods-mathews.com/ This test is not yet approved or cleared by the [place] FDA and  has been authorized for detection and/or diagnosis of SARS-CoV-2 by FDA under an Emergency Use Authorization (EUA). This EUA will remain  in effect (meaning this test can be used) for the duration of the COVID-19 declaration under Section 56 4(b)(1) of the Act, 21 U.S.C. section 360bbb-3(b)(1), unless the authorization is terminated or revoked sooner. Performed at [facility], [place] [address], [place], [place] [address]       Radiology Studies: No results found.      Scheduled Meds: . acetaminophen  500 mg Oral BID  . ALPRAZolam  0.5 mg Oral QHS   And  . ALPRAZolam  0.25 mg Oral Daily  . atorvastatin  80 mg Oral Q  lunch  . Chlorhexidine Gluconate Cloth  6 each Topical Daily  . donepezil  10 mg Oral QHS  . fluticasone furoate-vilanterol  1 puff Inhalation Daily  . insulin aspart  0-9 Units Subcutaneous TID WC  . memantine  28 mg Oral Daily  . pantoprazole  40 mg Oral BID   Continuous Infusions:   Assessment/Plan:  1.  Upper GI bleed: Patient reported intermittent melena at home.  EGD revealed a large (4 to 5 cm), submucosal mass with with surface ulceration in the gastric fundus with no active bleeding but likely the source. There were also several tiny benign fundic gland type polyps present.  General surgery consulted for definitive management but not felt to be a candidate for aggressive interventions given advanced age and multiple comorbidities.  Plavix held and cardiology consulted to advise regarding the need to continue this medication.  Okay to hold Plavix per cardiology.  Continue Protonix twice daily. GI/GS have recommended conservative approach  and signed off  2.  Acute blood loss anemia: Due to GI bleed in the setting of Plavix therapy, gastric polyp/mass.  Patient had hemoglobin 6.2 on presentation and received 2 units of PRBC with improvement in hemoglobin today to greater than 8.  Plavix discontinued now.  3.  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  Underwent EGD today which showed large gastric fundus submucosal mass with surface ulceration.  Seen by general surgery and not felt to be a candidate for definitive management.  Per report, biopsy not obtained  4.  Acute cystitis/UTI: s/p Rocephin x3 days.  Received antibiotics at outside facility, urine culture here with multiple species likely contamination.  Patient does have history of recurrent UTIs.  5.  AKI, hypernatremia: Likely secondary to dehydration in the setting of problem #1.BUN 50/creatinine 1.33, baseline creatinine from 2019 was 0.86.  Improving with hydration,-Repeat BMP shows downtrend to creatinine 1.18 today.  Avoid nephrotoxins, renally dose medications.  Patient with sodium level at 147 on presentation and went up to 150-->improved now to 145 with ginger hypotonic fluids and with improved free water intake.  Lasix/lisinopril remain on hold.  Discontinued IV fluids.  Encourage free water intake.  6.  CAD s/p CABG x4, s/p TAVR in 2016: Well-known to cardiology service.  Seen by Dr. Angelena Form today and recommended to discontinue Plavix given concern for gastric mass/GI bleed and TAVR being done more than 5 years back.  He agreed with palliative care evaluation for care goals.  7.  Chronic diastolic CHF: Appears dehydrated.  Holding Lasix.  Given hypernatremia will give hypotonic fluids as discussed above at a low rate and monitor labs.  Last Echo Nov 2019 with EF 40-10%, grade I diastolic dysfunction  8. Hypertension: Home medications including  lisinopril, Lasix, nebivolol and hydralazine on hold and concern for problem #1 and soft blood pressures.  9.  Diabetes mellitus type 2, insulin-dependent: Metformin on hold.  Sliding scale insulin for now.  Resumed on diet post EGD.  10.  Dementia: On Aricept, Namenda.  Currently oriented to person and place.  Also noted to be on Xanax for anxiety--probably best to avoid benzodiazepines in this elderly patient.  11.  Morbid obesity: No acute issues.  To follow-up PCP as outpatient    DVT prophylaxis: SCDs given GI bleed Code Status: Full code as of now, palliative care consulted for care goal discussion Family / Patient Communication: Discussed with  daughter /grandaughter at bedside  Disposition Plan:   Patient is from home prior to hospitalization.Patient lives with daughter [name] who is the primary caregiver but apparently unable to continue providing care for her.  Family met with palliative care team 3/24 and MOST form completed.  They are requesting skilled nursing facility placement with palliative care.  Family contemplating patient to be transitioned to hospice if her condition deteriorates further.  At this time they would like to keep DNR status, avoid aggressive interventions like surgery, intubations, dialysis, feeding tubes etc.  They are okay with nonaggressive interventions like IV fluids, blood transfusions, antibiotics etc.  Please refer to palliative care note for further details.  Social worker looking into options.  Covid screen negative on 3/24.    LOS: 5 days    Time spent: 15 minutes    Guilford Shi, MD [facility] Pager in [place]  If 7PM-7AM, please contact night-coverage [email or web address] 02/27/2020, 5:05 PM MOST form

## 2020-02-28 LAB — GLUCOSE, CAPILLARY
Glucose-Capillary: 119 mg/dL — ABNORMAL HIGH (ref 70–99)
Glucose-Capillary: 179 mg/dL — ABNORMAL HIGH (ref 70–99)
Glucose-Capillary: 206 mg/dL — ABNORMAL HIGH (ref 70–99)
Glucose-Capillary: 195 mg/dL — ABNORMAL HIGH (ref 70–99)

## 2020-02-28 MED ORDER — DICYCLOMINE HCL 10 MG PO CAPS: 10.0000 mg | ORAL_CAPSULE | Freq: Three times a day (TID) | ORAL | Status: DC | PRN

## 2020-02-28 MED ORDER — ACETAMINOPHEN 325 MG PO TABS: 650.0000 mg | ORAL_TABLET | Freq: Four times a day (QID) | ORAL | Status: DC | PRN

## 2020-02-28 NOTE — Progress Notes (Signed)
PROGRESS NOTE    Katherine [name]  JME:[id number]  DOB: [birth year]  PCP: Celene Squibb, MD Admit date:02/22/2020  86 y.o.femalewith PMHCAD s/p CABG x 4, morbid obesity, HTN, HLD, DM II,and h/o severe AS s/p TAVRwho transferred to [facility] after presenting to [facility] with complaints of weakness/AMS and found to have Hgb 6.2 presumed secondary to GI bleed.  Per family, patient seemed less alert and weak. She can walk with a walker at baseline and went into the bathroom but unable to get up.  reports frequent UTI's and this is what she assumed was the problem again, she took her to [facility] in [place], [place] where she was found to have Hgb 6.2 and positive hemoccult POC. Patient was given 2 units of blood and transferred to [facility].  Daughter reported melena over past 2 days but reports that this sometimes happens. At baseline she can have limited conversations. She lives with [name] and they have aides come during the day to help care for her.  Hospital course: Patient admitted to [facility] for further evaluation and management with GI consultation.  Underwent EGD on 3/22 revealing large (4 to 5 cm), submucosal mass with with surface ulceration, likely the source of bleeding.  General surgery evaluated patient and did not feel candidate for aggressive interventions after discussing with family.  Cardiology evaluated and stated okay to hold off on Plavix indefinitely.  Subjective:Patient resting comfortably. Has had indwelling Foley while here--not sure if she was retaining. Patient now awaiting SNF placement.  Objective: Vitals:   02/28/20 0443 02/28/20 0754 02/28/20 1018 02/28/20 1643  BP: (!) 160/55  (!) 136/39 (!) 181/54  Pulse: (!) 59  71 64  Resp: 15  15 (!) 21  Temp: 98.8 F (37.1 C)  98.8 F (37.1 C) 98.4 F (36.9 C)  TempSrc: Oral  Oral Oral  SpO2: 100% 99% 100% 98%  Weight:      Height:        Intake/Output Summary (Last 24 hours) at 02/28/2020 1909 Last data filed at 02/28/2020 1300 Gross  per 24 hour  Intake 360 ml  Output 475 ml  Net -115 ml   Filed Weights   02/25/20 0601 02/26/20 0542 02/27/20 2038  Weight: 81.2 kg 79 kg 80.8 kg    Physical Examination:  General exam: Elderly, obese female who appears calm and comfortable  Respiratory system: Clear to auscultation. Respiratory effort normal. Cardiovascular system: S1 & S2 heard, RRR. No JVD, murmurs. No pedal edema. Gastrointestinal system: Abdomen is obese, nondistended, soft and nontender.  Central nervous system: Alert and oriented x2. No new focal neurological deficits. Extremities: No contractures, edema or joint deformities.  Skin: No rashes, lesions or ulcers Psychiatry: Judgement and insight appear subnormal. Mood & affect appropriate.   Data Reviewed: I have personally reviewed following labs and imaging studies  CBC: Recent Labs  Lab 02/23/20 2346 02/24/20 0538 02/25/20 0247 02/26/20 0555 02/27/20 0354  WBC 7.2 8.2 9.2 9.1 8.2  HGB 7.3* 7.8* 7.8* 8.4* 8.5*  HCT 22.1* 23.5* 23.7* 25.0* 25.3*  MCV 88.4 90.7 90.8 89.6 88.8  PLT 170 185 181 215 229   Basic Metabolic Panel: Recent Labs  Lab 02/23/20 0425 02/24/20 0538 02/25/20 0247 02/26/20 0555  NA 147* 150* 147* 145  K 3.9 3.8 3.6 3.6  CL 121* 118* 121* 114*  CO2 19* 23 21* 24  GLUCOSE 133* 159* 136* 148*  BUN 50* 30* 21 11  CREATININE 1.33* 1.18* 1.11* 1.02*  CALCIUM 9.4 9.9 9.6  9.6  MG 1.7  --   --   --    GFR: Estimated Creatinine Clearance: 36.1 mL/min (A) (by C-G formula based on SCr of 1.02 mg/dL (H)). Liver Function Tests: Recent Labs  Lab 02/23/20 0425 02/24/20 0538 02/25/20 0247  AST _0 ALT _1 ALKPHOS 46 43 45  BILITOT 0.7 0.2* 0.5  PROT 5.0* 5.0* 4.9*  ALBUMIN 2.7* 2.5* 2.5*   No results for input(s): LIPASE, AMYLASE in the last 168 hours. No results for input(s): AMMONIA in the last 168 hours. Coagulation Profile: No results for input(s): INR, PROTIME in the last 168 hours. Cardiac Enzymes: No  results for input(s): CKTOTAL, CKMB, CKMBINDEX, TROPONINI in the last 168 hours. BNP (last 3 results) No results for input(s): PROBNP in the last 8760 hours. HbA1C: No results for input(s): HGBA1C in the last 72 hours. CBG: Recent Labs  Lab 02/27/20 1150 02/27/20 1655 02/28/20 0634 02/28/20 1125 02/28/20 1641  GLUCAP 190* 158* 119* 179* 206*   Lipid Profile: No results for input(s): CHOL, HDL, LDLCALC, TRIG, CHOLHDL, LDLDIRECT in the last 72 hours. Thyroid Function Tests: No results for input(s): TSH, T4TOTAL, FREET4, T3FREE, THYROIDAB in the last 72 hours. Anemia Panel: No results for input(s): VITAMINB12, FOLATE, FERRITIN, TIBC, IRON, RETICCTPCT in the last 72 hours. Sepsis Labs: No results for input(s): PROCALCITON, LATICACIDVEN in the last 168 hours.  Recent Results (from the past 240 hour(s))  Culture, Urine     Status: Abnormal   Collection Time: 02/23/20 12:12 AM   Specimen: Urine, Catheterized  Result Value Ref Range Status   Specimen Description URINE, CATHETERIZED  Final   Special Requests   Final    NONE Performed at [facility], [address], [place], [place] [address]    Culture MULTIPLE SPECIES PRESENT, SUGGEST RECOLLECTION (A)  Final   Report Status 02/24/2020 FINAL  Final  SARS CORONAVIRUS 2 (TAT 6-24 HRS) Nasopharyngeal Nasopharyngeal Swab     Status: None   Collection Time: 02/26/20  5:18 PM   Specimen: Nasopharyngeal Swab  Result Value Ref Range Status   SARS Coronavirus 2 NEGATIVE NEGATIVE Final    Comment: (NOTE) SARS-CoV-2 target nucleic acids are NOT DETECTED. The SARS-CoV-2 RNA is generally detectable in upper and lower respiratory specimens during the acute phase of infection. Negative results do not preclude SARS-CoV-2 infection, do not rule out co-infections with other pathogens, and should not be used as the sole basis for treatment or other patient management decisions. Negative results must be combined with clinical  observations, patient history, and epidemiological information. The expected result is Negative. Fact Sheet for Patients: SugarRoll.be Fact Sheet for Healthcare Providers: https://www.woods-mathews.com/ This test is not yet approved or cleared by the [place] FDA and  has been authorized for detection and/or diagnosis of SARS-CoV-2 by FDA under an Emergency Use Authorization (EUA). This EUA will remain  in effect (meaning this test can be used) for the duration of the COVID-19 declaration under Section 56 4(b)(1) of the Act, 21 U.S.C. section 360bbb-3(b)(1), unless the authorization is terminated or revoked sooner. Performed at [facility], [place] [address], [place], [place] [address]       Radiology Studies: No results found.      Scheduled Meds: . acetaminophen  500 mg Oral BID  . ALPRAZolam  0.5 mg Oral QHS   And  . ALPRAZolam  0.25 mg Oral Daily  . atorvastatin  80 mg Oral Q lunch  .  Chlorhexidine Gluconate Cloth  6 each Topical Daily  . donepezil  10 mg Oral QHS  . fluticasone furoate-vilanterol  1 puff Inhalation Daily  . insulin aspart  0-9 Units Subcutaneous TID WC  . memantine  28 mg Oral Daily  . pantoprazole  40 mg Oral BID   Continuous Infusions:   Assessment/Plan:  1.  Upper GI bleed: Patient reported intermittent melena at home.  EGD revealed a large (4 to 5 cm), submucosal mass with with surface ulceration in the gastric fundus with no active bleeding but likely the source. There were also several tiny benign fundic gland type polyps present.  General surgery consulted for definitive management but not felt to be a candidate for aggressive interventions given advanced age and multiple comorbidities.  Plavix held and cardiology consulted to advise regarding the need to continue this medication.  Okay to hold Plavix per cardiology.  Continue Protonix twice daily. GI/GS have recommended conservative approach  and signed off  2.  Acute blood loss anemia: Due to GI bleed in the setting of Plavix therapy, gastric polyp/mass.  Patient had hemoglobin 6.2 on presentation and received 2 units of PRBC with improvement in hemoglobin today to greater than 8.  Plavix discontinued now.  3.  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  Underwent EGD today which showed large gastric fundus submucosal mass with surface ulceration.  Seen by general surgery and not felt to be a candidate for definitive management.  Per report, biopsy not obtained  4.  Acute cystitis/UTI: s/p Rocephin x3 days.  Received antibiotics at outside facility, urine culture here with multiple species likely contamination.  Patient does have history of recurrent UTIs.  5.  AKI, hypernatremia: Likely secondary to dehydration in the setting of problem #1.BUN 50/creatinine 1.33, baseline creatinine from 2019 was 0.86.  Improving with hydration,-Repeat BMP shows downtrend to creatinine 1.18 today.  Avoid nephrotoxins, renally dose medications.  Patient with sodium level at 147 on presentation and went up to 150-->improved now to 145 with ginger hypotonic fluids and with improved free water intake.  Lasix/lisinopril remain on hold.  Discontinued IV fluids.  Encourage free water intake. D/C Foley catheter and monitor for retention  6.  CAD s/p CABG x4, s/p TAVR in 2016: Well-known to cardiology service.  Seen by Dr. Angelena Form today and recommended to discontinue Plavix given concern for gastric mass/GI bleed and TAVR being done more than 5 years back.  He agreed with palliative care evaluation for care goals.  7.  Chronic diastolic CHF: Appears dehydrated.  Holding Lasix.  Given hypernatremia will give hypotonic fluids as discussed above at a low rate and monitor labs.  Last Echo Nov 2019 with EF 74-94%, grade I diastolic dysfunction  8.  Hypertension: Home medications including lisinopril, Lasix, nebivolol and hydralazine on hold and concern for problem #1 and soft blood pressures.  9.  Diabetes mellitus type 2, insulin-dependent: Metformin on hold.  Sliding scale insulin for now.  Resumed on diet post EGD.  10.  Dementia: On Aricept, Namenda.  Currently oriented to person and place.  Also noted to be on Xanax for anxiety--probably best to avoid benzodiazepines in this elderly patient.  11.  Morbid obesity: No acute issues.  To follow-up PCP as outpatient    DVT prophylaxis: SCDs given GI bleed Code Status: Full code as of now, palliative care consulted for care goal discussion Family /  Patient Communication: Discussed with daughter /grandaughter at bedside  Disposition Plan:   Patient is from home prior to hospitalization.Patient lives with daughter [name] who is the primary caregiver but apparently unable to continue providing care for her.  Family met with palliative care team 3/24 and MOST form completed.  They are requesting skilled nursing facility placement with palliative care.  Family contemplating patient to be transitioned to hospice if her condition deteriorates further.  At this time they would like to keep DNR status, avoid aggressive interventions like surgery, intubations, dialysis, feeding tubes etc.  They are okay with nonaggressive interventions like IV fluids, blood transfusions, antibiotics etc.  Please refer to palliative care note for further details.  Social worker looking into options.  Covid screen negative on 3/24.    LOS: 6 days    Time spent: 15 minutes    Guilford Shi, MD [facility] Pager in [place]  If 7PM-7AM, please contact night-coverage [email or web address] 02/28/2020, 7:09 PM MOST form

## 2020-02-28 NOTE — Plan of Care (Signed)
°  Problem: Coping: °Goal: Level of anxiety will decrease °Outcome: Progressing °  °

## 2020-02-28 NOTE — TOC Progression Note (Signed)
Transition of Care [facility]) - Progression Note    Patient Details  Name: Katherine [name] MRN: [id number] Date of Birth: 04/21/1928  Transition of Care [facility]) CM/SW Contact  Bartholomew Crews, RN Phone Number: [phone] 02/28/2020, 11:26 AM  Clinical Narrative:    Spoke with patient's daughter, [name], on the phone. She was not able to discuss bed offers at this time and requested that NCM call her sister, [name]. Spoke with [name] who is in process of going out of town, and requested that the bed offers be email so that she can review and share the list with her sisters. Bed offers emailed to [email or web address]. Will follow up with family choice. TOC following for transition needs.    Expected Discharge Plan: [place] Barriers to Discharge: Continued Medical Work up, SNF Pending bed offer  Expected Discharge Plan and Services Expected Discharge Plan: [place] In-house Referral: Hospice / Palliative Care Discharge Planning Services: CM Consult Post Acute Care Choice: [place] Living arrangements for the past 2 months: Single Family Home                 DME Arranged: N/A DME Agency: NA       HH Arranged: NA HH Agency: NA         Social Determinants of Health (SDOH) Interventions    Readmission Risk Interventions No flowsheet data found.

## 2020-02-29 ENCOUNTER — Other Ambulatory Visit: Payer: Self-pay

## 2020-02-29 DIAGNOSIS — F411 Generalized anxiety disorder: Secondary | ICD-10-CM

## 2020-02-29 DIAGNOSIS — N3000 Acute cystitis without hematuria: Secondary | ICD-10-CM

## 2020-02-29 DIAGNOSIS — I509 Heart failure, unspecified: Secondary | ICD-10-CM

## 2020-02-29 LAB — GLUCOSE, CAPILLARY
Glucose-Capillary: 149 mg/dL — ABNORMAL HIGH (ref 70–99)
Glucose-Capillary: 172 mg/dL — ABNORMAL HIGH (ref 70–99)
Glucose-Capillary: 162 mg/dL — ABNORMAL HIGH (ref 70–99)
Glucose-Capillary: 153 mg/dL — ABNORMAL HIGH (ref 70–99)

## 2020-02-29 MED ORDER — ADULT MULTIVITAMIN W/MINERALS CH
1.0000 | ORAL_TABLET | Freq: Every day | ORAL | Status: DC
  Administered 2020-02-29 – 2020-03-02 (×3): 1 via ORAL
  Filled 2020-02-29 (×3): qty 1

## 2020-02-29 MED ORDER — INSULIN DETEMIR 100 UNIT/ML ~~LOC~~ SOLN
12.0000 [IU] | Freq: Every day | SUBCUTANEOUS | Status: DC
  Administered 2020-02-29 – 2020-03-01 (×2): 12 [IU] via SUBCUTANEOUS
  Filled 2020-02-29 (×4): qty 0.12

## 2020-02-29 MED ORDER — PANTOPRAZOLE SODIUM 40 MG PO TBEC: 40.0000 mg | DELAYED_RELEASE_TABLET | Freq: Every day | ORAL | Status: DC

## 2020-02-29 MED ORDER — FUROSEMIDE 40 MG PO TABS
40.0000 mg | ORAL_TABLET | Freq: Every day | ORAL | Status: DC
  Administered 2020-02-29 – 2020-03-02 (×3): 40 mg via ORAL
  Filled 2020-02-29 (×3): qty 1

## 2020-02-29 MED ORDER — POTASSIUM CHLORIDE CRYS ER 20 MEQ PO TBCR
20.0000 meq | EXTENDED_RELEASE_TABLET | Freq: Two times a day (BID) | ORAL | Status: DC
  Administered 2020-03-01 – 2020-03-02 (×4): 20 meq via ORAL
  Filled 2020-02-29 (×4): qty 1

## 2020-02-29 MED ORDER — LOPERAMIDE HCL 2 MG PO CAPS: 2.0000 mg | ORAL_CAPSULE | Freq: Two times a day (BID) | ORAL | Status: DC | PRN

## 2020-02-29 MED ORDER — NEBIVOLOL HCL 10 MG PO TABS
10.0000 mg | ORAL_TABLET | Freq: Every day | ORAL | Status: DC
  Administered 2020-02-29 – 2020-03-02 (×3): 10 mg via ORAL
  Filled 2020-02-29 (×3): qty 1

## 2020-02-29 MED ORDER — LISINOPRIL 10 MG PO TABS
10.0000 mg | ORAL_TABLET | Freq: Every day | ORAL | Status: DC
  Administered 2020-02-29 – 2020-03-02 (×3): 10 mg via ORAL
  Filled 2020-02-29 (×3): qty 1

## 2020-02-29 MED ORDER — CLOPIDOGREL BISULFATE 75 MG PO TABS
75.0000 mg | ORAL_TABLET | Freq: Every day | ORAL | Status: DC
  Administered 2020-03-01 – 2020-03-02 (×2): 75 mg via ORAL
  Filled 2020-02-29 (×2): qty 1

## 2020-02-29 MED ORDER — HYDRALAZINE HCL 50 MG PO TABS
50.0000 mg | ORAL_TABLET | Freq: Three times a day (TID) | ORAL | Status: DC
  Administered 2020-02-29 – 2020-03-02 (×6): 50 mg via ORAL
  Filled 2020-02-29 (×6): qty 1

## 2020-02-29 NOTE — Progress Notes (Signed)
PROGRESS NOTE  Katherine [name] QMG:[id number] DOB: 01-13-1928 DOA: 02/22/2020 PCP: Celene Squibb, MD  Brief History   86 y.o.femalewith PMHCAD s/p CABG x 4, morbid obesity, HTN, HLD, DM II,and h/o severe AS s/p TAVRwho transferred to [facility] after presenting to [facility] with complaints of weakness/AMS and found to have Hgb 6.2 presumed secondary to GI bleed.  Per family, patient seemed less alert and weak. She can walk with a walker at baseline and went into the bathroom but unable to get up.  reports frequent UTI's and this is what she assumed was the problem again, she took her to [facility] in [place], [place] where she was found to have Hgb 6.2 and positive hemoccult POC. Patient was given 2 units of blood and transferred to [facility].  Daughter reported melena over past 2 days but reports that this sometimes happens. At baseline she can have limited conversations. She lives with [name] and they have aides come during the day to help care for her.  Hospital course: Patient admitted to [facility] for further evaluation and management with GI consultation.  Underwent EGD on 3/22 revealing large (4 to 5 cm), submucosal mass with with surface ulceration, likely the source of bleeding.  General surgery evaluated patient and did not feel candidate for aggressive interventions after discussing with family.  Cardiology evaluated and stated okay to hold off on Plavix indefinitely.  She is medically cleared for discharge and is awaiting SNF placement.  Consultants  . Palliative care . Cardiology . General Surgery . Gastroenterology  Procedures  . EGD (3/22) - Large submucosal mass with surface ulceration.  Antibiotics   Anti-infectives (From admission, onward)   Start     Dose/Rate Route Frequency Ordered Stop   02/23/20 1600  cefTRIAXone (ROCEPHIN) 1 g in sodium chloride 0.9 % 100 mL IVPB     1 g 200 mL/hr over 30 Minutes Intravenous Every 24 hours 02/22/20 2029 02/24/20 1701    .  Subjective  The patient  is resting comfortably. No new complaints.  Objective   Vitals:  Vitals:   02/29/20 0941 02/29/20 1727  BP: (!) 169/64 (!) 192/82  Pulse: 97 97  Resp: 18 18  Temp: 97.8 F (36.6 C) 98.4 F (36.9 C)  SpO2: 99% 98%   Exam:  Constitutional:  . The patient is awake, alert, and oriented x 3. No acute distress. Respiratory:  . No increased work of breathing. . No wheezes, rales, or rhonchi . No tactile fremitus Cardiovascular:  . Regular rate and rhythm . No murmurs, ectopy, or gallups. . No lateral PMI. No thrills. Abdomen:  . Abdomen is soft, non-tender, non-distended . No hernias, masses, or organomegaly . Normoactive bowel sounds.  Musculoskeletal:  . No cyanosis, clubbing, or edema Skin:  . No rashes, lesions, ulcers . palpation of skin: no induration or nodules Neurologic:  . CN 2-12 intact . Sensation all 4 extremities intact Psychiatric:  . Mental status o Mood, affect appropriate o Orientation to person, place, time  . judgment and insight appear intact I have personally reviewed the following:   Today's Data  . Vitals  Scheduled Meds: . acetaminophen  500 mg Oral BID  . ALPRAZolam  0.5 mg Oral QHS   And  . ALPRAZolam  0.25 mg Oral Daily  . atorvastatin  80 mg Oral Q lunch  . Chlorhexidine Gluconate Cloth  6 each Topical Daily  . [START ON 03/01/2020] clopidogrel  75 mg Oral Q breakfast  . donepezil  10 mg  Oral QHS  . fluticasone furoate-vilanterol  1 puff Inhalation Daily  . furosemide  40 mg Oral Daily  . hydrALAZINE  50 mg Oral Q8H  . insulin aspart  0-9 Units Subcutaneous TID WC  . insulin detemir  12 Units Subcutaneous QHS  . lisinopril  10 mg Oral Daily  . memantine  28 mg Oral Daily  . multivitamin with minerals  1 tablet Oral Daily  . nebivolol  10 mg Oral Daily  . pantoprazole  40 mg Oral BID  . [START ON 03/01/2020] potassium chloride SA  20 mEq Oral BID WC   Continuous Infusions:  Principal Problem:   GI bleed Active Problems:    Diabetes mellitus type 2, uncontrolled (HCC)   Hyperlipidemia   Anxiety state   Essential hypertension   Coronary atherosclerosis   S/P CABG x 4   Morbid obesity (HCC)   S/p TAVR (transcatheter aortic valve replacement), bioprosthetic   Chronic diastolic heart failure (HCC)   Urinary tract infection   Chronic diastolic CHF (congestive heart failure) ([place])   Advanced dementia (HCC)   Heme positive stool   Esophageal ring   Palliative care by specialist   Goals of care, counseling/discussion   LOS: 7 days   A & P  Upper GI bleed: Patient reported intermittent melena at home.  EGD revealed a large (4 to 5 cm), submucosal mass with with surface ulceration in the gastric fundus with no active bleeding but likely the source. There were also several tiny benign fundic gland type polyps present.  General surgery consulted for definitive management but not felt to be a candidate for aggressive interventions given advanced age and multiple comorbidities.  Plavix held and cardiology consulted to advise regarding the need to continue this medication.  Okay to hold Plavix indefinitely per cardiology.  Continue Protonix twice daily. GI/GS have recommended conservative approach and signed off  Acute blood loss anemia: Due to GI bleed in the setting of Plavix therapy, gastric polyp/mass.  Patient had hemoglobin 6.2 on presentation and received 2 units of PRBC with improvement in hemoglobin today to greater than 8.  Plavix discontinued now.  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 She is unsure what kind of treatment she received for this.  Underwent EGD today which showed large gastric fundus submucosal mass with surface ulceration.  Seen by general surgery and not felt to be a candidate for definitive management.  Per report, biopsy not obtained.  Acute cystitis/UTI: s/p Rocephin x3 days.  Received antibiotics at outside facility,  urine culture here with multiple species likely contamination.  Patient does have history of recurrent UTIs.  5.AKI, hypernatremia: Likely secondary to dehydration in the setting of problem #1.BUN 50/creatinine 1.33,baseline creatinine from 2019 was 0.86.  Improving with hydration,-Repeat BMP shows downtrend to creatinine 1.18 today.  Avoid nephrotoxins, renally dose medications.  Patient with sodium level at 147 on presentation and went up to 150-->improved now to 145 with ginger hypotonic fluids and with improved free water intake.  Lasix/lisinopril remain on hold.  Discontinued IV fluids.  Encourage free water intake. Foley catheter has been discontinued. Patient has shown no signs of urinary retention. Monitor.  CAD s/p CABG x4, s/p TAVR in 2016: Well-known to cardiology service.  Seen by Dr. Angelena Form today and recommended to discontinue Plavix given concern for gastric mass/GI bleed and TAVR being done more than 5 years back.  He agreed with palliative care evaluation for care goals.  Chronic diastolic CHF: Appears dehydrated.  Holding Lasix.  Given hypernatremia will give hypotonic fluids as discussed above at a low rate and monitor labs.  Last Echo Nov 2019 with EF 72-90%, grade I diastolic dysfunction  Hypertension: Home medications including lisinopril, Lasix, nebivolol and hydralazine have been restarted due to high blood pressures today. Monitor.  Diabetes mellitus type 2, insulin-dependent: Metformin on hold.  Sliding scale insulin for now.  Resumed on diet post EGD.  Dementia: On Aricept, Namenda.  Currently oriented to person and place.  Also noted to be on Xanax for anxiety--probably best to avoid benzodiazepines in this elderly patient.  Morbid obesity: No acute issues.  To follow-up PCP as outpatient  I have seen and examined this patient myself. I have spent 32 minutes in her evaluation and care.  DVT prophylaxis: SCDs given GI bleed Code Status: Full code as of now,  palliative care consulted for care goal discussion Family / Patient Communication: None available. Disposition Plan:   Patient is from home prior to hospitalization.Patient lives with daughter [name] who is the primary caregiver but apparently unable to continue providing care for her.  Family met with palliative care team 3/24 and MOST form completed.  They are requesting skilled nursing facility placement with palliative care.  Family contemplating patient to be transitioned to hospice if her condition deteriorates further.  At this time they would like to keep DNR status, avoid aggressive interventions like surgery, intubations, dialysis, feeding tubes etc.  They are okay with nonaggressive interventions like IV fluids, blood transfusions, antibiotics etc.  Please refer to palliative care note for further details.  Social worker looking into options.  Covid screen negative on 3/24.   Silvano Garofano, DO [facility] Direct contact: see [email or web address]  7PM-7AM contact night coverage as above 02/29/2020, 6:49 PM  LOS: 7 days

## 2020-02-29 NOTE — Plan of Care (Signed)
  Problem: Activity: Goal: Risk for activity intolerance will decrease Outcome: Progressing   Problem: Nutrition: Goal: Adequate nutrition will be maintained Outcome: Progressing   

## 2020-02-29 NOTE — Social Work (Addendum)
Update CSW contacted patient's daughter [name] to follow-up on bed offers. Patient's daughter reported the family looked at the list but has not made a decision. CSW agreed to follow-up with patient's daugther [name] on Sunday, per request of [name].  CSW made telephone call to patient's daughter [name] [phone] to follow-up on bed offers. CSW left a voicemail. CSW will continue to follow & assist with discharge planning needs.  Criss Alvine, CSW

## 2020-03-01 LAB — CBC WITH DIFFERENTIAL/PLATELET
Abs Immature Granulocytes: 0.04 10*3/uL (ref 0.00–0.07)
Basophils Absolute: 0 10*3/uL (ref 0.0–0.1)
Basophils Relative: 0 %
Eosinophils Absolute: 0.1 10*3/uL (ref 0.0–0.5)
Eosinophils Relative: 1 %
HCT: 25.6 % — ABNORMAL LOW (ref 36.0–46.0)
Hemoglobin: 8.7 g/dL — ABNORMAL LOW (ref 12.0–15.0)
Immature Granulocytes: 1 %
Lymphocytes Relative: 18 %
Lymphs Abs: 1.5 10*3/uL (ref 0.7–4.0)
MCH: 29.5 pg (ref 26.0–34.0)
MCHC: 34 g/dL (ref 30.0–36.0)
MCV: 86.8 fL (ref 80.0–100.0)
Monocytes Absolute: 0.6 10*3/uL (ref 0.1–1.0)
Monocytes Relative: 7 %
Neutro Abs: 6.1 10*3/uL (ref 1.7–7.7)
Neutrophils Relative %: 73 %
Platelets: 294 10*3/uL (ref 150–400)
RBC: 2.95 MIL/uL — ABNORMAL LOW (ref 3.87–5.11)
RDW: 14.5 % (ref 11.5–15.5)
WBC: 8.3 10*3/uL (ref 4.0–10.5)
nRBC: 0 % (ref 0.0–0.2)

## 2020-03-01 LAB — SARS CORONAVIRUS 2 (TAT 6-24 HRS): SARS Coronavirus 2: NEGATIVE

## 2020-03-01 LAB — GLUCOSE, CAPILLARY
Glucose-Capillary: 144 mg/dL — ABNORMAL HIGH (ref 70–99)
Glucose-Capillary: 128 mg/dL — ABNORMAL HIGH (ref 70–99)
Glucose-Capillary: 140 mg/dL — ABNORMAL HIGH (ref 70–99)
Glucose-Capillary: 150 mg/dL — ABNORMAL HIGH (ref 70–99)

## 2020-03-01 LAB — BASIC METABOLIC PANEL
Anion gap: 10 (ref 5–15)
BUN: 9 mg/dL (ref 8–23)
CO2: 25 mmol/L (ref 22–32)
Calcium: 9.6 mg/dL (ref 8.9–10.3)
Chloride: 106 mmol/L (ref 98–111)
Creatinine, Ser: 1.06 mg/dL — ABNORMAL HIGH (ref 0.44–1.00)
GFR calc Af Amer: 54 mL/min — ABNORMAL LOW (ref >60)
GFR calc non Af Amer: 46 mL/min — ABNORMAL LOW (ref >60)
Glucose, Bld: 157 mg/dL — ABNORMAL HIGH (ref 70–99)
Potassium: 3.5 mmol/L (ref 3.5–5.1)
Sodium: 141 mmol/L (ref 135–145)

## 2020-03-01 NOTE — Progress Notes (Signed)
PROGRESS NOTE  CYDNE GRAHN TDD:[id number] DOB: 20-Sep-1928 DOA: 02/22/2020 PCP: Celene Squibb, MD  Brief History   86 y.o.femalewith PMHCAD s/p CABG x 4, morbid obesity, HTN, HLD, DM II,and h/o severe AS s/p TAVRwho transferred to [facility] after presenting to [facility] with complaints of weakness/AMS and found to have Hgb 6.2 presumed secondary to GI bleed.  Per family, patient seemed less alert and weak. She can walk with a walker at baseline and went into the bathroom but unable to get up.  reports frequent UTI's and this is what she assumed was the problem again, she took her to [facility] in [place], [place] where she was found to have Hgb 6.2 and positive hemoccult POC. Patient was given 2 units of blood and transferred to [facility].  Daughter reported melena over past 2 days but reports that this sometimes happens. At baseline she can have limited conversations. She lives with [name] and they have aides come during the day to help care for her.  Hospital course: Patient admitted to [facility] for further evaluation and management with GI consultation.  Underwent EGD on 3/22 revealing large (4 to 5 cm), submucosal mass with with surface ulceration, likely the source of bleeding.  General surgery evaluated patient and did not feel candidate for aggressive interventions after discussing with family.  Cardiology evaluated and stated okay to hold off on Plavix indefinitely. She has been restarted on her home antihypertensives.  She is medically cleared for discharge and is awaiting SNF placement.  Consultants  . Palliative care . Cardiology . General Surgery . Gastroenterology  Procedures  . EGD (3/22) - Large submucosal mass with surface ulceration.  Antibiotics   Anti-infectives (From admission, onward)   Start     Dose/Rate Route Frequency Ordered Stop   02/23/20 1600  cefTRIAXone (ROCEPHIN) 1 g in sodium chloride 0.9 % 100 mL IVPB     1 g 200 mL/hr over 30 Minutes Intravenous Every 24 hours  02/22/20 2029 02/24/20 1701     Subjective  The patient is resting comfortably. No new complaints.  Objective   Vitals:  Vitals:   03/01/20 1343 03/01/20 1613  BP: (!) 120/43 (!) 120/44  Pulse: (!) 56 (!) 56  Resp: 16 18  Temp: 98.5 F (36.9 C) 98.4 F (36.9 C)  SpO2: 99% 94%   Exam:  Constitutional:  . The patient is awake, alert, and oriented x 3. No acute distress. Respiratory:  . No increased work of breathing. . No wheezes, rales, or rhonchi . No tactile fremitus Cardiovascular:  . Regular rate and rhythm . No murmurs, ectopy, or gallups. . No lateral PMI. No thrills. Abdomen:  . Abdomen is soft, non-tender, non-distended . No hernias, masses, or organomegaly . Normoactive bowel sounds.  Musculoskeletal:  . No cyanosis, clubbing, or edema Skin:  . No rashes, lesions, ulcers . palpation of skin: no induration or nodules Neurologic:  . CN 2-12 intact . Sensation all 4 extremities intact Psychiatric:  . Mental status o Mood, affect appropriate o Orientation to person, place, time  . judgment and insight appear intact I have personally reviewed the following:   Today's Data  . Vitals  Scheduled Meds: . acetaminophen  500 mg Oral BID  . ALPRAZolam  0.5 mg Oral QHS   And  . ALPRAZolam  0.25 mg Oral Daily  . atorvastatin  80 mg Oral Q lunch  . Chlorhexidine Gluconate Cloth  6 each Topical Daily  . clopidogrel  75 mg Oral Q breakfast  .  donepezil  10 mg Oral QHS  . fluticasone furoate-vilanterol  1 puff Inhalation Daily  . furosemide  40 mg Oral Daily  . hydrALAZINE  50 mg Oral Q8H  . insulin aspart  0-9 Units Subcutaneous TID WC  . insulin detemir  12 Units Subcutaneous QHS  . lisinopril  10 mg Oral Daily  . memantine  28 mg Oral Daily  . multivitamin with minerals  1 tablet Oral Daily  . nebivolol  10 mg Oral Daily  . pantoprazole  40 mg Oral BID  . potassium chloride SA  20 mEq Oral BID WC   Continuous Infusions:  Principal Problem:   GI  bleed Active Problems:   Diabetes mellitus type 2, uncontrolled (HCC)   Hyperlipidemia   Anxiety state   Essential hypertension   Coronary atherosclerosis   S/P CABG x 4   Morbid obesity (HCC)   S/p TAVR (transcatheter aortic valve replacement), bioprosthetic   Chronic diastolic heart failure (HCC)   Urinary tract infection   Chronic diastolic CHF (congestive heart failure) (HCC)   Advanced dementia (HCC)   Heme positive stool   Esophageal ring   Palliative care by specialist   Goals of care, counseling/discussion   LOS: 8 days   A & P  Upper GI bleed: Patient reported intermittent melena at home.  EGD revealed a large (4 to 5 cm), submucosal mass with with surface ulceration in the gastric fundus with no active bleeding but likely the source. There were also several tiny benign fundic gland type polyps present.  General surgery consulted for definitive management but not felt to be a candidate for aggressive interventions given advanced age and multiple comorbidities.  Plavix held and cardiology consulted to advise regarding the need to continue this medication.  Okay to hold Plavix indefinitely per cardiology.  Continue Protonix twice daily. GI/GS have recommended conservative approach and signed off  Acute blood loss anemia: Due to GI bleed in the setting of Plavix therapy, gastric polyp/mass.  Patient had hemoglobin 6.2 on presentation and received 2 units of PRBC with improvement in hemoglobin today to greater than 8.  Plavix discontinued now. Hemoglobin remains stable at 8.7 today.  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 She is unsure what kind of treatment she received for this.  Underwent EGD today which showed large gastric fundus submucosal mass with surface ulceration.  Seen by general surgery and not felt to be a candidate for definitive management.  Per report, biopsy not obtained.  Acute cystitis/UTI:  s/p Rocephin x3 days.  Received antibiotics at outside facility, urine culture here with multiple species likely contamination.  Patient does have history of recurrent UTIs.  AKI, hypernatremia: Likely secondary to dehydration in the setting of problem #1.BUN 50/creatinine 1.33,baseline creatinine from 2019 was 0.86.  Improving with hydration,-Repeat BMP shows downtrend to creatinine 1.18 today.  Avoid nephrotoxins, renally dose medications.  Patient with sodium level at 147 on presentation and went up to 150-->improved now to 145 with ginger hypotonic fluids and with improved free water intake.  Lasix/lisinopril remain on hold.  Discontinued IV fluids.  Encourage free water intake. Foley catheter has been discontinued. Patient has shown no signs of urinary retention. Monitor.  CAD s/p CABG x4, s/p TAVR in 2016: Well-known to cardiology service.  Seen by Dr. Angelena Form today and recommended to discontinue Plavix given concern for gastric mass/GI bleed and TAVR being done more than 5 years back.  He agreed  with palliative care evaluation for care goals.  Chronic diastolic CHF: Appears dehydrated.  Holding Lasix.  Given hypernatremia will give hypotonic fluids as discussed above at a low rate and monitor labs.  Last Echo Nov 2019 with EF 70-17%, grade I diastolic dysfunction  Hypertension: Home medications including lisinopril, Lasix, nebivolol and hydralazine have been restarted due to high blood pressures today. Blood pressures are well controlled today.  Diabetes mellitus type 2, insulin-dependent: Metformin on hold. Sliding scale insulin for now. Resumed on diet post EGD.  Dementia: On Aricept, Namenda.  Currently oriented to person and place.  Also noted to be on Xanax for anxiety--probably best to avoid benzodiazepines in this elderly patient.  Morbid obesity: No acute issues.  To follow-up PCP as outpatient  I have seen and examined this patient myself. I have spent 34 minutes in her  evaluation and care.  DVT prophylaxis: SCDs given GI bleed Code Status: Full code as of now, palliative care consulted for care goal discussion Family / Patient Communication: None available. Disposition Plan:   Patient is from home prior to hospitalization.Patient lives with daughter [name] who is the primary caregiver but apparently unable to continue providing care for her.  Family met with palliative care team 3/24 and MOST form completed.  They are requesting skilled nursing facility placement with palliative care.  Family contemplating patient to be transitioned to hospice if her condition deteriorates further.  At this time they would like to keep DNR status, avoid aggressive interventions like surgery, intubations, dialysis, feeding tubes etc.  They are okay with nonaggressive interventions like IV fluids, blood transfusions, antibiotics etc.  Please refer to palliative care note for further details.  Social worker looking into options.  Covid screen has been repeated. Plan is for the patient to go to SNF on 03/02/2020.   Mikhail Hallenbeck, DO [facility] Direct contact: see [email or web address]  7PM-7AM contact night coverage as above 02/29/2020, 6:49 PM  LOS: 7 days

## 2020-03-01 NOTE — TOC Progression Note (Addendum)
Transition of Care [facility]) - Progression Note    Patient Details  Name: Katherine [name] MRN: [id number] Date of Birth: 04-18-1928  Transition of Care [facility]) CM/SW [place], [place] Phone Number: 03/01/2020, 11:09 AM  Clinical Narrative:    Update: CSW spoke wit patient's daughter [name] and explained patient is medically stable. CSW expressed once patient's covid test comes back, patient will be cleared for discharge and an appeal will need to be made to keep patient hospitalized any longer. Patient's daughter expressed she tried to make contact with facilities Friday and the weekend and has not been contacted yet. CSW expressed understanding and reiterated patient's medical stability. CSW will continue to follow and assist with discharge planning needs.  CSW made telephone call to patient's daughter [name] to follow-up on bed offers. [name] informed CSW she is out of town and has not had a chance to choose a facility. CSW informed patient's daughter [name] that patient is medically stable to go to a facility and could possibly be discharged today. Patient's daughter stated CSW can call daughter Hilda Blades to get a decision.  CSW made telephone call to patient's daughter Hilda Blades. There was no answer and mailbox is full. CSW called daughter [name] back and informed her Hilda Blades was not able to be contacted. [name] agreed to have [employer]  CSW requested a rapid covid test from nurse. CSW    Expected Discharge Plan: Skilled Nursing Facility Barriers to Discharge: Continued Medical Work up, SNF Pending bed offer  Expected Discharge Plan and Services Expected Discharge Plan: [place] In-house Referral: Hospice / Palliative Care Discharge Planning Services: CM Consult Post Acute Care Choice: [place] Living arrangements for the past 2 months: Single Family Home                 DME Arranged: N/A DME Agency: NA       HH Arranged: NA HH Agency: NA          Social Determinants of Health (SDOH) Interventions    Readmission Risk Interventions No flowsheet data found.

## 2020-03-01 NOTE — Plan of Care (Signed)
  Problem: Nutrition: Goal: Adequate nutrition will be maintained Outcome: Progressing   Problem: Safety: Goal: Ability to remain free from injury will improve Outcome: Progressing   

## 2020-03-02 DIAGNOSIS — R0902 Hypoxemia: Secondary | ICD-10-CM | POA: Diagnosis not present

## 2020-03-02 DIAGNOSIS — M6281 Muscle weakness (generalized): Secondary | ICD-10-CM | POA: Diagnosis not present

## 2020-03-02 DIAGNOSIS — F0391 Unspecified dementia with behavioral disturbance: Secondary | ICD-10-CM | POA: Diagnosis not present

## 2020-03-02 DIAGNOSIS — F0390 Unspecified dementia without behavioral disturbance: Secondary | ICD-10-CM | POA: Diagnosis present

## 2020-03-02 DIAGNOSIS — I130 Hypertensive heart and chronic kidney disease with heart failure and stage 1 through stage 4 chronic kidney disease, or unspecified chronic kidney disease: Secondary | ICD-10-CM | POA: Diagnosis not present

## 2020-03-02 DIAGNOSIS — F5105 Insomnia due to other mental disorder: Secondary | ICD-10-CM | POA: Diagnosis not present

## 2020-03-02 DIAGNOSIS — I25119 Atherosclerotic heart disease of native coronary artery with unspecified angina pectoris: Secondary | ICD-10-CM | POA: Diagnosis not present

## 2020-03-02 DIAGNOSIS — Z7189 Other specified counseling: Secondary | ICD-10-CM | POA: Diagnosis not present

## 2020-03-02 DIAGNOSIS — R0602 Shortness of breath: Secondary | ICD-10-CM | POA: Diagnosis not present

## 2020-03-02 DIAGNOSIS — F0151 Vascular dementia with behavioral disturbance: Secondary | ICD-10-CM | POA: Diagnosis not present

## 2020-03-02 DIAGNOSIS — F419 Anxiety disorder, unspecified: Secondary | ICD-10-CM | POA: Diagnosis present

## 2020-03-02 DIAGNOSIS — R404 Transient alteration of awareness: Secondary | ICD-10-CM | POA: Diagnosis not present

## 2020-03-02 DIAGNOSIS — R52 Pain, unspecified: Secondary | ICD-10-CM | POA: Diagnosis not present

## 2020-03-02 DIAGNOSIS — D490 Neoplasm of unspecified behavior of digestive system: Secondary | ICD-10-CM | POA: Diagnosis not present

## 2020-03-02 DIAGNOSIS — D649 Anemia, unspecified: Secondary | ICD-10-CM | POA: Diagnosis not present

## 2020-03-02 DIAGNOSIS — I509 Heart failure, unspecified: Secondary | ICD-10-CM | POA: Diagnosis not present

## 2020-03-02 DIAGNOSIS — K449 Diaphragmatic hernia without obstruction or gangrene: Secondary | ICD-10-CM | POA: Diagnosis present

## 2020-03-02 DIAGNOSIS — R197 Diarrhea, unspecified: Secondary | ICD-10-CM | POA: Diagnosis not present

## 2020-03-02 DIAGNOSIS — E878 Other disorders of electrolyte and fluid balance, not elsewhere classified: Secondary | ICD-10-CM | POA: Diagnosis present

## 2020-03-02 DIAGNOSIS — I2510 Atherosclerotic heart disease of native coronary artery without angina pectoris: Secondary | ICD-10-CM | POA: Diagnosis present

## 2020-03-02 DIAGNOSIS — F339 Major depressive disorder, recurrent, unspecified: Secondary | ICD-10-CM | POA: Diagnosis not present

## 2020-03-02 DIAGNOSIS — I5032 Chronic diastolic (congestive) heart failure: Secondary | ICD-10-CM | POA: Diagnosis present

## 2020-03-02 DIAGNOSIS — R6520 Severe sepsis without septic shock: Secondary | ICD-10-CM | POA: Diagnosis present

## 2020-03-02 DIAGNOSIS — Z515 Encounter for palliative care: Secondary | ICD-10-CM | POA: Diagnosis not present

## 2020-03-02 DIAGNOSIS — K922 Gastrointestinal hemorrhage, unspecified: Secondary | ICD-10-CM | POA: Diagnosis not present

## 2020-03-02 DIAGNOSIS — R2681 Unsteadiness on feet: Secondary | ICD-10-CM | POA: Diagnosis not present

## 2020-03-02 DIAGNOSIS — M1990 Unspecified osteoarthritis, unspecified site: Secondary | ICD-10-CM | POA: Diagnosis not present

## 2020-03-02 DIAGNOSIS — I959 Hypotension, unspecified: Secondary | ICD-10-CM | POA: Diagnosis not present

## 2020-03-02 DIAGNOSIS — K319 Disease of stomach and duodenum, unspecified: Secondary | ICD-10-CM | POA: Diagnosis present

## 2020-03-02 DIAGNOSIS — K222 Esophageal obstruction: Secondary | ICD-10-CM | POA: Diagnosis present

## 2020-03-02 DIAGNOSIS — A419 Sepsis, unspecified organism: Secondary | ICD-10-CM | POA: Diagnosis present

## 2020-03-02 DIAGNOSIS — R58 Hemorrhage, not elsewhere classified: Secondary | ICD-10-CM | POA: Diagnosis not present

## 2020-03-02 DIAGNOSIS — E782 Mixed hyperlipidemia: Secondary | ICD-10-CM | POA: Diagnosis not present

## 2020-03-02 DIAGNOSIS — I5042 Chronic combined systolic (congestive) and diastolic (congestive) heart failure: Secondary | ICD-10-CM | POA: Diagnosis not present

## 2020-03-02 DIAGNOSIS — Z66 Do not resuscitate: Secondary | ICD-10-CM | POA: Diagnosis present

## 2020-03-02 DIAGNOSIS — E785 Hyperlipidemia, unspecified: Secondary | ICD-10-CM | POA: Diagnosis present

## 2020-03-02 DIAGNOSIS — I350 Nonrheumatic aortic (valve) stenosis: Secondary | ICD-10-CM | POA: Diagnosis not present

## 2020-03-02 DIAGNOSIS — R278 Other lack of coordination: Secondary | ICD-10-CM | POA: Diagnosis not present

## 2020-03-02 DIAGNOSIS — E872 Acidosis: Secondary | ICD-10-CM | POA: Diagnosis present

## 2020-03-02 DIAGNOSIS — I10 Essential (primary) hypertension: Secondary | ICD-10-CM | POA: Diagnosis not present

## 2020-03-02 DIAGNOSIS — E1165 Type 2 diabetes mellitus with hyperglycemia: Secondary | ICD-10-CM | POA: Diagnosis present

## 2020-03-02 DIAGNOSIS — E870 Hyperosmolality and hypernatremia: Secondary | ICD-10-CM | POA: Diagnosis present

## 2020-03-02 DIAGNOSIS — R195 Other fecal abnormalities: Secondary | ICD-10-CM | POA: Diagnosis not present

## 2020-03-02 DIAGNOSIS — E1169 Type 2 diabetes mellitus with other specified complication: Secondary | ICD-10-CM | POA: Diagnosis not present

## 2020-03-02 DIAGNOSIS — G9341 Metabolic encephalopathy: Secondary | ICD-10-CM | POA: Diagnosis present

## 2020-03-02 DIAGNOSIS — M2550 Pain in unspecified joint: Secondary | ICD-10-CM | POA: Diagnosis not present

## 2020-03-02 DIAGNOSIS — F411 Generalized anxiety disorder: Secondary | ICD-10-CM | POA: Diagnosis not present

## 2020-03-02 DIAGNOSIS — K219 Gastro-esophageal reflux disease without esophagitis: Secondary | ICD-10-CM | POA: Diagnosis present

## 2020-03-02 DIAGNOSIS — R4020 Unspecified coma: Secondary | ICD-10-CM | POA: Diagnosis not present

## 2020-03-02 DIAGNOSIS — R944 Abnormal results of kidney function studies: Secondary | ICD-10-CM | POA: Diagnosis not present

## 2020-03-02 DIAGNOSIS — N179 Acute kidney failure, unspecified: Secondary | ICD-10-CM | POA: Diagnosis present

## 2020-03-02 DIAGNOSIS — E669 Obesity, unspecified: Secondary | ICD-10-CM | POA: Diagnosis present

## 2020-03-02 DIAGNOSIS — G309 Alzheimer's disease, unspecified: Secondary | ICD-10-CM | POA: Diagnosis not present

## 2020-03-02 DIAGNOSIS — R4182 Altered mental status, unspecified: Secondary | ICD-10-CM | POA: Diagnosis not present

## 2020-03-02 DIAGNOSIS — Z20828 Contact with and (suspected) exposure to other viral communicable diseases: Secondary | ICD-10-CM | POA: Diagnosis not present

## 2020-03-02 DIAGNOSIS — L89153 Pressure ulcer of sacral region, stage 3: Secondary | ICD-10-CM | POA: Diagnosis present

## 2020-03-02 DIAGNOSIS — I110 Hypertensive heart disease with heart failure: Secondary | ICD-10-CM | POA: Diagnosis present

## 2020-03-02 DIAGNOSIS — K3189 Other diseases of stomach and duodenum: Secondary | ICD-10-CM | POA: Diagnosis not present

## 2020-03-02 DIAGNOSIS — Z20822 Contact with and (suspected) exposure to covid-19: Secondary | ICD-10-CM | POA: Diagnosis present

## 2020-03-02 DIAGNOSIS — R41841 Cognitive communication deficit: Secondary | ICD-10-CM | POA: Diagnosis not present

## 2020-03-02 DIAGNOSIS — Z7401 Bed confinement status: Secondary | ICD-10-CM | POA: Diagnosis not present

## 2020-03-02 DIAGNOSIS — R600 Localized edema: Secondary | ICD-10-CM | POA: Diagnosis not present

## 2020-03-02 LAB — GLUCOSE, CAPILLARY
Glucose-Capillary: 107 mg/dL — ABNORMAL HIGH (ref 70–99)
Glucose-Capillary: 146 mg/dL — ABNORMAL HIGH (ref 70–99)

## 2020-03-02 MED ORDER — LIP MEDEX EX OINT: TOPICAL_OINTMENT | CUTANEOUS | 0 refills | Status: DC | PRN

## 2020-03-02 MED ORDER — ALPRAZOLAM 0.25 MG PO TABS: 0.2500 mg | ORAL_TABLET | ORAL | 0 refills | Status: DC

## 2020-03-02 MED ORDER — INSULIN DETEMIR 100 UNIT/ML ~~LOC~~ SOLN: 12.0000 [IU] | Freq: Every day | SUBCUTANEOUS | 0 refills | Status: DC

## 2020-03-02 MED ORDER — ALPRAZOLAM 0.5 MG PO TABS: 0.1250 mg | ORAL_TABLET | ORAL | 0 refills | Status: DC

## 2020-03-02 NOTE — Discharge Summary (Signed)
Physician Discharge Summary  Katherine [name] NUU:[id number] DOB: [birth year] DOA: 02/22/2020  PCP: Celene Squibb, MD  Admit date: 02/22/2020 Discharge date: 03/02/2020  Recommendations for Outpatient Follow-up:  1. Discharge to SNF with PT/OT 2. Follow up with PCP in 7-10 days. 3. The patient should have CBC at that PCP visit. 4. Palliative care to follow as outpatient.  Discharge Diagnoses: Principal diagnosis is #1 1. GI Bleed 2. Acute Blood loss Anemia 3. Gastric Tumor 4. UTI 5. AKI 6. Atrial fibrillation 7. Hypernatremia 8. CAD, S/P CABG/TAVR 9. Advanced dementia  Discharge Condition: Fair  Disposition: SNF with palliative care as outpatient.  Diet recommendation: Heart healthy  Filed Weights   02/28/20 2033 02/29/20 2128 03/01/20 2104  Weight: 83.4 kg 79.4 kg 79.4 kg    History of present illness: Katherine [name] is a 86 y.o. female with PMH CAD s/p CABG x 4, morbid obesity, HTN, HLD, DM II,and h/o severe AS s/p TAVRwho [facility] after presenting to outside hospital for weakness and found to have Hgb 6.2 presumed secondary to GI bleed.   Patient with advanced dementia and therefore all history obtained from daughter [name].  Daughter reports that this morning this patient seemed less alert and more confused than normal and also more weak. She can walk with a walker at baseline and went into the bathroom this morning and was then unable to get up. [name] reports frequent UTI's and this is what she assumed was the problem again, she took her to [facility] in [place], [place] where she was found to have Hgb 6.2 and positive hemoccult POC. Patient was given 2 units of blood and transferred to [facility]. [name] reports that she had noted darker stools over past 2 days but reports that this sometimes happens and she did not think anything of it. She has not noted any other concerns. At baseline she can have limited conversations. She lives with [name] and they have aides  come during the day to help care for her. She now notes that the patient does appear pale. She has been eating normally. Denies fever, cough, SOB, vomiting, diarrhea, constipation, hematuria, difficulty moving arms/legs, speech difficulty.  At outside ER: Labs remarkable for Hgb 6.2 otherwise CBC WNL. POC Hemoccult positive. Drug screen, Tylenol level, Aspirin level, ammonia level unremarkable. Cr 1.44 (unknown baseline), Na 146, K 4.1, Cl 115, Glucose 149, LFT's WNL. CPK 658, Lactate 1.2, PT 13.3, INR 1.2, Trop 0.027. UA with large leuks, neg nitrite and many WBC's. COVID negative. CT Head and CXR without acute findings. Patient given 1L NS, Protonix 80 mg and Rocephin 1 g.  EKG: HR 80. Sinus rhythm. QTc 395. No STEMI. LVH.  Vitals on arrival: Mild range BP with stable heart rate, temp and RR. 100% O2 sat.   Hospital Course:  86 y.o.femalewith PMHCAD s/p CABG x 4, morbid obesity, HTN, HLD, DM II,and h/o severe AS s/p TAVRwho transferred to [facility] after presenting to [facility] with complaints of weakness/AMS and found to have Hgb 6.2 presumed secondary to GI bleed. Per family, patient seemed less alert and weak. She can walk with a walker at baseline and went into the bathroom but unable to get up. reports frequent UTI's and this is what she assumed was the problem again, she took her to [facility] in [place], [place] where she was found to have Hgb 6.2 and positive hemoccult POC. Patient was given 2 units of blood and transferred to [facility]. Daughter reported melena over past 2  days but reports that this sometimes happens. At baseline she can have limited conversations. She lives with [name] and they have aides come during the day to help care for her.  Hospital course: Patient admitted to [facility] for further evaluation and management with GI consultation. Underwent EGD on 3/22 revealing large (4 to 5 cm), submucosal mass with with surface ulceration, likely the source of bleeding. General surgery evaluated patient  and did not feel candidate for aggressive interventions after discussing with family. Per Gastroenterology the patient should continue on protonix 40 mg bid indefinitely. GI also states that should the patient rebleed again, the only solution would be operative.Cardiology evaluated and stated okay to hold off on Plavix indefinitely. She has been restarted on her home antihypertensives.  Today's assessment: S: The patient is resting quietly. No new complaints. O: Vitals:  Vitals:   03/02/20 0518 03/02/20 0932  BP: (!) 138/46 (!) 131/50  Pulse: (!) 54 (!) 56  Resp: 16 16  Temp: 98.2 F (36.8 C) 97.8 F (36.6 C)  SpO2: 100% 99%   Exam:  Constitutional:   The patient is awake, alert, and oriented x 3. No acute distress. Respiratory:   No increased work of breathing.  No wheezes, rales, or rhonchi  No tactile fremitus Cardiovascular:   Regular rate and rhythm  No murmurs, ectopy, or gallups.  No lateral PMI. No thrills. Abdomen:   Abdomen is soft, non-tender, non-distended  No hernias, masses, or organomegaly  Normoactive bowel sounds.  Musculoskeletal:   No cyanosis, clubbing, or edema Skin:   No rashes, lesions, ulcers  palpation of skin: no induration or nodules Neurologic:   CN 2-12 intact  Sensation all 4 extremities intact Psychiatric:   Mental status ? Mood, affect appropriate ? Orientation to person, place, time   judgment and insight appear intact  Discharge Instructions  Discharge Instructions    Activity as tolerated - No restrictions   Complete by: As directed    Call MD for:  difficulty breathing, headache or visual disturbances   Complete by: As directed    Call MD for:  extreme fatigue   Complete by: As directed    Call MD for:  persistant dizziness or light-headedness   Complete by: As directed    Diet - low sodium heart healthy   Complete by: As directed    Diet Carb Modified   Complete by: As directed    Discharge  instructions   Complete by: As directed    Discharge to SNF with PT/OT Follow up with PCP in 7-10 days. The patient should have CBC at that PCP visit. Palliative care to follow as outpatient.   Increase activity slowly   Complete by: As directed      Allergies as of 03/02/2020      Reactions   Tape Other (See Comments)   SKIN IS VERY THIN AND WILL TEAR EASILY!!      Medication List    STOP taking these medications   metFORMIN 500 MG 24 hr tablet Commonly known as: GLUCOPHAGE-XR     TAKE these medications   acetaminophen 500 MG tablet Commonly known as: TYLENOL Take 1,000 mg by mouth 2 (two) times daily. for pain. Do not exceed 4gm of Tylenol in 24 hours   albuterol 108 (90 Base) MCG/ACT inhaler Commonly known as: VENTOLIN HFA Inhale 1 puff into the lungs every 6 (six) hours as needed for wheezing or shortness of breath.   ALPRAZolam 0.25 MG tablet Commonly known as: Duanne Moron  Take 1 tablet (0.25 mg total) by mouth See admin instructions. Take 1/4 tablet (0.125 mg) by mouth every morning and 1 tablet (0.5 mg) at night, may also take 1/4 tablet (0.125 mg) with lunch as needed for anxiety. What changed:   medication strength  how much to take   atorvastatin 80 MG tablet Commonly known as: LIPITOR Take 80 mg by mouth daily with lunch.   budesonide-formoterol 80-4.5 MCG/ACT inhaler Commonly known as: SYMBICORT Inhale 2 puffs into the lungs 2 (two) times daily as needed (shortness of breath/wheezing).   cetirizine 10 MG tablet Commonly known as: ZYRTEC Take 10 mg by mouth daily as needed for allergies or rhinitis.   CLEAR EYES OP Place 1 drop into both eyes daily as needed (dry eyes).   clopidogrel 75 MG tablet Commonly known as: PLAVIX Take 1 tablet (75 mg total) by mouth daily with breakfast.   donepezil 10 MG tablet Commonly known as: ARICEPT Take 10 mg by mouth at bedtime.   esomeprazole 20 MG capsule Commonly known as: NEXIUM Take 20 mg by mouth daily.     furosemide 20 MG tablet Commonly known as: LASIX TAKE 2 TABLETS BY MOUTH EVERY DAY What changed:   how much to take  when to take this   hydrALAZINE 50 MG tablet Commonly known as: APRESOLINE Take 1 tablet (50 mg total) by mouth every 8 (eight) hours. What changed: additional instructions   insulin detemir 100 UNIT/ML injection Commonly known as: LEVEMIR Inject 0.12 mLs (12 Units total) into the skin at bedtime.   ipratropium 0.03 % nasal spray Commonly known as: ATROVENT Place 1 spray into both nostrils daily as needed for rhinitis (sneezing).   Linzess 145 MCG Caps capsule Generic drug: linaclotide Take 145 mcg by mouth daily as needed (for constipation).   lip balm ointment Apply topically as needed for lip care.   lisinopril 10 MG tablet Commonly known as: ZESTRIL Take 1 tablet (10 mg total) by mouth daily.   loperamide 2 MG capsule Commonly known as: IMODIUM Take 2 mg by mouth 2 (two) times daily as needed for diarrhea or loose stools.   multivitamin with minerals Tabs tablet Take 1 tablet by mouth daily.   Namenda XR 28 MG Cp24 24 hr capsule Generic drug: memantine Take 28 mg by mouth daily. for dementia   nebivolol 10 MG tablet Commonly known as: BYSTOLIC Take 10 mg by mouth daily. for blood pressure   nystatin powder Generic drug: nystatin Apply 1 application topically 2 (two) times daily as needed for for breast care.   potassium chloride SA 20 MEQ tablet Commonly known as: KLOR-CON Take 20 mEq by mouth 2 (two) times daily with breakfast and lunch.   THERAWORX RELIEF EX Apply 1 application topically 2 (two) times daily as needed (pain).      Allergies  Allergen Reactions  . Tape Other (See Comments)    SKIN IS VERY THIN AND WILL TEAR EASILY!!    The results of significant diagnostics from this hospitalization (including imaging, microbiology, ancillary and laboratory) are listed below for reference.    Significant Diagnostic Studies: No  results found.  Microbiology: Recent Results (from the past 240 hour(s))  Culture, Urine     Status: Abnormal   Collection Time: 02/23/20 12:12 AM   Specimen: Urine, Catheterized  Result Value Ref Range Status   Specimen Description URINE, CATHETERIZED  Final   Special Requests   Final    NONE Performed at [facility],  [address] [place], [place] [address]    Culture MULTIPLE SPECIES PRESENT, SUGGEST RECOLLECTION (A)  Final   Report Status 02/24/2020 FINAL  Final  SARS CORONAVIRUS 2 (TAT 6-24 HRS) Nasopharyngeal Nasopharyngeal Swab     Status: None   Collection Time: 02/26/20  5:18 PM   Specimen: Nasopharyngeal Swab  Result Value Ref Range Status   SARS Coronavirus 2 NEGATIVE NEGATIVE Final    Comment: (NOTE) SARS-CoV-2 target nucleic acids are NOT DETECTED. The SARS-CoV-2 RNA is generally detectable in upper and lower respiratory specimens during the acute phase of infection. Negative results do not preclude SARS-CoV-2 infection, do not rule out co-infections with other pathogens, and should not be used as the sole basis for treatment or other patient management decisions. Negative results must be combined with clinical observations, patient history, and epidemiological information. The expected result is Negative. Fact Sheet for Patients: SugarRoll.be Fact Sheet for Healthcare Providers: https://www.woods-mathews.com/ This test is not yet approved or cleared by the [place] FDA and  has been authorized for detection and/or diagnosis of SARS-CoV-2 by FDA under an Emergency Use Authorization (EUA). This EUA will remain  in effect (meaning this test can be used) for the duration of the COVID-19 declaration under Section 56 4(b)(1) of the Act, 21 U.S.C. section 360bbb-3(b)(1), unless the authorization is terminated or revoked sooner. Performed at [facility], [place] [address], [place], [place] [address]   SARS  CORONAVIRUS 2 (TAT 6-24 HRS) Nasopharyngeal Nasopharyngeal Swab     Status: None   Collection Time: 03/01/20 12:05 PM   Specimen: Nasopharyngeal Swab  Result Value Ref Range Status   SARS Coronavirus 2 NEGATIVE NEGATIVE Final    Comment: (NOTE) SARS-CoV-2 target nucleic acids are NOT DETECTED. The SARS-CoV-2 RNA is generally detectable in upper and lower respiratory specimens during the acute phase of infection. Negative results do not preclude SARS-CoV-2 infection, do not rule out co-infections with other pathogens, and should not be used as the sole basis for treatment or other patient management decisions. Negative results must be combined with clinical observations, patient history, and epidemiological information. The expected result is Negative. Fact Sheet for Patients: SugarRoll.be Fact Sheet for Healthcare Providers: https://www.woods-mathews.com/ This test is not yet approved or cleared by the [place] FDA and  has been authorized for detection and/or diagnosis of SARS-CoV-2 by FDA under an Emergency Use Authorization (EUA). This EUA will remain  in effect (meaning this test can be used) for the duration of the COVID-19 declaration under Section 56 4(b)(1) of the Act, 21 U.S.C. section 360bbb-3(b)(1), unless the authorization is terminated or revoked sooner. Performed at [facility], [place] [address], [place], [place] [address]      Labs: Basic Metabolic Panel: Recent Labs  Lab 02/25/20 0247 02/26/20 0555 03/01/20 0452  NA 147* 145 141  K 3.6 3.6 3.5  CL 121* 114* 106  CO2 21* 24 25  GLUCOSE 136* 148* 157*  BUN 21 11 9   CREATININE 1.11* 1.02* 1.06*  CALCIUM 9.6 9.6 9.6   Liver Function Tests: Recent Labs  Lab 02/25/20 0247  AST 18  ALT 16  ALKPHOS 45  BILITOT 0.5  PROT 4.9*  ALBUMIN 2.5*   No results for input(s): LIPASE, AMYLASE in the last 168 hours. No results for input(s): AMMONIA in the last 168  hours. CBC: Recent Labs  Lab 02/25/20 0247 02/26/20 0555 02/27/20 0354 03/01/20 0452  WBC 9.2 9.1 8.2 8.3  NEUTROABS  --   --   --  6.1  HGB 7.8* 8.4* 8.5* 8.7*  HCT 23.7* 25.0* 25.3* 25.6*  MCV 90.8 89.6 88.8 86.8  PLT 181 215 213 294   Cardiac Enzymes: No results for input(s): CKTOTAL, CKMB, CKMBINDEX, TROPONINI in the last 168 hours. BNP: BNP (last 3 results) No results for input(s): BNP in the last 8760 hours.  ProBNP (last 3 results) No results for input(s): PROBNP in the last 8760 hours.  CBG: Recent Labs  Lab 03/01/20 1138 03/01/20 1610 03/01/20 2105 03/02/20 0647 03/02/20 1107  GLUCAP 128* 140* 150* 107* 146*    Principal Problem:   GI bleed Active Problems:   Diabetes mellitus type 2, uncontrolled (HCC)   Hyperlipidemia   Anxiety state   Essential hypertension   Coronary atherosclerosis   S/P CABG x 4   Morbid obesity (HCC)   S/p TAVR (transcatheter aortic valve replacement), bioprosthetic   Chronic diastolic heart failure (HCC)   Urinary tract infection   Chronic diastolic CHF (congestive heart failure) (HCC)   Advanced dementia (HCC)   Heme positive stool   Esophageal ring   Palliative care by specialist   Goals of care, counseling/discussion   Time coordinating discharge: 38 minutes.  Signed:        Cortlynn Hollinsworth, DO [facility]  03/02/2020, 12:39 PM

## 2020-03-02 NOTE — Consult Note (Signed)
   [facility] CM Inpatient Consult   03/02/2020  JAILEE JAQUEZ October 29, 1928 [id number]   Patient screened for length of stay 8 days hospitalization to check if potential [place] Management services are needed.  Review of patient's medical record reveals patient is currently for transitioning to a skilled nursing facility [snf] as recommended for palliative care and transitioning needs, in the Medicare [employer] Accountable Care Organization [ACO].  Primary Care Provider is  Dr. Allyn Kenner   Plan:  Follow transition for progress and disposition to assess for post hospital care management needs.  If patient goes to a [facility] affiliated facility with alert [facility] RN PAC of transition.  Please place a [facility] Care Management consult as appropriate and for questions contact:   Natividad Brood, RN BSN [facility] Liaison  [phone] business mobile phone Toll free office [phone]  Fax number: [phone] [email or web address]

## 2020-03-02 NOTE — Plan of Care (Signed)

## 2020-03-02 NOTE — TOC Transition Note (Addendum)
Transition of Care (TOC) - CM/SW Discharge Note 03/02/20 - Discharged to [facility] via non-emergency ambulance *Number for report: [phone].  Room 3225   Patient Details  Name: Katherine [name] MRN: [id number] Date of Birth: 13-Oct-1928  Transition of Care [facility]) CM/SW Contact:  Sable Feil, LCSW Phone Number: 03/02/2020, 1:29 PM   Clinical Narrative: Patient medically stable for discharge and [facility] chosen by family. [facility] admissions director [place] contacted and can accept patient today. MD contacted and d/c summary completed and sent to nursing facility, along with transfer summary. Ms. Charlie will be transported by ambulance.       Final next level of care: Skilled Nursing Facility Barriers to Discharge: Barriers Resolved   Patient Goals and CMS Choice Patient states their goals for this hospitalization and ongoing recovery are:: Family in agreement that SNF needed before patient returns home CMS Medicare.gov Compare Post Acute Care list provided to:: Patient Represenative (must comment) Choice offered to / list presented to : Adult Children  Discharge Placement   Existing PASRR number confirmed : 02/26/20          Patient chooses bed at: [facility] Patient to be transferred to facility by: Ambulance Name of family member notified: Daughter Martie Round [phone] Patient and family notified of of transfer: 03/02/20  Discharge Plan and Services In-house Referral: Hospice / Palliative Care Discharge Planning Services: CM Consult Post Acute Care Choice: [place]          DME Arranged: N/A DME Agency: NA       HH Arranged: NA HH Agency: NA        Social Determinants of Health (SDOH) Interventions  No SDOH interventions requested or needed prior to discharge   Readmission Risk Interventions No flowsheet data found.

## 2020-03-04 ENCOUNTER — Other Ambulatory Visit: Payer: Self-pay | Admitting: *Deleted

## 2020-03-04 DIAGNOSIS — R600 Localized edema: Secondary | ICD-10-CM | POA: Diagnosis not present

## 2020-03-04 DIAGNOSIS — I350 Nonrheumatic aortic (valve) stenosis: Secondary | ICD-10-CM | POA: Diagnosis not present

## 2020-03-04 DIAGNOSIS — R944 Abnormal results of kidney function studies: Secondary | ICD-10-CM | POA: Diagnosis not present

## 2020-03-04 DIAGNOSIS — F5105 Insomnia due to other mental disorder: Secondary | ICD-10-CM | POA: Diagnosis not present

## 2020-03-04 DIAGNOSIS — E782 Mixed hyperlipidemia: Secondary | ICD-10-CM | POA: Diagnosis not present

## 2020-03-04 DIAGNOSIS — E1169 Type 2 diabetes mellitus with other specified complication: Secondary | ICD-10-CM | POA: Diagnosis not present

## 2020-03-04 DIAGNOSIS — I110 Hypertensive heart disease with heart failure: Secondary | ICD-10-CM | POA: Diagnosis not present

## 2020-03-04 DIAGNOSIS — I5042 Chronic combined systolic (congestive) and diastolic (congestive) heart failure: Secondary | ICD-10-CM | POA: Diagnosis not present

## 2020-03-04 DIAGNOSIS — F0151 Vascular dementia with behavioral disturbance: Secondary | ICD-10-CM | POA: Diagnosis not present

## 2020-03-04 DIAGNOSIS — E870 Hyperosmolality and hypernatremia: Secondary | ICD-10-CM | POA: Diagnosis not present

## 2020-03-04 DIAGNOSIS — R197 Diarrhea, unspecified: Secondary | ICD-10-CM | POA: Diagnosis not present

## 2020-03-04 NOTE — Patient Outreach (Signed)
Screened for potential [facility] Care Management needs as a benefit of  [employer] ACO Medicare.  Mrs. Kocher is currently receiving skilled therapy at [facility] SNF.   Writer attended telephonic interdisciplinary team meeting to assess for disposition needs and transition plan for resident.   Facility reports member recently admitted to facility.   Writer to follow for potential [facility] Care Management needs while member resides in SNF.   Marthenia Rolling, MSN-Ed, RN,BSN [place] Acute Care Coordinator [phone] [facility]) [phone]  (Toll free office)

## 2020-03-05 DIAGNOSIS — K922 Gastrointestinal hemorrhage, unspecified: Secondary | ICD-10-CM | POA: Diagnosis not present

## 2020-03-05 DIAGNOSIS — G309 Alzheimer's disease, unspecified: Secondary | ICD-10-CM | POA: Diagnosis not present

## 2020-03-05 DIAGNOSIS — D490 Neoplasm of unspecified behavior of digestive system: Secondary | ICD-10-CM | POA: Diagnosis not present

## 2020-03-05 DIAGNOSIS — I2510 Atherosclerotic heart disease of native coronary artery without angina pectoris: Secondary | ICD-10-CM | POA: Diagnosis not present

## 2020-03-13 DIAGNOSIS — R944 Abnormal results of kidney function studies: Secondary | ICD-10-CM | POA: Diagnosis not present

## 2020-03-13 DIAGNOSIS — F0151 Vascular dementia with behavioral disturbance: Secondary | ICD-10-CM | POA: Diagnosis not present

## 2020-03-13 DIAGNOSIS — E1169 Type 2 diabetes mellitus with other specified complication: Secondary | ICD-10-CM | POA: Diagnosis not present

## 2020-03-13 DIAGNOSIS — E782 Mixed hyperlipidemia: Secondary | ICD-10-CM | POA: Diagnosis not present

## 2020-03-13 DIAGNOSIS — R600 Localized edema: Secondary | ICD-10-CM | POA: Diagnosis not present

## 2020-03-13 DIAGNOSIS — I5042 Chronic combined systolic (congestive) and diastolic (congestive) heart failure: Secondary | ICD-10-CM | POA: Diagnosis not present

## 2020-03-13 DIAGNOSIS — R197 Diarrhea, unspecified: Secondary | ICD-10-CM | POA: Diagnosis not present

## 2020-03-13 DIAGNOSIS — I350 Nonrheumatic aortic (valve) stenosis: Secondary | ICD-10-CM | POA: Diagnosis not present

## 2020-03-13 DIAGNOSIS — F5105 Insomnia due to other mental disorder: Secondary | ICD-10-CM | POA: Diagnosis not present

## 2020-03-13 DIAGNOSIS — E870 Hyperosmolality and hypernatremia: Secondary | ICD-10-CM | POA: Diagnosis not present

## 2020-03-13 DIAGNOSIS — I130 Hypertensive heart and chronic kidney disease with heart failure and stage 1 through stage 4 chronic kidney disease, or unspecified chronic kidney disease: Secondary | ICD-10-CM | POA: Diagnosis not present

## 2020-03-18 ENCOUNTER — Other Ambulatory Visit: Payer: Self-pay

## 2020-03-18 ENCOUNTER — Non-Acute Institutional Stay: Payer: Medicare Other | Admitting: *Deleted

## 2020-03-18 DIAGNOSIS — Z515 Encounter for palliative care: Secondary | ICD-10-CM

## 2020-03-18 NOTE — Progress Notes (Signed)
COMMUNITY PALLIATIVE CARE RN NOTE  PATIENT NAME: Katherine [name] DOB: 09/02/1928 MRN: [id number]  PRIMARY CARE PROVIDER: Celene Squibb, MD  RESPONSIBLE PARTY: Orpah Greek (daughter) Acct ID - Guarantor Home Phone Work Phone Relationship Acct Type  [id number] GEORGI, [id number] [phone]  Self P/F     [address] [place], [place] [address]   Covid-19 Pre-screening Negative  PLAN OF CARE and INTERVENTION:  1. ADVANCE CARE PLANNING/GOALS OF CARE: Patient is currently at [facility] skilled facility for rehab. She has a DNR. 2. PATIENT/CAREGIVER EDUCATION: Symptom management, s/s of decline, s/s of infection 3. DISEASE STATUS: Patient was recently hospitalized from 02/22/20 to 03/02/20 for chief complaints of weakness and altered mental status. She came to [facility] from a hospital in [place], [place]. In [place], she was found to have a Hgb of 6.2, positive hemoccult and was given 2 units of PRBCs prior to being transferred to [facility]. She had a EGD on 3/22 which showed a large 4-5cm submucosal mass with surface ulceration which is the likely source of bleeding per report. She is to continue Protonix 40 mg BID and her Plavix was placed on hold indefinitely. She was previously living with her daughter with hired caregivers coming in to help. She was then transferred to this facility for rehab. Met with patient at the facility. Upon arrival, patient is sitting up in her wheelchair in the common area asleep. She is slow to arouse to verbal and tactile stimulation and slow to make eye contact. She is sitting with her head down throughout the visit. She is minimally engaging and only answered questions with simple/short replies. She reports mild pain in her lower back. I made facility med aide aware. Respirations are regular and unlabored. She is total care for transfers, bathing, dressing and toileting. She is transferred via [place]. Prior to her hospitalization, she was ambulatory using her walker. She  is able to feed herself independently. She is incontinent and wears adult briefs. Staff reports that she ate good for breakfast this morning. She is given her medication crushed in applesauce and she swallows these without difficulty. She is a diabetic and takes Levemir at bedtime. She does have some trace edema in her bilateral lower extremities and left hand. She would not allow me to obtain vitals and kept falling asleep throughout visit. She is currently receiving PT/OT. Will continue to monitor.  HISTORY OF PRESENT ILLNESS:  This is a 86 yo female who resides at [facility] skilled facility for rehab. She has a h/o CAD s/p CABG x 4, morbid obesity, HTN, HLD, DM II, and severe aortic stenosis s/p TAVR.  Palliative care team asked to follow patient for additional support. Will visit patient monthly and PRN.  CODE STATUS: DNR ADVANCED DIRECTIVES: Y MOST FORM: yes (DNR, Limited Additional interventions (Do not intubate), Antibiotics if indicated, IV fluids if indicated, no feeding tube)  PPS: 30%   PHYSICAL EXAM:   LUNGS: clear to auscultation  CARDIAC: Cor RRR EXTREMITIES: Trace edema to bilateral lower extremities and left hand SKIN: Exposed skin is dry and intact  NEURO: Alert and oriented to self, intermittent confusion, minimally engaging, non-ambulatory   (Duration of visit and documentation 60 minutes)   Daryl Eastern, RN BSN

## 2020-03-24 ENCOUNTER — Other Ambulatory Visit: Payer: Self-pay | Admitting: *Deleted

## 2020-03-24 DIAGNOSIS — F0391 Unspecified dementia with behavioral disturbance: Secondary | ICD-10-CM | POA: Diagnosis not present

## 2020-03-24 DIAGNOSIS — F411 Generalized anxiety disorder: Secondary | ICD-10-CM | POA: Diagnosis not present

## 2020-03-24 DIAGNOSIS — F339 Major depressive disorder, recurrent, unspecified: Secondary | ICD-10-CM | POA: Diagnosis not present

## 2020-03-24 NOTE — Patient Outreach (Signed)
Member screened for potential [facility] Care Management needs as a benefit of [place] Medicare.  [name] is receiving therapy at [facility] SNF. Verified with facility SW that transition plan is for long term care at the facility. Noted [facility] palliative care is also following member at [facility].    Katherine Rolling, MSN-Ed, RN,BSN [place] Acute Care Coordinator [phone] [facility]) [phone]  (Toll free office)

## 2020-03-26 ENCOUNTER — Emergency Department (HOSPITAL_COMMUNITY): Payer: Medicare Other

## 2020-03-26 ENCOUNTER — Inpatient Hospital Stay (HOSPITAL_COMMUNITY)
Admission: EM | Admit: 2020-03-26 | Discharge: 2020-03-30 | DRG: 871 | Disposition: A | Discharge status: Hospice | Payer: Medicare Other | Source: Skilled Nursing Facility | Attending: Internal Medicine | Admitting: Internal Medicine

## 2020-03-26 ENCOUNTER — Encounter (HOSPITAL_COMMUNITY): Payer: Self-pay | Admitting: Internal Medicine

## 2020-03-26 ENCOUNTER — Inpatient Hospital Stay (HOSPITAL_COMMUNITY): Payer: Medicare Other

## 2020-03-26 DIAGNOSIS — G9340 Encephalopathy, unspecified: Secondary | ICD-10-CM | POA: Diagnosis not present

## 2020-03-26 DIAGNOSIS — K219 Gastro-esophageal reflux disease without esophagitis: Secondary | ICD-10-CM | POA: Diagnosis present

## 2020-03-26 DIAGNOSIS — E872 Acidosis: Secondary | ICD-10-CM | POA: Diagnosis present

## 2020-03-26 DIAGNOSIS — I2510 Atherosclerotic heart disease of native coronary artery without angina pectoris: Secondary | ICD-10-CM | POA: Diagnosis present

## 2020-03-26 DIAGNOSIS — E860 Dehydration: Secondary | ICD-10-CM | POA: Diagnosis present

## 2020-03-26 DIAGNOSIS — E878 Other disorders of electrolyte and fluid balance, not elsewhere classified: Secondary | ICD-10-CM | POA: Diagnosis present

## 2020-03-26 DIAGNOSIS — R4182 Altered mental status, unspecified: Secondary | ICD-10-CM

## 2020-03-26 DIAGNOSIS — I5032 Chronic diastolic (congestive) heart failure: Secondary | ICD-10-CM | POA: Diagnosis present

## 2020-03-26 DIAGNOSIS — F419 Anxiety disorder, unspecified: Secondary | ICD-10-CM | POA: Diagnosis present

## 2020-03-26 DIAGNOSIS — Z7189 Other specified counseling: Secondary | ICD-10-CM | POA: Diagnosis not present

## 2020-03-26 DIAGNOSIS — K449 Diaphragmatic hernia without obstruction or gangrene: Secondary | ICD-10-CM | POA: Diagnosis present

## 2020-03-26 DIAGNOSIS — Z7951 Long term (current) use of inhaled steroids: Secondary | ICD-10-CM

## 2020-03-26 DIAGNOSIS — E669 Obesity, unspecified: Secondary | ICD-10-CM | POA: Diagnosis present

## 2020-03-26 DIAGNOSIS — I350 Nonrheumatic aortic (valve) stenosis: Secondary | ICD-10-CM | POA: Diagnosis not present

## 2020-03-26 DIAGNOSIS — R739 Hyperglycemia, unspecified: Secondary | ICD-10-CM

## 2020-03-26 DIAGNOSIS — I110 Hypertensive heart disease with heart failure: Secondary | ICD-10-CM | POA: Diagnosis present

## 2020-03-26 DIAGNOSIS — A419 Sepsis, unspecified organism: Secondary | ICD-10-CM | POA: Diagnosis present

## 2020-03-26 DIAGNOSIS — R339 Retention of urine, unspecified: Secondary | ICD-10-CM | POA: Diagnosis present

## 2020-03-26 DIAGNOSIS — K319 Disease of stomach and duodenum, unspecified: Secondary | ICD-10-CM | POA: Diagnosis present

## 2020-03-26 DIAGNOSIS — I959 Hypotension, unspecified: Secondary | ICD-10-CM | POA: Diagnosis not present

## 2020-03-26 DIAGNOSIS — E1159 Type 2 diabetes mellitus with other circulatory complications: Secondary | ICD-10-CM | POA: Diagnosis not present

## 2020-03-26 DIAGNOSIS — I130 Hypertensive heart and chronic kidney disease with heart failure and stage 1 through stage 4 chronic kidney disease, or unspecified chronic kidney disease: Secondary | ICD-10-CM | POA: Diagnosis not present

## 2020-03-26 DIAGNOSIS — Z6828 Body mass index (BMI) 28.0-28.9, adult: Secondary | ICD-10-CM | POA: Diagnosis not present

## 2020-03-26 DIAGNOSIS — Z66 Do not resuscitate: Secondary | ICD-10-CM

## 2020-03-26 DIAGNOSIS — Z22322 Carrier or suspected carrier of Methicillin resistant Staphylococcus aureus: Secondary | ICD-10-CM

## 2020-03-26 DIAGNOSIS — Z833 Family history of diabetes mellitus: Secondary | ICD-10-CM

## 2020-03-26 DIAGNOSIS — I25709 Atherosclerosis of coronary artery bypass graft(s), unspecified, with unspecified angina pectoris: Secondary | ICD-10-CM | POA: Diagnosis not present

## 2020-03-26 DIAGNOSIS — Z952 Presence of prosthetic heart valve: Secondary | ICD-10-CM

## 2020-03-26 DIAGNOSIS — L89152 Pressure ulcer of sacral region, stage 2: Secondary | ICD-10-CM | POA: Diagnosis not present

## 2020-03-26 DIAGNOSIS — R4020 Unspecified coma: Secondary | ICD-10-CM | POA: Diagnosis not present

## 2020-03-26 DIAGNOSIS — Z8249 Family history of ischemic heart disease and other diseases of the circulatory system: Secondary | ICD-10-CM

## 2020-03-26 DIAGNOSIS — F0280 Dementia in other diseases classified elsewhere without behavioral disturbance: Secondary | ICD-10-CM | POA: Diagnosis not present

## 2020-03-26 DIAGNOSIS — K5900 Constipation, unspecified: Secondary | ICD-10-CM | POA: Diagnosis present

## 2020-03-26 DIAGNOSIS — E870 Hyperosmolality and hypernatremia: Secondary | ICD-10-CM | POA: Diagnosis present

## 2020-03-26 DIAGNOSIS — G9341 Metabolic encephalopathy: Secondary | ICD-10-CM | POA: Diagnosis present

## 2020-03-26 DIAGNOSIS — Z515 Encounter for palliative care: Secondary | ICD-10-CM | POA: Diagnosis not present

## 2020-03-26 DIAGNOSIS — K222 Esophageal obstruction: Secondary | ICD-10-CM | POA: Diagnosis present

## 2020-03-26 DIAGNOSIS — F0390 Unspecified dementia without behavioral disturbance: Secondary | ICD-10-CM | POA: Diagnosis present

## 2020-03-26 DIAGNOSIS — L8990 Pressure ulcer of unspecified site, unspecified stage: Secondary | ICD-10-CM | POA: Insufficient documentation

## 2020-03-26 DIAGNOSIS — Z7401 Bed confinement status: Secondary | ICD-10-CM | POA: Diagnosis not present

## 2020-03-26 DIAGNOSIS — Z7902 Long term (current) use of antithrombotics/antiplatelets: Secondary | ICD-10-CM

## 2020-03-26 DIAGNOSIS — R6520 Severe sepsis without septic shock: Secondary | ICD-10-CM | POA: Diagnosis present

## 2020-03-26 DIAGNOSIS — J322 Chronic ethmoidal sinusitis: Secondary | ICD-10-CM | POA: Diagnosis present

## 2020-03-26 DIAGNOSIS — R404 Transient alteration of awareness: Secondary | ICD-10-CM | POA: Diagnosis not present

## 2020-03-26 DIAGNOSIS — J323 Chronic sphenoidal sinusitis: Secondary | ICD-10-CM | POA: Diagnosis present

## 2020-03-26 DIAGNOSIS — Z20822 Contact with and (suspected) exposure to covid-19: Secondary | ICD-10-CM | POA: Diagnosis present

## 2020-03-26 DIAGNOSIS — R32 Unspecified urinary incontinence: Secondary | ICD-10-CM | POA: Diagnosis not present

## 2020-03-26 DIAGNOSIS — R0602 Shortness of breath: Secondary | ICD-10-CM | POA: Diagnosis not present

## 2020-03-26 DIAGNOSIS — B961 Klebsiella pneumoniae [K. pneumoniae] as the cause of diseases classified elsewhere: Secondary | ICD-10-CM | POA: Diagnosis present

## 2020-03-26 DIAGNOSIS — R159 Full incontinence of feces: Secondary | ICD-10-CM | POA: Diagnosis not present

## 2020-03-26 DIAGNOSIS — E785 Hyperlipidemia, unspecified: Secondary | ICD-10-CM | POA: Diagnosis present

## 2020-03-26 DIAGNOSIS — I5030 Unspecified diastolic (congestive) heart failure: Secondary | ICD-10-CM | POA: Diagnosis not present

## 2020-03-26 DIAGNOSIS — G309 Alzheimer's disease, unspecified: Secondary | ICD-10-CM | POA: Diagnosis not present

## 2020-03-26 DIAGNOSIS — N179 Acute kidney failure, unspecified: Secondary | ICD-10-CM | POA: Diagnosis present

## 2020-03-26 DIAGNOSIS — E079 Disorder of thyroid, unspecified: Secondary | ICD-10-CM | POA: Diagnosis present

## 2020-03-26 DIAGNOSIS — E861 Hypovolemia: Secondary | ICD-10-CM | POA: Diagnosis present

## 2020-03-26 DIAGNOSIS — Z794 Long term (current) use of insulin: Secondary | ICD-10-CM

## 2020-03-26 DIAGNOSIS — K3189 Other diseases of stomach and duodenum: Secondary | ICD-10-CM | POA: Diagnosis not present

## 2020-03-26 DIAGNOSIS — R5381 Other malaise: Secondary | ICD-10-CM | POA: Diagnosis present

## 2020-03-26 DIAGNOSIS — R0902 Hypoxemia: Secondary | ICD-10-CM | POA: Diagnosis not present

## 2020-03-26 DIAGNOSIS — E1165 Type 2 diabetes mellitus with hyperglycemia: Secondary | ICD-10-CM | POA: Diagnosis present

## 2020-03-26 DIAGNOSIS — Z951 Presence of aortocoronary bypass graft: Secondary | ICD-10-CM

## 2020-03-26 DIAGNOSIS — Z6833 Body mass index (BMI) 33.0-33.9, adult: Secondary | ICD-10-CM

## 2020-03-26 DIAGNOSIS — L89153 Pressure ulcer of sacral region, stage 3: Secondary | ICD-10-CM | POA: Diagnosis present

## 2020-03-26 DIAGNOSIS — N184 Chronic kidney disease, stage 4 (severe): Secondary | ICD-10-CM | POA: Diagnosis not present

## 2020-03-26 DIAGNOSIS — Z91048 Other nonmedicinal substance allergy status: Secondary | ICD-10-CM

## 2020-03-26 DIAGNOSIS — K259 Gastric ulcer, unspecified as acute or chronic, without hemorrhage or perforation: Secondary | ICD-10-CM | POA: Diagnosis not present

## 2020-03-26 DIAGNOSIS — Z79899 Other long term (current) drug therapy: Secondary | ICD-10-CM

## 2020-03-26 DIAGNOSIS — N281 Cyst of kidney, acquired: Secondary | ICD-10-CM | POA: Diagnosis not present

## 2020-03-26 DIAGNOSIS — K922 Gastrointestinal hemorrhage, unspecified: Secondary | ICD-10-CM | POA: Diagnosis not present

## 2020-03-26 LAB — COMPREHENSIVE METABOLIC PANEL
ALT: 32 U/L (ref 0–44)
AST: 37 U/L (ref 15–41)
Albumin: 3.8 g/dL (ref 3.5–5.0)
Alkaline Phosphatase: 109 U/L (ref 38–126)
Anion gap: 16 — ABNORMAL HIGH (ref 5–15)
BUN: 70 mg/dL — ABNORMAL HIGH (ref 8–23)
CO2: 21 mmol/L — ABNORMAL LOW (ref 22–32)
Calcium: 11.3 mg/dL — ABNORMAL HIGH (ref 8.9–10.3)
Chloride: 117 mmol/L — ABNORMAL HIGH (ref 98–111)
Creatinine, Ser: 2.9 mg/dL — ABNORMAL HIGH (ref 0.44–1.00)
GFR calc Af Amer: 16 mL/min — ABNORMAL LOW (ref >60)
GFR calc non Af Amer: 14 mL/min — ABNORMAL LOW (ref >60)
Glucose, Bld: 246 mg/dL — ABNORMAL HIGH (ref 70–99)
Potassium: 4.7 mmol/L (ref 3.5–5.1)
Sodium: 154 mmol/L — ABNORMAL HIGH (ref 135–145)
Total Bilirubin: 0.6 mg/dL (ref 0.3–1.2)
Total Protein: 7.5 g/dL (ref 6.5–8.1)

## 2020-03-26 LAB — APTT: aPTT: 40 seconds — ABNORMAL HIGH (ref 24–36)

## 2020-03-26 LAB — LACTIC ACID, PLASMA
Lactic Acid, Venous: 3.1 mmol/L (ref 0.5–1.9)
Lactic Acid, Venous: 3.8 mmol/L (ref 0.5–1.9)

## 2020-03-26 LAB — CBC WITH DIFFERENTIAL/PLATELET
Abs Immature Granulocytes: 0.06 10*3/uL (ref 0.00–0.07)
Basophils Absolute: 0 10*3/uL (ref 0.0–0.1)
Basophils Relative: 0 %
Eosinophils Absolute: 0.1 10*3/uL (ref 0.0–0.5)
Eosinophils Relative: 1 %
HCT: 40.6 % (ref 36.0–46.0)
Hemoglobin: 12.9 g/dL (ref 12.0–15.0)
Immature Granulocytes: 1 %
Lymphocytes Relative: 17 %
Lymphs Abs: 2 10*3/uL (ref 0.7–4.0)
MCH: 28.4 pg (ref 26.0–34.0)
MCHC: 31.8 g/dL (ref 30.0–36.0)
MCV: 89.4 fL (ref 80.0–100.0)
Monocytes Absolute: 0.4 10*3/uL (ref 0.1–1.0)
Monocytes Relative: 4 %
Neutro Abs: 8.9 10*3/uL — ABNORMAL HIGH (ref 1.7–7.7)
Neutrophils Relative %: 77 %
Platelets: 385 10*3/uL (ref 150–400)
RBC: 4.54 MIL/uL (ref 3.87–5.11)
RDW: 15.7 % — ABNORMAL HIGH (ref 11.5–15.5)
WBC: 11.4 10*3/uL — ABNORMAL HIGH (ref 4.0–10.5)
nRBC: 0 % (ref 0.0–0.2)

## 2020-03-26 LAB — URINALYSIS, ROUTINE W REFLEX MICROSCOPIC
Bilirubin Urine: NEGATIVE
Glucose, UA: NEGATIVE mg/dL
Ketones, ur: NEGATIVE mg/dL
Leukocytes,Ua: NEGATIVE
Nitrite: NEGATIVE
Protein, ur: 100 mg/dL — AB
Specific Gravity, Urine: 1.014 (ref 1.005–1.030)
pH: 6 (ref 5.0–8.0)

## 2020-03-26 LAB — RESPIRATORY PANEL BY RT PCR (FLU A&B, COVID)
Influenza A by PCR: NEGATIVE
Influenza B by PCR: NEGATIVE
SARS Coronavirus 2 by RT PCR: NEGATIVE

## 2020-03-26 LAB — PROTIME-INR
INR: 1.4 — ABNORMAL HIGH (ref 0.8–1.2)
Prothrombin Time: 17 seconds — ABNORMAL HIGH (ref 11.4–15.2)

## 2020-03-26 LAB — BRAIN NATRIURETIC PEPTIDE: B Natriuretic Peptide: 102.9 pg/mL — ABNORMAL HIGH (ref 0.0–100.0)

## 2020-03-26 LAB — CBG MONITORING, ED: Glucose-Capillary: 215 mg/dL — ABNORMAL HIGH (ref 70–99)

## 2020-03-26 MED ORDER — LACTATED RINGERS IV BOLUS (SEPSIS)
1000.0000 mL | Freq: Once | INTRAVENOUS | Status: AC
  Administered 2020-03-26: 1000 mL via INTRAVENOUS

## 2020-03-26 MED ORDER — SODIUM CHLORIDE 0.9 % IV SOLN
500.0000 mg | INTRAVENOUS | Status: DC
  Administered 2020-03-26: 500 mg via INTRAVENOUS
  Filled 2020-03-26 (×2): qty 500

## 2020-03-26 MED ORDER — LACTATED RINGERS IV BOLUS (SEPSIS)
1000.0000 mL | Freq: Once | INTRAVENOUS | Status: AC
  Administered 2020-03-26: 23:00:00 1000 mL via INTRAVENOUS

## 2020-03-26 MED ORDER — LACTATED RINGERS IV BOLUS (SEPSIS)
500.0000 mL | Freq: Once | INTRAVENOUS | Status: AC
  Administered 2020-03-26: 23:00:00 500 mL via INTRAVENOUS

## 2020-03-26 MED ORDER — SODIUM CHLORIDE 0.9 % IV SOLN
2.0000 g | INTRAVENOUS | Status: DC
  Administered 2020-03-26: 19:00:00 2 g via INTRAVENOUS
  Filled 2020-03-26: qty 20

## 2020-03-26 NOTE — ED Notes (Signed)
Pt transported to CT ?

## 2020-03-26 NOTE — H&P (Signed)
History and Physical    Katherine [name] BRA:[id number] DOB: 11/07/1928 DOA: 03/26/2020  PCP: Celene Squibb, MD Patient coming from: Nursing home  Chief Complaint: Altered mental status, hypotension  HPI: Katherine [name] is a 86 y.o. 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  GI had recommended PPI twice daily indefinitely.  Cardiology had also evaluated the patient during this hospitalization and decision was made to hold Plavix indefinitely.  Now presenting from her nursing home for evaluation of increased somnolence and altered mental status from baseline per family and staff.  EMS reported hypotension with blood pressure in the 70s over 40s and rales in all lung fields.  She was placed on 10 L oxygen via nonrebreather.  Patient's daughter told ED provider that she would want the patient be fully treated with antibiotics and IV medications.  She also expressed that the patient would not want CPR or defibrillation in case of cardiac arrest.  Daughter also expressed that patient would not want intubation or mechanical ventilation if her respiratory status worsened.  NIPPV/BiPAP okay.    No history could be obtained from the patient given her altered mental status.  ED Course: Afebrile.  Hypotensive with blood pressure as low as 70s over 30s.  Not tachycardic, tachypneic, or hypoxic.  Labs showing WBC count 11.4.  Lactic acid 3.1 >3.8.  BNP 102.  Corrected sodium 156.  Bicarb 21, anion gap 16.  Blood glucose 246.   BUN 70, creatinine 2.9.  Calcium 11.3.   LFTs normal.  Baseline creatinine 1.0.  Blood culture x2 pending.  SARS-CoV-2 PCR test negative.  Influenza panel negative.  UA negative for ketones, negative nitrite, negative leukocytes, 6-10 WBCs, and rare bacteria.  Urine culture pending.  Chest x-ray showing low lung volumes with streaky bibasilar atelectasis or scarring.  Showing mild ethmoid sinus mucosal thickening and frothy secretions within the right sphenoid sinus.  No significant mastoid effusion.  Head CT negative for acute intracranial abnormality.  Patient received ceftriaxone, azithromycin, and 2.5 L LR boluses. Blood pressure improved after IV fluid resuscitation.  Review of Systems:  All systems reviewed and apart from history of presenting illness, are negative.  Past Medical History:  Diagnosis Date  . Acute bronchitis 12/16/2015  . Acute exacerbation of CHF (congestive heart failure) ([place]) 11/20/2016  . Acute metabolic encephalopathy 8/81/1031  . Anemia   . Anxiety   . Aortic valvular stenosis    moderate to severe  EF on 2D Echo 10/22/2012 65-70%  valve area of 1.2cm, and peak and mean gradients of 45 and 74mmHg  . Bronchospasm 12/16/2015  . Chalazion of right upper eyelid early June 2016   treating with cream, Rx by Dr. Merlyn Albert    . CHF (congestive heart failure) ([place])   . Congestive heart failure ([place]) 03/04/2010   Qualifier: Diagnosis of  By: Tamala Julian CMA, Claiborne Billings    . Coronary artery disease   . Dementia ([place])   . Diabetes mellitus without complication ([place]) 5/94/5859  . Diabetes mellitus, type 2 ([place])   . Dyspnea    chronic  . Easy fatigability   .  Gastric mass   . Gastric ulcer   . GERD (gastroesophageal reflux disease)   . Heart murmur   . Hernia of unspecified site of abdominal cavity without mention of obstruction or gangrene    hiatal  . Hyperlipidemia   . Hypertension   . Hypokalemia 11/23/2016  . Normal cardiac stress test 12/2013   normal stress nuclear study  . Osteoarthritis   . Pain due to  onychomycosis of toenails of both feet 06/04/2019  . Thyroid mass     Past Surgical History:  Procedure Laterality Date  . AORTIC VALVE SURGERY    . CATARACT EXTRACTION Bilateral   . CORONARY ARTERY BYPASS GRAFT     x4 in 2003  . ESOPHAGOGASTRODUODENOSCOPY (EGD) WITH PROPOFOL N/A 02/24/2020   Procedure: ESOPHAGOGASTRODUODENOSCOPY (EGD) WITH PROPOFOL;  Surgeon: Irene Shipper, MD;  Location: [facility];  Service: Gastroenterology;  Laterality: N/A;  . internal hemorrhoidectomy    . KNEE SURGERY Right   . LEFT AND RIGHT HEART CATHETERIZATION WITH CORONARY/GRAFT ANGIOGRAM N/A 03/24/2015   Procedure: LEFT AND RIGHT HEART CATHETERIZATION WITH Beatrix Fetters;  Surgeon: Burnell Blanks, MD;  Location: [facility] CATH LAB;  Service: Cardiovascular;  Laterality: N/A;  . TEE WITHOUT CARDIOVERSION N/A 05/26/2015   Procedure: TRANSESOPHAGEAL ECHOCARDIOGRAM (TEE);  Surgeon: Burnell Blanks, MD;  Location: [facility];  Service: Open Heart Surgery;  Laterality: N/A;  . TRANSCATHETER AORTIC VALVE REPLACEMENT, TRANSFEMORAL N/A 05/26/2015   Procedure: TRANSCATHETER AORTIC VALVE REPLACEMENT, TRANSFEMORAL;  Surgeon: Burnell Blanks, MD;  Location: [facility];  Service: Open Heart Surgery;  Laterality: N/A;  . TUBAL LIGATION    . uterine polypectomy       reports that she has never smoked. She has never used smokeless tobacco. She reports that she does not drink alcohol or use drugs.  Allergies  Allergen Reactions  . Tape Other (See Comments)    SKIN IS VERY THIN AND WILL TEAR EASILY!!    Family History  Problem Relation Age of Onset  . Diabetes Mother   . Heart disease Mother   . Stroke Mother   . Cerebral aneurysm Mother   . Stroke Father   . Heart attack Brother   . Colon cancer Neg Hx     Prior to Admission medications   Medication Sig Start Date End Date Taking? Authorizing Provider  acetaminophen (TYLENOL) 500 MG tablet Take 1,000 mg by mouth 2 (two) times daily. for pain. Do  not exceed 4gm of Tylenol in 24 hours   Yes [provider]  albuterol (VENTOLIN HFA) 108 (90 Base) MCG/ACT inhaler Inhale 1 puff into the lungs every 6 (six) hours as needed for wheezing or shortness of breath.   Yes [provider]  ALPRAZolam (XANAX) 0.25 MG tablet Take 1 tablet (0.25 mg total) by mouth See admin instructions. Take 1/4 tablet (0.125 mg) by mouth every morning and 1 tablet (0.5 mg) at night, may also take 1/4 tablet (0.125 mg) with lunch as needed for anxiety. Patient taking differently: Take 0.125 mg by mouth See admin instructions. Takes 0.125 mg every morning and 0.125 at lunch (as needed) for anxiety. 03/02/20  Yes Swayze, Ava, DO  ALPRAZolam (XANAX) 0.5 MG tablet Take 0.5 mg by mouth at bedtime.   Yes [provider]  atorvastatin (LIPITOR) 80 MG tablet Take 80 mg by mouth daily with lunch.    Yes [provider]  budesonide-formoterol (SYMBICORT) 80-4.5 MCG/ACT inhaler Inhale 2 puffs into the lungs 2 (two) times daily as  needed (shortness of breath/wheezing).   Yes [provider]  cetirizine (ZYRTEC) 10 MG tablet Take 10 mg by mouth daily as needed for allergies or rhinitis.   Yes [provider]  clopidogrel (PLAVIX) 75 MG tablet Take 1 tablet (75 mg total) by mouth daily with breakfast. 06/03/15  Yes Barrett, Rhonda G, PA-C  donepezil (ARICEPT) 10 MG tablet Take 10 mg by mouth at bedtime. 02/07/20  Yes [provider]  esomeprazole (NEXIUM) 20 MG capsule Take 20 mg by mouth daily.    Yes [provider]  furosemide (LASIX) 20 MG tablet TAKE 2 TABLETS BY MOUTH EVERY DAY Patient taking differently: Take 40 mg by mouth daily.  02/20/20  Yes Hilty, Nadean Corwin, MD  hydrALAZINE (APRESOLINE) 50 MG tablet Take 1 tablet (50 mg total) by mouth every 8 (eight) hours. Patient taking differently: Take 50 mg by mouth 3 (three) times daily.  06/03/15  Yes Barrett, Evelene Croon, PA-C  insulin detemir (LEVEMIR) 100 UNIT/ML  injection Inject 0.12 mLs (12 Units total) into the skin at bedtime. 03/02/20  Yes Swayze, Ava, DO  ipratropium (ATROVENT) 0.03 % nasal spray Place 1 spray into both nostrils daily as needed for rhinitis (sneezing).  11/04/19  Yes [provider]  Linaclotide (LINZESS) 145 MCG CAPS capsule Take 145 mcg by mouth daily as needed (for constipation).   Yes [provider]  lisinopril (PRINIVIL,ZESTRIL) 10 MG tablet Take 1 tablet (10 mg total) by mouth daily. 06/03/15  Yes Barrett, Evelene Croon, PA-C  loperamide (IMODIUM) 2 MG capsule Take 2 mg by mouth 2 (two) times daily as needed for diarrhea or loose stools.    Yes [provider]  Memantine HCl ER (NAMENDA XR) 28 MG CP24 Take 28 mg by mouth daily.    Yes [provider]  Menthol, Topical Analgesic, (BIOFREEZE) 4 % GEL Apply 1 application topically 2 (two) times daily as needed (for pain).   Yes [provider]  mirtazapine (REMERON) 15 MG tablet Take 15 mg by mouth at bedtime.   Yes [provider]  Multiple Vitamin (MULTIVITAMIN WITH MINERALS) TABS tablet Take 1 tablet by mouth daily.   Yes [provider]  Naphazoline HCl (CLEAR EYES OP) Place 1 drop into both eyes daily as needed (dry eyes).   Yes [provider]  nebivolol (BYSTOLIC) 10 MG tablet Take 10 mg by mouth daily.    Yes [provider]  NYSTATIN powder Apply 1 application topically 2 (two) times daily as needed for for breast care. 11/15/16  Yes [provider]  potassium chloride (KLOR-CON) 20 MEQ packet Take 20 mEq by mouth 2 (two) times daily.   Yes [provider]  sertraline (ZOLOFT) 25 MG tablet Take 25 mg by mouth daily.   Yes [provider]  lip balm (CARMEX) ointment Apply topically as needed for lip care. Patient not taking: Reported on 03/26/2020 03/02/20   Karie Kirks, DO    Physical Exam: Vitals:   03/26/20 2145 03/26/20 2245 03/27/20 0001 03/27/20 0045  BP: (!) 118/40  (!) 116/42 (!) 108/92 (!) 124/46  Pulse: 66 71 78 78  Resp: 19 15  (!) 27  Temp:      TempSrc:      SpO2: 100% 98% 99% 99%  Weight:      Height:        Physical Exam  Constitutional: No distress.  HENT:  Head: Normocephalic.  Very dry mucous membranes  Eyes: Right eye exhibits no  discharge. Left eye exhibits no discharge.  Cardiovascular: Normal rate, regular rhythm and intact distal pulses.  Pulmonary/Chest: Effort normal and breath sounds normal. No respiratory distress. She has no wheezes. She has no rales.  Abdominal: Soft. Bowel sounds are normal. She exhibits no distension. There is no abdominal tenderness. There is no guarding.  Musculoskeletal:        General: No edema.     Cervical back: Neck supple.  Neurological:  Somnolent Mumbles when her name is called Moving all extremities on command  Skin: Skin is warm and dry. She is not diaphoretic.     Labs on Admission: I have personally reviewed following labs and imaging studies  CBC: Recent Labs  Lab 03/26/20 1833  WBC 11.4*  NEUTROABS 8.9*  HGB 12.9  HCT 40.6  MCV 89.4  PLT 992   Basic Metabolic Panel: Recent Labs  Lab 03/26/20 1833  NA 154*  K 4.7  CL 117*  CO2 21*  GLUCOSE 246*  BUN 70*  CREATININE 2.90*  CALCIUM 11.3*   GFR: Estimated Creatinine Clearance: 11.8 mL/min (A) (by C-G formula based on SCr of 2.9 mg/dL (H)). Liver Function Tests: Recent Labs  Lab 03/26/20 1833  AST 37  ALT 32  ALKPHOS 109  BILITOT 0.6  PROT 7.5  ALBUMIN 3.8   No results for input(s): LIPASE, AMYLASE in the last 168 hours. No results for input(s): AMMONIA in the last 168 hours. Coagulation Profile: Recent Labs  Lab 03/26/20 2232  INR 1.4*   Cardiac Enzymes: No results for input(s): CKTOTAL, CKMB, CKMBINDEX, TROPONINI in the last 168 hours. BNP (last 3 results) No results for input(s): PROBNP in the last 8760 hours. HbA1C: No results for input(s): HGBA1C in the last 72 hours. CBG: Recent Labs    Lab 03/26/20 1746  GLUCAP 215*   Lipid Profile: No results for input(s): CHOL, HDL, LDLCALC, TRIG, CHOLHDL, LDLDIRECT in the last 72 hours. Thyroid Function Tests: No results for input(s): TSH, T4TOTAL, FREET4, T3FREE, THYROIDAB in the last 72 hours. Anemia Panel: No results for input(s): VITAMINB12, FOLATE, FERRITIN, TIBC, IRON, RETICCTPCT in the last 72 hours. Urine analysis:    Component Value Date/Time   COLORURINE AMBER (A) 03/26/2020 2117   APPEARANCEUR CLOUDY (A) 03/26/2020 2117   LABSPEC 1.014 03/26/2020 2117   PHURINE 6.0 03/26/2020 2117   GLUCOSEU NEGATIVE 03/26/2020 2117   HGBUR SMALL (A) 03/26/2020 2117   BILIRUBINUR NEGATIVE 03/26/2020 2117   [place] NEGATIVE 03/26/2020 2117   PROTEINUR 100 (A) 03/26/2020 2117   UROBILINOGEN 0.2 02/15/2014 1403   NITRITE NEGATIVE 03/26/2020 2117   LEUKOCYTESUR NEGATIVE 03/26/2020 2117    Radiological Exams on Admission: CT Head Wo Contrast  Result Date: 03/26/2020 CLINICAL DATA:  Altered mental status (AMS), unclear cause. Additional history provided: Patient presents via ambulance 4 lethargy. EXAM: CT HEAD WITHOUT CONTRAST TECHNIQUE: Contiguous axial images were obtained from the base of the skull through the vertex without intravenous contrast. COMPARISON:  Head CT 06/26/2018 FINDINGS: Brain: There is no evidence of acute intracranial hemorrhage, intracranial mass, midline shift or extra-axial fluid collection.No demarcated cortical infarction. Moderate patchy and confluent hypoattenuation within the cerebral white matter is nonspecific, but consistent with chronic small vessel ischemic disease. Findings are similar to prior head CT 06/26/2018. Stable, moderate generalized parenchymal atrophy. Vascular: No hyperdense vessel. Atherosclerotic calcifications Skull: Normal. Negative for fracture or focal lesion. Sinuses/Orbits: Visualized orbits demonstrate no acute abnormality. Mild ethmoid sinus mucosal thickening. Frothy secretions  within the right sphenoid sinus. No significant  mastoid effusion. IMPRESSION: 1. No evidence of acute intracranial abnormality. 2. Stable moderate generalized parenchymal atrophy and chronic small vessel ischemic disease. 3. Ethmoid and right sphenoid sinusitis. Electronically Signed   By: Kellie Simmering DO   On: 03/26/2020 22:19   DG Chest Port 1 View  Result Date: 03/26/2020 CLINICAL DATA:  Shortness of breath and lethargy. EXAM: PORTABLE CHEST 1 VIEW COMPARISON:  Radiograph 06/26/2018 FINDINGS: Post median sternotomy and TAVR. Cardiomegaly is similar to prior exam allowing for differences in technique. Aortic tortuosity and atherosclerosis. Overall low lung volumes. Streaky bibasilar atelectasis or scarring. No pulmonary edema, confluent airspace disease, large pleural effusion or pneumothorax. Calcified granuloma at the right lung apex. IMPRESSION: 1. Low lung volumes with streaky bibasilar atelectasis or scarring. 2. Stable cardiomegaly. Electronically Signed   By: Keith Rake M.D.   On: 03/26/2020 19:15    EKG: Independently reviewed.  Sinus rhythm, no significant change since prior tracing.  Assessment/Plan Principal Problem:   Severe sepsis (HCC) Active Problems:   Acute metabolic encephalopathy   AKI (acute kidney injury) ([place])   Hypernatremia   Suspected severe sepsis secondary to possible CAP vs UTI: Hypotensive on arrival to the ED, now improved after IV fluid boluses.  Afebrile.  Not tachycardic, tachypneic, or hypoxic.  Labs showing no significant leukocytosis.  Lactic acid elevated 3.1 >3.8, although severe dehydration also likely contributing.  She has very dry mucous membranes on exam.  Chest x-ray showing streaky bibasilar atelectasis or scarring.  SARS-CoV-2 PCR test negative.  Influenza panel negative.  UA with 6-10 WBCs and rare bacteria. -Continue ceftriaxone and azithromycin.  Received IV fluid boluses and blood pressure now improved.  Continue IV fluid hydration.  Repeat  lactic acid level.  Check procalcitonin level.  Continue to monitor WBC count.  Blood culture x2 pending.  Urine culture pending.  AKI: Suspect prerenal due to severe dehydration/hypotension.  BUN 70, creatinine 2.9.  Baseline creatinine 1.0.  Continue IV fluid hydration.  Avoid nephrotoxic agents/contrast.  Monitor renal function and urine output.  Order renal ultrasound.  Acute metabolic encephalopathy in the setting of advanced dementia: It is reported that patient is AAO x1 at baseline.  Currently somnolent but does mumble when her name is called and moving all extremities on command.  Head CT negative for acute intracranial abnormality.  Suspect her somnolence/altered mental status is related to underlying infection, AKI, hypernatremia, and severe dehydration.  Continue IV fluid and antibiotics as mentioned above.  Ethmoid and sphenoid sinusitis: CT showing mild ethmoid sinus mucosal thickening and frothy secretions within the right sphenoid sinus.  No significant mastoid effusion.  Continue antibiotics as above.  Hypovolemic hypernatremia: Suspect related to severe dehydration.  Corrected sodium 156 on initial labs.  She received IV fluid boluses for hypotension.  Repeat BMP and continue to monitor sodium level every 4 hours.  Mild high anion gap metabolic acidosis: Bicarb 21, anion gap 16.  Suspect related to lactic acidosis.  Received IV fluid boluses.  Repeat lactate and BMP.  Hyperglycemia in the setting of insulin-dependent diabetes: A1c 6.1 on 3/20.  Order sliding scale insulin ACHS and CBG checks.  Hypercalcemia: Calcium 11.3.  Received IV fluid boluses.  Check ionized calcium level.  Hypertension: Hold home antihypertensives at this time.  Resume home p.o. medications when patient is more awake and alert.  We will keep n.p.o. at this time and follow aspiration precautions.  DVT prophylaxis: Subcutaneous heparin Code Status: Limited code Family Communication: No family available at  this time.  Disposition Plan: Anticipate discharge after clinical improvement. Consults called: None Admission status: It is my clinical opinion that admission to INPATIENT is reasonable and necessary because of the expectation that this patient will require hospital care that crosses at least 2 midnights to treat this condition based on the medical complexity of the problems presented.  Given the aforementioned information, the predictability of an adverse outcome is felt to be significant.  The medical decision making on this patient was of high complexity and the patient is at high risk for clinical deterioration, therefore this is a level 3 visit.  Shela Leff MD [facility]  If 7PM-7AM, please contact night-coverage [email or web address]  03/27/2020, 1:28 AM

## 2020-03-26 NOTE — ED Provider Notes (Signed)
I saw and evaluated the patient, reviewed the resident's note and I agree with the findings and plan.  Pertinent History: This patient is a 86 year old female, presents from a nursing facility with altered mental status, very lethargic, found to be hypotensive hypoxic and not breathing very well.  She was placed on a 10 L nonrebreather by paramedics.  Additional history obtained from the medical record and the paramedics.  The patient does have a DO NOT RESUSCITATE order.  The patient had recently been admitted for gastrointestinal bleeding, acute blood loss anemia, she has a known history of a gastric tumor, acute kidney injury, she has advanced dementia and has had prior cardiac surgery.  She is not able to give me any information, level 5 caveat applies due to her level of illness  Pertinent Exam findings: This patient has a soft abdomen, no edema, she is very difficult to arouse but she will mumble yes or no, she will open her eyes to command, she will wiggle her fingers or toes but very slowly and weakly.  She has a soft murmur, her vital signs are hypotensive, she has a pulse of 60.  She is ill-appearing and critically ill-appearing.  She has some rales with breathing and trying to clear mucus that is difficult to clear.  A very weak cough.  I was personally present and directly supervised the following procedures:  Medical evaluation and resuscitation of this critically ill patient Consider that this patient could have sepsis and thus a code sepsis will be activated, IV placed, antibiotics for possible pneumonia, the patient does have some rales  This patient has hypernatremia, hyperchloremia, hyperglycemia, acute kidney injury with a creatinine of 2.9 which is far above her baseline as well as a BUN of 70.  She has had an elevated lactic acid, all of this could be related to dehydration, this could be related to sepsis with an acute kidney injury, she at the minimum has severe sepsis.  Antibiotics  were given, IV fluids were given, the patient will need to be admitted to the hospital.  Her blood pressure has significantly improved and is currently 110/43.  .Critical Care Performed by: Noemi Chapel, MD Authorized by: Noemi Chapel, MD   Critical care provider statement:    Critical care time (minutes):  35   Critical care time was exclusive of:  Separately billable procedures and treating other patients and teaching time   Critical care was necessary to treat or prevent imminent or life-threatening deterioration of the following conditions:  Sepsis and CNS failure or compromise   Critical care was time spent personally by me on the following activities:  Blood draw for specimens, development of treatment plan with patient or surrogate, discussions with consultants, evaluation of patient's response to treatment, examination of patient, obtaining history from patient or surrogate, ordering and performing treatments and interventions, ordering and review of laboratory studies, ordering and review of radiographic studies, pulse oximetry, re-evaluation of patient's condition and review of old charts    I personally interpreted the EKG as well as the resident and agree with the interpretation on the resident's chart.  Final diagnoses:  Sepsis, due to unspecified organism, unspecified whether acute organ dysfunction present ([place])  Hypernatremia  Hyperchloremia  AKI (acute kidney injury) ([place])  Altered mental status, unspecified altered mental status type  Hyperglycemia      Noemi Chapel, MD 03/28/20 [phone]

## 2020-03-26 NOTE — ED Triage Notes (Signed)
Pt bib ems from [facility] after pt more lethargic than normal. On scene pt bp 70/40. Pt with rales in all fields, placed on 10L NRB. 22g LFA.

## 2020-03-26 NOTE — ED Provider Notes (Signed)
[name] Provider Note   CSN: [id number] Arrival date & time: 03/26/20  1739     History Chief Complaint  Patient presents with  . Fatigue    [name] is a 86 y.o. female.  HPI   86 year old female with a past medical history of chronic diastolic heart failure, dementia with waxing and waning orientation and generally A&O x1, type 2 diabetes, HLD, HTN presenting to the emergency department brought in by EMS from [facility] with concerns for increased somnolence and altered mental status from baseline per family and staff.  The patient's change in mental status began this morning.  Facility staff and family did not note additional symptoms apart from moderately increased shortness of breath.  EMS noted the patient was hypotensive in the 70s over 40s with rales in all lung fields subsequently placed on 10 L nonrebreather.  Patient presents with a MOST form.  Per daughter, the patient became less responsive with abnormal breathing.  The daughter noted that the patient tends to become very altered when she has a urinary tract infection.  She did not report that the patient has had any fevers, change in breathing status, cough, congestion, rhinorrhea, Covid exposures, abdominal pain or changes in her urination prior to today.  She expressed that she would want the patient fully treated with antibiotics and IV medications as well as admission to the hospital should the patient need it.  She also expressed that the patient would not want CPR or electric shocks to the heart restart her heart should stop.  She would also not want mechanical ventilation should her breathing worsen to the point that she would need a breathing tube and mechanical support.  Past Medical History:  Diagnosis Date  . Acute bronchitis 12/16/2015  . Acute exacerbation of CHF (congestive heart failure) ([place]) 11/20/2016  . Acute metabolic  encephalopathy 1/95/0932  . Anemia   . Anxiety   . Aortic valvular stenosis    moderate to severe  EF on 2D Echo 10/22/2012 65-70%  valve area of 1.2cm, and peak and mean gradients of 45 and 39mmHg  . Bronchospasm 12/16/2015  . Chalazion of right upper eyelid early June 2016   treating with cream, Rx by Dr. Merlyn Albert    . CHF (congestive heart failure) ([place])   . Congestive heart failure ([place]) 03/04/2010   Qualifier: Diagnosis of  By: Tamala Julian CMA, Claiborne Billings    . Coronary artery disease   . Dementia ([place])   . Diabetes mellitus without complication ([place]) 6/71/2458  . Diabetes mellitus, type 2 ([place])   . Dyspnea    chronic  . Easy fatigability   . Gastric mass   . Gastric ulcer   . GERD (gastroesophageal reflux disease)   . Heart murmur   . Hernia of unspecified site of abdominal cavity without mention of obstruction or gangrene    hiatal  . Hyperlipidemia   . Hypertension   . Hypokalemia 11/23/2016  . Normal cardiac stress test 12/2013   normal stress nuclear study  . Osteoarthritis   . Pain due to onychomycosis of toenails of both feet 06/04/2019  . Thyroid mass     Patient Active Problem List   Diagnosis Date Noted  . Sepsis ([place]) 03/26/2020  . Palliative care by specialist   . Goals of care, counseling/discussion   . Esophageal ring   . Heme positive stool   . GI bleed 02/22/2020  . Advanced dementia ([name]) 02/22/2020  .  Coagulation disorder ([place]) 06/04/2019  . Urinary tract infection 06/26/2018  . Chronic diastolic CHF (congestive heart failure) ([place]) 06/26/2018  . Cough 11/23/2016  . Chronic diastolic heart failure ([place])   . S/p TAVR (transcatheter aortic valve replacement), bioprosthetic 09/02/2015  . Constipation 06/04/2015  . Postoperative pulmonary edema ([place]) 06/04/2015  . Severe aortic stenosis   . Severe aortic valve stenosis 05/26/2015  . Bilateral leg edema 10/17/2014  . CHF with left ventricular diastolic dysfunction, NYHA class 2 ([place]) 09/09/2014  . Difficulty  walking 10/28/2013  . Muscle weakness (generalized) 10/28/2013  . Morbid obesity ([place]) 10/24/2013  . Moderate to severe aortic stenosis 04/22/2013  . S/P CABG x 4 04/22/2013  . Gastric mass 08/18/2011  . Diabetes mellitus, type 2 ([place])   . COLONIC POLYPS, HX OF 03/04/2010  . THYROID MASS 02/25/2010  . Diabetes mellitus type 2, uncontrolled ([place]) 02/25/2010  . Hyperlipidemia 02/25/2010  . UNSPECIFIED ANEMIA 02/25/2010  . Anxiety state 02/25/2010  . Essential hypertension 02/25/2010  . Coronary atherosclerosis 02/25/2010  . REFLUX ESOPHAGITIS 02/25/2010  . HIATAL HERNIA 02/25/2010  . OSTEOARTHRITIS 02/25/2010  . History of peptic ulcer disease 02/25/2010    Past Surgical History:  Procedure Laterality Date  . AORTIC VALVE SURGERY    . CATARACT EXTRACTION Bilateral   . CORONARY ARTERY BYPASS GRAFT     x4 in 2003  . ESOPHAGOGASTRODUODENOSCOPY (EGD) WITH PROPOFOL N/A 02/24/2020   Procedure: ESOPHAGOGASTRODUODENOSCOPY (EGD) WITH PROPOFOL;  Surgeon: Irene Shipper, MD;  Location: [place];  Service: Gastroenterology;  Laterality: N/A;  . internal hemorrhoidectomy    . KNEE SURGERY Right   . LEFT AND RIGHT HEART CATHETERIZATION WITH CORONARY/GRAFT ANGIOGRAM N/A 03/24/2015   Procedure: LEFT AND RIGHT HEART CATHETERIZATION WITH Beatrix Fetters;  Surgeon: Burnell Blanks, MD;  Location: [facility] CATH LAB;  Service: Cardiovascular;  Laterality: N/A;  . TEE WITHOUT CARDIOVERSION N/A 05/26/2015   Procedure: TRANSESOPHAGEAL ECHOCARDIOGRAM (TEE);  Surgeon: Burnell Blanks, MD;  Location: [place];  Service: Open Heart Surgery;  Laterality: N/A;  . TRANSCATHETER AORTIC VALVE REPLACEMENT, TRANSFEMORAL N/A 05/26/2015   Procedure: TRANSCATHETER AORTIC VALVE REPLACEMENT, TRANSFEMORAL;  Surgeon: Burnell Blanks, MD;  Location: [place];  Service: Open Heart Surgery;  Laterality: N/A;  . TUBAL LIGATION    . uterine polypectomy       OB History   No obstetric history on file.      Family History  Problem Relation Age of Onset  . Diabetes Mother   . Heart disease Mother   . Stroke Mother   . Cerebral aneurysm Mother   . Stroke Father   . Heart attack Brother   . Colon cancer Neg Hx     Social History   Tobacco Use  . Smoking status: Never Smoker  . Smokeless tobacco: Never Used  Substance Use Topics  . Alcohol use: No  . Drug use: No    Home Medications Prior to Admission medications   Medication Sig Start Date End Date Taking? Authorizing Provider  acetaminophen (TYLENOL) 500 MG tablet Take 1,000 mg by mouth 2 (two) times daily. for pain. Do not exceed 4gm of Tylenol in 24 hours   Yes [provider]  albuterol (VENTOLIN HFA) 108 (90 Base) MCG/ACT inhaler Inhale 1 puff into the lungs every 6 (six) hours as needed for wheezing or shortness of breath.   Yes [provider]  ALPRAZolam (XANAX) 0.25 MG tablet Take 1 tablet (0.25 mg total) by mouth See admin instructions. Take 1/4  tablet (0.125 mg) by mouth every morning and 1 tablet (0.5 mg) at night, may also take 1/4 tablet (0.125 mg) with lunch as needed for anxiety. Patient taking differently: Take 0.125 mg by mouth See admin instructions. Takes 0.125 mg every morning and 0.125 at lunch (as needed) for anxiety. 03/02/20  Yes Swayze, Ava, DO  ALPRAZolam (XANAX) 0.5 MG tablet Take 0.5 mg by mouth at bedtime.   Yes [provider]  atorvastatin (LIPITOR) 80 MG tablet Take 80 mg by mouth daily with lunch.    Yes [provider]  budesonide-formoterol (SYMBICORT) 80-4.5 MCG/ACT inhaler Inhale 2 puffs into the lungs 2 (two) times daily as needed (shortness of breath/wheezing).   Yes [provider]  cetirizine (ZYRTEC) 10 MG tablet Take 10 mg by mouth daily as needed for allergies or rhinitis.   Yes [provider]  clopidogrel (PLAVIX) 75 MG tablet Take 1 tablet (75 mg total) by mouth daily with breakfast. 06/03/15  Yes Barrett, Rhonda G, PA-C  donepezil  (ARICEPT) 10 MG tablet Take 10 mg by mouth at bedtime. 02/07/20  Yes [provider]  esomeprazole (NEXIUM) 20 MG capsule Take 20 mg by mouth daily.    Yes [provider]  furosemide (LASIX) 20 MG tablet TAKE 2 TABLETS BY MOUTH EVERY DAY Patient taking differently: Take 40 mg by mouth daily.  02/20/20  Yes Hilty, Nadean Corwin, MD  hydrALAZINE (APRESOLINE) 50 MG tablet Take 1 tablet (50 mg total) by mouth every 8 (eight) hours. Patient taking differently: Take 50 mg by mouth 3 (three) times daily.  06/03/15  Yes Barrett, Evelene Croon, PA-C  insulin detemir (LEVEMIR) 100 UNIT/ML injection Inject 0.12 mLs (12 Units total) into the skin at bedtime. 03/02/20  Yes Swayze, Ava, DO  ipratropium (ATROVENT) 0.03 % nasal spray Place 1 spray into both nostrils daily as needed for rhinitis (sneezing).  11/04/19  Yes [provider]  Linaclotide (LINZESS) 145 MCG CAPS capsule Take 145 mcg by mouth daily as needed (for constipation).   Yes [provider]  lisinopril (PRINIVIL,ZESTRIL) 10 MG tablet Take 1 tablet (10 mg total) by mouth daily. 06/03/15  Yes Barrett, Evelene Croon, PA-C  loperamide (IMODIUM) 2 MG capsule Take 2 mg by mouth 2 (two) times daily as needed for diarrhea or loose stools.    Yes [provider]  Memantine HCl ER (NAMENDA XR) 28 MG CP24 Take 28 mg by mouth daily.    Yes [provider]  Menthol, Topical Analgesic, (BIOFREEZE) 4 % GEL Apply 1 application topically 2 (two) times daily as needed (for pain).   Yes [provider]  mirtazapine (REMERON) 15 MG tablet Take 15 mg by mouth at bedtime.   Yes [provider]  Multiple Vitamin (MULTIVITAMIN WITH MINERALS) TABS tablet Take 1 tablet by mouth daily.   Yes [provider]  Naphazoline HCl (CLEAR EYES OP) Place 1 drop into both eyes daily as needed (dry eyes).   Yes [provider]  nebivolol (BYSTOLIC) 10 MG tablet Take 10 mg by mouth daily.    Yes [provider]  NYSTATIN powder Apply 1 application topically 2 (two) times daily as needed for for breast care. 11/15/16  Yes [provider]  potassium chloride (KLOR-CON) 20 MEQ packet Take 20 mEq by mouth 2 (two) times daily.   Yes [provider]  sertraline (ZOLOFT) 25 MG tablet Take 25 mg by mouth daily.   Yes [provider]  lip balm (CARMEX)  ointment Apply topically as needed for lip care. Patient not taking: Reported on 03/26/2020 03/02/20   Swayze, Ava, DO    Allergies    Tape  Review of Systems   Review of Systems  Unable to perform ROS: Dementia    Physical Exam Updated Vital Signs BP (!) 118/40   Pulse 66   Temp 98.3 F (36.8 C) (Rectal)   Resp 19   Ht 5' (1.524 m)   Wt 77.1 kg   SpO2 100%   BMI 33.20 kg/m   Physical Exam Vitals and nursing note reviewed.  Constitutional:      General: She is not in acute distress.    Appearance: Normal appearance. She is obese. She is not ill-appearing.  HENT:     Head: Normocephalic.     Right Ear: External ear normal.     Left Ear: External ear normal.     Nose: Nose normal.     Mouth/Throat:     Mouth: Mucous membranes are moist.     Pharynx: Oropharynx is clear.  Eyes:     Extraocular Movements: Extraocular movements intact.  Cardiovascular:     Rate and Rhythm: Normal rate and regular rhythm.     Heart sounds: Murmur present.  Pulmonary:     Effort: Pulmonary effort is normal. No respiratory distress.     Breath sounds: Examination of the right-lower field reveals rales. Examination of the left-lower field reveals rales. Rales present. No wheezing or rhonchi.  Abdominal:     General: Bowel sounds are normal.     Palpations: Abdomen is soft.     Tenderness: There is no abdominal tenderness. There is no guarding.  Musculoskeletal:     Cervical back: Normal range of motion.     Right lower leg: Edema present.     Left lower leg: Edema present.  Skin:    General: Skin is warm and dry.      Capillary Refill: Capillary refill takes less than 2 seconds.  Neurological:     General: No focal deficit present.     Mental Status: She is alert and oriented to person, place, and time. Mental status is at baseline.     GCS: GCS eye subscore is 4. GCS verbal subscore is 1. GCS motor subscore is 4.  Psychiatric:        Mood and Affect: Mood normal.     ED Results / Procedures / Treatments   Labs (all labs ordered are listed, but only abnormal results are displayed) Labs Reviewed  LACTIC ACID, PLASMA - Abnormal; Notable for the following components:      Result Value   Lactic Acid, Venous 3.1 (*)    All other components within normal limits  COMPREHENSIVE METABOLIC PANEL - Abnormal; Notable for the following components:   Sodium 154 (*)    Chloride 117 (*)    CO2 21 (*)    Glucose, Bld 246 (*)    BUN 70 (*)    Creatinine, Ser 2.90 (*)    Calcium 11.3 (*)    GFR calc non Af Amer 14 (*)    GFR calc Af Amer 16 (*)    Anion gap 16 (*)    All other components within normal limits  CBC WITH DIFFERENTIAL/PLATELET - Abnormal; Notable for the following components:   WBC 11.4 (*)    RDW 15.7 (*)    Neutro Abs 8.9 (*)    All other components within normal limits  URINALYSIS, ROUTINE W REFLEX  MICROSCOPIC - Abnormal; Notable for the following components:   Color, Urine AMBER (*)    APPearance CLOUDY (*)    Hgb urine dipstick SMALL (*)    Protein, ur 100 (*)    Bacteria, UA RARE (*)    Non Squamous Epithelial 0-5 (*)    All other components within normal limits  BRAIN NATRIURETIC PEPTIDE - Abnormal; Notable for the following components:   B Natriuretic Peptide 102.9 (*)    All other components within normal limits  CBG MONITORING, ED - Abnormal; Notable for the following components:   Glucose-Capillary 215 (*)    All other components within normal limits  RESPIRATORY PANEL BY RT PCR (FLU A&B, COVID)  CULTURE, BLOOD (ROUTINE X 2)  CULTURE, BLOOD (ROUTINE X 2)  URINE CULTURE    LACTIC ACID, PLASMA  PROTIME-INR  APTT  I-STAT VENOUS BLOOD GAS, ED    EKG None  Radiology CT Head Wo Contrast  Result Date: 03/26/2020 CLINICAL DATA:  Altered mental status (AMS), unclear cause. Additional history provided: Patient presents via ambulance 4 lethargy. EXAM: CT HEAD WITHOUT CONTRAST TECHNIQUE: Contiguous axial images were obtained from the base of the skull through the vertex without intravenous contrast. COMPARISON:  Head CT 06/26/2018 FINDINGS: Brain: There is no evidence of acute intracranial hemorrhage, intracranial mass, midline shift or extra-axial fluid collection.No demarcated cortical infarction. Moderate patchy and confluent hypoattenuation within the cerebral white matter is nonspecific, but consistent with chronic small vessel ischemic disease. Findings are similar to prior head CT 06/26/2018. Stable, moderate generalized parenchymal atrophy. Vascular: No hyperdense vessel. Atherosclerotic calcifications Skull: Normal. Negative for fracture or focal lesion. Sinuses/Orbits: Visualized orbits demonstrate no acute abnormality. Mild ethmoid sinus mucosal thickening. Frothy secretions within the right sphenoid sinus. No significant mastoid effusion. IMPRESSION: 1. No evidence of acute intracranial abnormality. 2. Stable moderate generalized parenchymal atrophy and chronic small vessel ischemic disease. 3. Ethmoid and right sphenoid sinusitis. Electronically Signed   By: Kellie Simmering DO   On: 03/26/2020 22:19   DG Chest Port 1 View  Result Date: 03/26/2020 CLINICAL DATA:  Shortness of breath and lethargy. EXAM: PORTABLE CHEST 1 VIEW COMPARISON:  Radiograph 06/26/2018 FINDINGS: Post median sternotomy and TAVR. Cardiomegaly is similar to prior exam allowing for differences in technique. Aortic tortuosity and atherosclerosis. Overall low lung volumes. Streaky bibasilar atelectasis or scarring. No pulmonary edema, confluent airspace disease, large pleural effusion or pneumothorax.  Calcified granuloma at the right lung apex. IMPRESSION: 1. Low lung volumes with streaky bibasilar atelectasis or scarring. 2. Stable cardiomegaly. Electronically Signed   By: Keith Rake M.D.   On: 03/26/2020 19:15    Procedures Procedures (including critical care time)  Medications Ordered in ED Medications  cefTRIAXone (ROCEPHIN) 2 g in sodium chloride 0.9 % 100 mL IVPB (0 g Intravenous Stopped 03/26/20 2025)  azithromycin (ZITHROMAX) 500 mg in sodium chloride 0.9 % 250 mL IVPB (0 mg Intravenous Stopped 03/26/20 2025)  lactated ringers bolus 1,000 mL (0 mLs Intravenous Stopped 03/26/20 2156)    And  lactated ringers bolus 1,000 mL (1,000 mLs Intravenous New Bag/Given 03/26/20 2250)    And  lactated ringers bolus 500 mL (500 mLs Intravenous New Bag/Given 03/26/20 2232)    ED Course  I have reviewed the triage vital signs and the nursing notes.  Pertinent labs & imaging results that were available during my care of the patient were reviewed by me and considered in my medical decision making (see chart for details).    MDM Rules/Calculators/A&P  86 year old female with a past medical history of chronic diastolic heart failure, dementia with waxing and waning orientation and generally A&O x1, type 2 diabetes, HLD, HTN presenting to the emergency department brought in by EMS from [facility] facility with concerns for increased somnolence and altered mental status from baseline per family and staff.   Differential diagnoses considered include hyper or hypothyroidism, sepsis, dysrhythmia, hyperammonemia, hypercapnia, hypoxia, pneumonia, UTI  Initial interventions included obtaining blood cultures prior to administration of broad-spectrum antibiotics for presumed community-acquired pneumonia with ceftriaxone and azithromycin.  30 cc/kg bolus of LR.  ECG interpreted by me demonstrated normal sinus rhythm at 64 bpm, normal axis, normal intervals, LVH, no  ST or T wave changes suggestive of acute ischemia overall unchanged compared to previous compared on 06/26/2018  CXR interpreted by me demonstrated low lung volumes with streaky bibasilar atelectasis or scarring  Labs demonstrated 20 care glucose of 215, leukocytosis to 11.4 with slight left shift, hemoglobin 12.9, platelets 385, venous lactic acid elevation to 3.1, BNP mildly elevated to 102.9, Covid PCR negative, hypernatremia to 154, hyperchloremia to 117, diminished bicarb 21, serum hyperglycemia 246, elevated BUN to 70 with a creatinine elevation of 2.90, hypercalcemia 1.3, anion gap of 16  Upon reassessment the patient's blood pressure is appear to improve to the low 194R systolic following approximately half of the patient's 30 cc/kg fluid bolus.  Patient's laboratory studies reflect a likely prerenal azotemia with additional electrolyte derangements suggestive of hypovolemia.  CT head negative for any acute intracranial abnormalities  Given the above findings, my suspicion is that patient likely has sepsis secondary to either CAP or cystitis with appropriate coverage with both ceftriaxone and azithromycin  We will admit to hospitalist for ongoing evaluation, monitoring and management as well as coordination of care  The plan for this patient was discussed with Dr. Sabra Heck, who voiced agreement and who oversaw evaluation and treatment of this patient.  Final Clinical Impression(s) / ED Diagnoses Final diagnoses:  Sepsis, due to unspecified organism, unspecified whether acute organ dysfunction present ([place])  Hypernatremia  Hyperchloremia  AKI (acute kidney injury) ([place])  Altered mental status, unspecified altered mental status type  Hyperglycemia    Rx / DC Orders ED Discharge Orders    None       Filbert Berthold, MD 03/26/20 2249    Noemi Chapel, MD 03/28/20 [phone]

## 2020-03-27 ENCOUNTER — Inpatient Hospital Stay (HOSPITAL_COMMUNITY): Payer: Medicare Other

## 2020-03-27 DIAGNOSIS — R6520 Severe sepsis without septic shock: Secondary | ICD-10-CM | POA: Diagnosis not present

## 2020-03-27 DIAGNOSIS — A419 Sepsis, unspecified organism: Secondary | ICD-10-CM | POA: Diagnosis not present

## 2020-03-27 DIAGNOSIS — E870 Hyperosmolality and hypernatremia: Secondary | ICD-10-CM | POA: Diagnosis present

## 2020-03-27 DIAGNOSIS — Z515 Encounter for palliative care: Secondary | ICD-10-CM | POA: Diagnosis not present

## 2020-03-27 DIAGNOSIS — Z7189 Other specified counseling: Secondary | ICD-10-CM

## 2020-03-27 DIAGNOSIS — Z66 Do not resuscitate: Secondary | ICD-10-CM

## 2020-03-27 DIAGNOSIS — N179 Acute kidney failure, unspecified: Secondary | ICD-10-CM | POA: Diagnosis present

## 2020-03-27 LAB — CBC
HCT: 32.1 % — ABNORMAL LOW (ref 36.0–46.0)
Hemoglobin: 10.3 g/dL — ABNORMAL LOW (ref 12.0–15.0)
MCH: 28.4 pg (ref 26.0–34.0)
MCHC: 32.1 g/dL (ref 30.0–36.0)
MCV: 88.4 fL (ref 80.0–100.0)
Platelets: 280 10*3/uL (ref 150–400)
RBC: 3.63 MIL/uL — ABNORMAL LOW (ref 3.87–5.11)
RDW: 15.3 % (ref 11.5–15.5)
WBC: 14.7 10*3/uL — ABNORMAL HIGH (ref 4.0–10.5)
nRBC: 0 % (ref 0.0–0.2)

## 2020-03-27 LAB — BASIC METABOLIC PANEL
Anion gap: 14 (ref 5–15)
Anion gap: 13 (ref 5–15)
Anion gap: 9 (ref 5–15)
BUN: 71 mg/dL — ABNORMAL HIGH (ref 8–23)
BUN: 70 mg/dL — ABNORMAL HIGH (ref 8–23)
BUN: 68 mg/dL — ABNORMAL HIGH (ref 8–23)
CO2: 22 mmol/L (ref 22–32)
CO2: 22 mmol/L (ref 22–32)
CO2: 24 mmol/L (ref 22–32)
Calcium: 10.3 mg/dL (ref 8.9–10.3)
Calcium: 10.4 mg/dL — ABNORMAL HIGH (ref 8.9–10.3)
Calcium: 10.1 mg/dL (ref 8.9–10.3)
Chloride: 124 mmol/L — ABNORMAL HIGH (ref 98–111)
Chloride: 126 mmol/L — ABNORMAL HIGH (ref 98–111)
Chloride: 127 mmol/L — ABNORMAL HIGH (ref 98–111)
Creatinine, Ser: 2.55 mg/dL — ABNORMAL HIGH (ref 0.44–1.00)
Creatinine, Ser: 2.42 mg/dL — ABNORMAL HIGH (ref 0.44–1.00)
Creatinine, Ser: 2.26 mg/dL — ABNORMAL HIGH (ref 0.44–1.00)
GFR calc Af Amer: 19 mL/min — ABNORMAL LOW (ref >60)
GFR calc Af Amer: 20 mL/min — ABNORMAL LOW (ref >60)
GFR calc Af Amer: 21 mL/min — ABNORMAL LOW (ref >60)
GFR calc non Af Amer: 16 mL/min — ABNORMAL LOW (ref >60)
GFR calc non Af Amer: 17 mL/min — ABNORMAL LOW (ref >60)
GFR calc non Af Amer: 18 mL/min — ABNORMAL LOW (ref >60)
Glucose, Bld: 185 mg/dL — ABNORMAL HIGH (ref 70–99)
Glucose, Bld: 164 mg/dL — ABNORMAL HIGH (ref 70–99)
Glucose, Bld: 163 mg/dL — ABNORMAL HIGH (ref 70–99)
Potassium: 4 mmol/L (ref 3.5–5.1)
Potassium: 3.9 mmol/L (ref 3.5–5.1)
Potassium: 3.8 mmol/L (ref 3.5–5.1)
Sodium: 160 mmol/L — ABNORMAL HIGH (ref 135–145)
Sodium: 161 mmol/L (ref 135–145)
Sodium: 160 mmol/L — ABNORMAL HIGH (ref 135–145)

## 2020-03-27 LAB — GLUCOSE, CAPILLARY
Glucose-Capillary: 136 mg/dL — ABNORMAL HIGH (ref 70–99)
Glucose-Capillary: 148 mg/dL — ABNORMAL HIGH (ref 70–99)

## 2020-03-27 LAB — LACTIC ACID, PLASMA: Lactic Acid, Venous: 2.6 mmol/L (ref 0.5–1.9)

## 2020-03-27 LAB — MRSA PCR SCREENING: MRSA by PCR: POSITIVE — AB

## 2020-03-27 LAB — PROCALCITONIN: Procalcitonin: 0.12 ng/mL

## 2020-03-27 MED ORDER — BISACODYL 10 MG RE SUPP: 10.0000 mg | Freq: Every day | RECTAL | Status: DC | PRN

## 2020-03-27 MED ORDER — ONDANSETRON 4 MG PO TBDP: 4.0000 mg | ORAL_TABLET | Freq: Four times a day (QID) | ORAL | Status: DC | PRN

## 2020-03-27 MED ORDER — INSULIN ASPART 100 UNIT/ML ~~LOC~~ SOLN: 0.0000 [IU] | Freq: Every day | SUBCUTANEOUS | Status: DC

## 2020-03-27 MED ORDER — MOMETASONE FURO-FORMOTEROL FUM 100-5 MCG/ACT IN AERO
2.0000 | INHALATION_SPRAY | Freq: Two times a day (BID) | RESPIRATORY_TRACT | Status: DC
  Filled 2020-03-27: qty 8.8

## 2020-03-27 MED ORDER — CHLORHEXIDINE GLUCONATE CLOTH 2 % EX PADS: 6.0000 | MEDICATED_PAD | Freq: Every day | CUTANEOUS | Status: DC

## 2020-03-27 MED ORDER — LORAZEPAM 2 MG/ML IJ SOLN: 1.0000 mg | INTRAMUSCULAR | Status: DC | PRN

## 2020-03-27 MED ORDER — ACETAMINOPHEN 650 MG RE SUPP: 650.0000 mg | Freq: Four times a day (QID) | RECTAL | Status: DC | PRN

## 2020-03-27 MED ORDER — HEPARIN SODIUM (PORCINE) 5000 UNIT/ML IJ SOLN: 5000.0000 [IU] | Freq: Three times a day (TID) | INTRAMUSCULAR | Status: DC

## 2020-03-27 MED ORDER — MUPIROCIN 2 % EX OINT
1.0000 "application " | TOPICAL_OINTMENT | Freq: Two times a day (BID) | CUTANEOUS | Status: DC
  Administered 2020-03-27: 1 via NASAL
  Filled 2020-03-27: qty 22

## 2020-03-27 MED ORDER — BIOTENE DRY MOUTH MT LIQD: 15.0000 mL | OROMUCOSAL | Status: DC | PRN

## 2020-03-27 MED ORDER — ONDANSETRON HCL 4 MG/2ML IJ SOLN: 4.0000 mg | Freq: Four times a day (QID) | INTRAMUSCULAR | Status: DC | PRN

## 2020-03-27 MED ORDER — ACETAMINOPHEN 325 MG PO TABS: 650.0000 mg | ORAL_TABLET | Freq: Four times a day (QID) | ORAL | Status: DC | PRN

## 2020-03-27 MED ORDER — FENTANYL CITRATE (PF) 100 MCG/2ML IJ SOLN
25.0000 ug | INTRAMUSCULAR | Status: DC | PRN
  Administered 2020-03-27: 25 ug via INTRAVENOUS
  Filled 2020-03-27 (×2): qty 2

## 2020-03-27 MED ORDER — SODIUM CHLORIDE 0.45 % IV SOLN: INTRAVENOUS | Status: DC

## 2020-03-27 MED ORDER — SODIUM CHLORIDE 0.9 % IV SOLN: INTRAVENOUS | Status: DC

## 2020-03-27 MED ORDER — GLYCOPYRROLATE 0.2 MG/ML IJ SOLN: 0.2000 mg | INTRAMUSCULAR | Status: DC | PRN

## 2020-03-27 MED ORDER — POLYVINYL ALCOHOL 1.4 % OP SOLN
1.0000 [drp] | Freq: Four times a day (QID) | OPHTHALMIC | Status: DC | PRN
  Filled 2020-03-27: qty 15

## 2020-03-27 MED ORDER — INSULIN ASPART 100 UNIT/ML ~~LOC~~ SOLN
0.0000 [IU] | Freq: Three times a day (TID) | SUBCUTANEOUS | Status: DC
  Administered 2020-03-27: 09:00:00 1 [IU] via SUBCUTANEOUS

## 2020-03-27 MED ORDER — ALBUTEROL SULFATE (2.5 MG/3ML) 0.083% IN NEBU: 2.5000 mg | INHALATION_SOLUTION | Freq: Four times a day (QID) | RESPIRATORY_TRACT | Status: DC | PRN

## 2020-03-27 NOTE — Consult Note (Signed)
Consultation Note Date: 03/27/2020   Patient Name: Katherine [name]  DOB: 20-Aug-1928  MRN: [id number]  Age / Sex: 86 y.o., female  PCP: Celene Squibb, MD Referring Physician: Barb Merino, MD  Reason for Consultation: Establishing goals of care  HPI/Patient Profile:  Katherine [name] is a 86 y.o. female admitted 03/26/20 with acute metabolic encephalopathy, AKI secondary to dehydration, and hypovolemic hypernatremia in the setting of advanced dementia.  PMH significant for CAD status post CABG x4, chronic diastolic CHF, obesity (BMI 33.20), hypertension, hyperlipidemia, insulin-dependent type 2 diabetes, history of severe aortic stenosis status post TAVR, and hospital admission in March 2021 for GI bleed secondary to a large submucosal gastric mass with surface ulceration for which she was evaluated by general surgery not felt to be a candidate for aggressive interventions.  Palliative care was asked to consult for ongoing goals of care conversations in the setting of advanced dementia.   Clinical Assessment and Goals of Care: I have reviewed medical records including EPIC notes, labs and imaging, received report from bedside RN, assessed the patient.    I called patients daughters: Katherine [name], and Katherine [name] to further discuss diagnosis prognosis, GOC, EOL wishes, disposition and options.   I introduced Palliative Medicine as specialized medical care for people living with serious illness. It focuses on providing relief from the symptoms and stress of a serious illness. The goal is to improve quality of life for both the patient and the family.  I asked family to tell me about Katherine [name]. They share that she is from [place]. She was married for over 43 years prior to her husband dying from complications of heart failure. 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 They share three  daughters and a son, Katherine [name] together. She is described as extremely independent. Patient was a strong believer and is of the [employer].   Prior to hospitalization in March the patient was able to go to the restroom with her wheelchair and pivot with her walker to the commode. After that hospital stay per her daughters she went to a SNF [facility]) where her condition deteriorated further. Upon this admission she was noted to be very hypernatremic. We discussed dementia and the progressiveness of the various stages. We honed in on the reality that in later stage dementia patients no longer feel the need or desire to eat and drink. [name] family seemed to understand this.   Katherine [name] shared that her mother had been saying for greater than six months now that she knows her time is short. She woud often state that she was tired and ask, "why and I still here?". Katherine [name] states she feels that "her body has been giving out".   A detailed discussion was had today regarding advanced directives, the patients daughters make decisions collectively on her behalf.   Concepts specific to code status, artifical feeding and hydration, continued IV antibiotics and rehospitalization was had.  The difference between a aggressive medical intervention path  and a palliative  comfort care path for this patient at this time was had. We talked about transition to comfort measures in house and what that would entail inclusive of medications to control pain, dyspnea, agitation, nausea, itching, and hiccups.    Again, reviewed what that would look like in the hospital inclusive of stopping all uneccessary measures such as blood draws, needle sticks, and frequent vital signs.   Utilized reflective listening throughout our time together.   We discussed hospice and the goal of providing dignity and quality in the time we have left. Patients daughters all agreed that transition to a comfort focus with the plan for a residential  hospice aligns with [name] wishes for herself.   Decision Maker: Patients daughter, Katherine [name]: [phone]  SUMMARY OF RECOMMENDATIONS   DNAR/DNI  TOC --> Residential Hospice Home referral  Transition to comfort oriented care  Code Status/Advance Care Planning:  DNR  Symptom Management:  Dyspnea: Pain:  - Fentanyl 48mcg Q1H PRN  - Continue inhaler Fever:  - Tylenol 650mg  PO/PR Q6H PRN  - Heat Pack PRN Anxiety:  - Lorazepam 1mg   IV  Q1H PRN Nausea:  - Zofran 4mg  PO/IV Q6H PRN  Secretions:  - Glycopyrrolate 0.2mg  IV Q4H PRN Dry Eyes:  - Artificial Tears PRN Xerostomia:  - Biotene 86ml PRN  - BID oral care Urinary Retention:  - Maintain foley  Constipation:  - Bisacodyl 10mg  PR PRN QDay Spiritual:  - Chaplain consult  Palliative Prophylaxis:   Aspiration, Bowel Regimen, Delirium Protocol, Eye Care, Frequent Pain Assessment, Oral Care, Palliative Wound Care and Turn Reposition  Additional Recommendations (Limitations, Scope, Preferences):  Full Comfort Care  Psycho-social/Spiritual:   Desire for further Chaplaincy support: Yes  Additional Recommendations: Caregiving  Support/Resources  Prognosis:   < 6 months  Discharge Planning: Hospice facility     Primary Diagnoses: Present on Admission: **None** I have reviewed the medical record, interviewed the patient and family, and examined the patient. The following aspects are pertinent. Past Medical History:  Diagnosis Date  . Acute bronchitis 12/16/2015  . Acute exacerbation of CHF (congestive heart failure) ([place]) 11/20/2016  . Acute metabolic encephalopathy 0/93/8182  . Anemia   . Anxiety   . Aortic valvular stenosis    moderate to severe  EF on 2D Echo 10/22/2012 65-70%  valve area of 1.2cm, and peak and mean gradients of 45 and 59mmHg  . Bronchospasm 12/16/2015  . Chalazion of right upper eyelid early June 2016   treating with cream, Rx by Dr. Merlyn Albert    . CHF (congestive heart  failure) ([place])   . Congestive heart failure ([place]) 03/04/2010   Qualifier: Diagnosis of  By: Tamala Julian CMA, Claiborne Billings    . Coronary artery disease   . Dementia ([place])   . Diabetes mellitus without complication ([place]) 9/93/7169  . Diabetes mellitus, type 2 ([place])   . Dyspnea    chronic  . Easy fatigability   . Gastric mass   . Gastric ulcer   . GERD (gastroesophageal reflux disease)   . Heart murmur   . Hernia of unspecified site of abdominal cavity without mention of obstruction or gangrene    hiatal  . Hyperlipidemia   . Hypertension   . Hypokalemia 11/23/2016  . Normal cardiac stress test 12/2013   normal stress nuclear study  . Osteoarthritis   . Pain due to onychomycosis of toenails of both feet 06/04/2019  . Thyroid mass    Social History   Socioeconomic History  . Marital status: Widowed  Spouse name: Not on file  . Number of children: 5  . Years of education: Not on file  . Highest education level: Not on file  Occupational History  . Occupation: Health and safety inspector and CNA  Tobacco Use  . Smoking status: Never Smoker  . Smokeless tobacco: Never Used  Substance and Sexual Activity  . Alcohol use: No  . Drug use: No  . Sexual activity: Not on file  Other Topics Concern  . Not on file  Social History Narrative  . Not on file    Family History  Problem Relation Age of Onset  . Diabetes Mother   . Heart disease Mother   . Stroke Mother   . Cerebral aneurysm Mother   . Stroke Father   . Heart attack Brother   . Colon cancer Neg Hx    Scheduled Meds: . Chlorhexidine Gluconate Cloth  6 each Topical Q0600  . heparin  5,000 Units Subcutaneous Q8H  . insulin aspart  0-5 Units Subcutaneous QHS  . insulin aspart  0-9 Units Subcutaneous TID WC  . mometasone-formoterol  2 puff Inhalation BID  . mupirocin ointment  1 application Nasal BID   Continuous Infusions: . sodium chloride 125 mL/hr at 03/27/20 0923  . azithromycin Stopped (03/26/20 2025)  . cefTRIAXone  (ROCEPHIN)  IV Stopped (03/26/20 2025)   PRN Meds:.acetaminophen **OR** acetaminophen, albuterol  Allergies  Allergen Reactions  . Tape Other (See Comments)    SKIN IS VERY THIN AND WILL TEAR EASILY!!   Vital Signs: BP (!) 159/50 (BP Location: Left Arm)   Pulse 65   Temp 98.5 F (36.9 C) (Oral)   Resp 15   Ht 5' (1.524 m)   Wt 66 kg   SpO2 100%   BMI 28.42 kg/m  Pain Scale: 0-10   Pain Score: Asleep  SpO2: SpO2: 100 % O2 Device:SpO2: 100 % O2 Flow Rate: .   IO: Intake/output summary:   Intake/Output Summary (Last 24 hours) at 03/27/2020 1209 Last data filed at 03/27/2020 1245 Gross per 24 hour  Intake 3750 ml  Output 300 ml  Net 3450 ml   LBM: Last BM Date: 03/27/20 Baseline Weight: Weight: 77.1 kg Most recent weight: Weight: 66 kg     Palliative Assessment/Data: 20%  Time In: 1145 Time Out: 1300 Time Total: 75 minutes Greater than 50%  of this time was spent counseling and coordinating care related to the above assessment and plan.  Signed by: Rosezella Rumpf, NP Elie Confer, NP  Please contact Palliative Medicine Team phone at [phone] for questions and concerns.  For individual provider: See Shea Evans

## 2020-03-27 NOTE — Progress Notes (Signed)
The chaplain responded to a consult for spiritual care. There was no family present ant he patient is currently unresponsive. The chaplain is available for follow-up if family or the patient needs spiritual support.  Brion Aliment Chaplain Resident For questions concerning this note please contact me by pager [phone]

## 2020-03-27 NOTE — TOC Initial Note (Addendum)
Transition of Care (TOC) - Initial/Assessment Note  Katherine Gibbons RN,BSN Transitions of Care Unit 4NP (non trauma) - RN Case Manager [phone]   Patient Details  Name: Katherine [name] MRN: [id number] Date of Birth: 1928-03-17  Transition of Care [facility]) CM/SW Contact:    Katherine Patricia, RN Phone Number: 03/27/2020, 2:34 PM  Clinical Narrative:                 Pt admitted from [facility], Received notification from [facility] that goal now is for comfort care and plan per family is for Residential Hospice placement here in [place]. Call made to Katherine [name] with [facility] for [facility] referral. Pending review by Hospice and bed availability- pt will transition to Residential Hospice.  1600- update spoke with daughter Katherine [name] to confirm preferred location - discussed [facility], [facility], and next closest [facility]- per [place] their first choice remains [facility].    Expected Discharge Plan: [place]     Patient Goals and CMS Choice Patient states their goals for this hospitalization and ongoing recovery are:: comfort care CMS Medicare.gov Compare Post Acute Care list provided to:: Patient Represenative (must comment) Choice offered to / list presented to : Adult Children  Expected Discharge Plan and Services Expected Discharge Plan: [place] Choice: Hospice Living arrangements for the past 2 months: [place]                                      Prior Living Arrangements/Services Living arrangements for the past 2 months: Skilled Nursing Facility            Need for Family Participation in Patient Care: Yes (Comment) Care giver support system in place?: Yes (comment)   Criminal Activity/Legal Involvement Pertinent to Current Situation/Hospitalization: No - Comment as needed  Activities of Daily Living   ADL Screening (condition at time of admission) Patient's  cognitive ability adequate to safely complete daily activities?: No Is the patient deaf or have difficulty hearing?: No Does the patient have difficulty seeing, even when wearing glasses/contacts?: No Does the patient have difficulty concentrating, remembering, or making decisions?: Yes Patient able to express need for assistance with ADLs?: No Does the patient have difficulty dressing or bathing?: Yes Independently performs ADLs?: No Communication: Needs assistance Is this a change from baseline?: Pre-admission baseline Dressing (OT): Needs assistance Is this a change from baseline?: Pre-admission baseline Grooming: Needs assistance Is this a change from baseline?: Pre-admission baseline Feeding: Needs assistance Is this a change from baseline?: Pre-admission baseline Bathing: Needs assistance Is this a change from baseline?: Pre-admission baseline Toileting: Needs assistance Is this a change from baseline?: Pre-admission baseline In/Out Bed: Needs assistance Is this a change from baseline?: Pre-admission baseline Walks in Home: Needs assistance Is this a change from baseline?: Pre-admission baseline Does the patient have difficulty walking or climbing stairs?: Yes Weakness of Legs: Both Weakness of Arms/Hands: None  Permission Sought/Granted      Share Information with NAME: Katherine [name]     Permission granted to share info w Relationship: daughter  Permission granted to share info w Contact Information: [phone]  Emotional Assessment       Orientation: : Oriented to Self      Admission diagnosis:  Hyperchloremia [E87.8] Hypernatremia [E87.0] Hyperglycemia [R73.9] AKI (acute kidney injury) ([place]) [N17.9] Sepsis ([place]) [A41.9] Altered mental status, unspecified  altered mental status type [R41.82] Sepsis, due to unspecified organism, unspecified whether acute organ dysfunction present [facility]) [A41.9] Patient Active Problem List   Diagnosis Date Noted  . Severe  sepsis ([place]) 03/27/2020  . AKI (acute kidney injury) ([place]) 03/27/2020  . Hypernatremia 03/27/2020  . Encounter for end of life care 03/27/2020  . Palliative care by specialist   . Goals of care, counseling/discussion   . Esophageal ring   . Heme positive stool   . GI bleed 02/22/2020  . Advanced dementia ([place]) 02/22/2020  . Coagulation disorder ([place]) 06/04/2019  . Acute metabolic encephalopathy 25/75/0518  . Urinary tract infection 06/26/2018  . Chronic diastolic CHF (congestive heart failure) ([place]) 06/26/2018  . Cough 11/23/2016  . Chronic diastolic heart failure ([place])   . S/p TAVR (transcatheter aortic valve replacement), bioprosthetic 09/02/2015  . Constipation 06/04/2015  . Postoperative pulmonary edema ([place]) 06/04/2015  . Severe aortic stenosis   . Severe aortic valve stenosis 05/26/2015  . Bilateral leg edema 10/17/2014  . CHF with left ventricular diastolic dysfunction, NYHA class 2 ([place]) 09/09/2014  . Difficulty walking 10/28/2013  . Muscle weakness (generalized) 10/28/2013  . Morbid obesity ([place]) 10/24/2013  . Moderate to severe aortic stenosis 04/22/2013  . S/P CABG x 4 04/22/2013  . Gastric mass 08/18/2011  . Diabetes mellitus, type 2 ([place])   . COLONIC POLYPS, HX OF 03/04/2010  . THYROID MASS 02/25/2010  . Diabetes mellitus type 2, uncontrolled ([place]) 02/25/2010  . Hyperlipidemia 02/25/2010  . UNSPECIFIED ANEMIA 02/25/2010  . Anxiety state 02/25/2010  . Essential hypertension 02/25/2010  . Coronary atherosclerosis 02/25/2010  . REFLUX ESOPHAGITIS 02/25/2010  . HIATAL HERNIA 02/25/2010  . OSTEOARTHRITIS 02/25/2010  . History of peptic ulcer disease 02/25/2010   PCP:  Katherine Squibb, MD Pharmacy:   [facility] [address] [place] [address] Phone: [phone] Fax: [phone]     Social Determinants of Health (SDOH) Interventions    Readmission Risk  Interventions No flowsheet data found.

## 2020-03-27 NOTE — Progress Notes (Signed)
Message sent to Dr Sloan Leiter with critical Na 160 . Dr promptly returned call with change in IVF orders noted.

## 2020-03-27 NOTE — Progress Notes (Signed)
Received patient form the ED confused with very little vertebral response.  However, she would open her eyes on command.  Patient found to have a large bowel movement on transfer from the ED greenish in color.  Patient has multiple pressure wounds on her sacral area that we cleaned and applied appropriate dressing to. The area around the Stage III pressure wounds are red!

## 2020-03-27 NOTE — Progress Notes (Signed)
Notified bedside nurse of need to draw repeat lactic acid. 

## 2020-03-27 NOTE — Progress Notes (Addendum)
Manufacturing engineer [facility])  Referral received for residential hospice at [facility].  Ms. Gingrich is appropriate for residential hospice placement.   Visited pt at the bedside, no family present.  Contacted daughter [name], she states that she cannot make a decision and was told by her other sister to see "what all the options were for hospice".  Liaison informed that for residential there is a facility in [place].  [name] requested that we speak with [name].  [name] did not answer and no option to leave VM.  At this time, family unable to decide.    ACC will follow up with [facility] manager and reach out to family Saturday am to see if they have made a decision.    Venia Carbon RN, BSN, [facility] Liaison   **Kristi [facility] manager spoke with [place], she would like to proceed with [facility] placement.  We will have a bed to offer on Saturday am.  [facility] did not have an open bed today, explained to family and hospital team.  We anticipate a bed Sunday morning and will follow up with hospital staff as soon as a decision has been made from [facility].

## 2020-03-27 NOTE — Progress Notes (Signed)
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 In the emergency room, patient is hypotensive with blood pressure as low as 70/30. Barely responsive. Sodium 156. Creatinine 2.9. COVID-19 negative. Chest x-ray essentially normal. Head CT with no acute changes. She was admitted to the hospital and treated for sepsis, hypernatremia due to free water deficit, altered mental status. 4/23: Family meeting was held by palliative care team and decided to change to comfort care and pursue residential hospice.   Assessment & Plan:   Principal Problem:   Severe sepsis ([place]) Active Problems:   Acute metabolic encephalopathy   AKI (acute kidney injury) ([place])   Hypernatremia   Encounter for end of life care  Hypovolemic hypernatremia: Due to free water deficit. Patient with advanced dementia with terminal illness, no oral intake and unable to maintain free water intake. Hospice appropriate. Acute metabolic encephalopathy in the setting of underlying advanced dementia Acute kidney injury Hypertension Advanced physical debility   Plan: As per palliative care discussion with family, comfort care measures. Discontinue all invasive testings, blood draws. All symptom control medications available. Social worker to explore inpatient hospice options. We will continue to provide end-of-life  care while she is in the hospital under our care.   DVT prophylaxis: Comfort care Code Status: Comfort care Family Communication: Call placed to patient's daughter, unable to talk. Palliative care team communicated that they had conversation with all family members. Disposition Plan: Status is: Inpatient  Remains inpatient appropriate because:Persistent severe electrolyte disturbances and Appropriate for hospice level of care. Waiting for bed availability.   Dispo: The patient is from: SNF              Anticipated d/c is to: Hospice home              Anticipated d/c date is: 1 day              Patient currently is medically stable to d/c. only to hospice level of care.          Consultants:   Palliative medicine  Procedures:   None  Antimicrobials:   Stop all antibiotics.   Subjective: Patient seen and examined in the morning rounds. She was totally unresponsive. Moans on deep stimulation. Remains unsafe to eat or drink. Afebrile. Blood pressure responded.  Objective: Vitals:   03/27/20 0125 03/27/20 0347 03/27/20 0802 03/27/20 1157  BP: (!) 112/45 138/69 (!) 180/57 (!) 159/50  Pulse: 74 70 62 65  Resp: 14 18 14 15   Temp: 98.7 F (37.1 C) 98.3 F (36.8 C) 98 F (36.7 C) 98.5 F (36.9 C)  TempSrc: Oral Axillary Axillary Oral  SpO2: 99% 98% 97% 100%  Weight: 66 kg     Height:        Intake/Output Summary (Last 24 hours) at 03/27/2020 1304 Last data filed at 03/27/2020 0923 Gross per 24 hour  Intake 3750 ml  Output 300 ml  Net 3450 ml   Filed Weights   03/26/20 1824  03/27/20 0125  Weight: 77.1 kg 66 kg    Examination:  General exam: Patient appears comfortable. Not in any distress. Anxious on deep stimulation. Respiratory system: Clear to auscultation. Respiratory effort normal. No added sound. Cardiovascular system: S1 & S2 heard, RRR. Gastrointestinal system: Abdomen is nondistended, soft and nontender. Central nervous system: Sleepy and  lethargic. Unable to keep up conversation. Not following commands. Data Reviewed: I have personally reviewed following labs and imaging studies  CBC: Recent Labs  Lab 03/26/20 1833 03/27/20 0826  WBC 11.4* 14.7*  NEUTROABS 8.9*  --   HGB 12.9 10.3*  HCT 40.6 32.1*  MCV 89.4 88.4  PLT 385 833   Basic Metabolic Panel: Recent Labs  Lab 03/26/20 1833 03/27/20 0146 03/27/20 0615 03/27/20 0826  NA 154* 160* 161* 160*  K 4.7 4.0 3.9 3.8  CL 117* 124* 126* 127*  CO2 21* 22 22 24   GLUCOSE 246* 185* 164* 163*  BUN 70* 71* 70* 68*  CREATININE 2.90* 2.55* 2.42* 2.26*  CALCIUM 11.3* 10.3 10.4* 10.1   GFR: Estimated Creatinine Clearance: 14 mL/min (A) (by C-G formula based on SCr of 2.26 mg/dL (H)). Liver Function Tests: Recent Labs  Lab 03/26/20 1833  AST 37  ALT 32  ALKPHOS 109  BILITOT 0.6  PROT 7.5  ALBUMIN 3.8   No results for input(s): LIPASE, AMYLASE in the last 168 hours. No results for input(s): AMMONIA in the last 168 hours. Coagulation Profile: Recent Labs  Lab 03/26/20 2232  INR 1.4*   Cardiac Enzymes: No results for input(s): CKTOTAL, CKMB, CKMBINDEX, TROPONINI in the last 168 hours. BNP (last 3 results) No results for input(s): PROBNP in the last 8760 hours. HbA1C: No results for input(s): HGBA1C in the last 72 hours. CBG: Recent Labs  Lab 03/26/20 1746 03/27/20 0800 03/27/20 1152  GLUCAP 215* 136* 148*   Lipid Profile: No results for input(s): CHOL, HDL, LDLCALC, TRIG, CHOLHDL, LDLDIRECT in the last 72 hours. Thyroid Function Tests: No results for input(s): TSH, T4TOTAL, FREET4, T3FREE, THYROIDAB in the last 72 hours. Anemia Panel: No results for input(s): VITAMINB12, FOLATE, FERRITIN, TIBC, IRON, RETICCTPCT in the last 72 hours. Sepsis Labs: Recent Labs  Lab 03/26/20 1822 03/26/20 2224 03/27/20 0146  PROCALCITON  --   --  0.12  LATICACIDVEN 3.1* 3.8* 2.6*    Recent Results (from the past 240 hour(s))  Respiratory Panel by RT PCR  (Flu A&B, Covid) - Nasopharyngeal Swab     Status: None   Collection Time: 03/26/20  8:30 PM   Specimen: Nasopharyngeal Swab  Result Value Ref Range Status   SARS Coronavirus 2 by RT PCR NEGATIVE NEGATIVE Final    Comment: (NOTE) SARS-CoV-2 target nucleic acids are NOT DETECTED. The SARS-CoV-2 RNA is generally detectable in upper respiratoy specimens during the acute phase of infection. The lowest concentration of SARS-CoV-2 viral copies this assay can detect is 131 copies/mL. A negative result does not preclude SARS-Cov-2 infection and should not be used as the sole basis for treatment or other patient management decisions. A negative result may occur with  improper specimen collection/handling, submission of specimen other than nasopharyngeal swab, presence of viral mutation(s) within the areas targeted by this assay, and inadequate number of viral copies (<131 copies/mL). A negative result must be combined with clinical observations, patient history, and epidemiological information. The expected result is Negative. Fact Sheet for Patients:  PinkCheek.be Fact Sheet for Healthcare Providers:  GravelBags.it This test is not yet ap proved or cleared by the  [place] Architectural technologist and  has been authorized for detection and/or diagnosis of SARS-CoV-2 by FDA under an Print production planner (EUA). This EUA will remain  in effect (meaning this test can be used) for the duration of the COVID-19 declaration under Section 564(b)(1) of the Act, 21 U.S.C. section 360bbb-3(b)(1), unless the authorization is terminated or revoked sooner.    Influenza A by PCR NEGATIVE NEGATIVE Final   Influenza B by PCR NEGATIVE NEGATIVE Final    Comment: (NOTE) The Xpert Xpress SARS-CoV-2/FLU/RSV assay is intended as an aid in  the diagnosis of influenza from Nasopharyngeal swab specimens and  should not be used as a sole basis for treatment. Nasal  washings and  aspirates are unacceptable for Xpert Xpress SARS-CoV-2/FLU/RSV  testing. Fact Sheet for Patients: PinkCheek.be Fact Sheet for Healthcare Providers: GravelBags.it This test is not yet approved or cleared by the [place] FDA and  has been authorized for detection and/or diagnosis of SARS-CoV-2 by  FDA under an Emergency Use Authorization (EUA). This EUA will remain  in effect (meaning this test can be used) for the duration of the  Covid-19 declaration under Section 564(b)(1) of the Act, 21  U.S.C. section 360bbb-3(b)(1), unless the authorization is  terminated or revoked. Performed at [facility], [place] [address], [place], [place] [address]   MRSA PCR Screening     Status: Abnormal   Collection Time: 03/27/20  1:40 AM   Specimen: Nasal Mucosa; Nasopharyngeal  Result Value Ref Range Status   MRSA by PCR POSITIVE (A) NEGATIVE Final    Comment:        The GeneXpert MRSA Assay (FDA approved for NASAL specimens only), is one component of a comprehensive MRSA colonization surveillance program. It is not intended to diagnose MRSA infection nor to guide or monitor treatment for MRSA infections. RESULT CALLED TO, READ BACK BY AND VERIFIED WITH: Thomes Lolling 0315 03/27/2020 Mena Goes Performed at [facility], [place] [address] [place], [place] [address]          Radiology Studies: CT Head Wo Contrast  Result Date: 03/26/2020 CLINICAL DATA:  Altered mental status (AMS), unclear cause. Additional history provided: Patient presents via ambulance 4 lethargy. EXAM: CT HEAD WITHOUT CONTRAST TECHNIQUE: Contiguous axial images were obtained from the base of the skull through the vertex without intravenous contrast. COMPARISON:  Head CT 06/26/2018 FINDINGS: Brain: There is no evidence of acute intracranial hemorrhage, intracranial mass, midline shift or extra-axial fluid collection.No demarcated cortical  infarction. Moderate patchy and confluent hypoattenuation within the cerebral white matter is nonspecific, but consistent with chronic small vessel ischemic disease. Findings are similar to prior head CT 06/26/2018. Stable, moderate generalized parenchymal atrophy. Vascular: No hyperdense vessel. Atherosclerotic calcifications Skull: Normal. Negative for fracture or focal lesion. Sinuses/Orbits: Visualized orbits demonstrate no acute abnormality. Mild ethmoid sinus mucosal thickening. Frothy secretions within the right sphenoid sinus. No significant mastoid effusion. IMPRESSION: 1. No evidence of acute intracranial abnormality. 2. Stable moderate generalized parenchymal atrophy and chronic small vessel ischemic disease. 3. Ethmoid and right sphenoid sinusitis. Electronically Signed   By: Kellie Simmering DO   On: 03/26/2020 22:19   US RENAL  Result Date: 03/27/2020 CLINICAL DATA:  Acute renal failure. EXAM: RENAL / URINARY TRACT ULTRASOUND COMPLETE COMPARISON:  CT 04/10/2015.  Renal ultrasound 01/06/2015. FINDINGS: Right Kidney: Renal measurements: 12.1 x 4.5 x 4.9 cm = volume: 139.7 mL . Echogenicity within normal limits. 3.7 x 2.2 x 2.5 cm solid mass again noted in  the right kidney. Although this may represent a renal cell carcinoma. This appears stable from prior CT of 04/10/2015 and prior renal ultrasound of 01/06/2015. 3.3 x 3.4 x 3.4 cm simple cyst again noted. No hydronephrosis visualized. Left Kidney: Renal measurements: 10.5 x 5.1 x 3.8 cm = volume: 105.9 mL. Echogenicity within normal limits. 1.7 x 1.5 x 1.2 cm simple cyst. 1.9 x 1.9 x 2.1 cm simple cyst. Similar findings noted on prior exams. No hydronephrosis visualized. Bladder: Not visualized. Other: None. IMPRESSION: 1. 3.7 x 2.2 x 2.5 cm right renal solid mass again noted. Although this may represent a renal cell carcinoma, this appears stable from prior CT of 04/10/2015 and prior renal ultrasound of 01/06/2015. 2.  Simple bilateral renal cysts are  again noted. 3. No acute abnormality. No hydronephrosis. Bladder not visualized. Electronically Signed   By: Marcello Moores  Register   On: 03/27/2020 05:43   DG Chest Port 1 View  Result Date: 03/26/2020 CLINICAL DATA:  Shortness of breath and lethargy. EXAM: PORTABLE CHEST 1 VIEW COMPARISON:  Radiograph 06/26/2018 FINDINGS: Post median sternotomy and TAVR. Cardiomegaly is similar to prior exam allowing for differences in technique. Aortic tortuosity and atherosclerosis. Overall low lung volumes. Streaky bibasilar atelectasis or scarring. No pulmonary edema, confluent airspace disease, large pleural effusion or pneumothorax. Calcified granuloma at the right lung apex. IMPRESSION: 1. Low lung volumes with streaky bibasilar atelectasis or scarring. 2. Stable cardiomegaly. Electronically Signed   By: Keith Rake M.D.   On: 03/26/2020 19:15        Scheduled Meds:  mometasone-formoterol  2 puff Inhalation BID   Continuous Infusions:    LOS: 1 day    Time spent: 35 minutes    Barb Merino, MD [facility] Pager [phone]

## 2020-03-28 DIAGNOSIS — R6520 Severe sepsis without septic shock: Secondary | ICD-10-CM | POA: Diagnosis not present

## 2020-03-28 DIAGNOSIS — A419 Sepsis, unspecified organism: Secondary | ICD-10-CM | POA: Diagnosis not present

## 2020-03-28 DIAGNOSIS — L8990 Pressure ulcer of unspecified site, unspecified stage: Secondary | ICD-10-CM | POA: Insufficient documentation

## 2020-03-28 LAB — CALCIUM, IONIZED: Calcium, Ionized, Serum: 6 mg/dL — ABNORMAL HIGH (ref 4.5–5.6)

## 2020-03-28 NOTE — Progress Notes (Signed)
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 In the emergency room, patient is hypotensive with blood pressure as low as 70/30. Barely responsive. Sodium 156. Creatinine 2.9. COVID-19 negative. Chest x-ray essentially normal. Head CT with no acute changes. She was admitted to the hospital and treated for sepsis, hypernatremia due to free water deficit, altered mental status.  4/23: Family meeting was held by palliative care team and decided to change to comfort care and pursue residential hospice. Waiting for inpatient bed.   Assessment & Plan:   Principal Problem:   Severe sepsis ([place]) Active Problems:   Acute metabolic encephalopathy   AKI (acute kidney injury) ([place])   Hypernatremia   Encounter for end of life care   DNR (do not resuscitate)   Terminal care   Pressure injury of skin  Hypovolemic hypernatremia: Due to free water deficit. Patient with advanced dementia with terminal illness, no oral intake and unable to maintain free water intake. Hospice appropriate. Acute metabolic encephalopathy in the setting of underlying advanced dementia Acute kidney injury Hypertension Advanced physical debility   Plan: As per palliative care discussion with family, comfort care measures. Discontinued all invasive testings, blood draws. All symptom control medications  available. Social worker to explore inpatient hospice options. We will continue to provide end-of-life care while she is in the hospital under our care.   DVT prophylaxis: Comfort care Code Status: Comfort care Family Communication: Called and updated patient's daughter Ms. [employer].  Everybody is on board to transfer to [facility].  Probably will have bed level tomorrow. Disposition Plan: Status is: Inpatient  Remains inpatient appropriate because:Persistent severe electrolyte disturbances and Appropriate for hospice level of care. Waiting for bed availability.   Dispo: The patient is from: SNF              Anticipated d/c is to: Hospice home              Anticipated d/c date is: 1 day              Patient currently is medically stable to d/c. only to hospice level of care.          Consultants:   Palliative medicine  Procedures:   None  Antimicrobials:   Stopped all antibiotics.   Subjective: Patient seen and examined.  No overnight events.  Remains confused.  Unable to respond meaningful.  She was just pulling on her gown and mumbled some on stimulation.  Objective: Vitals:   03/27/20 1157 03/28/20 0432 03/28/20 0912 03/28/20 1234  BP: (!) 159/50 128/87 128/69 (!) 188/61  Pulse: 65 62 64 63  Resp: 15  20 20   Temp: 98.5 F (36.9 C) 98.2 F (36.8 C) 98.3 F (36.8 C) 98.3 F (36.8 C)  TempSrc: Oral Oral    SpO2: 100% 100% 99% 100%  Weight:      Height:        Intake/Output Summary (Last 24 hours) at 03/28/2020 1318 Last data filed  at 03/28/2020 0915 Gross per 24 hour  Intake --  Output 900 ml  Net -900 ml   Filed Weights   03/26/20 1824 03/27/20 0125  Weight: 77.1 kg 66 kg    Examination:  General exam: Patient appears comfortable. Not in any distress. Anxious on deep stimulation. Respiratory system: Clear to auscultation. Respiratory effort normal. No added sound. Cardiovascular system: S1 & S2 heard, RRR. Gastrointestinal system:  Abdomen is nondistended, soft and nontender. Central nervous system: Sleepy and lethargic. Unable to keep up conversation. Not following commands.  Data Reviewed: I have personally reviewed following labs and imaging studies  CBC: Recent Labs  Lab 03/26/20 1833 03/27/20 0826  WBC 11.4* 14.7*  NEUTROABS 8.9*  --   HGB 12.9 10.3*  HCT 40.6 32.1*  MCV 89.4 88.4  PLT 385 250   Basic Metabolic Panel: Recent Labs  Lab 03/26/20 1833 03/27/20 0146 03/27/20 0615 03/27/20 0826  NA 154* 160* 161* 160*  K 4.7 4.0 3.9 3.8  CL 117* 124* 126* 127*  CO2 21* 22 22 24   GLUCOSE 246* 185* 164* 163*  BUN 70* 71* 70* 68*  CREATININE 2.90* 2.55* 2.42* 2.26*  CALCIUM 11.3* 10.3 10.4* 10.1   GFR: Estimated Creatinine Clearance: 14 mL/min (A) (by C-G formula based on SCr of 2.26 mg/dL (H)). Liver Function Tests: Recent Labs  Lab 03/26/20 1833  AST 37  ALT 32  ALKPHOS 109  BILITOT 0.6  PROT 7.5  ALBUMIN 3.8   No results for input(s): LIPASE, AMYLASE in the last 168 hours. No results for input(s): AMMONIA in the last 168 hours. Coagulation Profile: Recent Labs  Lab 03/26/20 2232  INR 1.4*   Cardiac Enzymes: No results for input(s): CKTOTAL, CKMB, CKMBINDEX, TROPONINI in the last 168 hours. BNP (last 3 results) No results for input(s): PROBNP in the last 8760 hours. HbA1C: No results for input(s): HGBA1C in the last 72 hours. CBG: Recent Labs  Lab 03/26/20 1746 03/27/20 0800 03/27/20 1152  GLUCAP 215* 136* 148*   Lipid Profile: No results for input(s): CHOL, HDL, LDLCALC, TRIG, CHOLHDL, LDLDIRECT in the last 72 hours. Thyroid Function Tests: No results for input(s): TSH, T4TOTAL, FREET4, T3FREE, THYROIDAB in the last 72 hours. Anemia Panel: No results for input(s): VITAMINB12, FOLATE, FERRITIN, TIBC, IRON, RETICCTPCT in the last 72 hours. Sepsis Labs: Recent Labs  Lab 03/26/20 1822 03/26/20 2224 03/27/20 0146  PROCALCITON  --   --  0.12  LATICACIDVEN 3.1* 3.8* 2.6*     Recent Results (from the past 240 hour(s))  Blood Culture (routine x 2)     Status: None (Preliminary result)   Collection Time: 03/26/20  6:20 PM   Specimen: BLOOD  Result Value Ref Range Status   Specimen Description BLOOD RIGHT ANTECUBITAL  Final   Special Requests   Final    BOTTLES DRAWN AEROBIC AND ANAEROBIC Blood Culture adequate volume   Culture   Final    NO GROWTH < 24 HOURS Performed at [facility], [place] [address] [place], [place] [address]    Report Status PENDING  Incomplete  Blood Culture (routine x 2)     Status: None (Preliminary result)   Collection Time: 03/26/20  6:30 PM   Specimen: BLOOD RIGHT ARM  Result Value Ref Range Status   Specimen Description BLOOD RIGHT ARM  Final   Special Requests   Final    BOTTLES DRAWN AEROBIC ONLY Blood Culture results may not be optimal due to an inadequate volume of blood  received in culture bottles   Culture   Final    NO GROWTH < 24 HOURS Performed at [facility], [place] [address] [place], [place] [address]    Report Status PENDING  Incomplete  Respiratory Panel by RT PCR (Flu A&B, Covid) - Nasopharyngeal Swab     Status: None   Collection Time: 03/26/20  8:30 PM   Specimen: Nasopharyngeal Swab  Result Value Ref Range Status   SARS Coronavirus 2 by RT PCR NEGATIVE NEGATIVE Final    Comment: (NOTE) SARS-CoV-2 target nucleic acids are NOT DETECTED. The SARS-CoV-2 RNA is generally detectable in upper respiratoy specimens during the acute phase of infection. The lowest concentration of SARS-CoV-2 viral copies this assay can detect is 131 copies/mL. A negative result does not preclude SARS-Cov-2 infection and should not be used as the sole basis for treatment or other patient management decisions. A negative result may occur with  improper specimen collection/handling, submission of specimen other than nasopharyngeal swab, presence of viral mutation(s) within the areas targeted by this assay, and inadequate  number of viral copies (<131 copies/mL). A negative result must be combined with clinical observations, patient history, and epidemiological information. The expected result is Negative. Fact Sheet for Patients:  PinkCheek.be Fact Sheet for Healthcare Providers:  GravelBags.it This test is not yet ap proved or cleared by the [place] FDA and  has been authorized for detection and/or diagnosis of SARS-CoV-2 by FDA under an Emergency Use Authorization (EUA). This EUA will remain  in effect (meaning this test can be used) for the duration of the COVID-19 declaration under Section 564(b)(1) of the Act, 21 U.S.C. section 360bbb-3(b)(1), unless the authorization is terminated or revoked sooner.    Influenza A by PCR NEGATIVE NEGATIVE Final   Influenza B by PCR NEGATIVE NEGATIVE Final    Comment: (NOTE) The Xpert Xpress SARS-CoV-2/FLU/RSV assay is intended as an aid in  the diagnosis of influenza from Nasopharyngeal swab specimens and  should not be used as a sole basis for treatment. Nasal washings and  aspirates are unacceptable for Xpert Xpress SARS-CoV-2/FLU/RSV  testing. Fact Sheet for Patients: PinkCheek.be Fact Sheet for Healthcare Providers: GravelBags.it This test is not yet approved or cleared by the [place] FDA and  has been authorized for detection and/or diagnosis of SARS-CoV-2 by  FDA under an Emergency Use Authorization (EUA). This EUA will remain  in effect (meaning this test can be used) for the duration of the  Covid-19 declaration under Section 564(b)(1) of the Act, 21  U.S.C. section 360bbb-3(b)(1), unless the authorization is  terminated or revoked. Performed at [facility], [place] [address], [place], [place] [address]   Urine culture     Status: Abnormal (Preliminary result)   Collection Time: 03/26/20  9:17 PM   Specimen: In/Out Cath  Urine  Result Value Ref Range Status   Specimen Description IN/OUT CATH URINE  Final   Special Requests NONE  Final   Culture (A)  Final    >=100,000 COLONIES/mL GRAM NEGATIVE RODS CULTURE REINCUBATED FOR BETTER GROWTH SUSCEPTIBILITIES TO FOLLOW Performed at [facility], [place] [address] [place],  [address]    Report Status PENDING  Incomplete  MRSA PCR Screening     Status: Abnormal   Collection Time: 03/27/20  1:40 AM   Specimen: Nasal Mucosa; Nasopharyngeal  Result Value Ref Range Status   MRSA by PCR POSITIVE (A) NEGATIVE Final    Comment:  The GeneXpert MRSA Assay (FDA approved for NASAL specimens only), is one component of a comprehensive MRSA colonization surveillance program. It is not intended to diagnose MRSA infection nor to guide or monitor treatment for MRSA infections. RESULT CALLED TO, READ BACK BY AND VERIFIED WITH: Thomes Lolling 0315 03/27/2020 Mena Goes Performed at [facility], [place] [address] [place], [place] [address]          Radiology Studies: CT Head Wo Contrast  Result Date: 03/26/2020 CLINICAL DATA:  Altered mental status (AMS), unclear cause. Additional history provided: Patient presents via ambulance 4 lethargy. EXAM: CT HEAD WITHOUT CONTRAST TECHNIQUE: Contiguous axial images were obtained from the base of the skull through the vertex without intravenous contrast. COMPARISON:  Head CT 06/26/2018 FINDINGS: Brain: There is no evidence of acute intracranial hemorrhage, intracranial mass, midline shift or extra-axial fluid collection.No demarcated cortical infarction. Moderate patchy and confluent hypoattenuation within the cerebral white matter is nonspecific, but consistent with chronic small vessel ischemic disease. Findings are similar to prior head CT 06/26/2018. Stable, moderate generalized parenchymal atrophy. Vascular: No hyperdense vessel. Atherosclerotic calcifications Skull: Normal. Negative for fracture or focal  lesion. Sinuses/Orbits: Visualized orbits demonstrate no acute abnormality. Mild ethmoid sinus mucosal thickening. Frothy secretions within the right sphenoid sinus. No significant mastoid effusion. IMPRESSION: 1. No evidence of acute intracranial abnormality. 2. Stable moderate generalized parenchymal atrophy and chronic small vessel ischemic disease. 3. Ethmoid and right sphenoid sinusitis. Electronically Signed   By: Kellie Simmering DO   On: 03/26/2020 22:19   US RENAL  Result Date: 03/27/2020 CLINICAL DATA:  Acute renal failure. EXAM: RENAL / URINARY TRACT ULTRASOUND COMPLETE COMPARISON:  CT 04/10/2015.  Renal ultrasound 01/06/2015. FINDINGS: Right Kidney: Renal measurements: 12.1 x 4.5 x 4.9 cm = volume: 139.7 mL . Echogenicity within normal limits. 3.7 x 2.2 x 2.5 cm solid mass again noted in the right kidney. Although this may represent a renal cell carcinoma. This appears stable from prior CT of 04/10/2015 and prior renal ultrasound of 01/06/2015. 3.3 x 3.4 x 3.4 cm simple cyst again noted. No hydronephrosis visualized. Left Kidney: Renal measurements: 10.5 x 5.1 x 3.8 cm = volume: 105.9 mL. Echogenicity within normal limits. 1.7 x 1.5 x 1.2 cm simple cyst. 1.9 x 1.9 x 2.1 cm simple cyst. Similar findings noted on prior exams. No hydronephrosis visualized. Bladder: Not visualized. Other: None. IMPRESSION: 1. 3.7 x 2.2 x 2.5 cm right renal solid mass again noted. Although this may represent a renal cell carcinoma, this appears stable from prior CT of 04/10/2015 and prior renal ultrasound of 01/06/2015. 2.  Simple bilateral renal cysts are again noted. 3. No acute abnormality. No hydronephrosis. Bladder not visualized. Electronically Signed   By: Marcello Moores  Register   On: 03/27/2020 05:43   DG Chest Port 1 View  Result Date: 03/26/2020 CLINICAL DATA:  Shortness of breath and lethargy. EXAM: PORTABLE CHEST 1 VIEW COMPARISON:  Radiograph 06/26/2018 FINDINGS: Post median sternotomy and TAVR. Cardiomegaly is  similar to prior exam allowing for differences in technique. Aortic tortuosity and atherosclerosis. Overall low lung volumes. Streaky bibasilar atelectasis or scarring. No pulmonary edema, confluent airspace disease, large pleural effusion or pneumothorax. Calcified granuloma at the right lung apex. IMPRESSION: 1. Low lung volumes with streaky bibasilar atelectasis or scarring. 2. Stable cardiomegaly. Electronically Signed   By: Keith Rake M.D.   On: 03/26/2020 19:15        Scheduled Meds:  mometasone-formoterol  2 puff Inhalation BID   Continuous Infusions:  LOS: 2 days    Time spent: 25 minutes    Barb Merino, MD [facility] Pager [phone]

## 2020-03-28 NOTE — Progress Notes (Signed)
Palliative Medicine Inpatient Follow Up Note   HPI: Katherine [name] a 86 y.o.femaleadmitted 03/26/20 with acute metabolic encephalopathy, AKI secondary to dehydration, and hypovolemic hypernatremia in the setting of advanced dementia.  PMH significant for CAD status post CABG x4, chronic diastolic CHF, obesity (BMI 33.20), hypertension, hyperlipidemia, insulin-dependent type 2 diabetes, history of severe aortic stenosis status post TAVR, and hospital admission in March 2021 for GI bleed secondary to a large submucosal gastric mass with surface ulceration for which she was evaluated by general surgery not felt to be a candidate for aggressive interventions.  Palliative care was asked to consult for ongoing goals of care conversations in the setting of advanced dementia.   Today's Discussion (03/28/2020): Chart reviewed. Patient appears confused this morning noted to be pulling at gown. Covered up. Placed music on to calm her. PRN medication utilization.  Discussed the importance of continued conversation with family and their  medical providers regarding overall plan of care and treatment options, ensuring decisions are within the context of the patients values and GOCs.  Questions and concerns addressed   Vital Signs Vitals:   03/28/20 0432 03/28/20 0912  BP: 128/87 128/69  Pulse: 62 64  Resp:  20  Temp: 98.2 F (36.8 C) 98.3 F (36.8 C)  SpO2: 100% 99%    Intake/Output Summary (Last 24 hours) at 03/28/2020 1039 Last data filed at 03/28/2020 0915 Gross per 24 hour  Intake --  Output 1200 ml  Net -1200 ml   Last Weight  Most recent update: 03/27/2020  1:28 AM   Weight  66 kg (145 lb 8.1 oz)           SUMMARY OF RECOMMENDATIONS   DNAR/DNI  Pending [facility] Bed acceptance  Comfort oriented care  Code Status/Advance Care Planning:  DNR  Symptom Management:  Dyspnea: Pain:                 - Fentanyl 45mcg Q1H PRN                 - Continue  inhaler Fever:                 - Tylenol 650mg  PO/PR Q6H PRN                 - Heat Pack PRN Anxiety:                 - Lorazepam 1mg   IV  Q1H PRN Nausea:                 - Zofran 4mg  PO/IV Q6H PRN             Secretions:                 - Glycopyrrolate 0.2mg  IV Q4H PRN Dry Eyes:                 - Artificial Tears PRN Xerostomia:                 - Biotene 69ml PRN                 - BID oral care Urinary Retention:                 - Maintain foley  Constipation:                 - Bisacodyl 10mg  PR PRN QDay Spiritual:                 -  Chaplain consult  Time Spent: 15 Greater than 50% of the time was spent in counseling and coordination of care ______________________________________________________________________________________ [place] Team Team Cell Phone: [phone] Please utilize secure chat with additional questions, if there is no response within 30 minutes please call the above phone number  Palliative Medicine Team providers are available by phone from 7am to 7pm daily and can be reached through the team cell phone.  Should this patient require assistance outside of these hours, please call the patient's attending physician.

## 2020-03-29 DIAGNOSIS — Z7189 Other specified counseling: Secondary | ICD-10-CM | POA: Diagnosis not present

## 2020-03-29 DIAGNOSIS — Z66 Do not resuscitate: Secondary | ICD-10-CM | POA: Diagnosis not present

## 2020-03-29 DIAGNOSIS — A419 Sepsis, unspecified organism: Secondary | ICD-10-CM | POA: Diagnosis not present

## 2020-03-29 DIAGNOSIS — Z515 Encounter for palliative care: Secondary | ICD-10-CM | POA: Diagnosis not present

## 2020-03-29 DIAGNOSIS — R6520 Severe sepsis without septic shock: Secondary | ICD-10-CM | POA: Diagnosis not present

## 2020-03-29 LAB — URINE CULTURE: Culture: >=100000 — AB

## 2020-03-29 NOTE — Progress Notes (Addendum)
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 In the emergency room, patient is hypotensive with blood pressure as low as 70/30. Barely responsive. Sodium 156. Creatinine 2.9. COVID-19 negative. Chest x-ray essentially normal. Head CT with no acute changes. She was admitted to the hospital and treated for sepsis, hypernatremia due to free water deficit, altered mental status.  4/23: Family meeting was held by palliative care team and decided to change to comfort care and pursue residential hospice.   03/29/20: Seen and examined.  Awaiting bed at [facility].  Assessment/Plan: Principal Problem:   Severe sepsis ([place]) Active Problems:   Acute metabolic encephalopathy   AKI (acute kidney injury) ([place])   Hypernatremia   Encounter for end of life care   DNR (do not resuscitate)   Terminal care   Pressure injury of skin   Hypovolemic hypernatremia Acute metabolic encephalopathy in the setting of underlying advanced dementia Acute kidney injury Hypertension Advanced physical debility   Plan: As per palliative care discussion with family, comfort care measures. Discontinued all invasive testings, blood draws. All symptom control medications available. We will continue to provide end-of-life care while she is in the hospital under our care.  Pending bed placement  at [facility].  DVT prophylaxis: Comfort care Code Status: Comfort care    Dispo: The patient is from: SNF  Anticipated d/c is to: [place] home  Anticipated d/c date QV:ZDGL 24 hours once bed is available.          Consultants:   Palliative medicine  Procedures:   None  Antimicrobials:   Stopped all antibiotics.    Objective: Vitals:   03/27/20 1157 03/28/20 0432 03/28/20 0912 03/28/20 2111  BP:  128/87 128/69 (!) 156/48  Pulse:  62 64 64  Resp: 15  20 16   Temp:  98.2 F (36.8 C) 98.3 F (36.8 C) 98.3 F (36.8 C)  TempSrc: Oral Oral  Oral  SpO2:  100% 99% 100%  Weight:      Height:        Intake/Output Summary (Last 24 hours) at 03/29/2020 1411 Last data filed at 03/29/2020 0005 Gross per 24 hour  Intake 0 ml  Output 500 ml  Net -500 ml   Filed Weights   03/26/20 1824 03/27/20 0125  Weight: 77.1 kg 66 kg    Exam:  . General: 86 y.o. year-old female chronically ill-appearing.  . Cardiovascular: Regular rate and rhythm  . Abdomen: Soft nontender non distended . Neuro: Sleepy and lethargic.  Data Reviewed: CBC: Recent Labs  Lab 03/26/20 1833 03/27/20 0826  WBC 11.4* 14.7*  NEUTROABS 8.9*  --   HGB 12.9 10.3*  HCT 40.6 32.1*  MCV 89.4 88.4  PLT 385 875   Basic Metabolic Panel: Recent Labs  Lab 03/26/20 1833 03/27/20 0146 03/27/20 0615 03/27/20 0826  NA 154* 160* 161* 160*  K 4.7 4.0 3.9 3.8  CL 117* 124* 126* 127*  CO2 21* 22 22 24   GLUCOSE 246* 185* 164*  163*  BUN 70* 71* 70* 68*  CREATININE 2.90* 2.55* 2.42* 2.26*  CALCIUM 11.3* 10.3 10.4* 10.1   GFR: Estimated Creatinine Clearance: 14 mL/min (A) (by C-G formula based on SCr of 2.26 mg/dL (H)). Liver Function Tests: Recent Labs  Lab 03/26/20 1833  AST 37  ALT 32  ALKPHOS 109  BILITOT 0.6  PROT 7.5  ALBUMIN 3.8   No results for input(s): LIPASE, AMYLASE in the last 168 hours. No results for input(s): AMMONIA in  the last 168 hours. Coagulation Profile: Recent Labs  Lab 03/26/20 2232  INR 1.4*   Cardiac Enzymes: No results for input(s): CKTOTAL, CKMB, CKMBINDEX, TROPONINI in the last 168 hours. BNP (last 3 results) No results for input(s): PROBNP in the last 8760 hours. HbA1C: No results for input(s): HGBA1C in the last 72 hours. CBG: Recent Labs  Lab 03/26/20 1746 03/27/20 0800 03/27/20 1152  GLUCAP 215* 136* 148*   Lipid Profile: No results for input(s): CHOL, HDL, LDLCALC, TRIG, CHOLHDL, LDLDIRECT in the last 72 hours. Thyroid Function Tests: No results for input(s): TSH, T4TOTAL, FREET4, T3FREE, THYROIDAB in the last 72 hours. Anemia Panel: No results for input(s): VITAMINB12, FOLATE, FERRITIN, TIBC, IRON, RETICCTPCT in the last 72 hours. Urine analysis:    Component Value Date/Time   COLORURINE AMBER (A) 03/26/2020 2117   APPEARANCEUR CLOUDY (A) 03/26/2020 2117   LABSPEC 1.014 03/26/2020 2117   PHURINE 6.0 03/26/2020 2117   GLUCOSEU NEGATIVE 03/26/2020 2117   HGBUR SMALL (A) 03/26/2020 2117   BILIRUBINUR NEGATIVE 03/26/2020 2117   [place] NEGATIVE 03/26/2020 2117   PROTEINUR 100 (A) 03/26/2020 2117   UROBILINOGEN 0.2 02/15/2014 1403   NITRITE NEGATIVE 03/26/2020 2117   LEUKOCYTESUR NEGATIVE 03/26/2020 2117   Sepsis Labs: @LABRCNTIP (procalcitonin:4,lacticidven:4)  ) Recent Results (from the past 240 hour(s))  Blood Culture (routine x 2)     Status: None (Preliminary result)   Collection Time: 03/26/20  6:20 PM   Specimen: BLOOD  Result Value Ref Range Status   Specimen Description BLOOD RIGHT ANTECUBITAL  Final   Special Requests   Final    BOTTLES DRAWN AEROBIC AND ANAEROBIC Blood Culture adequate volume   Culture   Final    NO GROWTH 3 DAYS Performed at [facility], [place] [address] [place], [place] [address]    Report Status PENDING  Incomplete  Blood Culture (routine x 2)     Status: None (Preliminary result)   Collection Time: 03/26/20  6:30 PM    Specimen: BLOOD RIGHT ARM  Result Value Ref Range Status   Specimen Description BLOOD RIGHT ARM  Final   Special Requests   Final    BOTTLES DRAWN AEROBIC ONLY Blood Culture results may not be optimal due to an inadequate volume of blood received in culture bottles   Culture   Final    NO GROWTH 3 DAYS Performed at [facility], [place] [address], [place], [place] [address]    Report Status PENDING  Incomplete  Respiratory Panel by RT PCR (Flu A&B, Covid) - Nasopharyngeal Swab     Status: None   Collection Time: 03/26/20  8:30 PM   Specimen: Nasopharyngeal Swab  Result Value Ref Range Status   SARS Coronavirus 2 by RT PCR NEGATIVE NEGATIVE Final    Comment: (NOTE) SARS-CoV-2 target nucleic acids are NOT DETECTED. The SARS-CoV-2 RNA is generally detectable in upper respiratoy specimens during the acute phase of infection. The lowest concentration of SARS-CoV-2 viral copies this assay can detect  is 131 copies/mL. A negative result does not preclude SARS-Cov-2 infection and should not be used as the sole basis for treatment or other patient management decisions. A negative result may occur with  improper specimen collection/handling, submission of specimen other than nasopharyngeal swab, presence of viral mutation(s) within the areas targeted by this assay, and inadequate number of viral copies (<131 copies/mL). A negative result must be combined with clinical observations, patient history, and epidemiological information. The expected result is Negative. Fact Sheet for Patients:  PinkCheek.be Fact Sheet for Healthcare Providers:  GravelBags.it This test is not yet ap proved or cleared by the [place] FDA and  has been authorized for detection and/or diagnosis of SARS-CoV-2 by FDA under an Emergency Use Authorization (EUA). This EUA will remain  in effect (meaning this test can be used) for the duration of the COVID-19  declaration under Section 564(b)(1) of the Act, 21 U.S.C. section 360bbb-3(b)(1), unless the authorization is terminated or revoked sooner.    Influenza A by PCR NEGATIVE NEGATIVE Final   Influenza B by PCR NEGATIVE NEGATIVE Final    Comment: (NOTE) The Xpert Xpress SARS-CoV-2/FLU/RSV assay is intended as an aid in  the diagnosis of influenza from Nasopharyngeal swab specimens and  should not be used as a sole basis for treatment. Nasal washings and  aspirates are unacceptable for Xpert Xpress SARS-CoV-2/FLU/RSV  testing. Fact Sheet for Patients: PinkCheek.be Fact Sheet for Healthcare Providers: GravelBags.it This test is not yet approved or cleared by the [place] FDA and  has been authorized for detection and/or diagnosis of SARS-CoV-2 by  FDA under an Emergency Use Authorization (EUA). This EUA will remain  in effect (meaning this test can be used) for the duration of the  Covid-19 declaration under Section 564(b)(1) of the Act, 21  U.S.C. section 360bbb-3(b)(1), unless the authorization is  terminated or revoked. Performed at [facility], [place] [address], [place], [place] [address]   Urine culture     Status: Abnormal   Collection Time: 03/26/20  9:17 PM   Specimen: In/Out Cath Urine  Result Value Ref Range Status   Specimen Description IN/OUT CATH URINE  Final   Special Requests NONE  Final   Culture (A)  Final    >=100,000 COLONIES/mL KLEBSIELLA PNEUMONIAE 80,000 COLONIES/mL VIRIDANS STREPTOCOCCUS Standardized susceptibility testing for this organism is not available. Performed at [facility], [place] [address] [place], [place] [address]    Report Status 03/29/2020 FINAL  Final   Organism ID, Bacteria KLEBSIELLA PNEUMONIAE (A)  Final      Susceptibility   Klebsiella pneumoniae - MIC*    AMPICILLIN RESISTANT Resistant     CEFAZOLIN <=4 SENSITIVE Sensitive     CEFTRIAXONE <=1 SENSITIVE Sensitive       CIPROFLOXACIN <=0.25 SENSITIVE Sensitive     GENTAMICIN <=1 SENSITIVE Sensitive     IMIPENEM <=0.25 SENSITIVE Sensitive     NITROFURANTOIN <=16 SENSITIVE Sensitive     TRIMETH/SULFA <=20 SENSITIVE Sensitive     AMPICILLIN/SULBACTAM 4 SENSITIVE Sensitive     PIP/TAZO <=4 SENSITIVE Sensitive     * >=100,000 COLONIES/mL KLEBSIELLA PNEUMONIAE  MRSA PCR Screening     Status: Abnormal   Collection Time: 03/27/20  1:40 AM   Specimen: Nasal Mucosa; Nasopharyngeal  Result Value Ref Range Status   MRSA by PCR POSITIVE (A) NEGATIVE Final    Comment:        The GeneXpert MRSA Assay (FDA approved for NASAL specimens only), is one  component of a comprehensive MRSA colonization surveillance program. It is not intended to diagnose MRSA infection nor to guide or monitor treatment for MRSA infections. RESULT CALLED TO, READ BACK BY AND VERIFIED WITH: Thomes Lolling 0315 03/27/2020 Mena Goes Performed at [facility], [place] [address], [place], [place] [address]       Studies: No results found.  Scheduled Meds: . mometasone-formoterol  2 puff Inhalation BID    Continuous Infusions:   LOS: 3 days     Kayleen Memos, MD [facility] Pager [phone]  If 7PM-7AM, please contact night-coverage [email or web address] Password [facility] 03/29/2020, 2:11 PM

## 2020-03-29 NOTE — Progress Notes (Signed)
Palliative Medicine Inpatient Follow Up Note   HPI: Katherine [name] a 86 y.o.femaleadmitted 03/26/20 with acute metabolic encephalopathy, AKI secondary to dehydration, and hypovolemic hypernatremia in the setting of advanced dementia.  PMH significant for CAD status post CABG x4, chronic diastolic CHF, obesity (BMI 33.20), hypertension, hyperlipidemia, insulin-dependent type 2 diabetes, history of severe aortic stenosis status post TAVR, and hospital admission in March 2021 for GI bleed secondary to a large submucosal gastric mass with surface ulceration for which she was evaluated by general surgery not felt to be a candidate for aggressive interventions.  Palliative care was asked to consult for ongoing goals of care conversations in the setting of advanced dementia.   Today's Discussion (03/29/2020): Chart reviewed. Patient appears comfortable this morning. She does not endorse any pain or nausea upon conversation.  Plan for transition to [facility].  Patients daughter, [name] called to provide a daily update.  Discussed the importance of continued conversation with family and their  medical providers regarding overall plan of care and treatment options, ensuring decisions are within the context of the patients values and GOCs.  Questions and concerns addressed   Vital Signs Vitals:   03/28/20 0912 03/28/20 2111  BP: 128/69 (!) 156/48  Pulse: 64 64  Resp: 20 16  Temp: 98.3 F (36.8 C) 98.3 F (36.8 C)  SpO2: 99% 100%    Intake/Output Summary (Last 24 hours) at 03/29/2020 0825 Last data filed at 03/29/2020 0005 Gross per 24 hour  Intake 0 ml  Output 900 ml  Net -900 ml   Last Weight  Most recent update: 03/27/2020  1:28 AM   Weight  66 kg (145 lb 8.1 oz)           SUMMARY OF RECOMMENDATIONS   DNAR/DNI  Pending [facility] Bed acceptance  Comfort oriented care  Code Status/Advance Care Planning:  DNR  Symptom Management:  Dyspnea: Pain:            - Fentanyl 61mcg Q1H PRN                 - Continue inhaler Fever:                 - Tylenol 650mg  PO/PR Q6H PRN                 - Heat Pack PRN Anxiety:                 - Lorazepam 1mg   IV  Q1H PRN Nausea:                 - Zofran 4mg  PO/IV Q6H PRN             Secretions:                 - Glycopyrrolate 0.2mg  IV Q4H PRN Dry Eyes:                 - Artificial Tears PRN Xerostomia:                 - Biotene 45ml PRN                 - BID oral care Urinary Retention:                 - Maintain foley  Constipation:                 - Bisacodyl 10mg  PR PRN QDay Spiritual:                 -  Chaplain consult  Time Spent: 15 Greater than 50% of the time was spent in counseling and coordination of care ______________________________________________________________________________________ [place] Team Team Cell Phone: [phone] Please utilize secure chat with additional questions, if there is no response within 30 minutes please call the above phone number  Palliative Medicine Team providers are available by phone from 7am to 7pm daily and can be reached through the team cell phone.  Should this patient require assistance outside of these hours, please call the patient's attending physician.

## 2020-03-30 DIAGNOSIS — R6520 Severe sepsis without septic shock: Secondary | ICD-10-CM | POA: Diagnosis not present

## 2020-03-30 DIAGNOSIS — A419 Sepsis, unspecified organism: Secondary | ICD-10-CM | POA: Diagnosis not present

## 2020-03-30 NOTE — TOC Transition Note (Addendum)
Transition of Care [facility]) - CM/SW Discharge Note Marvetta Gibbons RN,BSN Transitions of Care Unit 4NP (non trauma) - RN Case Manager [phone]   Patient Details  Name: Katherine [name] MRN: [id number] Date of Birth: May 31, 1928  [facility] of [facility] CM/SW Contact:  Dawayne Patricia, RN Phone Number: 03/30/2020, 11:24 AM   Clinical Narrative:    Notified this AM by Gar Ponto with [facility] that pt has bed available today for [facility]. MD has been notified and d/c order placed- d/c summary pending- will fax to [place] once available- to fax#- [phone] as per request.  Pt is pending transport once family has completed needed paperwork at [place] to let CM know once paperwork is completed and then [facility] will be arranged to transport pt to [place] DNR and transport paper work has been placed on shadow chart.   Number for RN to call report to [facility] is [phone]  Update 1353- have received notification from [facility] with [place] that family has completed needed paperwork at [facility] and they are ready for patient to be transported. Bedside RN notified that report can be called to [place] will be arranged.  PTAR called and transport scheduled for next available. Paperwork and DNR on chart.     Final next level of care: [place] Barriers to Discharge: Barriers Resolved   Patient Goals and CMS Choice Patient states their goals for this hospitalization and ongoing recovery are:: comfort care CMS Medicare.gov Compare Post Acute Care list provided to:: Patient Represenative (must comment) Choice offered to / list presented to : Adult Children  Discharge Placement               [facility]        Discharge Plan and Services     Post Acute Care Choice: Hospice                               Social Determinants of Health (SDOH) Interventions     Readmission Risk  Interventions Readmission Risk Prevention Plan 03/30/2020  Transportation Screening Complete  PCP or Specialist Appt within 3-5 Days (No Data)  [place] or [place] Complete  Social Work Consult for [place] Planning/Counseling Complete  Palliative Care Screening Complete  Medication Review Press photographer) Complete  Some recent data might be hidden

## 2020-03-30 NOTE — Discharge Summary (Signed)
Discharge Summary  Katherine [name] YSA:[id number] DOB: 1927-12-06  PCP: Celene Squibb, MD  Admit date: 03/26/2020 Discharge date: 03/30/2020  Time spent: 35 minutes  Recommendations for Outpatient Follow-up:  1. Continue hospice care.  Discharge Diagnoses:  Active Hospital Problems   Diagnosis Date Noted  . Severe sepsis ([place]) 03/27/2020  . Pressure injury of skin 03/28/2020  . AKI (acute kidney injury) ([place]) 03/27/2020  . Hypernatremia 03/27/2020  . Encounter for end of life care 03/27/2020  . DNR (do not resuscitate)   . Terminal care   . Acute metabolic encephalopathy 32/35/5732    Resolved Hospital Problems  No resolved problems to display.    Discharge Condition: Stable.  Diet recommendation: Pleasure feedings.  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 In the emergency room, patient is hypotensive with blood pressure as low as 70/30. Barely responsive. Sodium 156. Creatinine 2.9. COVID-19 negative. Chest x-ray essentially normal. Head CT with no acute changes. She was admitted to the hospital and treated for sepsis, hypernatremia due to free water deficit, altered mental status.  Family meeting was held by palliative care team and decided to change to comfort care and pursue  residential hospice.  03/30/20: Seen and examined.  Appears comfortable.  She has no complaints.  Hospital Course:  Principal Problem:   Severe sepsis ([place]) Active Problems:   Acute metabolic encephalopathy   AKI (acute kidney injury) ([place])   Hypernatremia   Encounter for end of life care   DNR (do not resuscitate)   Terminal care   Pressure injury of skin   Hypovolemic hypernatremia Acute metabolic encephalopathy in the setting of underlying advanced dementia Acute kidney injury Hypertension Advanced physical debility   Plan: As per palliative care discussion with family, comfort care measures. Discontinuedall invasive testings, blood draws.  Accepted at [facility].     Code Status:DNR/Comfort care    Consultants:  Palliative medicine  Procedures:  None     Discharge Exam: BP (!) 167/50 (BP Location: Left Arm)   Pulse 68   Temp (!) 97 F (36.1 C)   Resp 20   Ht 5' (1.524 m)   Wt 66 kg   SpO2 96%   BMI 28.42 kg/m  . General: 86 y.o. year-old female chronic ill-appearing.  In no acute distress. . Cardiovascular: Regular rate and rhythm. Marland Kitchen Respiratory: Clear to auscultation  . Abdomen: Soft nontender nondistended  . Musculoskeletal: No lower extremity edema  Discharge Instructions You were cared for by a hospitalist during your hospital stay. If you have any questions about your discharge medications or the care you received while you were in the hospital after you are discharged, you can call the unit and asked to speak with the hospitalist on call if the hospitalist that took care of you is not available. Once you are discharged, your primary care physician  will handle any further medical issues. Please note that NO REFILLS for any discharge medications will be authorized once you are discharged, as it is imperative that you return to your primary care physician (or establish a relationship with a primary care physician if you do not have  one) for your aftercare needs so that they can reassess your need for medications and monitor your lab values.   Allergies as of 03/30/2020      Reactions   Tape Other (See Comments)   SKIN IS VERY THIN AND WILL TEAR EASILY!!      Medication List    STOP taking these medications   acetaminophen 500 MG tablet Commonly known as: TYLENOL   albuterol 108 (90 Base) MCG/ACT inhaler Commonly known as: VENTOLIN HFA   ALPRAZolam 0.25 MG tablet Commonly known as: XANAX   ALPRAZolam 0.5 MG tablet Commonly known as: XANAX   atorvastatin 80 MG tablet Commonly known as: LIPITOR   Biofreeze 4 % Gel Generic drug: Menthol (Topical Analgesic)   budesonide-formoterol 80-4.5 MCG/ACT inhaler Commonly known as: SYMBICORT   cetirizine 10 MG tablet Commonly known as: ZYRTEC   CLEAR EYES OP   clopidogrel 75 MG tablet Commonly known as: PLAVIX   donepezil 10 MG tablet Commonly known as: ARICEPT   esomeprazole 20 MG capsule Commonly known as: NEXIUM   furosemide 20 MG tablet Commonly known as: LASIX   hydrALAZINE 50 MG tablet Commonly known as: APRESOLINE   insulin detemir 100 UNIT/ML injection Commonly known as: LEVEMIR   ipratropium 0.03 % nasal spray Commonly known as: ATROVENT   Linzess 145 MCG Caps capsule Generic drug: linaclotide   lip balm ointment   lisinopril 10 MG tablet Commonly known as: ZESTRIL   loperamide 2 MG capsule Commonly known as: IMODIUM   mirtazapine 15 MG tablet Commonly known as: REMERON   multivitamin with minerals Tabs tablet   Namenda XR 28 MG Cp24 24 hr capsule Generic drug: memantine   nebivolol 10 MG tablet Commonly known as: BYSTOLIC   nystatin powder Generic drug: nystatin   potassium chloride 20 MEQ packet Commonly known as: KLOR-CON   sertraline 25 MG tablet Commonly known as: ZOLOFT      Allergies  Allergen Reactions  . Tape Other (See Comments)    SKIN IS VERY THIN AND WILL TEAR EASILY!!      The results of  significant diagnostics from this hospitalization (including imaging, microbiology, ancillary and laboratory) are listed below for reference.    Significant Diagnostic Studies: CT Head Wo Contrast  Result Date: 03/26/2020 CLINICAL DATA:  Altered mental status (AMS), unclear cause. Additional history provided: Patient presents via ambulance 4 lethargy. EXAM: CT HEAD WITHOUT CONTRAST TECHNIQUE: Contiguous axial images were obtained from the base of the skull through the vertex without intravenous contrast. COMPARISON:  Head CT 06/26/2018 FINDINGS: Brain: There is no evidence of acute intracranial hemorrhage, intracranial mass, midline shift or extra-axial fluid collection.No demarcated cortical infarction. Moderate patchy and confluent hypoattenuation within the cerebral white matter is nonspecific, but consistent with chronic small vessel ischemic disease. Findings are similar to prior head CT 06/26/2018. Stable, moderate generalized parenchymal atrophy. Vascular: No hyperdense vessel. Atherosclerotic calcifications Skull: Normal. Negative for fracture or focal lesion. Sinuses/Orbits: Visualized orbits demonstrate no acute abnormality. Mild ethmoid sinus mucosal thickening. Frothy secretions within the right sphenoid sinus. No significant mastoid effusion. IMPRESSION: 1. No evidence of acute intracranial abnormality. 2. Stable moderate generalized parenchymal atrophy and chronic small vessel ischemic disease. 3. Ethmoid and right  sphenoid sinusitis. Electronically Signed   By: Kellie Simmering DO   On: 03/26/2020 22:19   US RENAL  Result Date: 03/27/2020 CLINICAL DATA:  Acute renal failure. EXAM: RENAL / URINARY TRACT ULTRASOUND COMPLETE COMPARISON:  CT 04/10/2015.  Renal ultrasound 01/06/2015. FINDINGS: Right Kidney: Renal measurements: 12.1 x 4.5 x 4.9 cm = volume: 139.7 mL . Echogenicity within normal limits. 3.7 x 2.2 x 2.5 cm solid mass again noted in the right kidney. Although this may represent a renal  cell carcinoma. This appears stable from prior CT of 04/10/2015 and prior renal ultrasound of 01/06/2015. 3.3 x 3.4 x 3.4 cm simple cyst again noted. No hydronephrosis visualized. Left Kidney: Renal measurements: 10.5 x 5.1 x 3.8 cm = volume: 105.9 mL. Echogenicity within normal limits. 1.7 x 1.5 x 1.2 cm simple cyst. 1.9 x 1.9 x 2.1 cm simple cyst. Similar findings noted on prior exams. No hydronephrosis visualized. Bladder: Not visualized. Other: None. IMPRESSION: 1. 3.7 x 2.2 x 2.5 cm right renal solid mass again noted. Although this may represent a renal cell carcinoma, this appears stable from prior CT of 04/10/2015 and prior renal ultrasound of 01/06/2015. 2.  Simple bilateral renal cysts are again noted. 3. No acute abnormality. No hydronephrosis. Bladder not visualized. Electronically Signed   By: Marcello Moores  Register   On: 03/27/2020 05:43   DG Chest Port 1 View  Result Date: 03/26/2020 CLINICAL DATA:  Shortness of breath and lethargy. EXAM: PORTABLE CHEST 1 VIEW COMPARISON:  Radiograph 06/26/2018 FINDINGS: Post median sternotomy and TAVR. Cardiomegaly is similar to prior exam allowing for differences in technique. Aortic tortuosity and atherosclerosis. Overall low lung volumes. Streaky bibasilar atelectasis or scarring. No pulmonary edema, confluent airspace disease, large pleural effusion or pneumothorax. Calcified granuloma at the right lung apex. IMPRESSION: 1. Low lung volumes with streaky bibasilar atelectasis or scarring. 2. Stable cardiomegaly. Electronically Signed   By: Keith Rake M.D.   On: 03/26/2020 19:15    Microbiology: Recent Results (from the past 240 hour(s))  Blood Culture (routine x 2)     Status: None (Preliminary result)   Collection Time: 03/26/20  6:20 PM   Specimen: BLOOD  Result Value Ref Range Status   Specimen Description BLOOD RIGHT ANTECUBITAL  Final   Special Requests   Final    BOTTLES DRAWN AEROBIC AND ANAEROBIC Blood Culture adequate volume   Culture    Final    NO GROWTH 4 DAYS Performed at [facility], [address] [place], [place] [address]    Report Status PENDING  Incomplete  Blood Culture (routine x 2)     Status: None (Preliminary result)   Collection Time: 03/26/20  6:30 PM   Specimen: BLOOD RIGHT ARM  Result Value Ref Range Status   Specimen Description BLOOD RIGHT ARM  Final   Special Requests   Final    BOTTLES DRAWN AEROBIC ONLY Blood Culture results may not be optimal due to an inadequate volume of blood received in culture bottles   Culture   Final    NO GROWTH 4 DAYS Performed at [facility], [place] [address], [place], [place] [address]    Report Status PENDING  Incomplete  Respiratory Panel by RT PCR (Flu A&B, Covid) - Nasopharyngeal Swab     Status: None   Collection Time: 03/26/20  8:30 PM   Specimen: Nasopharyngeal Swab  Result Value Ref Range Status   SARS Coronavirus 2 by RT PCR NEGATIVE NEGATIVE Final  Comment: (NOTE) SARS-CoV-2 target nucleic acids are NOT DETECTED. The SARS-CoV-2 RNA is generally detectable in upper respiratoy specimens during the acute phase of infection. The lowest concentration of SARS-CoV-2 viral copies this assay can detect is 131 copies/mL. A negative result does not preclude SARS-Cov-2 infection and should not be used as the sole basis for treatment or other patient management decisions. A negative result may occur with  improper specimen collection/handling, submission of specimen other than nasopharyngeal swab, presence of viral mutation(s) within the areas targeted by this assay, and inadequate number of viral copies (<131 copies/mL). A negative result must be combined with clinical observations, patient history, and epidemiological information. The expected result is Negative. Fact Sheet for Patients:  PinkCheek.be Fact Sheet for Healthcare Providers:  GravelBags.it This test is not yet ap proved or  cleared by the [place] FDA and  has been authorized for detection and/or diagnosis of SARS-CoV-2 by FDA under an Emergency Use Authorization (EUA). This EUA will remain  in effect (meaning this test can be used) for the duration of the COVID-19 declaration under Section 564(b)(1) of the Act, 21 U.S.C. section 360bbb-3(b)(1), unless the authorization is terminated or revoked sooner.    Influenza A by PCR NEGATIVE NEGATIVE Final   Influenza B by PCR NEGATIVE NEGATIVE Final    Comment: (NOTE) The Xpert Xpress SARS-CoV-2/FLU/RSV assay is intended as an aid in  the diagnosis of influenza from Nasopharyngeal swab specimens and  should not be used as a sole basis for treatment. Nasal washings and  aspirates are unacceptable for Xpert Xpress SARS-CoV-2/FLU/RSV  testing. Fact Sheet for Patients: PinkCheek.be Fact Sheet for Healthcare Providers: GravelBags.it This test is not yet approved or cleared by the [place] FDA and  has been authorized for detection and/or diagnosis of SARS-CoV-2 by  FDA under an Emergency Use Authorization (EUA). This EUA will remain  in effect (meaning this test can be used) for the duration of the  Covid-19 declaration under Section 564(b)(1) of the Act, 21  U.S.C. section 360bbb-3(b)(1), unless the authorization is  terminated or revoked. Performed at [facility], [place] [address] [place], [place] [address]   Urine culture     Status: Abnormal   Collection Time: 03/26/20  9:17 PM   Specimen: In/Out Cath Urine  Result Value Ref Range Status   Specimen Description IN/OUT CATH URINE  Final   Special Requests NONE  Final   Culture (A)  Final    >=100,000 COLONIES/mL KLEBSIELLA PNEUMONIAE 80,000 COLONIES/mL VIRIDANS STREPTOCOCCUS Standardized susceptibility testing for this organism is not available. Performed at [facility], [place] [address], [place], [place] [address]    Report  Status 03/29/2020 FINAL  Final   Organism ID, Bacteria KLEBSIELLA PNEUMONIAE (A)  Final      Susceptibility   Klebsiella pneumoniae - MIC*    AMPICILLIN RESISTANT Resistant     CEFAZOLIN <=4 SENSITIVE Sensitive     CEFTRIAXONE <=1 SENSITIVE Sensitive     CIPROFLOXACIN <=0.25 SENSITIVE Sensitive     GENTAMICIN <=1 SENSITIVE Sensitive     IMIPENEM <=0.25 SENSITIVE Sensitive     NITROFURANTOIN <=16 SENSITIVE Sensitive     TRIMETH/SULFA <=20 SENSITIVE Sensitive     AMPICILLIN/SULBACTAM 4 SENSITIVE Sensitive     PIP/TAZO <=4 SENSITIVE Sensitive     * >=100,000 COLONIES/mL KLEBSIELLA PNEUMONIAE  MRSA PCR Screening     Status: Abnormal   Collection Time: 03/27/20  1:40 AM   Specimen: Nasal Mucosa; Nasopharyngeal  Result Value  Ref Range Status   MRSA by PCR POSITIVE (A) NEGATIVE Final    Comment:        The GeneXpert MRSA Assay (FDA approved for NASAL specimens only), is one component of a comprehensive MRSA colonization surveillance program. It is not intended to diagnose MRSA infection nor to guide or monitor treatment for MRSA infections. RESULT CALLED TO, READ BACK BY AND VERIFIED WITH: Thomes Lolling 0315 03/27/2020 Mena Goes Performed at [facility], [place] [address], [place], [place] [address]      Labs: Basic Metabolic Panel: Recent Labs  Lab 03/26/20 1833 03/27/20 0146 03/27/20 0615 03/27/20 0826  NA 154* 160* 161* 160*  K 4.7 4.0 3.9 3.8  CL 117* 124* 126* 127*  CO2 21* 22 22 24   GLUCOSE 246* 185* 164* 163*  BUN 70* 71* 70* 68*  CREATININE 2.90* 2.55* 2.42* 2.26*  CALCIUM 11.3* 10.3 10.4* 10.1   Liver Function Tests: Recent Labs  Lab 03/26/20 1833  AST 37  ALT 32  ALKPHOS 109  BILITOT 0.6  PROT 7.5  ALBUMIN 3.8   No results for input(s): LIPASE, AMYLASE in the last 168 hours. No results for input(s): AMMONIA in the last 168 hours. CBC: Recent Labs  Lab 03/26/20 1833 03/27/20 0826  WBC 11.4* 14.7*  NEUTROABS 8.9*  --   HGB 12.9 10.3*  HCT  40.6 32.1*  MCV 89.4 88.4  PLT 385 280   Cardiac Enzymes: No results for input(s): CKTOTAL, CKMB, CKMBINDEX, TROPONINI in the last 168 hours. BNP: BNP (last 3 results) Recent Labs    03/26/20 1818  BNP 102.9*    ProBNP (last 3 results) No results for input(s): PROBNP in the last 8760 hours.  CBG: Recent Labs  Lab 03/26/20 1746 03/27/20 0800 03/27/20 1152  GLUCAP 215* 136* 148*       Signed:  Kayleen Memos, MD [facility] 03/30/2020, 11:34 AM

## 2020-03-30 NOTE — Progress Notes (Addendum)
[employer]ufacturing [employer] [facility]) Hospital Liaison Note:  Spoke to patient daughters [name] and [place]) separately to confirm interest in [facility] and offer a bed. Answered questions and supported family.    Both daughters are agreeable to transfer today with MD approval.  Left message for TOC Steffanie Dunn W.) listed on [place] Team.   [facility] SW will reach out to family to set up appointment to do registration paper work for [employer]. Will update this note once consents/registeration paper work are complete and [employer] is ready to receive patient. Patient will need TOC to arrange transportation.  Please fax discharge summary to [phone].   RN- please call report to [phone].  Thank you,  Gar Ponto, RN [facility] HLT (on AMION) [phone]  AT 1400 [facility] TOC made aware that patient family had completed [employer] paper work and is ready for transport to be called.

## 2020-03-31 ENCOUNTER — Ambulatory Visit: Payer: Medicare Other | Admitting: Podiatry

## 2020-03-31 LAB — CULTURE, BLOOD (ROUTINE X 2)
Culture: NO GROWTH
Culture: NO GROWTH
Special Requests: ADEQUATE

## 2020-04-13 DIAGNOSIS — R600 Localized edema: Secondary | ICD-10-CM | POA: Diagnosis not present

## 2020-04-13 DIAGNOSIS — I130 Hypertensive heart and chronic kidney disease with heart failure and stage 1 through stage 4 chronic kidney disease, or unspecified chronic kidney disease: Secondary | ICD-10-CM | POA: Diagnosis not present

## 2020-04-13 DIAGNOSIS — R944 Abnormal results of kidney function studies: Secondary | ICD-10-CM | POA: Diagnosis not present

## 2020-04-13 DIAGNOSIS — N189 Chronic kidney disease, unspecified: Secondary | ICD-10-CM | POA: Diagnosis not present

## 2020-04-13 DIAGNOSIS — E870 Hyperosmolality and hypernatremia: Secondary | ICD-10-CM | POA: Diagnosis not present

## 2020-04-13 DIAGNOSIS — F5105 Insomnia due to other mental disorder: Secondary | ICD-10-CM | POA: Diagnosis not present

## 2020-04-13 DIAGNOSIS — I5042 Chronic combined systolic (congestive) and diastolic (congestive) heart failure: Secondary | ICD-10-CM | POA: Diagnosis not present

## 2020-04-13 DIAGNOSIS — I350 Nonrheumatic aortic (valve) stenosis: Secondary | ICD-10-CM | POA: Diagnosis not present

## 2020-04-13 DIAGNOSIS — E782 Mixed hyperlipidemia: Secondary | ICD-10-CM | POA: Diagnosis not present

## 2020-04-13 DIAGNOSIS — F0151 Vascular dementia with behavioral disturbance: Secondary | ICD-10-CM | POA: Diagnosis not present

## 2020-04-13 DIAGNOSIS — E1122 Type 2 diabetes mellitus with diabetic chronic kidney disease: Secondary | ICD-10-CM | POA: Diagnosis not present

## 2020-05-05 DEATH — deceased

## 2020-08-07 IMAGING — CT CT HEAD W/O CM
4 series · 16 of 47 positions shown, 18 images · non-contrast
Comparison: Head CT 06/26/2018

CLINICAL DATA: Altered mental status (AMS), unclear cause.
Additional history provided: Patient presents via ambulance 4
lethargy.

EXAM:
CT HEAD WITHOUT CONTRAST
TECHNIQUE: Contiguous axial images were obtained from the base of the skull
through the vertex without intravenous contrast.

[Series 3: head bone · axial · 0.43mm/px · z∈[+1384,+1416]mm · 3 of 82 slices shown]
[im 9/82  bone]
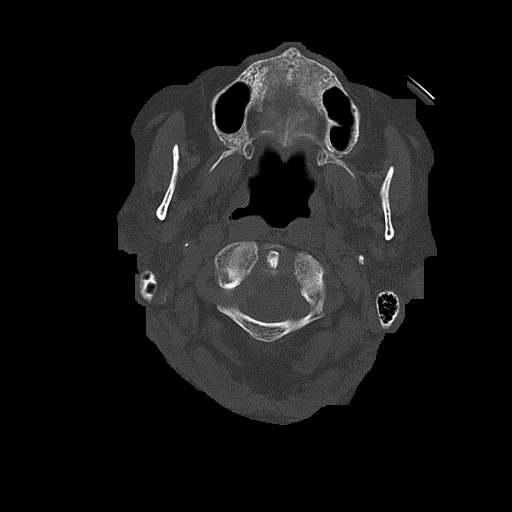
[im 17/82  bone]
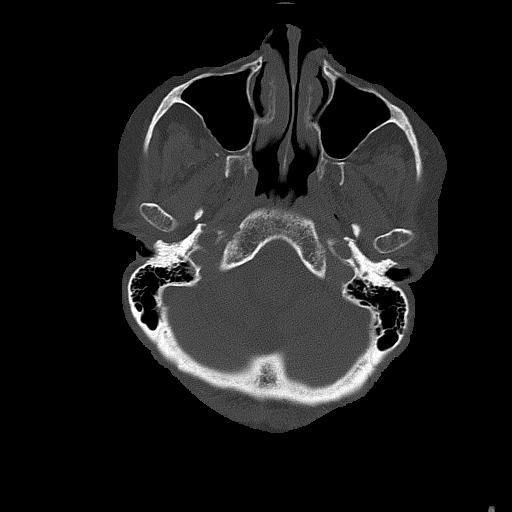
[im 25/82  bone]
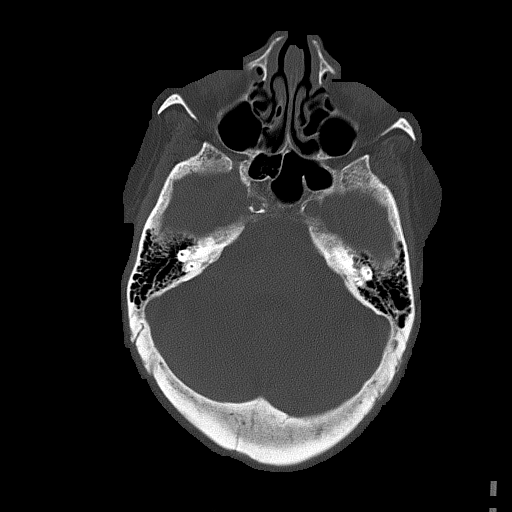

[Series 4: head without · axial · non-contrast · 0.40mm/px · z∈[+1388,+1508]mm · 7 of 33 slices shown, 9 images]
[im 5/33  brain]
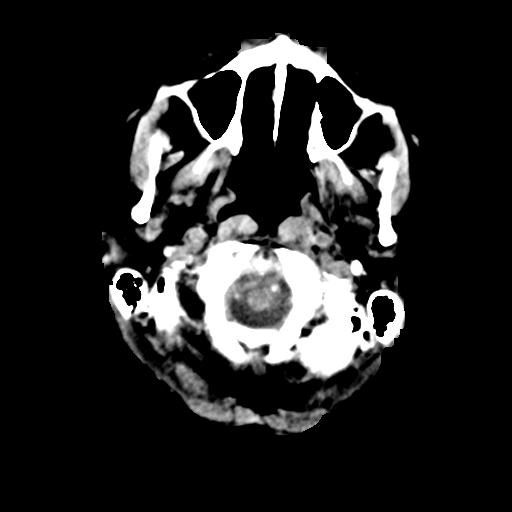
[im 5/33  bone]
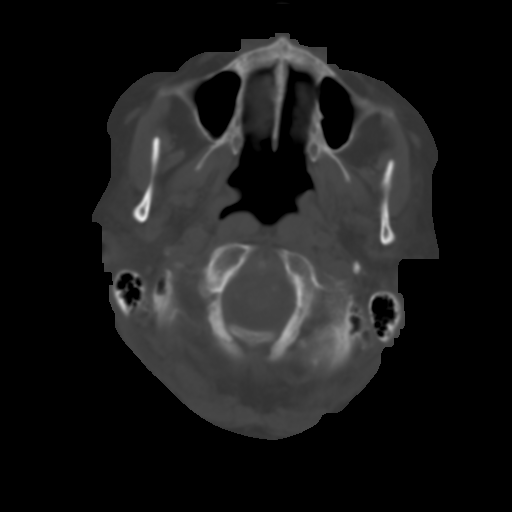
[im 9/33  brain]
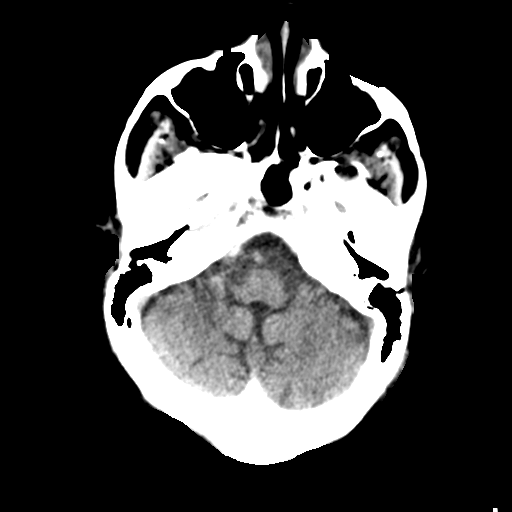
[im 13/33  brain]
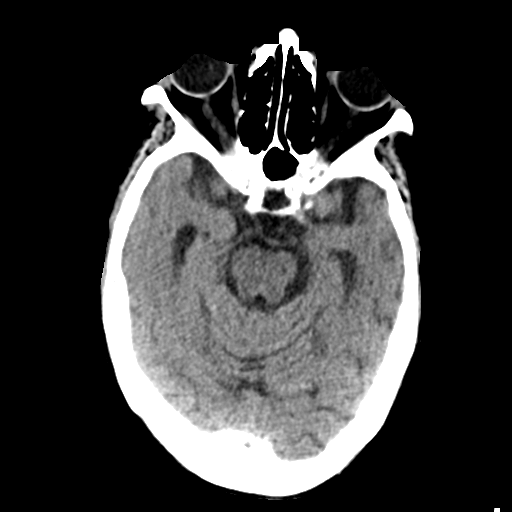
[im 17/33  brain]
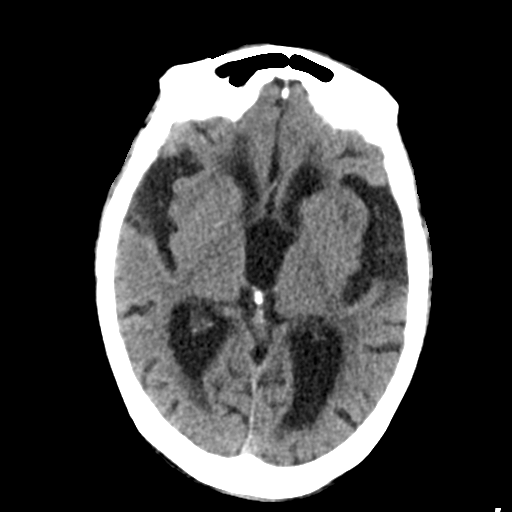
[im 21/33  brain]
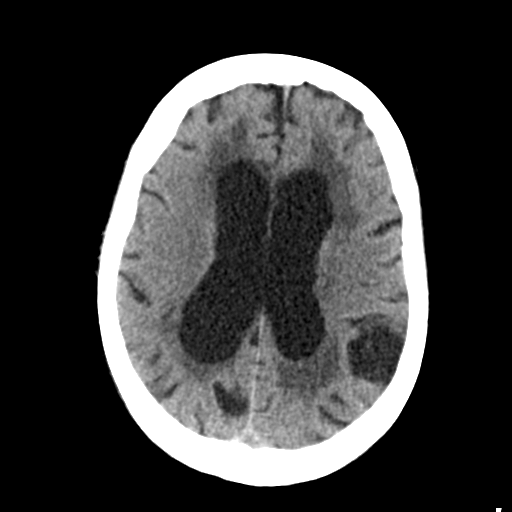
[im 21/33  bone]
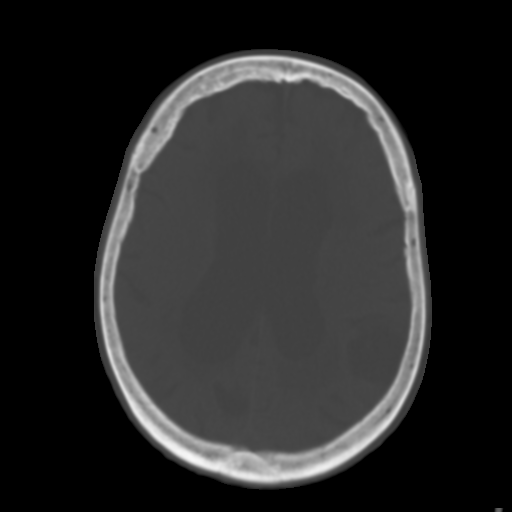
[im 25/33  brain]
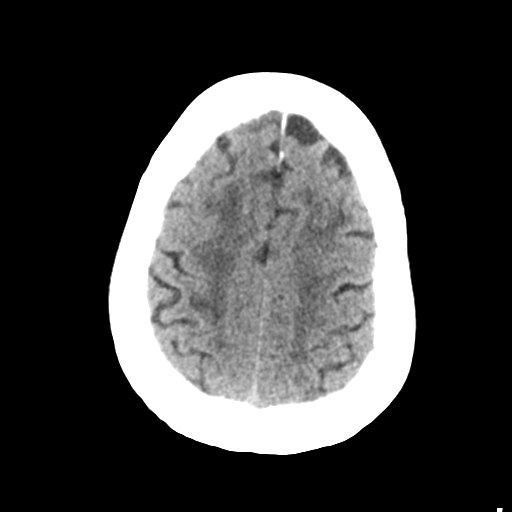
[im 29/33  brain]
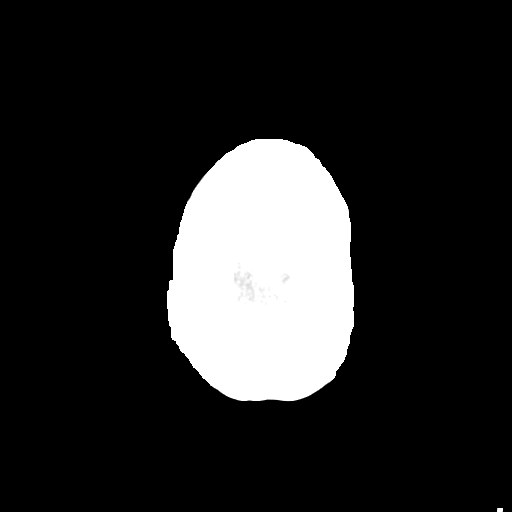

[Series 5: head without cor · coronal · non-contrast · 0.30mm/px · 3 of 67 slices shown]
[im 23/67  brain]
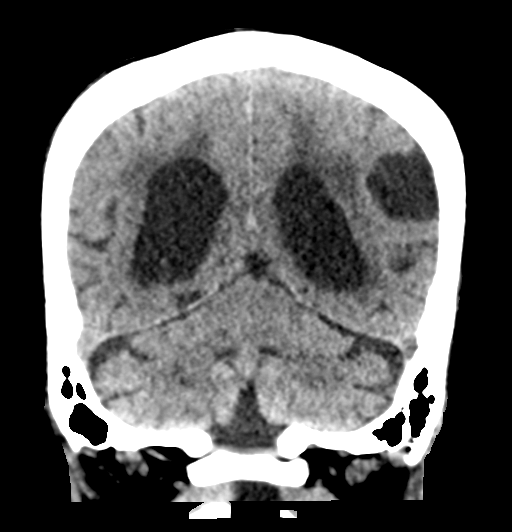
[im 30/67  brain]
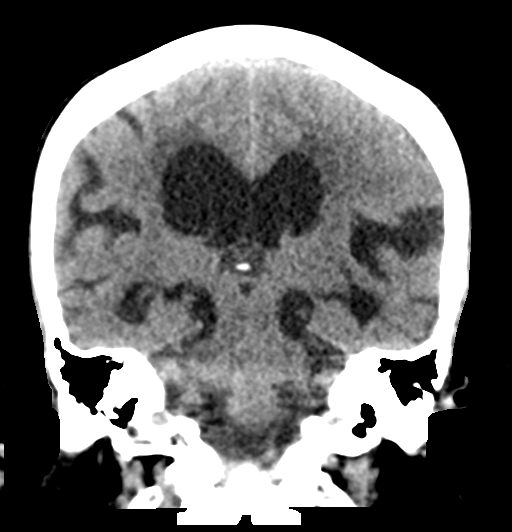
[im 37/67  brain]
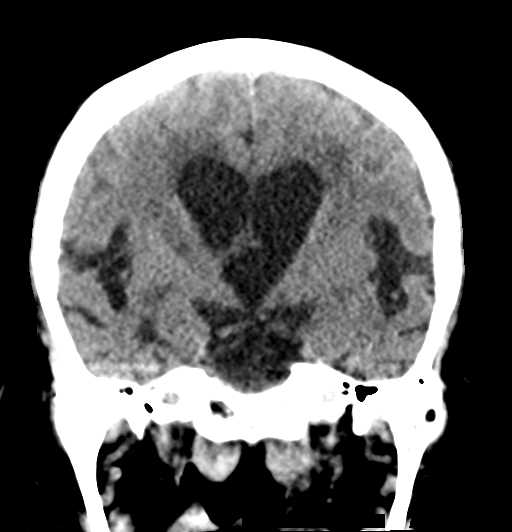

[Series 6: head without sag · sagittal · non-contrast · 0.32mm/px · 3 of 52 slices shown]
[im 18/52  brain]
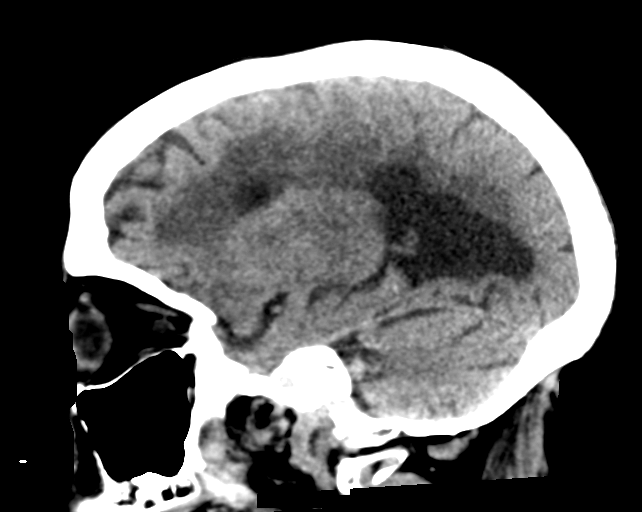
[im 26/52  brain]
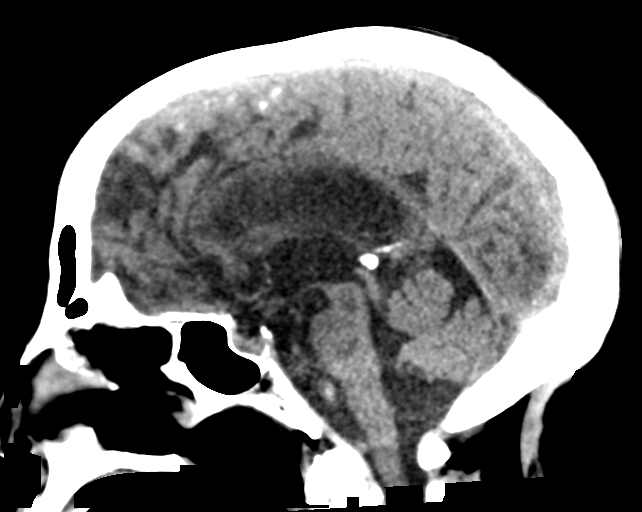
[im 35/52  brain]
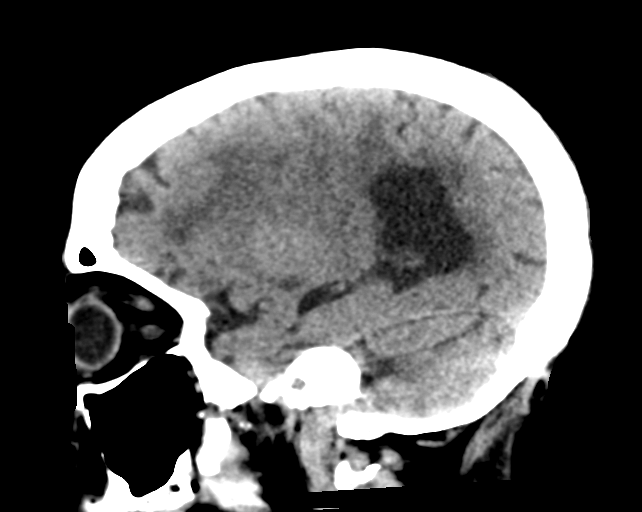

[16 of 47 positions shown; findings below may reference images not displayed]

FINDINGS: Brain: There is no evidence of acute intracranial hemorrhage,
intracranial mass, midline shift or extra-axial fluid collection.No
demarcated cortical infarction. Moderate patchy and confluent
hypoattenuation within the cerebral white matter is nonspecific, but
consistent with chronic small vessel ischemic disease. Findings are
similar to prior head CT 06/26/2018. Stable, moderate generalized
parenchymal atrophy.

Vascular: No hyperdense vessel. Atherosclerotic calcifications

Skull: Normal. Negative for fracture or focal lesion.

Sinuses/Orbits: Visualized orbits demonstrate no acute abnormality.
Mild ethmoid sinus mucosal thickening. Frothy secretions within the
right sphenoid sinus. No significant mastoid effusion.
IMPRESSION: 1. No evidence of acute intracranial abnormality.
2. Stable moderate generalized parenchymal atrophy and chronic small
vessel ischemic disease.
3. Ethmoid and right sphenoid sinusitis.
